# Patient Record
Sex: Female | Born: 1965
Health system: Southern US, Community
[De-identification: ages and names within clinical notes are randomized; demographics above are authoritative.]

## PROBLEM LIST (undated history)

## (undated) DIAGNOSIS — Z9989 Dependence on other enabling machines and devices: Secondary | ICD-10-CM

## (undated) DIAGNOSIS — K219 Gastro-esophageal reflux disease without esophagitis: Secondary | ICD-10-CM

## (undated) DIAGNOSIS — I82812 Embolism and thrombosis of superficial veins of left lower extremities: Secondary | ICD-10-CM

## (undated) DIAGNOSIS — I712 Thoracic aortic aneurysm, without rupture, unspecified: Secondary | ICD-10-CM

## (undated) DIAGNOSIS — I351 Nonrheumatic aortic (valve) insufficiency: Secondary | ICD-10-CM

## (undated) DIAGNOSIS — N289 Disorder of kidney and ureter, unspecified: Secondary | ICD-10-CM

## (undated) DIAGNOSIS — Z9289 Personal history of other medical treatment: Secondary | ICD-10-CM

## (undated) DIAGNOSIS — M179 Osteoarthritis of knee, unspecified: Secondary | ICD-10-CM

## (undated) DIAGNOSIS — G2581 Restless legs syndrome: Secondary | ICD-10-CM

## (undated) DIAGNOSIS — E119 Type 2 diabetes mellitus without complications: Secondary | ICD-10-CM

## (undated) DIAGNOSIS — N390 Urinary tract infection, site not specified: Secondary | ICD-10-CM

## (undated) DIAGNOSIS — R011 Cardiac murmur, unspecified: Secondary | ICD-10-CM

## (undated) DIAGNOSIS — R7301 Impaired fasting glucose: Secondary | ICD-10-CM

## (undated) DIAGNOSIS — Z86711 Personal history of pulmonary embolism: Secondary | ICD-10-CM

## (undated) DIAGNOSIS — Z87442 Personal history of urinary calculi: Secondary | ICD-10-CM

## (undated) DIAGNOSIS — M1710 Unilateral primary osteoarthritis, unspecified knee: Secondary | ICD-10-CM

## (undated) DIAGNOSIS — G4733 Obstructive sleep apnea (adult) (pediatric): Secondary | ICD-10-CM

## (undated) HISTORY — PX: KNEE SURGERY: SHX244

## (undated) HISTORY — PX: DILATION AND EVACUATION: SHX1459

## (undated) HISTORY — PX: OTHER SURGICAL HISTORY: SHX169

## (undated) HISTORY — DX: Osteoarthritis of knee, unspecified: M17.9

## (undated) HISTORY — DX: Personal history of pulmonary embolism: Z86.711

## (undated) HISTORY — DX: Thoracic aortic aneurysm, without rupture, unspecified: I71.20

## (undated) HISTORY — PX: KIDNEY STONE SURGERY: SHX686

## (undated) HISTORY — PX: TONSILLECTOMY: SUR1361

## (undated) HISTORY — DX: Unilateral primary osteoarthritis, unspecified knee: M17.10

## (undated) HISTORY — DX: Thoracic aortic aneurysm, without rupture: I71.2

## (undated) HISTORY — DX: Impaired fasting glucose: R73.01

## (undated) HISTORY — DX: Personal history of other medical treatment: Z92.89

## (undated) HISTORY — DX: Restless legs syndrome: G25.81

## (undated) HISTORY — DX: Nonrheumatic aortic (valve) insufficiency: I35.1

## (undated) HISTORY — DX: Embolism and thrombosis of superficial veins of left lower extremity: I82.812

---

## 1998-03-07 ENCOUNTER — Other Ambulatory Visit: Admission: RE | Admit: 1998-03-07 | Discharge: 1998-03-07 | Payer: Self-pay | Admitting: Obstetrics and Gynecology

## 1998-05-23 ENCOUNTER — Other Ambulatory Visit: Admission: RE | Admit: 1998-05-23 | Discharge: 1998-05-23 | Payer: Self-pay | Admitting: Obstetrics and Gynecology

## 1998-09-12 ENCOUNTER — Inpatient Hospital Stay (HOSPITAL_COMMUNITY): Admission: AD | Admit: 1998-09-12 | Discharge: 1998-09-14 | Payer: Self-pay | Admitting: Obstetrics and Gynecology

## 1998-10-22 ENCOUNTER — Other Ambulatory Visit: Admission: RE | Admit: 1998-10-22 | Discharge: 1998-10-22 | Payer: Self-pay | Admitting: Obstetrics and Gynecology

## 1999-11-12 ENCOUNTER — Other Ambulatory Visit: Admission: RE | Admit: 1999-11-12 | Discharge: 1999-11-12 | Payer: Self-pay | Admitting: Obstetrics and Gynecology

## 2000-11-26 ENCOUNTER — Other Ambulatory Visit: Admission: RE | Admit: 2000-11-26 | Discharge: 2000-11-26 | Payer: Self-pay | Admitting: Obstetrics and Gynecology

## 2001-01-12 ENCOUNTER — Ambulatory Visit (HOSPITAL_COMMUNITY): Admission: EM | Admit: 2001-01-12 | Discharge: 2001-01-13 | Payer: Self-pay | Admitting: Emergency Medicine

## 2001-01-13 ENCOUNTER — Encounter: Payer: Self-pay | Admitting: Emergency Medicine

## 2001-01-20 ENCOUNTER — Inpatient Hospital Stay (HOSPITAL_COMMUNITY): Admission: EM | Admit: 2001-01-20 | Discharge: 2001-01-21 | Payer: Self-pay

## 2001-01-20 ENCOUNTER — Encounter: Payer: Self-pay | Admitting: Internal Medicine

## 2001-01-25 ENCOUNTER — Encounter: Payer: Self-pay | Admitting: Urology

## 2001-01-25 ENCOUNTER — Ambulatory Visit (HOSPITAL_COMMUNITY): Admission: RE | Admit: 2001-01-25 | Discharge: 2001-01-25 | Payer: Self-pay | Admitting: Urology

## 2001-03-02 ENCOUNTER — Ambulatory Visit (HOSPITAL_BASED_OUTPATIENT_CLINIC_OR_DEPARTMENT_OTHER): Admission: RE | Admit: 2001-03-02 | Discharge: 2001-03-02 | Payer: Self-pay | Admitting: Family Medicine

## 2001-05-30 ENCOUNTER — Ambulatory Visit (HOSPITAL_BASED_OUTPATIENT_CLINIC_OR_DEPARTMENT_OTHER): Admission: RE | Admit: 2001-05-30 | Discharge: 2001-05-30 | Payer: Self-pay | Admitting: Family Medicine

## 2001-12-31 ENCOUNTER — Other Ambulatory Visit: Admission: RE | Admit: 2001-12-31 | Discharge: 2001-12-31 | Payer: Self-pay | Admitting: Obstetrics and Gynecology

## 2002-12-21 ENCOUNTER — Other Ambulatory Visit: Admission: RE | Admit: 2002-12-21 | Discharge: 2002-12-21 | Payer: Self-pay | Admitting: Obstetrics and Gynecology

## 2003-02-03 ENCOUNTER — Encounter (INDEPENDENT_AMBULATORY_CARE_PROVIDER_SITE_OTHER): Payer: Self-pay | Admitting: *Deleted

## 2003-02-03 ENCOUNTER — Ambulatory Visit (HOSPITAL_COMMUNITY): Admission: RE | Admit: 2003-02-03 | Discharge: 2003-02-03 | Payer: Self-pay | Admitting: Obstetrics and Gynecology

## 2005-04-23 ENCOUNTER — Other Ambulatory Visit: Admission: RE | Admit: 2005-04-23 | Discharge: 2005-04-23 | Payer: Self-pay | Admitting: Obstetrics and Gynecology

## 2005-05-19 ENCOUNTER — Encounter: Admission: RE | Admit: 2005-05-19 | Discharge: 2005-05-19 | Payer: Self-pay | Admitting: Obstetrics and Gynecology

## 2007-10-06 ENCOUNTER — Ambulatory Visit: Payer: Self-pay

## 2007-10-06 ENCOUNTER — Encounter (INDEPENDENT_AMBULATORY_CARE_PROVIDER_SITE_OTHER): Payer: Self-pay | Admitting: Family Medicine

## 2008-07-07 ENCOUNTER — Ambulatory Visit: Admission: RE | Admit: 2008-07-07 | Discharge: 2008-07-07 | Payer: Self-pay | Admitting: Family Medicine

## 2009-03-19 ENCOUNTER — Encounter: Admission: RE | Admit: 2009-03-19 | Discharge: 2009-03-19 | Payer: Self-pay | Admitting: Family Medicine

## 2010-01-10 ENCOUNTER — Encounter: Admission: RE | Admit: 2010-01-10 | Discharge: 2010-01-10 | Payer: Self-pay | Admitting: Obstetrics & Gynecology

## 2010-06-02 ENCOUNTER — Encounter: Payer: Self-pay | Admitting: Obstetrics and Gynecology

## 2010-09-27 NOTE — Op Note (Signed)
[facility]  Patient:    Erika Woods, Erika Woods Visit Number: [id number] MRN: [id number]          Service Type: EMS Location: ED Attending Physician:  Pearletha Alfred Dictated by:   Marinda Elk, M.D. Admit Date:  01/19/2001                             Operative Report  DATE OF BIRTH:  [birth year]  CHIEF COMPLAINT:  Right upper quadrant abdominal pain.  HISTORY OF PRESENT ILLNESS:  This 45 year old married white female presents with the onset about 12 noon on the day of admission of right upper quadrant pain which was crampy in nature.  She has associated nausea and vomiting at about 6:00 p.m.  She has had no constipation, diarrhea or blood in stool.  She ate a peanut butter and jelly sandwich about an hour after the onset of pain without exacerbation of the pain.  However, about 5 or 6 oclock, the pain got much worse.  She has had some fever with this and some left lower quadrant pain since arrival in the ER.  Last week, the patient had an ER visit with lower abdominal pain into her back on the left and was found to have a 9-mm ureteral calculus.  A stent was placed by Dr. Wanda Plump and she is scheduled for lithotripsy next week.  The patient states that this pain is not similar to that in character, location or associated symptoms.  The patient had a complete physical examination by her gynecologist, Dr. Arelia Sneddon in July, which included lab work which was entirely normal.  PAST MEDICAL HISTORY:  CURRENT MEDICATIONS:  Zoloft 50 mg q.d. per Dr. Arelia Sneddon.  Cipro.  Hydrocodone and a blue pill ? Ditropan this week per Dr. Wanda Plump.  ALLERGIES:  Sulfa and Penicillin.  SURGERY:  Tonsillectomy at the age of 24.  HOSPITALIZATIONS:  Vaginal deliveries in 1997 and 2000.  PAST GYNECOLOGIC HISTORY:  Gravida 2, para 2.  LMP January 11, 2001. menstrual cycles 25-30 days.  FAMILY HISTORY:  See last admission.  SOCIAL HISTORY:  See last admission.  REVIEW  OF SYSTEMS:  Snores with apneic spells noted by husband.  She has some shortness of breath, some day time drowsiness.  The remainder of the review of systems noncontributory.  PHYSICAL EXAMINATION:  VITAL SIGNS:  Temperature 100.7, pulse 48, blood pressure 147/83.  GENERAL:  Obvious distress.  Alert and oriented but uncomfortable.  HEENT:  Ears normal.  Mouth shows dry mucous membranes.  Pharynx normal.  NECK:  Supple with no carotid bruits or thyromegaly noted.  CHEST:  Clear to auscultation.  BREASTS:  Pendulous and nontender with no masses.  HEART:  Regular rate.  No murmurs, rubs, or gallops.  ABDOMEN:  Significant right upper quadrant tenderness.  None in the right lower quadrant.  Some in the left upper quadrant and left lower quadrant. Murphys sign positive.  She is morbidly obese.  RECTAL:  No hemorrhoids.  DRE revealed no masses.  EXTREMITIES:  The lower extremities allowed full range of motion.  There were no cords or tenderness in the thighs or calves.  Dorsalis pedis pulses were good at the feet.  SKIN:  No suspicious lesion, though there is intertrigo, particularly in the groin.  LABORATORY DATA:  UA shows moderate hemoglobin and small leukocyte esterase. Urine pregnancy test negative.  White blood count 10.7 with left shift, hemoglobin 13.4, platelet 289.  CMET was negative except for a sugar of 132. Lipase 15.  Amylase 34.  Chest x-ray showed some cardiomegaly.  Gallbladder ultrasound showed fatty liver and a dilated gallbladder with no stones seen, though ducts were not well visualized.  There was mild splenomegaly and no hydronephrosis.  ASSESSMENT: 1. Abdominal pain, still think cholecystitis most likely by clinical picture. 2. Left ureteral lithiasis with stent in place and lithotripsy planned. 3. Morbid obesity. 4. Fatty liver infiltration. 5. Splenomegaly of questionable significance. 6. Probable obstructive sleep apnea with resultant  cardiomegaly.  PLAN:  I gave the patient morphine in the ED, which should relieve her pain better than the Demerol which the ER P.A. had given her.  However, she became very drowsy and I actually found her with abnormal breathing and O2 saturation down to 86%.  I spoke with Dr. Madilyn Fireman regarding workup for abdominal pain and, after reviewing the ultrasound myself, I think PIPIDA scan would be he next step.  We will, therefore, have to hold off on narcotics for eight hours and use Toradol for pain.  If the PIPIDA is negative, will need to consider CT and/or EGD, probably requiring GI consult at that time.  If PIPIDA scan negative, a surgical consult would be indicated.  The patient may warrant outpatient workup for sleep apnea syndrome.  Obviously, weight loss would be highly desirable in this patient. Dictated by:   Marinda Elk, M.D. Attending Physician:  Susy [place] B DD:  01/20/01 TD:  01/20/01 Job: [id number]

## 2010-09-27 NOTE — Op Note (Signed)
NAME:  Erika Woods, Erika Woods                          ACCOUNT NO.:  [id number]   MEDICAL RECORD NO.:  [id number]                   PATIENT TYPE:  AMB   LOCATION:  SDC                                  FACILITY:  [facility]   PHYSICIAN:  Juluis Mire, M.D.                DATE OF BIRTH:  1966/02/05   DATE OF PROCEDURE:  02/03/2003  DATE OF DISCHARGE:  02/03/2003                                 OPERATIVE REPORT   PREOPERATIVE DIAGNOSIS:  Nonviable pregnancy at 14 weeks.   POSTOPERATIVE DIAGNOSIS:  Nonviable pregnancy at 14 weeks.   OPERATIVE PROCEDURE:  Cervical dilatation and evacuation of the uterus.   SURGEON:  Juluis Mire, M.D.   ANESTHESIA:  General.   ESTIMATED BLOOD LOSS:  Probably 200 mL.   PACKS AND DRAINS:  None.   BLOOD REPLACED:  None.   COMPLICATIONS:  None.   INDICATIONS:  Dictated in the history and physical.   DESCRIPTION OF PROCEDURE:  The patient was taken to the OR and placed in the  supine position.  After a satisfactory level of general anesthesia obtained,  the patient was placed in the dorsal lithotomy position using the Allen  stirrups.  The lower perineum and vagina were prepped out with Betadine and  draped in a sterile field.  Exam under anesthesia revealed the uterus to be  midposition, approximately 14 weeks in size.  A speculum was placed in the  vaginal vault, the cervix grasped with a tenaculum, the cervix sterilely  dilated to a size 40 Pratt dilator.  A size 10 suction curette was first  introduced.  We were able to remove some tissue with this.  We then moved up  to a size 12 suction curette.  With this, additional tissue was obtained.  We then went in with forceps and removing various tissue.  This was  continued alternating between the graspers and suction curetting until we  felt like most of the intrauterine cavity had been emptied.  We then started  Pitocin.  We started doing sharp curetting.  There was some additional  placental tissue  obtained at this time.  This was also removed with suction  curette.  At this point in time all quadrants were felt to be clear, the  uterus was contracting down well, and there was no active bleeding.  There  was a small rent in the cervix from the tenaculum.  It was closed with a  figure-of-eight of 3-0 chromic.  At this point in time the uterus was  contracting down well with no active vaginal bleeding.  The speculum and  single-tooth  tenaculum were then removed, the patient taken out of the dorsal lithotomy  position once alert and transferred to the recovery room in good condition.  The sponge, instrument, and needle count were reported as correct by the  circulating nurse.  The patient's blood type is A positive.  Rho GAM will  not be required.                                               Juluis Mire, M.D.    JSM/MEDQ  D:  02/03/2003  T:  02/06/2003  Job:  [id number]

## 2010-09-27 NOTE — H&P (Signed)
NAME:  Erika, [name]:  [id number]   MEDICAL RECORD NO.:  [id number]                   PATIENT TYPE:  AMB   LOCATION:  SDC                                  FACILITY:  [facility]   PHYSICIAN:  Juluis Mire, M.D.                DATE OF BIRTH:  1965-07-26   DATE OF ADMISSION:  02/03/2003  DATE OF DISCHARGE:                                HISTORY & PHYSICAL   HISTORY OF PRESENT ILLNESS:  The patient is a 45 year old gravida 3 para 2  abortus 1 married white female with last menstrual period of May 26 giving  her an estimated date of confinement of July 14, 2002 which gives her an  estimated gestational age of approximately 16 weeks.   The patient was seen yesterday for routine obstetrical care and ultrasound.  Ultrasound revealed a 14-week-size fetus with no fetal heart tone.  There  were signs of a cystic hygroma.  She was at risk for advanced maternal age.  Presumptively we are dealing with a genetic issue.  The patient now presents  for dilatation and evacuation for management.   ALLERGIES:  The patient is allergic to SULFA and PENICILLIN.   MEDICATIONS:  Prenatal vitamins.   PAST MEDICAL HISTORY:  Usual childhood diseases without any significant  sequelae.   PAST SURGICAL HISTORY:  She has had a previous tonsillectomy.  She has also  had lithotripsy.   OBSTETRICAL HISTORY:  She has had two vaginal deliveries.   FAMILY HISTORY:  Noncontributory.   SOCIAL HISTORY:  No tobacco or alcohol use.   REVIEW OF SYSTEMS:  Noncontributory.   PHYSICAL EXAMINATION:  VITAL SIGNS:  The patient is afebrile with stable  vital signs.  HEENT:  The patient is normocephalic.  Pupils equal, round, and reactive to  light and accomodation.  Extraocular movements were intact.  Sclerae and  conjunctivae were clear, oropharynx clear.  NECK:  Without thyromegaly.  BREASTS:  No discrete masses.  LUNGS:  Clear.  CARDIOVASCULAR:  Regular rhythm and rate with a grade  2/6 systolic ejection  murmur.  No clicks or gallops.  ABDOMEN:  Benign.  No masses, organomegaly, or tenderness.  Difficult to  palpate uterus due to the patient's obesity.  PELVIC:  Cervix closed.  Uterus approximately 14 weeks in size.  Adnexa  unremarkable.  EXTREMITIES:  Trace edema.  NEUROLOGIC:  Grossly within normal limits.   IMPRESSION:  Nonviable second trimester pregnancy.   PLAN:  The patient will undergo dilatation and evacuation.  The risks have  been discussed including the risk of infection; the risk of hemorrhage that  could necessitate transfusion with the risk of AIDS or hepatitis or possible  hysterectomy; the risk of perforation that could lead to injury to adjacent  organs including bladder or bowel that could require further exploratory  surgery for management; the risk of deep venous thrombosis and pulmonary  embolus.  The patient professed an understanding of the indications and  risks.                                               Juluis Mire, M.D.    JSM/MEDQ  D:  02/03/2003  T:  02/03/2003  Job:  [id number]

## 2010-09-27 NOTE — Op Note (Signed)
[facility]  Patient:    Erika Woods, Erika Woods Visit Number: [id number] MRN: [id number]          Service Type: EMS Location: ED Attending Physician:  Cathren Laine Proc. Date: 01/13/01 Admit Date:  01/12/2001                             Operative Report  DATE OF BIRTH:  27-May-1965  REFERRING PHYSICIAN:  [facility], Juluis Mire, M.D.  UROLOGIST:  Verl Dicker, M.D.  PREOPERATIVE DIAGNOSIS:  9 mm calculus left proximal ureter.  POSTOPERATIVE DIAGNOSIS:  9 mm calculus left proximal ureter.  PROCEDURE:  Cystoscopy retrograde and left double J stent placement.  ANESTHESIA:  General.  DRAINS:  6 French 24 cm left double J stent.  DESCRIPTION OF PROCEDURE:  The patient was prepped and draped in the dorsal lithotomy position after institution of an adequate level of general anesthesia. A well lubricated 21 French panendoscope was gently inserted at the urethral meatus. Normal urethral and sphincter. Normal trigone and orifices. The bladder showed no obvious evidence of tumor, stone, bleeding site or other anatomic abnormality. Right retrograde showed normal course and caliber of the ureter, pelvis, and calices with prompt drainage at 3-5 minutes. Left retrograde showed obstruction within the left proximal ureter due to the patients body habitus (over 300 pounds). Some difficulty was encountered visualizing the stone despite its size (9 mm). With an additional injection of contrast, a 9 mm filling defect was identified within the proximal left ureter. The stone appeared to have moved back into the kidney but it was difficult to be certain on fluoroscopic images. A short 6.5 French ureteroscope was then inserted at the left ureteral orifice along side the patients indwelling guidewire. The ureter was carefully inspected. An area of mild erythema where the stone had come to rest was easily visualized. The stone however had migrated  back into the left renal pelvis. The ureteroscope was withdrawn and a 6 French 24 cm double J stent was passed over the indwelling guidewire with excellent pigtail formation on guidewire removal. The patients bladder was then drained and she was returned to recovery in satisfactory condition. Attending Physician:  Cathren Laine DD:  01/13/01 TD:  01/13/01 Job: [id number]

## 2010-09-27 NOTE — H&P (Signed)
[facility]  Patient:    Erika Woods, HUND Visit Number: [id number] MRN: [id number]          Service Type: EMS Location: ED Attending Physician:  Cathren Laine Dictated by:   Verl Dicker, M.D. Admit Date:  01/12/2001                           History and Physical  DATE OF BIRTH:  01/20/1966  REFERRING PHYSICIANS: 1. Willis Modena. Dreiling, M.D., [facility]. 2. Juluis Mire, M.D.  UROLOGIST:  Verl Dicker, M.D.  PREOPERATIVE DIAGNOSIS:  Nine-millimeter left ureteropelvic junction stone.  INDICATIONS:  Patient has no personal or family history of urolithiasis and was in her usual state of good health until 10 p.m., January 12, 2001, noticed onset of left CVA tenderness followed by nausea and vomiting x 2. Patient denies gross hematuria or fever.  Taken by her husband to the [facility] Emergency Room.  Point #2:  CT scan and thorough evaluation with Dr. Earlyne Iba confirmed a 9-mm calculus within the proximal left ureter with marked hydronephrosis.  I have discussed options with patient and family members, have reviewed with them the risks and benefits of open surgery, conservative therapy, nephrostomy, double J stent, ureteroscopy and stone manipulation; they agree to proceed with double J stent placement with probable ESWL to follow.  MEDICATIONS:  Zoloft.  ALLERGIES:  SULFA.  HABITS:  Tobacco:  Denies.  ETOH:  Denies.  PAST MEDICAL HISTORY:  Patient was adopted.  G2, P2, A0, both pregnancies epidural.  Patient has no personal history of stones.  PHYSICAL EXAMINATION:  GENERAL:  Patients best estimate is that her weight is "over 300 pounds."  VITAL SIGNS:  Temperature 98, respirations 32, pulse 77, BP 150/91.  HEAD AND NECK:  Negative adenopathy.  Negative bruit.  LUNGS:  Clear to P&A.  HEART:  Regular rate and rhythm without murmur or gallop.  ABDOMEN:  Soft, protuberant, positive bowel sound, with  guarding along the left abdomen consistent with left CVA tenderness.  GU:  Left CVA tenderness with radiation to the left lower quadrant.  NEUROLOGIC:  Exam is grossly intact.  IMPRESSION:  Nine-millimeter calculus in the proximal left ureter.  I have reviewed with patient the risks and benefits of therapy.  They agree to proceed with double J stent later this a.m. Dictated by:   Verl Dicker, M.D. Attending Physician:  Cathren Laine DD:  01/13/01 TD:  01/13/01 Job: [id number]

## 2010-10-15 ENCOUNTER — Ambulatory Visit (HOSPITAL_BASED_OUTPATIENT_CLINIC_OR_DEPARTMENT_OTHER)
Admission: RE | Admit: 2010-10-15 | Discharge: 2010-10-15 | Disposition: A | Discharge status: Home / Self Care | Payer: Self-pay | Source: Ambulatory Visit | Attending: Plastic Surgery | Admitting: Plastic Surgery

## 2010-10-15 DIAGNOSIS — Z7901 Long term (current) use of anticoagulants: Secondary | ICD-10-CM | POA: Insufficient documentation

## 2010-10-15 DIAGNOSIS — Z7902 Long term (current) use of antithrombotics/antiplatelets: Secondary | ICD-10-CM | POA: Insufficient documentation

## 2010-10-15 DIAGNOSIS — L909 Atrophic disorder of skin, unspecified: Secondary | ICD-10-CM | POA: Insufficient documentation

## 2010-10-15 DIAGNOSIS — L919 Hypertrophic disorder of the skin, unspecified: Secondary | ICD-10-CM | POA: Insufficient documentation

## 2010-10-15 DIAGNOSIS — G4730 Sleep apnea, unspecified: Secondary | ICD-10-CM | POA: Insufficient documentation

## 2010-10-15 DIAGNOSIS — R9431 Abnormal electrocardiogram [ECG] [EKG]: Secondary | ICD-10-CM | POA: Insufficient documentation

## 2010-10-15 LAB — GLUCOSE, CAPILLARY

## 2010-10-15 LAB — POCT I-STAT 4, (NA,K, GLUC, HGB,HCT)
HCT: 39 % (ref 36.0–46.0)
Sodium: 143 mEq/L (ref 135–145)

## 2010-11-18 NOTE — Op Note (Signed)
[name], [name]:  [id number]  MEDICAL RECORD NO.:  [id number]  LOCATION:                                 FACILITY:  PHYSICIAN:  Etter Sjogren, M.D.          DATE OF BIRTH:  DATE OF PROCEDURE:  10/15/2010 DATE OF DISCHARGE:                              OPERATIVE REPORT   PREOPERATIVE DIAGNOSIS:  Excess skin of both upper arms.  POSTOPERATIVE DIAGNOSIS:  Excess skin of both upper arms.  PROCEDURE PERFORMED:  Bilateral brachioplasty.  SURGEON:  Etter Sjogren, M.D.  ANESTHESIA:  General.  ESTIMATED BLOOD LOSS:  200 mL.  DRAINS:  One 19-French drain left on each side.  CLINICAL NOTE:  A 45 year old woman presented with 130-pound weight loss, intentional.  She complained of excess skin of her upper arms with bat-wing type of deformity did extend to the axilla and down to the upper part of her lateral chest.  The nature of the procedure was discussed including the scar and the location of scar, length of scar, Z- plasty in the axilla, and risks and possible complications discussed included but not limited to skin loss, damage to nerves and blood vessels, bleeding, infection, anesthesia complications, healing, scarring, fluid accumulation, loss of sensation, asymmetry, disappointment, recurrence of laxity, and she understood all of this and decreased range of motion for her arms, and chronic pain as well as significant scarring that is permanent and she understood all of this and wished to proceed.  DESCRIPTION OF PROCEDURE:  The patient was marked in the holding area and then taken to the operating room and placed supine.  After successful administration of general anesthesia, she was prepped with Betadine and draped with sterile drapes including impervious stockinettes for hands and forearms.  The incision was then made at the biceps groove and this was continued down into the axilla, down to the lateral chest.  The skin flap was then undermined  in a posterior direction to take great care to avoid damage to underlying deep vessels including the cephalic vein and antecubital vein and the dissection was continued back to the area of markings that are in place and then the skin flap was then bivalved and a check was made with a 0 PDS suture to make sure that this closure to be achieved at this level.  If that being the case, the amputation was performed both at the excess skin, both inferior and superior to this stay suture.  Thorough irrigation with saline and meticulous hemostasis with electrocautery.  A 19-French drain was brought through separate stab wound inferiorly and secured with 3-0 Prolene suture and was left along the length of wound and the skin edges had good color and bright red bleeding along the periphery consistent with viability.  The closure with 0 and 2-0 PDS interrupted deep sutures and 3-0 Monocryl interrupted deep dermal sutures and running 3-0 Monocryl subcuticular suture.  Again, the skin edges all had excellent color and had bright red bleeding along the periphery consistent with viability.  Z-plasties were then designed in the axilla, approximately 2 cm limbs on the Z-plasties, one on each side.  The incision was  made and the flaps elevated and transposed and again hemostasis with electrocautery and the flaps were then set with 4-0 Prolene simple interrupted sutures and these flaps had excellent color and also had bright red bleeding along the periphery consistent with viability. Antibiotic ointment applied around the drains.  Steri-Strips and dry sterile dressings were positioned and secured and she was transferred to the recovery room in stable and tolerated the procedure well.  DISPOSITION:  She will be discharged home, pending anesthesia approval given her sleep apnea.  She does understand that it is mandatory that she use CPAP machine whenever she even takes a nap at home as well as when she is  sleeping at night.  She will follow up in the office early next week.     Etter Sjogren, M.D.     DB/MEDQ  D:  10/15/2010  T:  10/15/2010  Job:  [id number]  Electronically Signed by Etter Sjogren M.D. on 11/18/2010 47:82:95 AM

## 2011-08-18 ENCOUNTER — Telehealth: Payer: Self-pay

## 2011-08-18 ENCOUNTER — Ambulatory Visit (INDEPENDENT_AMBULATORY_CARE_PROVIDER_SITE_OTHER): Payer: BC Managed Care – PPO | Admitting: Family Medicine

## 2011-08-18 ENCOUNTER — Encounter: Payer: Self-pay | Admitting: Family Medicine

## 2011-08-18 VITALS — BP 92/59 | HR 55 | Temp 97.5°F | Resp 16 | Ht 64.0 in | Wt 194.2 lb

## 2011-08-18 DIAGNOSIS — G4730 Sleep apnea, unspecified: Secondary | ICD-10-CM

## 2011-08-18 DIAGNOSIS — E119 Type 2 diabetes mellitus without complications: Secondary | ICD-10-CM

## 2011-08-18 DIAGNOSIS — F4310 Post-traumatic stress disorder, unspecified: Secondary | ICD-10-CM

## 2011-08-18 DIAGNOSIS — F39 Unspecified mood [affective] disorder: Secondary | ICD-10-CM

## 2011-08-18 DIAGNOSIS — F411 Generalized anxiety disorder: Secondary | ICD-10-CM

## 2011-08-18 HISTORY — DX: Sleep apnea, unspecified: G47.30

## 2011-08-18 HISTORY — DX: Type 2 diabetes mellitus without complications: E11.9

## 2011-08-18 HISTORY — DX: Post-traumatic stress disorder, unspecified: F43.10

## 2011-08-18 HISTORY — DX: Unspecified mood (affective) disorder: F39

## 2011-08-18 LAB — LIPID PANEL
Cholesterol: 175 mg/dL (ref 0–200)
Triglycerides: 58 mg/dL (ref <150)
VLDL: 12 mg/dL (ref 0–40)

## 2011-08-18 LAB — COMPREHENSIVE METABOLIC PANEL
Albumin: 3.7 g/dL (ref 3.5–5.2)
BUN: 18 mg/dL (ref 6–23)
CO2: 30 mEq/L (ref 19–32)
Calcium: 9.1 mg/dL (ref 8.4–10.5)
Chloride: 105 mEq/L (ref 96–112)
Creat: 0.91 mg/dL (ref 0.50–1.10)
Glucose, Bld: 78 mg/dL (ref 70–99)
Potassium: 4.6 mEq/L (ref 3.5–5.3)

## 2011-08-18 LAB — POCT GLYCOSYLATED HEMOGLOBIN (HGB A1C): Hemoglobin A1C: 4.5

## 2011-08-18 LAB — TSH: TSH: 1.407 u[IU]/mL (ref 0.350–4.500)

## 2011-08-18 MED ORDER — METFORMIN HCL ER 500 MG PO TB24: 500.0000 mg | ORAL_TABLET | Freq: Every day | ORAL | Status: AC

## 2011-08-18 MED ORDER — CLONAZEPAM 1 MG PO TABS: 1.0000 mg | ORAL_TABLET | Freq: Two times a day (BID) | ORAL | Status: DC | PRN

## 2011-08-18 MED ORDER — METFORMIN HCL ER 500 MG PO TB24: 500.0000 mg | ORAL_TABLET | Freq: Every day | ORAL | Status: DC

## 2011-08-18 MED ORDER — CITALOPRAM HYDROBROMIDE 40 MG PO TABS: ORAL_TABLET | ORAL | Status: DC
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
## 2011-08-18 NOTE — Telephone Encounter (Signed)
Gave pt message from Dr Hal Hope as written on her OV notes and results of A1C to clarify bc pt could not hear message clearly on the VM left. Pt agreed

## 2011-08-18 NOTE — Telephone Encounter (Signed)
Pt received message on phone from Dr Hal Hope she states she didn't hear the whole message and could someone call her back with instructions as to what the message said about taking her metophromin

## 2011-08-20 ENCOUNTER — Telehealth: Payer: Self-pay

## 2011-08-20 NOTE — Telephone Encounter (Signed)
PRIOR AUTHORIZATION WAS SENT ON Monday  PHARMACY [facility] [address] HAS HAD NO REPLY  PATIENT IN NEED OF MEDICATION THAT DR. Hal Hope PRESCRIBED

## 2011-08-22 NOTE — Telephone Encounter (Signed)
Pt calling again about her celexa. States rx for 60mg  and pharmacy recommended 40mg  instead. Pt states pharmacy sent over request and has not heard back, also pt wants changed to the 40mg . Uses [facility] [address].  Pt best: [phone]  bf

## 2011-08-22 NOTE — Telephone Encounter (Signed)
Called [facility] at number provided by pharmacy and was told Rx does not req PA. The pharmacy just needs to call the pharmacy help desk and they will help them run the Rx through. Faxed this info back to pharmacy and notified pt what needs to be done. She will contact her pharmacy later.

## 2011-08-26 ENCOUNTER — Telehealth: Payer: Self-pay

## 2011-08-26 NOTE — Telephone Encounter (Signed)
[facility] pharmacy called stating that they wanted to confirm that MD wanted patient to take 60mg  of Celexa daily.  Per OV, yes that is correct dose.  They will run this through with [facility] and notify patient.

## 2011-08-26 NOTE — Telephone Encounter (Signed)
Pt has been by several times to pick up her medication she was seen on the 08-18-11 and pharmacy states nothing has been sent into pharmacy.

## 2011-09-02 ENCOUNTER — Telehealth: Payer: Self-pay

## 2011-09-02 NOTE — Telephone Encounter (Signed)
PT SAW DR. Hal Hope COUPLE OF WEEKS AGO AND LABS WERE ORDERED.  SHE WOULD LIKE ALL RESULTS, BLOOD SUGAR,CHOLESTROL, ETC.  PLEASE CALL AND MAY LEAVE A MESSAGE ON HER CELL PHONE THE RESULTS.  SHE CANNOT ALWAYS ANSWER AT WORK.

## 2011-09-02 NOTE — Telephone Encounter (Signed)
LMOM on machine that lab letter was sent to her home.  She should receive it today or tomorrow.

## 2012-02-05 ENCOUNTER — Other Ambulatory Visit: Payer: Self-pay | Admitting: Obstetrics and Gynecology

## 2012-02-16 ENCOUNTER — Ambulatory Visit: Payer: BC Managed Care – PPO | Admitting: Family Medicine

## 2013-02-19 ENCOUNTER — Emergency Department (HOSPITAL_COMMUNITY)
Admission: EM | Admit: 2013-02-19 | Discharge: 2013-02-19 | Disposition: A | Discharge status: Home / Self Care | Payer: BC Managed Care – PPO | Attending: Emergency Medicine | Admitting: Emergency Medicine

## 2013-02-19 ENCOUNTER — Encounter (HOSPITAL_COMMUNITY): Payer: Self-pay | Admitting: Emergency Medicine

## 2013-02-19 DIAGNOSIS — M79609 Pain in unspecified limb: Secondary | ICD-10-CM | POA: Insufficient documentation

## 2013-02-19 DIAGNOSIS — E119 Type 2 diabetes mellitus without complications: Secondary | ICD-10-CM | POA: Insufficient documentation

## 2013-02-19 DIAGNOSIS — M79661 Pain in right lower leg: Secondary | ICD-10-CM

## 2013-02-19 DIAGNOSIS — Z882 Allergy status to sulfonamides status: Secondary | ICD-10-CM | POA: Insufficient documentation

## 2013-02-19 DIAGNOSIS — Z79899 Other long term (current) drug therapy: Secondary | ICD-10-CM | POA: Insufficient documentation

## 2013-02-19 HISTORY — DX: Type 2 diabetes mellitus without complications: E11.9

## 2013-02-19 LAB — POCT I-STAT, CHEM 8
Chloride: 104 mEq/L (ref 96–112)
HCT: 36 % (ref 36.0–46.0)
Hemoglobin: 12.2 g/dL (ref 12.0–15.0)
Potassium: 4.1 mEq/L (ref 3.5–5.1)

## 2013-02-19 NOTE — ED Notes (Signed)
Pt c/o right calf pain X 4 days, sts it "feels like cramping", has tried stretching/massaging no relief with either. Called PCP they suggested she come get a vascular duplex to rule out DVT. Pt denies hx of DVT. No new swelling to area or right leg. Pt sts sometimes she thinks the back of her calf feels warm. Pt in nad, skin warm and dry, resp e/u.

## 2013-02-19 NOTE — ED Notes (Addendum)
Pt c/o right calf pain onset Wednesday while ambulating. Pt denies recent injury. Pt called her Dr and was told to come to ED to R/O blood clot. Pt reports area warm to touch. Pain increases with ambulation.

## 2013-02-19 NOTE — ED Provider Notes (Signed)
CSN: [id number]     Arrival date & time 02/19/13  1629 History   First MD Initiated Contact with Patient 02/19/13 1651     Chief Complaint  Patient presents with  . Leg Pain    Right calf since Wednesday   (Consider location/radiation/quality/duration/timing/severity/associated sxs/prior Treatment) HPI Comments: Pt states that she thought there was warmth and some swelling to the back of her leg:pt was sent by pcp for doppler:pt denies cp or sob:has a history of varicosities to the left leg  Patient is a 47 y.o. female presenting with leg pain. The history is provided by the patient. No language interpreter was used.  Leg Pain Location:  Leg Time since incident:  4 days Injury: no   Leg location:  R lower leg Pain details:    Quality:  Aching   Radiates to:  Does not radiate   Severity:  Moderate   Onset quality:  Sudden   Timing:  Constant Chronicity:  New Dislocation: no   Foreign body present:  No foreign bodies Prior injury to area:  No Relieved by:  Nothing Worsened by:  Bearing weight Ineffective treatments:  NSAIDs Associated symptoms: no muscle weakness, no neck pain, no stiffness and no swelling     Past Medical History  Diagnosis Date  . Diabetes mellitus without complication    Past Surgical History  Procedure Laterality Date  . Tonsillectomy    . Kidney stone surgery    . Dilation and evacuation    . Arm surgery Bilateral Skin removal   No family history on file. History  Substance Use Topics  . Smoking status: Never Smoker   . Smokeless tobacco: Not on file  . Alcohol Use: Yes     Comment: special occassional - mixed drinks   OB History   Grav Para Term Preterm Abortions TAB SAB Ect Mult Living                 Review of Systems  Constitutional: Negative.   Respiratory: Negative.   Cardiovascular: Negative.   Musculoskeletal: Negative for neck pain and stiffness.    Allergies  Sulfa antibiotics  Home Medications   Current Outpatient Rx   Name  Route  Sig  Dispense  Refill  . ARIPiprazole (ABILIFY) 10 MG tablet   Oral   Take 10 mg by mouth daily.         . citalopram (CELEXA) 40 MG tablet      1 1/2 po daily   180 tablet   2   . Multiple Vitamin (MULTIVITAMIN) tablet   Oral   Take 1 tablet by mouth daily.          BP 118/72  Pulse 62  Temp(Src) 98.5 F (36.9 C) (Oral)  Resp 16  Ht 5\' 5"  (1.651 m)  Wt 240 lb (108.863 kg)  BMI 39.94 kg/m2  SpO2 97%  LMP 01/29/2013 Physical Exam  Nursing note and vitals reviewed. Constitutional: She appears well-developed and well-nourished.  Cardiovascular: Normal rate and regular rhythm.   Pulmonary/Chest: Breath sounds normal.  Abdominal: Soft. Bowel sounds are normal.  Musculoskeletal: Normal range of motion.  Warmth noted to there right posterior calf:no gross swelling noted:pt has full WUJ:WJXBJY intact:no redness noted:vericosities noted  Neurological: She is alert.  Skin: Skin is warm and dry.    ED Course  Procedures (including critical care time) Labs Review Labs Reviewed - No data to display Imaging Review No results found.  EKG Interpretation   None  MDM   1. Calf pain, right    Doppler negative for ZOX:[id number] wnl:pt denies need for pain medication:pt can follow up with pcp   Teressa Lower, NP 02/19/13 1911

## 2013-02-19 NOTE — ED Notes (Signed)
Pt given gown and told to undress from waste down for further evaluation.

## 2013-02-19 NOTE — Progress Notes (Signed)
*  PRELIMINARY RESULTS* Vascular Ultrasound Right lower extremity venous duplex has been completed.  Preliminary findings: negative for DVT   Erika Woods, Erika Woods 02/19/2013, 6:06 PM

## 2013-02-25 NOTE — ED Provider Notes (Signed)
Medical screening examination/treatment/procedure(s) were performed by non-physician practitioner and as supervising physician I was immediately available for consultation/collaboration.   Nelia Shi, MD 02/25/13 [phone]

## 2014-03-23 ENCOUNTER — Other Ambulatory Visit: Payer: Self-pay | Admitting: Obstetrics and Gynecology

## 2014-03-24 LAB — CYTOLOGY - PAP

## 2014-03-27 ENCOUNTER — Other Ambulatory Visit: Payer: Self-pay | Admitting: Obstetrics and Gynecology

## 2014-03-27 DIAGNOSIS — R928 Other abnormal and inconclusive findings on diagnostic imaging of breast: Secondary | ICD-10-CM

## 2014-04-12 ENCOUNTER — Other Ambulatory Visit: Payer: BC Managed Care – PPO

## 2014-04-12 ENCOUNTER — Ambulatory Visit
Admission: RE | Admit: 2014-04-12 | Discharge: 2014-04-12 | Disposition: A | Discharge status: Home / Self Care | Payer: BC Managed Care – PPO | Source: Ambulatory Visit | Attending: Obstetrics and Gynecology | Admitting: Obstetrics and Gynecology

## 2014-04-12 DIAGNOSIS — R928 Other abnormal and inconclusive findings on diagnostic imaging of breast: Secondary | ICD-10-CM

## 2015-08-27 ENCOUNTER — Encounter: Payer: Self-pay | Admitting: Sports Medicine

## 2015-08-27 ENCOUNTER — Ambulatory Visit (INDEPENDENT_AMBULATORY_CARE_PROVIDER_SITE_OTHER): Payer: Managed Care, Other (non HMO) | Admitting: Sports Medicine

## 2015-08-27 VITALS — BP 121/73 | HR 87 | Ht 65.0 in | Wt 298.0 lb

## 2015-08-27 DIAGNOSIS — M7711 Lateral epicondylitis, right elbow: Secondary | ICD-10-CM | POA: Diagnosis not present

## 2015-08-27 MED ORDER — DICLOFENAC SODIUM 75 MG PO TBEC: DELAYED_RELEASE_TABLET | ORAL | Status: DC

## 2015-08-27 NOTE — Progress Notes (Signed)
   Subjective:    Patient ID: Erika Woods, female    DOB: [birth year], 50 y.o.   MRN: [id number]  HPI chief complaint: Right elbow pain  Very pleasant 50 year old right-hand-dominant female comes in today complaining of 3 months of lateral right elbow pain. 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 Pain at times will radiate into her forearm. It is most noticeable with picking up heavy objects. She has similar pain in the left elbow as well but not as severe. She has also begun to experience some diffuse right shoulder pain over the past month. No numbness or tingling.  Past medical history reviewed Medications reviewed Allergies reviewed    Review of Systems    as above Objective:   Physical Exam  Obese. No acute distress. Vital signs reviewed  Right elbow: Full range of motion. No effusion. No soft tissue swelling. She is tender to palpation over the lateral epicondyle but also has some tenderness over the lateral aspect of the olecranon. There is no obvious olecranon bursitis however. She does have reproducible pain with resisted ECRB testing. Good grip strength.      Assessment & Plan:   Right elbow pain likely secondary to lateral epicondylitis versus small olecranon bursitis  Since she is having pain in her right elbow and her right shoulder (she also mentioned pain in the dorsum of her left foot), I've elected to place her on Voltaren 75 mg twice daily for the next 10 days. I also have given her a wrist brace to wear with activity on the right. I have highly recommended an ergonomic evaluation of her work space as I do believe that this is likely the reason for her bilateral elbow pain. She will follow-up with me in 3 weeks for reevaluation. If symptoms persist or worsen, consider imaging at that time. Call with questions or concerns in the interim.

## 2015-08-31 ENCOUNTER — Ambulatory Visit: Payer: Self-pay | Admitting: Sports Medicine

## 2015-09-13 ENCOUNTER — Encounter: Payer: Self-pay | Admitting: Sports Medicine

## 2015-09-13 ENCOUNTER — Ambulatory Visit (INDEPENDENT_AMBULATORY_CARE_PROVIDER_SITE_OTHER): Payer: Managed Care, Other (non HMO) | Admitting: Sports Medicine

## 2015-09-13 VITALS — BP 123/76 | Ht 65.0 in | Wt 295.0 lb

## 2015-09-13 DIAGNOSIS — M7711 Lateral epicondylitis, right elbow: Secondary | ICD-10-CM | POA: Diagnosis not present

## 2015-09-13 NOTE — Progress Notes (Signed)
   Subjective:    Patient ID: Erika Woods, female    DOB: [birth year], 50 y.o.   MRN: [id number]  HPI Erika Woods is a 50 y/o right hand dominant female following up for lateral right elbow/shoulder pain thought to be secondary to lateral epicondylitis versus small olecranon bursitis. Since her last appt, her right shoulder pain is completely gone and her right elbow pain has improved significantly. She still sometimes notes pain with movement and with palpation, but not nearly as frequent or severe.  No swelling noted. She's still taking Voltaren 75mg  BID as she's worried the pain may return.  She wore the right wrist brace for 2 weeks and it improved her symptoms. She stopped 1 week ago as she felt like she was not getting any more benefit from it.  She never had an ergonomic evaluation of her work station but notes she'll only be at that job for 3 more weeks.     Review of Systems negative besides that noted in HPI and left dorsal foot pain that has improved after weight a short boot (now wearing normal tennis shoes x 3 days without issues).      Objective:   Physical Exam  Blood pressure 123/76, height 5\' 5"  (1.651 m), weight 295 lb (133.811 kg).  Obese, pleasant, in NAD.   Right elbow: No erythema, effusions noted. Full range of motion. Mild tenderness to palpation over the lateral epicondyle.  There is no obvious olecranon bursitis . She does have mild reproducible pain with resisted ECRB testing. Good grip strength. Neurovascularly intact distally.      Assessment & Plan:  Right elbow pain likely secondary to lateral epicondylitis with complete resolution of shoulder pain.   Discussed transitioning Voltaren 75 mg to PRN given side effects and I'd like to see if her symptoms return. Discontinue wrist brace for now, may start wearing it again if she has more pain. Given that she ends her job in 3 weeks, I do not feel an ergonomic evaluation of her work space is necessary however I do  feel this is a significant contributor as she's now having left lateral epicondyle pain. Patient will follow up as needed. If symptoms worsen, could consider a trial of PT.  Joanna Puffrystal S. Dorsey, MD [facility] Resident  09/13/2015, 9:21 AM   Patient seen and evaluated with the resident. I agree with the above plan of care. Patient's elbow pain is improving. Shoulder pain is completely resolved. I've asked her to wean from her Voltaren. She did not get an ergonomic evaluation of her workstation but she will only be at this part-time job for 3 more weeks. I would look for her symptoms to continue to improve to the point of resolution, especially after she stops this part-time job. She will let me know if that is not the case (at which point we can discuss a trial of physical therapy). Patient will follow-up for ongoing or recalcitrant issues. Of note, her foot pain has resolved as well. If this returns, she will notify me and I'll start with getting a plain x-ray to rule out a metatarsal stress fracture.

## 2015-09-20 ENCOUNTER — Other Ambulatory Visit: Payer: Self-pay | Admitting: *Deleted

## 2015-09-20 MED ORDER — DICLOFENAC SODIUM 75 MG PO TBEC: DELAYED_RELEASE_TABLET | ORAL | Status: DC

## 2015-09-24 ENCOUNTER — Encounter: Payer: Self-pay | Admitting: Sports Medicine

## 2015-09-24 ENCOUNTER — Ambulatory Visit
Admission: RE | Admit: 2015-09-24 | Discharge: 2015-09-24 | Disposition: A | Discharge status: Home / Self Care | Payer: Managed Care, Other (non HMO) | Source: Ambulatory Visit | Attending: Sports Medicine | Admitting: Sports Medicine

## 2015-09-24 ENCOUNTER — Ambulatory Visit (INDEPENDENT_AMBULATORY_CARE_PROVIDER_SITE_OTHER): Payer: Managed Care, Other (non HMO) | Admitting: Sports Medicine

## 2015-09-24 VITALS — BP 119/75 | HR 79 | Ht 65.0 in | Wt 296.0 lb

## 2015-09-24 DIAGNOSIS — M2550 Pain in unspecified joint: Secondary | ICD-10-CM

## 2015-09-24 NOTE — Progress Notes (Signed)
   Subjective:    Patient ID: Erika Woods, female    DOB: January 31, 1966, 50 y.o.   MRN: [id number]  HPI   Patient comes in today complaining of left elbow pain, right hip pain, left foot pain, and hand stiffness  Patient comes in today complaining of lateral left elbow pain. She was recently treated for lateral epicondylitis in the right elbow and that pain has improved. She is now experiencing similar but more painful symptoms in the left elbow. Symptoms are most noticeable when the elbow is fully extended and she is picking up heavy objects. She is also complaining of lateral right hip pain which began after she started to wear a postop shoe for some left foot pain out of concern for a new stress fracture. In fact, her left foot continues to bother her. Her swelling has subsided but she still has pain across the dorsum of her foot. In addition to all of this, she states that she gets bilateral hand stiffness first thing in the morning which tends to improve as the day goes on. She denies any history of rheumatologic disease. She has not noticed any swelling in her elbows. She has had to resume her diclofenac and finds that her symptoms are much more tolerable when she is taking it.  Medical history reviewed. Her diabetes is under excellent control.    Review of Systems     Objective:   Physical Exam Obese. No acute distress  Right hip: Smooth painless hip range of motion with a negative log roll. There is tenderness to palpation over the greater trochanteric bursa. Left elbow: Full range of motion. No obvious effusion, although body habitus makes this difficult to evaluate. She is tender to palpation over the lateral epicondyle with reproducible pain with resisted ECRB testing. Neurovascularly intact distally. Left foot: She is tender to palpation along the midportion of the third metatarsal. Mild soft tissue swelling. No other bony tenderness appreciated. Neurovascularly intact distally.  Walking with a slight limp.       Assessment & Plan:  Resolved right elbow lateral epicondylitis Left elbow lateral epicondylitis Right hip greater trochanteric bursitis Left foot pain-rule out metatarsal stress fracture  I'm going to get some blood work to rule out rheumatological etiologies of joint pain. I will get a CBC, CMP, sedimentation rate, C-reactive protein, ANA, rheumatoid factor, and anti-CCP antibody. I would like to get x-rays of both elbows as well as of the left foot. I will call her with the results of her x-rays and her blood work once available. We had previously discussed the merits of physical therapy and I may revisit that depending on the results of her x-rays and blood work and I will discuss this further with her during our phone follow-up later this week. In the meantime, she may continue to take her diclofenac as needed.

## 2015-09-25 ENCOUNTER — Other Ambulatory Visit: Payer: Managed Care, Other (non HMO)

## 2015-09-25 DIAGNOSIS — M2550 Pain in unspecified joint: Secondary | ICD-10-CM

## 2015-09-25 LAB — COMPLETE METABOLIC PANEL WITH GFR
ALBUMIN: 4.1 g/dL (ref 3.6–5.1)
ALK PHOS: 60 U/L (ref 33–130)
ALT: 29 U/L (ref 6–29)
AST: 27 U/L (ref 10–35)
BILIRUBIN TOTAL: 0.4 mg/dL (ref 0.2–1.2)
BUN: 14 mg/dL (ref 7–25)
CALCIUM: 9 mg/dL (ref 8.6–10.4)
CO2: 28 mmol/L (ref 20–31)
CREATININE: 0.98 mg/dL (ref 0.50–1.05)
Chloride: 104 mmol/L (ref 98–110)
GFR, Est African American: 77 mL/min (ref >=60)
GFR, Est Non African American: 67 mL/min (ref >=60)
Glucose, Bld: 90 mg/dL (ref 65–99)
POTASSIUM: 4.4 mmol/L (ref 3.5–5.3)
Sodium: 140 mmol/L (ref 135–146)
TOTAL PROTEIN: 6.7 g/dL (ref 6.1–8.1)

## 2015-09-25 LAB — CBC WITH DIFFERENTIAL/PLATELET
BASOS ABS: 64 {cells}/uL (ref 0–200)
Basophils Relative: 1 %
EOS ABS: 320 {cells}/uL (ref 15–500)
Eosinophils Relative: 5 %
HCT: 37.9 % (ref 35.0–45.0)
HEMOGLOBIN: 12.7 g/dL (ref 11.7–15.5)
LYMPHS ABS: 1408 {cells}/uL (ref 850–3900)
Lymphocytes Relative: 22 %
MCH: 28.7 pg (ref 27.0–33.0)
MCHC: 33.5 g/dL (ref 32.0–36.0)
MCV: 85.7 fL (ref 80.0–100.0)
MONO ABS: 384 {cells}/uL (ref 200–950)
MPV: 9.2 fL (ref 7.5–12.5)
Monocytes Relative: 6 %
NEUTROS ABS: 4224 {cells}/uL (ref 1500–7800)
Neutrophils Relative %: 66 %
Platelets: 267 10*3/uL (ref 140–400)
RBC: 4.42 MIL/uL (ref 3.80–5.10)
RDW: 14.6 % (ref 11.0–15.0)
WBC: 6.4 10*3/uL (ref 3.8–10.8)

## 2015-09-25 LAB — C-REACTIVE PROTEIN: CRP: 2.1 mg/dL — ABNORMAL HIGH (ref <0.60)

## 2015-09-25 LAB — RHEUMATOID FACTOR: Rhuematoid fact SerPl-aCnc: <10 IU/mL (ref <=14)

## 2015-09-25 LAB — SEDIMENTATION RATE: SED RATE: 14 mm/h (ref 0–30)

## 2015-09-26 ENCOUNTER — Telehealth: Payer: Self-pay | Admitting: Sports Medicine

## 2015-09-26 ENCOUNTER — Other Ambulatory Visit: Payer: Self-pay | Admitting: *Deleted

## 2015-09-26 DIAGNOSIS — M2550 Pain in unspecified joint: Secondary | ICD-10-CM

## 2015-09-26 LAB — ANA: Anti Nuclear Antibody(ANA): NEGATIVE

## 2015-09-26 LAB — CYCLIC CITRUL PEPTIDE ANTIBODY, IGG: Cyclic Citrullin Peptide Ab: <16 Units

## 2015-09-26 NOTE — Telephone Encounter (Signed)
I spoke with the patient on the phone today after reviewing her blood work, x-rays of both elbows, and left foot x-ray. Blood work is unremarkable. I see no obvious evidence of systemic arthropathy. X-rays of her elbows are also unremarkable other than a small insignificant spur off the right olecranon. Her left foot x-ray does not show any evidence of stress fracture and I've reassured her of this finding. As we had discussed previously, I would like to enroll her in formal physical therapy for lateral epicondylitis and greater trochanteric bursitis. Patient will follow-up with me in the office in 4 weeks for reevaluation.

## 2015-10-01 ENCOUNTER — Ambulatory Visit: Payer: Managed Care, Other (non HMO) | Admitting: Sports Medicine

## 2015-10-09 ENCOUNTER — Ambulatory Visit: Payer: Managed Care, Other (non HMO) | Attending: Sports Medicine | Admitting: Physical Therapy

## 2015-10-09 ENCOUNTER — Encounter: Payer: Self-pay | Admitting: Physical Therapy

## 2015-10-09 DIAGNOSIS — R262 Difficulty in walking, not elsewhere classified: Secondary | ICD-10-CM

## 2015-10-09 DIAGNOSIS — M6281 Muscle weakness (generalized): Secondary | ICD-10-CM | POA: Insufficient documentation

## 2015-10-09 DIAGNOSIS — M25551 Pain in right hip: Secondary | ICD-10-CM | POA: Diagnosis present

## 2015-10-09 DIAGNOSIS — M25522 Pain in left elbow: Secondary | ICD-10-CM | POA: Diagnosis present

## 2015-10-10 NOTE — Therapy (Signed)
[facility] [address] [place], [place], [address] Phone: [phone]   Fax:  [phone]  Physical Therapy Evaluation  Patient Details  Name: CARLENE BICKLEY MRN: [id number] Date of Birth: July 09, 1965 Referring Provider: Dr Margaretha Sheffield   Encounter Date: 10/09/2015      PT End of Session - 10/10/15 1315    Visit Number 1   Number of Visits 16   Date for PT Re-Evaluation 12/05/15   Authorization Type [employer]    PT Start Time 1630   PT Stop Time 1720   PT Time Calculation (min) 50 min   Activity Tolerance Patient tolerated treatment well   Behavior During Therapy [facility] for tasks assessed/performed      Past Medical History  Diagnosis Date  . Diabetes mellitus without complication [facility])     Past Surgical History  Procedure Laterality Date  . Tonsillectomy    . Kidney stone surgery    . Dilation and evacuation    . Arm surgery Bilateral Skin removal    There were no vitals filed for this visit.       Subjective Assessment - 10/09/15 1639    Subjective Patient initially had right elbow pain. That resolved with anti-inflammatory medications. She then began having left elbow pain which became worse then the right. She also reports a 3 week history of right hip pain.    Pertinent History No history of right hip pain in the past. Incidious onset of right hip pain. 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 The hip gets worse when she sits for long periods of time.    Limitations Sitting;Walking;Standing   How long can you stand comfortably? around 45 minutes    How long can you walk comfortably? extended periods of time increase the hip pain.    Diagnostic tests x-ray:L unremarkable.    Patient Stated Goals Straighten out the elbow completley without pain. Improve left arm mobility. Less pain with the hip.    Currently in Pain? Yes   Pain Score 4    Pain Location Elbow   Pain Orientation Right    Pain Descriptors / Indicators Aching;Dull   Pain Type Acute pain   Pain Onset More than a month ago   Aggravating Factors  movement of the left arm; reaching    Pain Relieving Factors rest, anti-inflammatorys    Effect of Pain on Daily Activities difficulty using left arm fro ADL's IADL's    Multiple Pain Sites Yes   Pain Score 4   Pain Location Hip   Pain Orientation Right   Pain Descriptors / Indicators Aching;Burning   Aggravating Factors  leaning to the right; sitting for a long period of time    Pain Relieving Factors rest;    Effect of Pain on Daily Activities difficulty sitting for a long period of time.             [facility] PT Assessment - 10/10/15 0001    Assessment   Medical Diagnosis left lateral epicondylitis    Onset Date/Surgical Date --  hip > 3weeks elbow 4 weeks    Hand Dominance Right   Prior Therapy none    Precautions   Precautions None   Restrictions   Weight Bearing Restrictions No   Prior Function   Level of Independence Independent   Cognition   Overall Cognitive Status Within Functional Limits for tasks assessed   Observation/Other Assessments   Observations Obese; both  shoes show sings of significant pronation; Significant great toe wear on the right compared to the left.    Sensation   Additional Comments No radicular signs noted    Posture/Postural Control   Posture Comments foward head; rounded shoulders.   ROM / Strength   AROM / PROM / Strength AROM;PROM;Strength   AROM   Overall AROM Comments Pain with end range wrist extension, elbow flexion, and elbow pronation/ supination   Strength   Overall Strength Comments Grip 30lbs L 60 lbs R ain with wrist flexion and extension   Strength Assessment Site Elbow;Forearm;Wrist;Hip;Knee   Right/Left Elbow Right;Left   Right Elbow Flexion 5/5   Right Elbow Extension 5/5   Left Elbow Flexion 4/5   Left Elbow Extension 4/5   Right/Left Forearm Right;Left   Right Forearm Pronation 5/5   Right  Forearm Supination 5/5   Left Forearm Pronation 5/5   Left Forearm Supination 5/5   Right/Left Wrist Right;Left   Right Wrist Flexion 5/5   Right Wrist Extension 5/5   Right Wrist Radial Deviation 5/5   Right Wrist Ulnar Deviation 5/5   Left Wrist Flexion 3+/5   Left Wrist Extension 4/5   Left Wrist Radial Deviation 4/5   Left Wrist Ulnar Deviation 4/5   Right/Left Hip Right;Left   Right/Left Knee Right;Left   Palpation   Palpation comment tender to palpation around the R inferior trochanter; tender to palpation around the lateral epicodyle    Special Tests    Special Tests --  [place] [employer] test (-)L; ober test (+) left   Ambulation/Gait   Gait Comments decreased hip rotation, bilateral pronation R < L; decreased hip flexion; increased lateral movement with gait    Functional Gait  Assessment   Gait assessed  Yes                   [facility] Adult PT Treatment/Exercise - 10/10/15 0001    Self-Care   Self-Care Other Self-Care Comments   Other Self-Care Comments  Educated paitnet on the improtance of exercises and stretches at home. Patient educated on symptom mangement with self care at home.    Exercises   Exercises Elbow;Knee/Hip   Elbow Exercises   Forearm Pronation Limitations 2lb   Other elbow exercises mills stetch 2x20 second hold   Knee/Hip Exercises: Stretches   Piriformis Stretch Limitations 2x20sec   Other Knee/Hip Stretches thomas stretch 2x20 sec hold    Knee/Hip Exercises: Standing   Hip Flexion Limitations standing march 2x10     Knee/Hip Exercises: Supine   Bridges Limitations 2x10   Modalities   Modalities Iontophoresis;Ultrasound   Ultrasound   Ultrasound Location left forearm    Ultrasound Parameters 1.5 w/cm2 cont    Ultrasound Goals Edema;Pain   Iontophoresis   Type of Iontophoresis Dexamethasone   Location left lateral epicondyle    Dose 1cc    Time 4-6 HOUR PATCH                PT Education - 10/10/15 1313    Education provided Yes    Education Details Patient given HEP; educated patient on potential cuases of epicodylitis and bursisits. Education provided on home management.    Person(s) Educated Patient   Methods Explanation;Demonstration   Comprehension Verbalized understanding;Returned demonstration;Need further instruction          PT Short Term Goals - 10/10/15 1331    PT SHORT TERM GOAL #1   Title Patient will demsotrate full left passive elbow and wrist  motion without pain    Baseline Pain with all movements but the most with elbow pronation and flexion    Time 4   Period Weeks   Status New   PT SHORT TERM GOAL #2   Title Patient will report decreased tenderness to palaption in her  left lateral epicondyle and right greater troachnter.    Baseline significant tenderness to palpation in each    Time 4   Period Weeks   Status New   PT SHORT TERM GOAL #3   Title Patient will increase left elbow and wrist strength to 5/5    Time 4   Period Weeks   Status New   PT SHORT TERM GOAL #4   Title Patient will increase lef thip strength to 4+/5 gross   Time 4   Period Weeks   Status New   PT SHORT TERM GOAL #5   Title Patient will increase left grip strength by 20 lbs    Baseline right 60 lbs left 30lbs   Time 4   Period Weeks   Status New           PT Long Term Goals - 10/10/15 1337    PT LONG TERM GOAL #1   Title Patient will ambulate 3000' w/o increase pain in order to go to the store   Baseline unable to walk more then 10-15 minutes   Time 8   Period Weeks   Status New   PT LONG TERM GOAL #2   Title Patient will sit for 2 hours at work without increased pain    Baseline Can not sit for more then 20 minutes    Time 8   Period Weeks   Status New   PT LONG TERM GOAL #3   Title Patient will use left arm for IADL's without pain    Baseline pain when using her arm    Time 8   Period Weeks   Status New   PT LONG TERM GOAL #4   Title Patient will carry object without pain in order to  perfrom work tasks.    Baseline Pain when carrying objects    Time 8   Period Weeks   Status New               Plan - 10/10/15 1317    Clinical Impression Statement Patient is a 50 year old female with left elbow and right hip pain. her signs and symptoms are consistent with diagnosis of left lateral epicondylitis and right trochanteric bursitis. She presents with right hip and knee weakness. She has significant right side pronation with gait. Therapy suggested new shoes and in the future an orthotic. She lacks elbow supination and has left sided grip weakness. She hass difficulty holding objects with her left elbow. She would benefit from skilled therapy for the above  deficits. She was seen today for a low complexity evaluation.    Rehab Potential Good   PT Frequency 2x / week   PT Duration 8 weeks   PT Treatment/Interventions ADLs/Self Care Home Management;Cryotherapy;Electrical Stimulation;Iontophoresis 4mg /ml Dexamethasone;Moist Heat;Therapeutic exercise;Therapeutic activities;Functional mobility training;Stair training;Gait training;Neuromuscular re-education;Cognitive remediation;Patient/family education;Manual techniques;Passive range of motion;Dry needling   PT Next Visit Plan add in straight leg raise; manual ITband roll out if tolerated; ultrasound if needed; Add LAQ; wrist flexion/extension; elbow flexion/ extension; towel squeeze; continue with ultrasound and Iontophoresis   PT Home Exercise Plan elbow pronation/supination wrist flexion stretch; bridge, IT band stretch;    Consulted and Agree  with Plan of Care Patient      Patient will benefit from skilled therapeutic intervention in order to improve the following deficits and impairments:  Abnormal gait, Decreased activity tolerance, Decreased mobility, Decreased strength, Postural dysfunction, Impaired flexibility, Decreased endurance, Increased muscle spasms, Difficulty walking, Decreased range of motion  Visit  Diagnosis: Pain in right hip - Plan: PT plan of care cert/re-cert  Pain in left elbow - Plan: PT plan of care cert/re-cert  Difficulty in walking, not elsewhere classified - Plan: PT plan of care cert/re-cert  Muscle weakness (generalized) - Plan: PT plan of care cert/re-cert     Problem List Patient Active Problem List   Diagnosis Date Noted  . PTSD (post-traumatic stress disorder) 08/18/2011  . DM (diabetes mellitus) ([name]) 08/18/2011  . Sleep apnea 08/18/2011  . Mood disorder ([name]) 08/18/2011    Dessie Comaavid J Letisha Yera PT DPT  10/10/2015, 1:46 PM  [facility] [address] [place], [place], [id number] Phone: [phone]   Fax:  [phone]  Name: Ralene OkJeanna W Maland MRN: [id number] Date of Birth: October 11, 1965

## 2015-10-11 ENCOUNTER — Ambulatory Visit: Payer: Managed Care, Other (non HMO) | Attending: Sports Medicine | Admitting: Physical Therapy

## 2015-10-11 DIAGNOSIS — M25551 Pain in right hip: Secondary | ICD-10-CM | POA: Diagnosis present

## 2015-10-11 DIAGNOSIS — R262 Difficulty in walking, not elsewhere classified: Secondary | ICD-10-CM | POA: Diagnosis present

## 2015-10-11 DIAGNOSIS — M25522 Pain in left elbow: Secondary | ICD-10-CM | POA: Diagnosis present

## 2015-10-11 DIAGNOSIS — M6281 Muscle weakness (generalized): Secondary | ICD-10-CM | POA: Diagnosis present

## 2015-10-11 NOTE — Therapy (Signed)
[facility] [address] [place], [place], [address] Phone: [phone]   Fax:  [phone]  Physical Therapy Treatment  Patient Details  Name: Erika Woods MRN: [id number] Date of Birth: [birth year] Referring Provider: Dr Margaretha Sheffield   Encounter Date: 10/11/2015      PT End of Session - 10/11/15 1637    Visit Number 2   Number of Visits 16   Date for PT Re-Evaluation 12/05/15   Authorization Type Aetna    PT Start Time 0435   PT Stop Time 0520   PT Time Calculation (min) 45 min      Past Medical History  Diagnosis Date  . Diabetes mellitus without complication [facility])     Past Surgical History  Procedure Laterality Date  . Tonsillectomy    . Kidney stone surgery    . Dilation and evacuation    . Arm surgery Bilateral Skin removal    There were no vitals filed for this visit.      Subjective Assessment - 10/11/15 1636    Pain Score 4    Pain Location Elbow   Pain Orientation Left   Pain Score 3   Pain Location Hip   Pain Orientation Right                         [facility] Adult PT Treatment/Exercise - 10/11/15 0001    Knee/Hip Exercises: Supine   Straight Leg Raises 10 reps   Knee/Hip Exercises: Sidelying   Clams x 10   Iontophoresis   Type of Iontophoresis Dexamethasone   Location left lateral epicondyle    Dose 1cc    Time 4-6 HOUR PATCH   Manual Therapy   Manual Therapy Soft tissue mobilization   Soft tissue mobilization lateral hip and wrist extensors using massage tools and tennis ball                 PT Education - 10/11/15 1721    Education provided Yes   Education Details SLR, WRist extension AROM, S/L Clam   Person(s) Educated Patient   Methods Explanation;Handout   Comprehension Verbalized understanding          PT Short Term Goals - 10/10/15 1331    PT SHORT TERM GOAL #1   Title Patient will demsotrate full left passive elbow and wrist motion without pain    Baseline Pain with all movements but the most with elbow pronation and flexion    Time 4   Period Weeks   Status New   PT SHORT TERM GOAL #2   Title Patient will report decreased tenderness to palaption in her  left lateral epicondyle and right greater troachnter.    Baseline significant tenderness to palpation in each    Time 4   Period Weeks   Status New   PT SHORT TERM GOAL #3   Title Patient will increase left elbow and wrist strength to 5/5    Time 4   Period Weeks   Status New   PT SHORT TERM GOAL #4   Title Patient will increase lef thip strength to 4+/5 gross   Time 4   Period Weeks   Status New   PT SHORT TERM GOAL #5   Title Patient will increase left grip strength by 20 lbs    Baseline right 60 lbs left 30lbs   Time 4   Period Weeks   Status New  PT Long Term Goals - 10/10/15 1337    PT LONG TERM GOAL #1   Title Patient will ambulate 3000' w/o increase pain in order to go to the store   Baseline unable to walk more then 10-15 minutes   Time 8   Period Weeks   Status New   PT LONG TERM GOAL #2   Title Patient will sit for 2 hours at work without increased pain    Baseline Can not sit for more then 20 minutes    Time 8   Period Weeks   Status New   PT LONG TERM GOAL #3   Title Patient will use left arm for IADL's without pain    Baseline pain when using her arm    Time 8   Period Weeks   Status New   PT LONG TERM GOAL #4   Title Patient will carry object without pain in order to perfrom work tasks.    Baseline Pain when carrying objects    Time 8   Period Weeks   Status New               Plan - 10/11/15 1723    Clinical Impression Statement Pt reports compliance with HEP. SHe has been stretching wrist flexors instead of extensors. Instructed her in proper stretch. Manual using tennis ball / massage roller to lateral hip with pt reporting tenderness. Also used rock blade to wrist extensors with tenderness and pertulence noted.  Repeated Ionto patch. Will assess benefit of manual next visit.    PT Next Visit Plan review straight leg raise; manual ITband roll out if tolerated; ultrasound if needed; Add LAQ; wrist flexion/extension; elbow flexion/ extension; towel squeeze; continue with ultrasound and Iontophoresis      Patient will benefit from skilled therapeutic intervention in order to improve the following deficits and impairments:  Abnormal gait, Decreased activity tolerance, Decreased mobility, Decreased strength, Postural dysfunction, Impaired flexibility, Decreased endurance, Increased muscle spasms, Difficulty walking, Decreased range of motion  Visit Diagnosis: Pain in right hip  Pain in left elbow  Difficulty in walking, not elsewhere classified  Muscle weakness (generalized)     Problem List Patient Active Problem List   Diagnosis Date Noted  . PTSD (post-traumatic stress disorder) 08/18/2011  . DM (diabetes mellitus) ([name]) 08/18/2011  . Sleep apnea 08/18/2011  . Mood disorder ([name]) 08/18/2011    Erika Woods, Erika Woods, PTA 10/11/2015, 5:30 PM  [facility] [address] [place], [place], [address] Phone: [phone]   Fax:  [phone]  Name: Erika Woods MRN: [id number] Date of Birth: [birth year]

## 2015-10-11 NOTE — Patient Instructions (Signed)
Straight Leg Raise    Tighten stomach and slowly raise locked right leg _12___ inches from floor. Repeat __10__ times per set. Do __2__ sets per session. Do _2___ sessions per day.  Abduction: Clam (Eccentric) - Side-Lying    Lie on side with knees bent. Lift top knee, keeping feet together. Keep trunk steady. Slowly lower for 3-5 seconds. _10__ reps per set, _2__ sets per day, _7__ days per week.  AROM: Wrist Extension    With right palm down, bend wrist up. SLOWLY LOWER Repeat _10___ times per set. Do ___2_ sets per session. Do ____ sessions per day.

## 2015-10-16 ENCOUNTER — Ambulatory Visit: Payer: Managed Care, Other (non HMO) | Admitting: Physical Therapy

## 2015-10-16 DIAGNOSIS — M25551 Pain in right hip: Secondary | ICD-10-CM

## 2015-10-16 DIAGNOSIS — M6281 Muscle weakness (generalized): Secondary | ICD-10-CM

## 2015-10-16 DIAGNOSIS — R262 Difficulty in walking, not elsewhere classified: Secondary | ICD-10-CM

## 2015-10-16 DIAGNOSIS — M25522 Pain in left elbow: Secondary | ICD-10-CM

## 2015-10-17 NOTE — Therapy (Signed)
[facility] [address] [place], [place], [address] Phone: [phone]   Fax:  [phone]  Physical Therapy Treatment  Patient Details  Name: PUJA CAFFEY MRN: [id number] Date of Birth: Jul 02, 1965 Referring Provider: Dr Margaretha Sheffield   Encounter Date: 10/16/2015      PT End of Session - 10/17/15 0839    Visit Number 3   Number of Visits 16   Date for PT Re-Evaluation 12/05/15   Authorization Type Aetna    PT Start Time 1334   PT Stop Time 1415   PT Time Calculation (min) 41 min   Activity Tolerance Patient tolerated treatment well   Behavior During Therapy [facility] for tasks assessed/performed      Past Medical History  Diagnosis Date  . Diabetes mellitus without complication [facility])     Past Surgical History  Procedure Laterality Date  . Tonsillectomy    . Kidney stone surgery    . Dilation and evacuation    . Arm surgery Bilateral Skin removal    There were no vitals filed for this visit.      Subjective Assessment - 10/16/15 1344    Subjective Patient went to [facility] over the weekend. She was on her feet for 12 hours and had very little pain oin her hip. Her elbow has improved as well. she is hsving no pain in her hip today just a little soreness to touch. She is having moinor pain i her elbow. She reports about a 2/10.    Pertinent History No history of right hip pain in the past. Incidious onset of right hip pain. 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 The hip gets worse when she sits for long periods of time.                                    PT Short Term Goals - 10/17/15 1640    PT SHORT TERM GOAL #1   Title Patient will demsotrate full left passive elbow and wrist motion without pain    Baseline improved elbow motion but still pain at end ranges.    Time 4   Period Weeks   Status On-going   PT SHORT TERM GOAL #2   Title Patient will report  decreased tenderness to palaption in her  left lateral epicondyle and right greater troachnter.    Baseline Improved tenderness    Time 4   Period Weeks   Status On-going   PT SHORT TERM GOAL #3   Title Patient will increase left elbow and wrist strength to 5/5    Baseline Not tested    Time 4   Period Weeks   Status On-going   PT SHORT TERM GOAL #4   Title Patient will increase lef thip strength to 4+/5 gross   Time 4   Period Weeks   Status On-going   PT SHORT TERM GOAL #5   Title Patient will increase left grip strength by 20 lbs    Baseline right 60 lbs left 30lbs   Time 4   Period Weeks   Status On-going           PT Long Term Goals - 10/10/15 1337    PT LONG TERM GOAL #1   Title Patient will ambulate 3000' w/o increase pain in order to go to the store   Baseline unable  to walk more then 10-15 minutes   Time 8   Period Weeks   Status New   PT LONG TERM GOAL #2   Title Patient will sit for 2 hours at work without increased pain    Baseline Can not sit for more then 20 minutes    Time 8   Period Weeks   Status New   PT LONG TERM GOAL #3   Title Patient will use left arm for IADL's without pain    Baseline pain when using her arm    Time 8   Period Weeks   Status New   PT LONG TERM GOAL #4   Title Patient will carry object without pain in order to perfrom work tasks.    Baseline Pain when carrying objects    Time 8   Period Weeks   Status New               Plan - 10/17/15 1637    Clinical Impression Statement Patient is making progress. She had some pain with biceps flexion. She was able to tolerate all standing exercises well. She continues to have some tenderness to palpation in the hip but overall she is doing well.    PT Treatment/Interventions ADLs/Self Care Home Management;Cryotherapy;Electrical Stimulation;Iontophoresis 4mg /ml Dexamethasone;Moist Heat;Therapeutic exercise;Therapeutic activities;Functional mobility training;Stair training;Gait  training;Neuromuscular re-education;Cognitive remediation;Patient/family education;Manual techniques;Passive range of motion;Dry needling   PT Next Visit Plan continue with manual therapy and strengthening as tolerated. Continue modalities and Ionta as long as there is benefit    PT Home Exercise Plan elbow pronation/supination wrist flexion stretch; bridge, IT band stretch;    Consulted and Agree with Plan of Care Patient      Patient will benefit from skilled therapeutic intervention in order to improve the following deficits and impairments:     Visit Diagnosis: Pain in right hip  Pain in left elbow  Difficulty in walking, not elsewhere classified  Muscle weakness (generalized)     Problem List Patient Active Problem List   Diagnosis Date Noted  . PTSD (post-traumatic stress disorder) 08/18/2011  . DM (diabetes mellitus) ([name]) 08/18/2011  . Sleep apnea 08/18/2011  . Mood disorder ([name]) 08/18/2011    Dessie Comaavid J Kjuan Seipp PT DPT  10/17/2015, 4:49 PM  [facility] [address] [place], [place], [address] Phone: [phone]   Fax:  [phone]  Name: Ralene OkJeanna W Karle MRN: [id number] Date of Birth: [birth year]

## 2015-10-19 ENCOUNTER — Ambulatory Visit: Payer: Managed Care, Other (non HMO) | Admitting: Physical Therapy

## 2015-10-19 DIAGNOSIS — M6281 Muscle weakness (generalized): Secondary | ICD-10-CM

## 2015-10-19 DIAGNOSIS — M25551 Pain in right hip: Secondary | ICD-10-CM | POA: Diagnosis not present

## 2015-10-19 DIAGNOSIS — M25522 Pain in left elbow: Secondary | ICD-10-CM

## 2015-10-19 DIAGNOSIS — R262 Difficulty in walking, not elsewhere classified: Secondary | ICD-10-CM

## 2015-10-19 NOTE — Therapy (Signed)
[facility] [address] [place], [place], [address] Phone: [phone]   Fax:  [phone]  Physical Therapy Treatment  Patient Details  Name: Erika Woods MRN: [id number] Date of Birth: 10-11-1965 Referring Provider: Dr Margaretha Sheffield   Encounter Date: 10/19/2015      PT End of Session - 10/19/15 1230    Visit Number 4   Number of Visits 16   Date for PT Re-Evaluation 12/05/15   Authorization Type Aetna    PT Start Time [phone]   PT Stop Time 1015   PT Time Calculation (min) 39 min   Activity Tolerance Patient tolerated treatment well   Behavior During Therapy [facility] for tasks assessed/performed      Past Medical History  Diagnosis Date  . Diabetes mellitus without complication [facility])     Past Surgical History  Procedure Laterality Date  . Tonsillectomy    . Kidney stone surgery    . Dilation and evacuation    . Arm surgery Bilateral Skin removal    There were no vitals filed for this visit.      Subjective Assessment - 10/19/15 1027    Subjective Patient reports almost no hip pain. She just has pain time to time when she fgoes down steps. Her elbow has been sore since she drove to [place]. Her pain is about a 4/10 but she reports it is always worse in the morning.    Pertinent History No history of right hip pain in the past. Incidious onset of right hip pain. 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 The hip gets worse when she sits for long periods of time.    Limitations Sitting;Walking;Standing   How long can you stand comfortably? around 45 minutes    How long can you walk comfortably? extended periods of time increase the hip pain.    Diagnostic tests x-ray:L unremarkable.    Patient Stated Goals Straighten out the elbow completley without pain. Improve left arm mobility. Less pain with the hip.    Pain Score 4    Pain Location Elbow   Pain Orientation Left   Pain Descriptors /  Indicators Aching;Dull   Pain Onset More than a month ago   Aggravating Factors  using lef arm; going down the steps   Pain Relieving Factors rest;    Effect of Pain on Daily Activities difficulty uising left arm   Multiple Pain Sites No                         [facility] Adult PT Treatment/Exercise - 10/19/15 0001    Knee/Hip Exercises: Standing   Other Standing Knee Exercises Step down 4" 2x10; Step up 4" 2x10 Mini squats 2x10   Modalities   Modalities Iontophoresis;Ultrasound   Ultrasound   Ultrasound Location left fore arm    Ultrasound Parameters 1.5 w/cm 2    Ultrasound Goals Edema;Pain   Iontophoresis   Type of Iontophoresis Dexamethasone   Location left lateral epicondyle    Dose 1cc    Time 4-6 HOUR PATCH   Manual Therapy   Manual Therapy Soft tissue mobilization   Soft tissue mobilization lateral hip and wrist extensors using massage tools and tennis ball                   PT Short Term Goals - 10/17/15 1640    PT SHORT TERM GOAL #1   Title  Patient will demsotrate full left passive elbow and wrist motion without pain    Baseline improved elbow motion but still pain at end ranges.    Time 4   Period Weeks   Status On-going   PT SHORT TERM GOAL #2   Title Patient will report decreased tenderness to palaption in her  left lateral epicondyle and right greater troachnter.    Baseline Improved tenderness    Time 4   Period Weeks   Status On-going   PT SHORT TERM GOAL #3   Title Patient will increase left elbow and wrist strength to 5/5    Baseline Not tested    Time 4   Period Weeks   Status On-going   PT SHORT TERM GOAL #4   Title Patient will increase lef thip strength to 4+/5 gross   Time 4   Period Weeks   Status On-going   PT SHORT TERM GOAL #5   Title Patient will increase left grip strength by 20 lbs    Baseline right 60 lbs left 30lbs   Time 4   Period Weeks   Status On-going           PT Long Term Goals - 10/10/15 1337     PT LONG TERM GOAL #1   Title Patient will ambulate 3000' w/o increase pain in order to go to the store   Baseline unable to walk more then 10-15 minutes   Time 8   Period Weeks   Status New   PT LONG TERM GOAL #2   Title Patient will sit for 2 hours at work without increased pain    Baseline Can not sit for more then 20 minutes    Time 8   Period Weeks   Status New   PT LONG TERM GOAL #3   Title Patient will use left arm for IADL's without pain    Baseline pain when using her arm    Time 8   Period Weeks   Status New   PT LONG TERM GOAL #4   Title Patient will carry object without pain in order to perfrom work tasks.    Baseline Pain when carrying objects    Time 8   Period Weeks   Status New               Plan - 10/19/15 1231    Clinical Impression Statement Patient had some soreness in her elbow today but her hip is improving. She will be on vacation next week. Continue toi progress as tolerated. She is progressing towards her hip goal.    PT Frequency 2x / week   PT Duration 8 weeks   PT Treatment/Interventions ADLs/Self Care Home Management;Cryotherapy;Electrical Stimulation;Iontophoresis /ml Dexamethasone;Moist Heat;Therapeutic exercise;Therapeutic activities;Functional mobility training;Stair training;Gait training;Neuromuscular re-education;Cognitive remediation;Patient/family education;Manual techniques;Passive range of motion;Dry needling   PT Next Visit Plan continue with manual therapy and strengthening as tolerated. Continue modalities and Ionta as long as there is benefit    PT Home Exercise Plan elbow pronation/supination wrist flexion stretch; bridge, IT band stretch;    Consulted and Agree with Plan of Care Patient      Patient will benefit from skilled therapeutic intervention in order to improve the following deficits and impairments:  Abnormal gait, Decreased activity tolerance, Decreased mobility, Decreased strength, Postural dysfunction,  Impaired flexibility, Decreased endurance, Increased muscle spasms, Difficulty walking, Decreased range of motion  Visit Diagnosis: Pain in left elbow  Pain in right hip  Difficulty in walking, not  elsewhere classified  Muscle weakness (generalized)     Problem List Patient Active Problem List   Diagnosis Date Noted  . PTSD (post-traumatic stress disorder) 08/18/2011  . DM (diabetes mellitus) ([name]) 08/18/2011  . Sleep apnea 08/18/2011  . Mood disorder ([name]) 08/18/2011    Dessie Comaavid J Zamari Bonsall  PT DPT  10/19/2015, 12:33 PM  [facility] [address] [place], [place], [address] Phone: [phone]   Fax:  [phone]  Name: Erika Woods MRN: [id number] Date of Birth: [birth year]

## 2015-10-29 ENCOUNTER — Encounter: Payer: Self-pay | Admitting: Sports Medicine

## 2015-10-29 ENCOUNTER — Ambulatory Visit: Payer: Managed Care, Other (non HMO) | Admitting: Physical Therapy

## 2015-10-29 ENCOUNTER — Ambulatory Visit (INDEPENDENT_AMBULATORY_CARE_PROVIDER_SITE_OTHER): Payer: Managed Care, Other (non HMO) | Admitting: Sports Medicine

## 2015-10-29 VITALS — BP 104/69 | HR 62 | Ht 65.0 in | Wt 296.0 lb

## 2015-10-29 DIAGNOSIS — R262 Difficulty in walking, not elsewhere classified: Secondary | ICD-10-CM

## 2015-10-29 DIAGNOSIS — M7712 Lateral epicondylitis, left elbow: Secondary | ICD-10-CM

## 2015-10-29 DIAGNOSIS — M25551 Pain in right hip: Secondary | ICD-10-CM | POA: Diagnosis not present

## 2015-10-29 DIAGNOSIS — M6281 Muscle weakness (generalized): Secondary | ICD-10-CM

## 2015-10-29 DIAGNOSIS — M25522 Pain in left elbow: Secondary | ICD-10-CM

## 2015-10-29 NOTE — Therapy (Signed)
[facility] [address] [place], [place], [address] Phone: [phone]   Fax:  [phone]  Physical Therapy Treatment  Patient Details  Name: Erika Woods MRN: [id number] Date of Birth: Dec 15, 1965 Referring Provider: Dr Margaretha Sheffield   Encounter Date: 10/29/2015      PT End of Session - 10/29/15 1020    Visit Number 5   Number of Visits 16   Date for PT Re-Evaluation 12/05/15   Authorization Type Aetna    PT Start Time 0930   PT Stop Time 1016   PT Time Calculation (min) 46 min   Activity Tolerance Patient tolerated treatment well   Behavior During Therapy [facility] for tasks assessed/performed      Past Medical History  Diagnosis Date  . Diabetes mellitus without complication [facility])     Past Surgical History  Procedure Laterality Date  . Tonsillectomy    . Kidney stone surgery    . Dilation and evacuation    . Arm surgery Bilateral Skin removal    There were no vitals filed for this visit.      Subjective Assessment - 10/29/15 0948    Subjective [place] the [place]. She reports no increase in hip pain. She continues to have some elbow soreness. She has done her exercises. Her elbow pain is about a 4/10. She has seen Dr Margaretha Sheffield who feels she would benefit from dry needling.    Pertinent History No history of right hip pain in the past. Incidious onset of right hip pain. 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 The hip gets worse when she sits for long periods of time.    Limitations Sitting;Walking;Standing   How long can you stand comfortably? around 45 minutes    How long can you walk comfortably? extended periods of time increase the hip pain.    Diagnostic tests x-ray:L unremarkable.    Patient Stated Goals Straighten out the elbow completley without pain. Improve left arm mobility. Less pain with the hip.    Currently in Pain? Yes   Pain Score 4    Pain Location  Elbow   Pain Orientation Left   Pain Descriptors / Indicators Aching;Dull   Pain Onset More than a month ago   Aggravating Factors  using left arm; going down the steps   Pain Relieving Factors rest;    Effect of Pain on Daily Activities difficulty using left arm    Multiple Pain Sites No                         [facility] Adult PT Treatment/Exercise - 10/29/15 0001    Elbow Exercises   Elbow Extension Limitations 2lb 20x    Forearm Pronation Limitations 2lb 20x    Wrist Flexion Limitations 2lb 20x    Wrist Extension Limitations 2lb 20x    Other elbow exercises mills stetch 2x20 second hold   Other elbow exercises velcro finger extension with middle x10; velcor roller 2x each way.    Knee/Hip Exercises: Standing   Other Standing Knee Exercises Step down 4" 2x10; Step up 6" 2x10 Mini squats 2x10          Trigger Point Dry Needling - 10/29/15 1502    Consent Given? Yes   Education Handout Provided No  educated and discussed   Muscles Treated Upper Body --  L supinator and common extensor x 2 with  pistoning              PT Education - 10/29/15 1020    Education provided Yes   Education Details theory behind dry needling   Person(s) Educated Patient   Methods Explanation;Handout   Comprehension Verbalized understanding          PT Short Term Goals - 10/29/15 1043    PT SHORT TERM GOAL #1   Title Patient will demsotrate full left passive elbow and wrist motion without pain    Baseline improved elbow motion but still pain at end ranges.    Time 4   Period Weeks   Status On-going   PT SHORT TERM GOAL #2   Title Patient will report decreased tenderness to palaption in her  left lateral epicondyle and right greater troachnter.    Baseline improving    Time 4   Period Weeks   Status On-going   PT SHORT TERM GOAL #3   Title Patient will increase left elbow and wrist strength to 5/5    Baseline Not tested    Time 4   Period Weeks   Status On-going    PT SHORT TERM GOAL #4   Title Patient will increase lef thip strength to 4+/5 gross   Time 4   Period Weeks   Status On-going   PT SHORT TERM GOAL #5   Title Patient will increase left grip strength by 20 lbs    Baseline right 60 lbs left 30lbs   Time 4   Period Weeks   Status On-going           PT Long Term Goals - 10/10/15 1337    PT LONG TERM GOAL #1   Title Patient will ambulate 3000' w/o increase pain in order to go to the store   Baseline unable to walk more then 10-15 minutes   Time 8   Period Weeks   Status New   PT LONG TERM GOAL #2   Title Patient will sit for 2 hours at work without increased pain    Baseline Can not sit for more then 20 minutes    Time 8   Period Weeks   Status New   PT LONG TERM GOAL #3   Title Patient will use left arm for IADL's without pain    Baseline pain when using her arm    Time 8   Period Weeks   Status New   PT LONG TERM GOAL #4   Title Patient will carry object without pain in order to perfrom work tasks.    Baseline Pain when carrying objects    Time 8   Period Weeks   Status New               Plan - 10/29/15 1021    Clinical Impression Statement A certified dry needling specialist was available for needling. The patient tolerated dry needling well. multiple twitches were palpable during DN of the L supinator and common extenor and lengthening of the muscle groups were noted; pt was monitiored throughout treatment. She had some sorenss but gflet good relelase. She had very little pain in the hip with steps.   Rehab Potential Good   PT Duration 8 weeks   PT Treatment/Interventions ADLs/Self Care Home Management;Cryotherapy;Electrical Stimulation;Iontophoresis 4mg /ml Dexamethasone;Moist Heat;Therapeutic exercise;Therapeutic activities;Functional mobility training;Stair training;Gait training;Neuromuscular re-education;Cognitive remediation;Patient/family education;Manual techniques;Passive range of motion;Dry needling    PT Next Visit Plan assess tolerance to dry needling. Continue with strengthening and steps.  PT Home Exercise Plan elbow pronation/supination wrist flexion stretch; bridge, IT band stretch;    Consulted and Agree with Plan of Care Patient      Patient will benefit from skilled therapeutic intervention in order to improve the following deficits and impairments:     Visit Diagnosis: Pain in right hip  Pain in left elbow  Muscle weakness (generalized)  Difficulty in walking, not elsewhere classified     Problem List Patient Active Problem List   Diagnosis Date Noted  . PTSD (post-traumatic stress disorder) 08/18/2011  . DM (diabetes mellitus) ([name]) 08/18/2011  . Sleep apnea 08/18/2011  . Mood disorder ([name]) 08/18/2011    Dessie Coma PT DPT  10/29/2015, 3:25 PM  [facility] [address] [place], [place], [address] Phone: [phone]   Fax:  [phone]  Name: Erika Woods MRN: [id number] Date of Birth: 1966/01/10

## 2015-10-29 NOTE — Progress Notes (Signed)
   Subjective:    Patient ID: Erika Woods, female    DOB: Aug 05, 1965, 50 y.o.   MRN: [id number]  HPI   Patient comes in today for follow-up. Right hip pain has nearly resolved. Left elbow pain persists but seems to be improving. She has had 4 physical therapy visits. In addition to exercises they are also doing iontophoresis. She is asking about the possibility of dry needling.    Review of Systems     Objective:   Physical Exam  Well-developed, well-nourished. No acute distress  Left elbow: Full range of motion. No effusion. Pain with ECRB testing. Neurovascularly intact distally.      Assessment & Plan:   Improving right hip pain secondary to greater trochanteric bursitis Left elbow pain secondary to lateral epicondylitis  Patient will continue with physical therapy. I think it is okay for them to try some dry needling for her left elbow lateral epicondylitis. As long as symptoms continue to improve she may follow-up with me when necessary. If left elbow pain persists, we did discuss imaging initially in the form of an x-ray. Of note, patient was reassured that her recent blood work was normal.

## 2015-11-01 ENCOUNTER — Ambulatory Visit: Payer: Managed Care, Other (non HMO) | Admitting: Physical Therapy

## 2015-11-01 DIAGNOSIS — M25522 Pain in left elbow: Secondary | ICD-10-CM

## 2015-11-01 DIAGNOSIS — M25551 Pain in right hip: Secondary | ICD-10-CM | POA: Diagnosis not present

## 2015-11-01 DIAGNOSIS — M6281 Muscle weakness (generalized): Secondary | ICD-10-CM

## 2015-11-01 DIAGNOSIS — R262 Difficulty in walking, not elsewhere classified: Secondary | ICD-10-CM

## 2015-11-01 NOTE — Therapy (Signed)
[facility] [address] [place], [place], [address] Phone: [phone]   Fax:  [phone]  Physical Therapy Treatment  Patient Details  Name: Erika Woods MRN: [id number] Date of Birth: 23-Mar-1966 Referring Provider: Dr Margaretha Sheffieldraper   Encounter Date: 11/01/2015      PT End of Session - 11/01/15 1031    Visit Number 6   Number of Visits 16   Date for PT Re-Evaluation 12/05/15   Authorization Type Aetna    PT Start Time 1022   PT Stop Time 1100   PT Time Calculation (min) 38 min   Activity Tolerance Patient tolerated treatment well   Behavior [facility] Therapy [facility] tasks assessed/performed      Past Medical History  Diagnosis Date  . Diabetes mellitus without complication [facility]([name])     Past Surgical History  Procedure Laterality Date  . Tonsillectomy    . Kidney stone surgery    . Dilation and evacuation    . Arm surgery Bilateral Skin removal    There were no vitals filed for this visit.      Subjective Assessment - 11/01/15 1025    Subjective Patient reports her pain in her elbow is about a 3/10. She reports the pain has improved since the needling.    Pertinent History No history of right hip pain in the past. Incidious onset of right hip pain. 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 The hip gets worse when she sits for long periods of time.    Limitations Sitting;Walking   How long can you stand comfortably? around 45 minutes    How long can you walk comfortably? extended periods of time increase the hip pain.    Diagnostic tests x-ray:L unremarkable.    Patient Stated Goals Straighten out the elbow completley without pain. Improve left arm mobility. Less pain with the hip.    Currently in Pain? Yes   Pain Score 3    Pain Location Elbow   Pain Descriptors / Indicators Aching;Dull   Pain Type Acute pain   Pain Onset More than a month ago   Aggravating Factors  using left arm/  stairs    Pain Relieving Factors rest    Multiple Pain Sites No                                 PT Education - 11/01/15 1030    Education provided Yes   Education Details theory behind dry needling , imorovtance of hip strengthening    Person(s) Educated Patient   Methods Explanation;Handout   Comprehension Verbalized understanding;Returned demonstration          PT Short Term Goals - 11/01/15 1321    PT SHORT TERM GOAL #1   Title Patient will demsotrate full left passive elbow and wrist motion without pain    Baseline less pain    Time 4   Period Weeks   Status On-going   PT SHORT TERM GOAL #2   Title Patient will report decreased tenderness to palaption in her  left lateral epicondyle and right greater troachnter.    Baseline improving    Time 4   Period Weeks   Status On-going   PT SHORT TERM GOAL #3   Title Patient will increase left elbow and wrist strength to 5/5    Baseline Not tested    Time 4  Period Weeks   PT SHORT TERM GOAL #4   Title Patient will increase lef thip strength to 4+/5 gross   Time 4   Period Weeks   Status On-going   PT SHORT TERM GOAL #5   Title Patient will increase left grip strength by 20 lbs    Time 4   Period Weeks   Status On-going           PT Long Term Goals - 10/10/15 1337    PT LONG TERM GOAL #1   Title Patient will ambulate 3000' w/o increase pain in order to go to the store   Baseline unable to walk more then 10-15 minutes   Time 8   Period Weeks   Status New   PT LONG TERM GOAL #2   Title Patient will sit for 2 hours at work without increased pain    Baseline Can not sit for more then 20 minutes    Time 8   Period Weeks   Status New   PT LONG TERM GOAL #3   Title Patient will use left arm for IADL's without pain    Baseline pain when using her arm    Time 8   Period Weeks   Status New   PT LONG TERM GOAL #4   Title Patient will carry object without pain in order to perfrom work  tasks.    Baseline Pain when carrying objects    Time 8   Period Weeks   Status New               Plan - 11/01/15 1104    Clinical Impression Statement Kristoffer Leamon a cettified dry needler was availabel to needle the patients elbow. The patient tolerated dry needling well. Multiple twithces were palpable during dry needling. Needling done in the supinator and common extensor as well as the proximal common extensor. She reported improved pain in the elbow after treatment.  The patient was also given durther hip strengthening exercises.    Rehab Potential Good   PT Frequency 2x / week   PT Duration 8 weeks   PT Treatment/Interventions ADLs/Self Care Home Management;Cryotherapy;Electrical Stimulation;Iontophoresis 4mg /ml Dexamethasone;Moist Heat;Therapeutic exercise;Therapeutic activities;Functional mobility training;Stair training;Gait training;Neuromuscular re-education;Cognitive remediation;Patient/family education;Manual techniques;Passive range of motion;Dry needling   PT Next Visit Plan assess tolerance to dry needling. Continue with strengthening and steps.    PT Home Exercise Plan elbow pronation/supination wrist flexion stretch; bridge, IT band stretch;    Consulted and Agree with Plan of Care Patient      Patient will benefit from skilled therapeutic intervention in order to improve the following deficits and impairments:  Abnormal gait, Decreased activity tolerance, Decreased mobility, Decreased strength, Postural dysfunction, Impaired flexibility, Decreased endurance, Increased muscle spasms, Difficulty walking, Decreased range of motion  Visit Diagnosis: Pain in right hip  Pain in left elbow  Muscle weakness (generalized)  Difficulty in walking, not elsewhere classified     Problem List Patient Active Problem List   Diagnosis Date Noted  . PTSD (post-traumatic stress disorder) 08/18/2011  . DM (diabetes mellitus) ([name]) 08/18/2011  . Sleep apnea 08/18/2011   . Mood disorder ([name]) 08/18/2011    Dessie Comaavid J Damyiah Moxley PT DPT  11/01/2015, 1:22 PM  [facility] [address] [place], [place], [address] Phone: [phone]   Fax:  [phone]  Name: Erika Woods MRN: [id number] Date of Birth: 1965/09/01

## 2015-11-06 ENCOUNTER — Ambulatory Visit: Payer: Managed Care, Other (non HMO) | Admitting: Physical Therapy

## 2015-11-06 DIAGNOSIS — M25551 Pain in right hip: Secondary | ICD-10-CM | POA: Diagnosis not present

## 2015-11-06 DIAGNOSIS — M25522 Pain in left elbow: Secondary | ICD-10-CM

## 2015-11-06 DIAGNOSIS — M6281 Muscle weakness (generalized): Secondary | ICD-10-CM

## 2015-11-06 NOTE — Therapy (Signed)
[facility] [address] [place], [place], [address] Phone: [phone]   Fax:  [phone]  Physical Therapy Treatment  Patient Details  Name: Erika Woods MRN: [id number] Date of Birth: 05-Aug-1965 Referring Provider: Dr Margaretha Sheffieldraper   Encounter Date: 11/06/2015      PT End of Session - 11/06/15 1256    Visit Number 7   Number of Visits 16   Date for PT Re-Evaluation 12/05/15   Authorization Type Aetna    PT Start Time 1105   PT Stop Time 1200   PT Time Calculation (min) 55 min   Activity Tolerance Patient tolerated treatment well   Behavior During Therapy [facility] for tasks assessed/performed      Past Medical History  Diagnosis Date  . Diabetes mellitus without complication [facility]([name])     Past Surgical History  Procedure Laterality Date  . Tonsillectomy    . Kidney stone surgery    . Dilation and evacuation    . Arm surgery Bilateral Skin removal    There were no vitals filed for this visit.      Subjective Assessment - 11/06/15 1110    Subjective I have had improvement in elbow pain especially with dry needling   Pertinent History No history of right hip pain in the past. Incidious onset of right hip pain. 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 The hip gets worse when she sits for long periods of time.    Patient Stated Goals Straighten out the elbow completley without pain. Improve left arm mobility. Less pain with the hip.    Currently in Pain? Yes   Pain Score 3   2-3   Pain Location Elbow   Pain Orientation Left   Pain Descriptors / Indicators Aching;Dull   Pain Onset More than a month ago                         [facility]/Exercise - 11/06/15 1119    Elbow Exercises   Elbow Extension Limitations 3 lb 2 x 10   Forearm Pronation Limitations 3 lb 20x    Wrist Flexion Limitations 3lb 20x   Other elbow exercises flex bar 10 x eccentric with VC  constantly  Pt given red flex bar to borrow from Garen LahLawrie Beardsley PT   Manual Therapy   Manual Therapy Soft tissue mobilization   Soft tissue mobilization IASTYM tool for left common ext, triceps and supinator/ pronators           Trigger Point Dry Needling - 11/06/15 1122    Consent Given? Yes   Education Handout Provided Yes   Muscles Treated Upper Body --  Left common wrist ext. sup/pronator, left triceps/ twitch      Multiple twitches for left triceps, Pronator/supinator and common wrist extensors. Pt given handout for explanation of dry needling         PT Education - 11/06/15 1253    Education provided Yes   Education Details handout for left triceps extension, and verbally explained eccentric lengthening with flex bar exericise and written dry needling information   Person(s) Educated Patient   Methods Explanation;Demonstration;Tactile cues;Verbal cues;Handout   Comprehension Verbalized understanding;Returned demonstration          PT Short Term Goals - 11/01/15 1321    PT SHORT TERM GOAL #1   Title Patient will demsotrate full left passive elbow and wrist motion without  pain    Baseline less pain    Time 4   Period Weeks   Status On-going   PT SHORT TERM GOAL #2   Title Patient will report decreased tenderness to palaption in her  left lateral epicondyle and right greater troachnter.    Baseline improving    Time 4   Period Weeks   Status On-going   PT SHORT TERM GOAL #3   Title Patient will increase left elbow and wrist strength to 5/5    Baseline Not tested    Time 4   Period Weeks   PT SHORT TERM GOAL #4   Title Patient will increase lef thip strength to 4+/5 gross   Time 4   Period Weeks   Status On-going   PT SHORT TERM GOAL #5   Title Patient will increase left grip strength by 20 lbs    Time 4   Period Weeks   Status On-going           PT Long Term Goals - 10/10/15 1337    PT LONG TERM GOAL #1   Title Patient will ambulate 3000'  w/o increase pain in order to go to the store   Baseline unable to walk more then 10-15 minutes   Time 8   Period Weeks   Status New   PT LONG TERM GOAL #2   Title Patient will sit for 2 hours at work without increased pain    Baseline Can not sit for more then 20 minutes    Time 8   Period Weeks   Status New   PT LONG TERM GOAL #3   Title Patient will use left arm for IADL's without pain    Baseline pain when using her arm    Time 8   Period Weeks   Status New   PT LONG TERM GOAL #4   Title Patient will carry object without pain in order to perfrom work tasks.    Baseline Pain when carrying objects    Time 8   Period Weeks   Status New               Plan - 11/06/15 1224    Clinical Impression Statement Pt reports 3/10 pain and was instructed in left triceps extension and eccentric loading with red flex bar for exericies when pain is 3/10 or less. Pt consented to trigger point dry needling. Multiple twitches over left common extensor , supinator/pronator and left triceps.  Pt  is making significant progress with dry needling and may benefit from max 2-4  additionaly appt to maximize pain control.  Pt is able to tolerate 3 lb wt. Lorayne Bender PT will assess next visit.    Rehab Potential Good   PT Frequency 2x / week   PT Duration 8 weeks   PT Treatment/Interventions ADLs/Self Care Home Management;Cryotherapy;Electrical Stimulation;Iontophoresis /ml Dexamethasone;Moist Heat;Therapeutic exercise;Therapeutic activities;Functional mobility training;Stair training;Gait training;Neuromuscular re-education;Cognitive remediation;Patient/family education;Manual techniques;Passive range of motion;Dry needling   PT Next Visit Plan Continue strengthening.  for up to 4 additional appt to include dry needling., write Progress note as needed.    PT Home Exercise Plan elbow ext with 3 lb wt., red flex bar (borrowed by PT)      Patient will benefit from skilled therapeutic intervention  in order to improve the following deficits and impairments:  Abnormal gait, Decreased activity tolerance, Decreased mobility, Decreased strength, Postural dysfunction, Impaired flexibility, Decreased endurance, Increased muscle spasms, Difficulty walking, Decreased range of motion  Visit Diagnosis: Pain in left elbow  Muscle weakness (generalized)     Problem List Patient Active Problem List   Diagnosis Date Noted  . PTSD (post-traumatic stress disorder) 08/18/2011  . DM (diabetes mellitus) ([name]) 08/18/2011  . Sleep apnea 08/18/2011  . Mood disorder ([name]) 08/18/2011   Garen LahLawrie Beardsley, PT 11/06/2015 12:57 PM Phone: [phone] Fax: [phone]  [name] [address] [place], [place], [address] Phone: [phone]   Fax:  [phone]  Name: Erika Woods MRN: [id number] Date of Birth: [birth year]

## 2015-11-06 NOTE — Patient Instructions (Addendum)
Extension (Active)    Lying on back or sitting, raise elbow up and out. Straighten arm without moving shoulder. Can use other hand to hold upper arm steady. Hold ____ seconds. Repeat _2 x 10___ times with 3 lb. Do __1-2__ sessions per day.  Trigger Point Dry Needling  . What is Trigger Point Dry Needling (DN)? o DN is a physical therapy technique used to treat muscle pain and dysfunction. Specifically, DN helps deactivate muscle trigger points (muscle knots).  o A thin filiform needle is used to penetrate the skin and stimulate the underlying trigger point. The goal is for a local twitch response (LTR) to occur and for the trigger point to relax. No medication of any kind is injected during the procedure.   . What Does Trigger Point Dry Needling Feel Like?  o The procedure feels different for each individual patient. Some patients report that they do not actually feel the needle enter the skin and overall the process is not painful. Very mild bleeding may occur. However, many patients feel a deep cramping in the muscle in which the needle was inserted. This is the local twitch response.   Marland Kitchen. How Will I feel after the treatment? o Soreness is normal, and the onset of soreness may not occur for a few hours. Typically this soreness does not last longer than two days.  o Bruising is uncommon, however; ice can be used to decrease any possible bruising.  o In rare cases feeling tired or nauseous after the treatment is normal. In addition, your symptoms may get worse before they get better, this period will typically not last longer than 24 hours.   . What Can I do After My Treatment? o Increase your hydration by drinking more water for the next 24 hours. o You may place ice or heat on the areas treated that have become sore, however, do not use heat on inflamed or bruised areas. Heat often brings more relief post needling. o You can continue your regular activities, but vigorous activity is not  recommended initially after the treatment for 24 hours. DN is best combined with other physical therapy such as strengthening, stretching, and other therapies.   Copyright  VHI. All rights reserved.   Garen LahLawrie Teonna Coonan, PT 11/06/2015 11:24 AM Phone: [phone] Fax: [phone]

## 2015-11-08 ENCOUNTER — Ambulatory Visit: Payer: Managed Care, Other (non HMO) | Admitting: Physical Therapy

## 2015-11-08 DIAGNOSIS — M6281 Muscle weakness (generalized): Secondary | ICD-10-CM

## 2015-11-08 DIAGNOSIS — M25551 Pain in right hip: Secondary | ICD-10-CM

## 2015-11-08 DIAGNOSIS — M25522 Pain in left elbow: Secondary | ICD-10-CM

## 2015-11-08 DIAGNOSIS — R262 Difficulty in walking, not elsewhere classified: Secondary | ICD-10-CM

## 2015-11-08 NOTE — Patient Instructions (Signed)
  Bridge Pose WITH CLAM   Press small of back into mat, maintain pelvic tilt, roll up one vertebrae at a time. Focus on engaging posterior hip muscles. OPEN AND CLOSE KNEES ONE TIME AND THEN LOWER HIPS. Repeat __10__ times.      Bridge Pose, One Leg   Bring knee to chest. Roll up from tailbone to bridge pose on supporting leg. Focus on engaging posterior hip muscles. Hold for __1__ breaths. Repeat ____10  times each leg.

## 2015-11-08 NOTE — Therapy (Signed)
[place], [place], [address] Phone: [phone]   Fax:  [phone]  Physical Therapy Treatment  Patient Details  Name: Erika Woods MRN: [id number] Date of Birth: [birth year] Referring Provider: Dr Micheline Chapman   Encounter Date: 11/08/2015      PT End of Session - 11/08/15 1238    Visit Number 8   Number of Visits 16   Date for PT Re-Evaluation 12/05/15   Authorization Type Aetna    PT Start Time 1148   PT Stop Time 1245   PT Time Calculation (min) 57 min      Past Medical History  Diagnosis Date  . Diabetes mellitus without complication [facility])     Past Surgical History  Procedure Laterality Date  . Tonsillectomy    . Kidney stone surgery    . Dilation and evacuation    . Arm surgery Bilateral Skin removal    There were no vitals filed for this visit.      Subjective Assessment - 11/08/15 1314    Subjective I was sore after last treatment but it helped.    Currently in Pain? No/denies   Aggravating Factors  reaching, llifting purse   Pain Relieving Factors rest            [facility] PT Assessment - 11/08/15 0001    Strength   Overall Strength Comments grip 40 left, 55 right   Left Wrist Flexion 4+/5  no pain   Left Wrist Extension 4-/5  pain   Right/Left Hip Right;Left   Right Hip Flexion 4/5   Right Hip ABduction 3+/5   Left Hip Flexion 4/5   Left Hip ABduction 4-/5                     [facility] Adult PT Treatment/Exercise - 11/08/15 0001    Knee/Hip Exercises: Supine   Bridges Limitations 2x10  one set with a clam   Single Leg Bridge 10 reps   Knee/Hip Exercises: Sidelying   Hip ABduction 10 reps;3 sets   Hip ABduction Limitations 2#   Wrist Exercises   Other wrist exercises supine tricep extension AROM x10 1 # x 15   Other wrist exercises Velcro pad wrist flexion/extension, handle turns.    Modalities   Modalities Moist Heat   Moist Heat Therapy   Number  Minutes Moist Heat 15 Minutes   Moist Heat Location Elbow  forearm   Ultrasound   Ultrasound Location left forearm   Ultrasound Parameters 1.5 w/cm2   Ultrasound Goals Pain                PT Education - 11/08/15 1237    Education provided Yes   Education Details Supine bridge with clam, single leg bridge   Person(s) Educated Patient   Methods Explanation;Handout   Comprehension Verbalized understanding          PT Short Term Goals - 11/08/15 1154    PT SHORT TERM GOAL #1   Title Patient will demsotrate full left passive elbow and wrist motion without pain    Baseline supination and flexion still pain   Time 4   Period Weeks   Status Partially Met   PT SHORT TERM GOAL #2   Title Patient will report decreased tenderness to palaption in her  left lateral epicondyle and right greater troachnter.    Baseline no tenderness hip, much better at elbow   Time 4   Period Weeks  Status Partially Met   PT SHORT TERM GOAL #3   Title Patient will increase left elbow and wrist strength to 5/5    Time 4   Period Weeks   Status On-going   PT SHORT TERM GOAL #4   Title Patient will increase right hip strength to 4+/5 gross   Baseline 4/5 hip flexion,    Time 4   Period Weeks   Status On-going   PT SHORT TERM GOAL #5   Title Patient will increase left grip strength by 20 lbs    Baseline increased by 10ls    Time 4   Period Weeks   Status On-going           PT Long Term Goals - 11/08/15 1416    PT LONG TERM GOAL #1   Title Patient will ambulate 3000' w/o increase pain in order to go to the store   Status Achieved   PT [place] #2   Title Patient will sit for 2 hours at work without increased pain    Status Achieved   PT LONG TERM GOAL #3   Title Patient will use left arm for IADL's without pain    Status On-going   PT LONG TERM GOAL #4   Title Patient will carry object without pain in order to perfrom work tasks.    Baseline pain with carrying purse     Time 8   Period Weeks   Status On-going               Plan - 11/08/15 1152    Clinical Impression Statement Supination and elbow flexion 2/10 pain with AROM. Elbow extension 0 passive rom. Decreased tenderness on lateral epicondyle and no more tenderness at greater trochanter. Pt is no longer limited by hip pain. She demonstrates decreased hip strength right more than left. Advanced hip strengthening to include single leg bridges and asked her increase her reps with sidelying abduction at home. Grip strength improved 10# on left. Reviewed pts tricep extension exercises and added 1# Without pain.  She has bruising associated with last visit perhaps from needling or deep pressure massage. Ultrasound used to improve circulation and decrease tenderness/ bruising to her lower arm. HMP repeated as pt reports it was very helpful last visit. STG#1,#2 partially met. LTG#1,#2 Met.    PT Next Visit Plan Continue strengthening, review bridge series, hip abduction, wrist strength as tolerated ( up to 4 additional appt to include dry needling)      Patient will benefit from skilled therapeutic intervention in order to improve the following deficits and impairments:  Abnormal gait, Decreased activity tolerance, Decreased mobility, Decreased strength, Postural dysfunction, Impaired flexibility, Decreased endurance, Increased muscle spasms, Difficulty walking, Decreased range of motion  Visit Diagnosis: Pain in left elbow  Muscle weakness (generalized)  Pain in right hip  Difficulty in walking, not elsewhere classified     Problem List Patient Active Problem List   Diagnosis Date Noted  . PTSD (post-traumatic stress disorder) 08/18/2011  . DM (diabetes mellitus) ([place]) 08/18/2011  . Sleep apnea 08/18/2011  . Mood disorder ([place]) 08/18/2011    Dorene Ar, PTA 11/08/2015, 2:18 PM  [place], [place],  [address] Phone: [phone]   Fax:  [phone]  Name: Erika Woods MRN: [id number] Date of Birth: 12/26/1965

## 2015-11-14 ENCOUNTER — Encounter: Payer: Managed Care, Other (non HMO) | Admitting: Physical Therapy

## 2015-11-15 ENCOUNTER — Ambulatory Visit: Payer: Managed Care, Other (non HMO) | Attending: Sports Medicine | Admitting: Physical Therapy

## 2015-11-15 DIAGNOSIS — M6281 Muscle weakness (generalized): Secondary | ICD-10-CM | POA: Diagnosis present

## 2015-11-15 DIAGNOSIS — M25522 Pain in left elbow: Secondary | ICD-10-CM

## 2015-11-15 DIAGNOSIS — R262 Difficulty in walking, not elsewhere classified: Secondary | ICD-10-CM | POA: Diagnosis present

## 2015-11-15 DIAGNOSIS — M25551 Pain in right hip: Secondary | ICD-10-CM | POA: Diagnosis present

## 2015-11-15 NOTE — Therapy (Signed)
[place], [place], [address] Phone: [phone]   Fax:  [phone]  Physical Therapy Treatment  Patient Details  Name: Erika Woods MRN: [id number] Date of Birth: 15-Apr-1966 Referring Provider: Dr Micheline Chapman   Encounter Date: 11/15/2015      PT End of Session - 11/15/15 1330    Visit Number 9   Number of Visits 16   Date for PT Re-Evaluation 12/05/15   Authorization Type Aetna    PT Start Time 1155   PT Stop Time 1255   PT Time Calculation (min) 60 min   Activity Tolerance Patient tolerated treatment well      Past Medical History  Diagnosis Date  . Diabetes mellitus without complication [facility])     Past Surgical History  Procedure Laterality Date  . Tonsillectomy    . Kidney stone surgery    . Dilation and evacuation    . Arm surgery Bilateral Skin removal    There were no vitals filed for this visit.      Subjective Assessment - 11/15/15 1156    Subjective I was at the beach. I didnt do as much exericise but I did walk at the beach.   My hip was fine not any worse or better.  My left arm is 3/10   Pertinent History No history of right hip pain in the past. Incidious onset of right hip pain. 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 The hip gets worse when she sits for long periods of time.    Limitations Sitting;Walking   Patient Stated Goals Straighten out the elbow completley without pain. Improve left arm mobility. Less pain with the hip.    Currently in Pain? Yes   Pain Score 4    Pain Location Elbow   Pain Orientation Left   Pain Descriptors / Indicators Aching;Dull   Pain Score 1   Pain Location Hip   Pain Orientation Right   Pain Descriptors / Indicators Aching                         [facility] Adult PT Treatment/Exercise - 11/15/15 1202    Self-Care   Self-Care Posture;Lifting   Lifting care for extended lever arm in lifting  purse and items.  avoid extension.    Posture Center of gravity changed since wt loss, discussed importance of proper posture/shoulder / neck placement   Elbow Exercises   Wrist Extension Limitations 2 lb 20 x   Other elbow exercises triceps ext  2 lb in supine  10 x 2   Other elbow exercises flex bar 10 x  x 2eccentric with VC constantly  Pt given red flex bar to borrow from [facility] PT   Wrist Exercises   Other wrist exercises supine tricep extension AROM x10 2# x 15   Other wrist exercises Velcro pad wrist flexion/extension, handle turns.    Modalities   Modalities Moist Heat   Moist Heat Therapy   Number Minutes Moist Heat 15 Minutes   Moist Heat Location Elbow  forearm   Ultrasound   Ultrasound Location left forearm   Ultrasound Parameters 1.5 w/ cm 2    Ultrasound Goals Pain   Manual Therapy   Manual Therapy Soft tissue mobilization   Soft tissue mobilization over left forearm extensors, triceps, supination and pronation   Kinesiotex Create Space   Kinesiotix   Create Space compression  tape X over lateral epicondyle with 100% stretch compression          Trigger Point Dry Needling - 11/15/15 1208    Consent Given? Yes   Education Handout Provided No  previously given   Muscles Treated Upper Body --  left commom extensor,sup/pron and triceps                PT Short Term Goals - 11/08/15 1154    PT SHORT TERM GOAL #1   Title Patient will demsotrate full left passive elbow and wrist motion without pain    Baseline supination and flexion still pain   Time 4   Period Weeks   Status Partially Met   PT SHORT TERM GOAL #2   Title Patient will report decreased tenderness to palaption in her  left lateral epicondyle and right greater troachnter.    Baseline no tenderness hip, much better at elbow   Time 4   Period Weeks   Status Partially Met   PT SHORT TERM GOAL #3   Title Patient will increase left elbow and wrist strength to 5/5    Time 4    Period Weeks   Status On-going   PT SHORT TERM GOAL #4   Title Patient will increase right hip strength to 4+/5 gross   Baseline 4/5 hip flexion,    Time 4   Period Weeks   Status On-going   PT SHORT TERM GOAL #5   Title Patient will increase left grip strength by 20 lbs    Baseline increased by 10ls    Time 4   Period Weeks   Status On-going           PT Long Term Goals - 11/08/15 1416    PT LONG TERM GOAL #1   Title Patient will ambulate 3000' w/o increase pain in order to go to the store   Status Achieved   PT [place] #2   Title Patient will sit for 2 hours at work without increased pain    Status Achieved   PT LONG TERM GOAL #3   Title Patient will use left arm for IADL's without pain    Status On-going   PT LONG TERM GOAL #4   Title Patient will carry object without pain in order to perfrom work tasks.    Baseline pain with carrying purse    Time 8   Period Weeks   Status On-going               Plan - 11/15/15 1331    Clinical Impression Statement Pt with 4/10 elbow flexion/supination pain.  Pt was doing better but then went on vacation and did not consistently do arm exericises but did walk on the beach.  Pt grip strength was improved last visit and pt requested /consented to trigger point dry needling today.  Pt also recieved KT tape compression over left lateral epicondylitis.  Pt with no pain on elbow extension and minimal 1/10 pain on flexion after treatment..  will continue for remainder of 3 treatments to maximiize strenghth and function of hip and left elbow.   Rehab Potential Good   PT Frequency 2x / week   PT Duration 8 weeks   PT Treatment/Interventions ADLs/Self Care Home Management;Cryotherapy;Electrical Stimulation;Iontophoresis 33m/ml Dexamethasone;Moist Heat;Therapeutic exercise;Therapeutic activities;Functional mobility training;Stair training;Gait training;Neuromuscular re-education;Cognitive remediation;Patient/family education;Manual  techniques;Passive range of motion;Dry needling   PT Next Visit Plan Continue strengthening, review bridge series, hip abduction, wrist strength as tolerated (  up to 3 additional appt to include dry needling)   PT Home Exercise Plan elbow ext with 3 lb wt., red flex bar (borrowed by PT)   Consulted and Agree with Plan of Care Patient      Patient will benefit from skilled therapeutic intervention in order to improve the following deficits and impairments:  Abnormal gait, Decreased activity tolerance, Decreased mobility, Decreased strength, Postural dysfunction, Impaired flexibility, Decreased endurance, Increased muscle spasms, Difficulty walking, Decreased range of motion  Visit Diagnosis: Pain in left elbow  Muscle weakness (generalized)  Pain in right hip  Difficulty in walking, not elsewhere classified     Problem List Patient Active Problem List   Diagnosis Date Noted  . PTSD (post-traumatic stress disorder) 08/18/2011  . DM (diabetes mellitus) ([place]) 08/18/2011  . Sleep apnea 08/18/2011  . Mood disorder ([place]) 08/18/2011    Voncille Lo, PT 11/15/2015 1:38 PM Phone: [phone] Fax: [facility] [place], [place], [address] Phone: [phone]   Fax:  [phone]  Name: SHAHIDAH NESBITT MRN: [id number] Date of Birth: [birth year]

## 2015-11-16 ENCOUNTER — Ambulatory Visit: Payer: Managed Care, Other (non HMO) | Admitting: Physical Therapy

## 2015-11-16 DIAGNOSIS — M6281 Muscle weakness (generalized): Secondary | ICD-10-CM

## 2015-11-16 DIAGNOSIS — R262 Difficulty in walking, not elsewhere classified: Secondary | ICD-10-CM

## 2015-11-16 DIAGNOSIS — M25522 Pain in left elbow: Secondary | ICD-10-CM | POA: Diagnosis not present

## 2015-11-16 DIAGNOSIS — M25551 Pain in right hip: Secondary | ICD-10-CM

## 2015-11-16 NOTE — Therapy (Signed)
[place], [place], [address] Phone: [phone]   Fax:  [phone]  Physical Therapy Treatment  Patient Details  Name: Erika Woods MRN: [id number] Date of Birth: 06/15/1965 Referring Provider: Dr Micheline Chapman   Encounter Date: 11/16/2015      PT End of Session - 11/16/15 1130    Visit Number 10   Number of Visits 16   Date for PT Re-Evaluation 12/05/15   Authorization Type Aetna    PT Start Time 1021   PT Stop Time 1100   PT Time Calculation (min) 39 min   Activity Tolerance Patient tolerated treatment well   Behavior During Therapy [facility] for tasks assessed/performed      Past Medical History  Diagnosis Date  . Diabetes mellitus without complication [facility])     Past Surgical History  Procedure Laterality Date  . Tonsillectomy    . Kidney stone surgery    . Dilation and evacuation    . Arm surgery Bilateral Skin removal    There were no vitals filed for this visit.      Subjective Assessment - 11/15/15 1156    Subjective I was at the beach. I didnt do as much exericise but I did walk at the beach.   My hip was fine not any worse or better.  My left arm is 3/10   Pertinent History No history of right hip pain in the past. Incidious onset of right hip pain. 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 The hip gets worse when she sits for long periods of time.    Limitations Sitting;Walking   Patient Stated Goals Straighten out the elbow completley without pain. Improve left arm mobility. Less pain with the hip.    Currently in Pain? Yes   Pain Score 4    Pain Location Elbow   Pain Orientation Left   Pain Descriptors / Indicators Aching;Dull   Pain Score 1   Pain Location Hip   Pain Orientation Right   Pain Descriptors / Indicators Aching                         [facility] Adult PT Treatment/Exercise - 11/16/15 0001    Elbow Exercises   Elbow  Extension Limitations 3lb 2x10    Forearm Pronation Limitations 3lb 20x   Wrist Flexion Limitations 3lb 20x    Other elbow exercises triceps ext  2 lb in supine  10 x 2   Other elbow exercises flex bar 10 x  x 2eccentric with VC constantly  Pt given red flex bar to borrow from [employer] PT   Shoulder Exercises: Standing   Extension Limitations 2x10 red    Row Limitations 2x10 red    Other Standing Exercises triceps extension 2x10 red    Wrist Exercises   Other wrist exercises Velcro pad wrist flexion/extension, handle turns.    Iontophoresis   Type of Iontophoresis Dexamethasone   Location left lateral epicondyle    Dose 1cc    Time 4-6 HOUR PATCH   Manual Therapy   Manual Therapy Soft tissue mobilization   Soft tissue mobilization over left forearm extensors, triceps, supination and pronation   Kinesiotex Create Space          Trigger Point Dry Needling - 11/15/15 1208    Consent Given? Yes   Education Handout Provided No  previously given   Muscles Treated Upper  Body --  left commom extensor,sup/pron and triceps              PT Education - 11/16/15 1129    Education provided Yes   Education Details continue with wrist and elbow stregthening within pain free limits.    Person(s) Educated Patient   Methods Explanation;Handout   Comprehension Verbalized understanding          PT Short Term Goals - 11/16/15 1131    PT SHORT TERM GOAL #1   Title Patient will demsotrate full left passive elbow and wrist motion without pain    Baseline still has pain with flexion    Time 4   Period Weeks   Status Partially Met   PT SHORT TERM GOAL #2   Title Patient will report decreased tenderness to palaption in her  left lateral epicondyle and right greater troachnter.    Baseline difficxult tofind spot at the elbow   Time 4   Period Weeks   Status Partially Met   PT SHORT TERM GOAL #3   Title Patient will increase left elbow and wrist strength to 5/5     Baseline Not tested    Time 4   Period Weeks   Status On-going   PT SHORT TERM GOAL #4   Title Patient will increase right hip strength to 4+/5 gross   Baseline 4/5 hip flexion,    Time 4   Period Weeks   Status On-going   PT SHORT TERM GOAL #5   Title Patient will increase left grip strength by 20 lbs    Baseline increased by 10ls    Time 4   Period Weeks   Status On-going           PT Long Term Goals - 11/16/15 1134    PT LONG TERM GOAL #5   Title Patient will grip object with left hand without significant increase in pain    Time 8   Period Weeks   Status New               Plan - 11/15/15 1331    Clinical Impression Statement Pt with 4/10 elbow flexion/supination pain.  Pt was doing better but then went on vacation and did not consistently do arm exericises but did walk on the beach.  Pt grip strength was improved last visit and pt requested /consented to trigger point dry needling today.  Pt also recieved KT tape compression over left lateral epicondylitis.  Pt with no pain on elbow extension and minimal 1/10 pain on flexion after treatment..  will continue for remainder of 3 treatments to maximiize strenghth and function of hip and left elbow.   Rehab Potential Good   PT Frequency 2x / week   PT Duration 8 weeks   PT Treatment/Interventions ADLs/Self Care Home Management;Cryotherapy;Electrical Stimulation;Iontophoresis 41m/ml Dexamethasone;Moist Heat;Therapeutic exercise;Therapeutic activities;Functional mobility training;Stair training;Gait training;Neuromuscular re-education;Cognitive remediation;Patient/family education;Manual techniques;Passive range of motion;Dry needling   PT Next Visit Plan Continue strengthening, review bridge series, hip abduction, wrist strength as tolerated ( up to 3 additional appt to include dry needling)   PT Home Exercise Plan elbow ext with 3 lb wt., red flex bar (borrowed by PT)   Consulted and Agree with Plan of Care Patient       Patient will benefit from skilled therapeutic intervention in order to improve the following deficits and impairments:     Visit Diagnosis: Pain in left elbow  Pain in right hip  Difficulty in walking, not  elsewhere classified  Muscle weakness (generalized)     Problem List Patient Active Problem List   Diagnosis Date Noted  . PTSD (post-traumatic stress disorder) 08/18/2011  . DM (diabetes mellitus) ([place]) 08/18/2011  . Sleep apnea 08/18/2011  . Mood disorder ([place]) 08/18/2011    Carney Living PT DPT  11/16/2015, 11:37 AM  [facility] [address] [place], [place], [address] Phone: [phone]   Fax:  [phone]  Name: Erika Woods MRN: [id number] Date of Birth: 1965/08/20

## 2015-11-27 ENCOUNTER — Encounter: Payer: Managed Care, Other (non HMO) | Admitting: Physical Therapy

## 2015-11-29 ENCOUNTER — Encounter: Payer: Managed Care, Other (non HMO) | Admitting: Physical Therapy

## 2015-11-29 ENCOUNTER — Ambulatory Visit: Payer: Managed Care, Other (non HMO) | Admitting: Physical Therapy

## 2015-11-29 DIAGNOSIS — R262 Difficulty in walking, not elsewhere classified: Secondary | ICD-10-CM

## 2015-11-29 DIAGNOSIS — M25551 Pain in right hip: Secondary | ICD-10-CM

## 2015-11-29 DIAGNOSIS — M25522 Pain in left elbow: Secondary | ICD-10-CM

## 2015-11-29 DIAGNOSIS — M6281 Muscle weakness (generalized): Secondary | ICD-10-CM

## 2015-11-29 NOTE — Therapy (Signed)
[place], [place], [address] Phone: [phone]   Fax:  [phone]  Physical Therapy Treatment  Patient Details  Name: Erika Woods MRN: [id number] Date of Birth: March 21, 1966 Referring Provider: Dr Micheline Chapman   Encounter Date: 11/29/2015      PT End of Session - 11/29/15 0941    Visit Number 11   Number of Visits 16   Date for PT Re-Evaluation 12/05/15   Authorization Type Aetna    PT Start Time 0940  patient arrived 8 minutes late for visit   PT Stop Time 1030   PT Time Calculation (min) 50 min   Activity Tolerance Patient tolerated treatment well   Behavior During Therapy [facility] for tasks assessed/performed      Past Medical History  Diagnosis Date  . Diabetes mellitus without complication [facility])     Past Surgical History  Procedure Laterality Date  . Tonsillectomy    . Kidney stone surgery    . Dilation and evacuation    . Arm surgery Bilateral Skin removal    There were no vitals filed for this visit.      Subjective Assessment - 11/29/15 0942    Subjective I have been trying to do more of my exericise.  My forearm does not hurt but the only pain point is directly over my bone (lateral epicondylitis) over my left elbow   Pertinent History No history of right hip pain in the past. Incidious onset of right hip pain. 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 The hip gets worse when she sits for long periods of time.    Limitations Sitting;Walking   Diagnostic tests x-ray:L unremarkable.    Patient Stated Goals Straighten out the elbow completley without pain. Improve left arm mobility. Less pain with the hip.    Currently in Pain? Yes   Pain Score 2    Pain Location Elbow   Pain Orientation Left   Pain Descriptors / Indicators Aching;Dull   Pain Type Chronic pain   Pain Onset More than a month ago   Pain Score 1   Pain Location Hip   Pain Orientation  Right   Pain Descriptors / Indicators Aching                         [facility] Adult PT Treatment/Exercise - 11/29/15 0945    Elbow Exercises   Other elbow exercises triceps ext  3 lb in supine  10 x 2   Other elbow exercises flex bar 10 x  x 2eccentric with VC constantly  Pt given red flex bar to borrow from Voncille Lo PT   Shoulder Exercises: Standing   Extension Both;10 reps  x2   Theraband Level (Shoulder Extension) Level 3 (Green)   Extension Limitations --   Row 10 reps;Both  2 x 10   Theraband Level (Shoulder Row) Level 3 (Green)   Row Limitations no pain   Other Standing Exercises triceps extension 2x10 green    Wrist Exercises   Other wrist exercises Velcro pad wrist flexion/extension, handle turns.    Moist Heat Therapy   Number Minutes Moist Heat 15 Minutes   Moist Heat Location Elbow  left   Manual Therapy   Manual Therapy Soft tissue mobilization   Soft tissue mobilization over left forearm extensors, triceps, supination and pronation   [place]  compression x over lateral epicondyle star pattern          Trigger Point Dry Needling - 11/29/15 0956    Consent Given? Yes   Education Handout Provided No  previously given   Muscles Treated Upper Body --  Tricep Left twitch response                PT Short Term Goals - 11/16/15 1131    PT SHORT TERM GOAL #1   Title Patient will demsotrate full left passive elbow and wrist motion without pain    Baseline still has pain with flexion    Time 4   Period Weeks   Status Partially Met   PT SHORT TERM GOAL #2   Title Patient will report decreased tenderness to palaption in her  left lateral epicondyle and right greater troachnter.    Baseline difficxult tofind spot at the elbow   Time 4   Period Weeks   Status Partially Met   PT SHORT TERM GOAL #3   Title Patient will increase left elbow and wrist strength to 5/5    Baseline Not tested     Time 4   Period Weeks   Status On-going   PT SHORT TERM GOAL #4   Title Patient will increase right hip strength to 4+/5 gross   Baseline 4/5 hip flexion,    Time 4   Period Weeks   Status On-going   PT SHORT TERM GOAL #5   Title Patient will increase left grip strength by 20 lbs    Baseline increased by 10ls    Time 4   Period Weeks   Status On-going           PT Long Term Goals - 11/16/15 1134    PT LONG TERM GOAL #5   Title Patient will grip object with left hand without significant increase in pain    Time 8   Period Weeks   Status New               Plan - 11/29/15 1107    Clinical Impression Statement Pt with decreased pain in left elbow 2/10 and 1/10 in common forearm extensors at presentation this visit.  Pt performing exericise with no pain except for triceps extension of left arm.  After treatment, pt was 1/10  Pt  consented to trigger point dry needling for triceps and did not need  for common forearm extensors.   Pt progressed to green t band and 3 lb wts for exercises   Rehab Potential Good   PT Frequency 2x / week   PT Duration 8 weeks   PT Treatment/Interventions ADLs/Self Care Home Management;Cryotherapy;Electrical Stimulation;Iontophoresis 62m/ml Dexamethasone;Moist Heat;Therapeutic exercise;Therapeutic activities;Functional mobility training;Stair training;Gait training;Neuromuscular re-education;Cognitive remediation;Patient/family education;Manual techniques;Passive range of motion;Dry needling   PT Next Visit Plan Pt doing well and may be discharged next visit. Plan of care is up on 12-05-15 and needs goals reassessed.  Assess KT tape star compression and teach to self apply if beneficial.     PT Home Exercise Plan elbow ext with 3 lb wt., red flex bar (borrowed by PT)      Patient will benefit from skilled therapeutic intervention in order to improve the following deficits and impairments:  Abnormal gait, Decreased activity tolerance, Decreased  mobility, Decreased strength, Postural dysfunction, Impaired flexibility, Decreased endurance, Increased muscle spasms, Difficulty walking, Decreased range of motion  Visit Diagnosis: Pain in left elbow  Pain in right hip  Difficulty in walking,  not elsewhere classified  Muscle weakness (generalized)     Problem List Patient Active Problem List   Diagnosis Date Noted  . PTSD (post-traumatic stress disorder) 08/18/2011  . DM (diabetes mellitus) ([place]) 08/18/2011  . Sleep apnea 08/18/2011  . Mood disorder ([place]) 08/18/2011    Voncille Lo, PT 11/29/2015 12:50 PM Phone: [phone] Fax: [facility] [address] [place], [place], [address] Phone: [phone]   Fax:  [phone]  Name: Erika Woods MRN: [id number] Date of Birth: 1966-03-26

## 2015-11-29 NOTE — Patient Instructions (Signed)
  Garen LahLawrie Beardsley, PT 11/29/2015 10:19 AM Phone: [phone] Fax: [phone]

## 2015-12-04 ENCOUNTER — Ambulatory Visit: Payer: Managed Care, Other (non HMO) | Admitting: Physical Therapy

## 2015-12-04 DIAGNOSIS — M6281 Muscle weakness (generalized): Secondary | ICD-10-CM

## 2015-12-04 DIAGNOSIS — M25522 Pain in left elbow: Secondary | ICD-10-CM

## 2015-12-04 DIAGNOSIS — R262 Difficulty in walking, not elsewhere classified: Secondary | ICD-10-CM

## 2015-12-04 DIAGNOSIS — M25551 Pain in right hip: Secondary | ICD-10-CM

## 2015-12-04 NOTE — Therapy (Addendum)
[place], [place], [address] Phone: [phone]   Fax:  [phone]  Physical Therapy Re-cert  Patient Details  Name: Erika Woods MRN: [id number] Date of Birth: September 04, 1965 Referring Provider: Dr Micheline Chapman   Encounter Date: 12/04/2015      PT End of Session - 12/04/15 1429    Visit Number 12   Number of Visits 16   Date for PT Re-Evaluation 01/01/16   Authorization Type Aetna    PT Start Time 1330   PT Stop Time 1412   PT Time Calculation (min) 42 min   Activity Tolerance Patient tolerated treatment well   Behavior During Therapy [facility] for tasks assessed/performed      Past Medical History:  Diagnosis Date  . Diabetes mellitus without complication [facility])     Past Surgical History:  Procedure Laterality Date  . arm surgery Bilateral Skin removal  . DILATION AND EVACUATION    . KIDNEY STONE SURGERY    . TONSILLECTOMY      There were no vitals filed for this visit.      Subjective Assessment - 12/04/15 1343    Subjective Patient reports very little pain over the past week. she feels a slight pain with activity. She has no pain today. She has a hard time finding the spot in her elobow at this time. she has had no pain in her hip.    Pertinent History No history of right hip pain in the past. Incidious onset of right hip pain. 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 The hip gets worse when she sits for long periods of time.    Limitations Sitting;Walking   How long can you stand comfortably? around 45 minutes    How long can you walk comfortably? extended periods of time increase the hip pain.    Diagnostic tests x-ray:L unremarkable.    Patient Stated Goals Straighten out the elbow completley without pain. Improve left arm mobility. Less pain with the hip.    Currently in Pain? No/denies   Pain Score --   Pain Location Elbow            [facility] PT  Assessment - 12/04/15 0001      Strength   Left Wrist Flexion 5/5   Left Wrist Extension 4+/5   Right Hip ABduction 5/5   Left Hip Flexion 5/5   Left Hip ABduction 5/5     Palpation   Palpation comment No tenderness to palpation in the hip at this time.                              PT Education - 12/04/15 1356    Education provided Yes   Education Details continue to progress exercises at home.    Person(s) Educated Patient   Methods Explanation;Handout   Comprehension Verbalized understanding          PT Short Term Goals - 12/04/15 1430      PT SHORT TERM GOAL #1   Title Patient will demsotrate full left passive elbow and wrist motion without pain    Baseline No pain with ROM    Time 4   Period Weeks   Status Achieved     PT SHORT TERM GOAL #2   Title Patient will report decreased tenderness to palaption in her  left lateral epicondyle and right greater troachnter.  Baseline difficult to find spot at the elbow   Time 4   Period Weeks   Status Achieved     PT SHORT TERM GOAL #3   Title Patient will increase left elbow and wrist strength to 5/5    Baseline 5/5 gross L UE strength    Period Weeks   Status Achieved     PT SHORT TERM GOAL #4   Title Patient will increase right hip strength to 4+/5 gross   Baseline 5/5 gross bilateral LE strength    Time 4   Period Weeks   Status On-going     PT SHORT TERM GOAL #5   Title Patient will increase left grip strength by 20 lbs    Baseline 65l lbs L 60 lbs right    Period Weeks   Status Achieved           PT Long Term Goals - 12/04/15 1440      PT LONG TERM GOAL #1   Title Patient will ambulate 3000' w/o increase pain in order to go to the store   Baseline [place] the beach for over 3000'    Time 8   Period Weeks   Status Achieved     PT LONG TERM GOAL #2   Title Patient will sit for 2 hours at work without increased pain    Baseline sitting without limit   Time 8   Period  Weeks   Status Achieved     PT LONG TERM GOAL #3   Title Patient will use left arm for IADL's without pain    Baseline using her arm without pain over the past week. Assess carryover    Time 8   Period Weeks   Status On-going     PT LONG TERM GOAL #4   Title Patient will carry object without pain in order to perfrom work tasks.    Baseline no pain carrying objects assess carryover    Period Weeks   Status On-going     PT LONG TERM GOAL #5   Title Patient will grip object with left hand without significant increase in pain    Baseline No pain with gripping. Assess carryobver.    Time 8   Period Weeks   Status On-going               Plan - 12/04/15 1546    Clinical Impression Statement Patient reports no pain over the last week. She still has a small area of tenderness.She will be going on vacation for 2 weeks. When she returns if she is not having any pain she will likely discharge. If she has pain she may have a few more visits of needling. She has met all short term goals. She has met most of her long term goals but carryover has to be assessed.    Rehab Potential Good   PT Frequency 2x / week   PT Duration 8 weeks   PT Treatment/Interventions ADLs/Self Care Home Management;Cryotherapy;Electrical Stimulation;Iontophoresis 37m/ml Dexamethasone;Moist Heat;Therapeutic exercise;Therapeutic activities;Functional mobility training;Stair training;Gait training;Neuromuscular re-education;Cognitive remediation;Patient/family education;Manual techniques;Passive range of motion;Dry needling   PT Next Visit Plan Pt doing well and may be discharged next visit. Plan of care is up on 12-05-15 and needs goals reassessed.  Assess KT tape star compression and teach to self apply if beneficial.     PT Home Exercise Plan elbow ext with 3 lb wt., red flex bar (borrowed by PT)   Consulted and Agree with Plan of  Care Patient      Patient will benefit from skilled therapeutic intervention in  order to improve the following deficits and impairments:  Abnormal gait, Decreased activity tolerance, Decreased mobility, Decreased strength, Postural dysfunction, Impaired flexibility, Decreased endurance, Increased muscle spasms, Difficulty walking, Decreased range of motion  Visit Diagnosis: Pain in left elbow - Plan: PT plan of care cert/re-cert  Pain in right hip - Plan: PT plan of care cert/re-cert  Difficulty in walking, not elsewhere classified - Plan: PT plan of care cert/re-cert  Muscle weakness (generalized) - Plan: PT plan of care cert/re-cert  PHYSICAL THERAPY DISCHARGE SUMMARY  Visits from Start of Care: 13  Current functional level related to goals / functional outcomes: Patient had met all short term goals and most fo her long term goals. She was to return if needed. She did not return to PT. D/C   Remaining deficits: Unknown   Education / Equipment: Unknown  Plan: Patient agrees to discharge.  Patient goals were partially met. Patient is being discharged due to not returning since the last visit.  ?????       Problem List Patient Active Problem List   Diagnosis Date Noted  . PTSD (post-traumatic stress disorder) 08/18/2011  . DM (diabetes mellitus) ([place]) 08/18/2011  . Sleep apnea 08/18/2011  . Mood disorder ([place]) 08/18/2011    Carney Living PT DPT  12/04/2015, 4:01 PM  [facility] [address] [place], [place], [address] Phone: [phone]   Fax:  [phone]  Name: VERENISE MOULIN MRN: [id number] Date of Birth: [birth year]

## 2016-01-31 ENCOUNTER — Emergency Department (HOSPITAL_COMMUNITY)
Admission: EM | Admit: 2016-01-31 | Discharge: 2016-02-01 | Disposition: A | Discharge status: Home / Self Care | Payer: Managed Care, Other (non HMO) | Attending: Emergency Medicine | Admitting: Emergency Medicine

## 2016-01-31 ENCOUNTER — Encounter (HOSPITAL_COMMUNITY): Payer: Self-pay | Admitting: *Deleted

## 2016-01-31 DIAGNOSIS — Z79899 Other long term (current) drug therapy: Secondary | ICD-10-CM | POA: Insufficient documentation

## 2016-01-31 DIAGNOSIS — E119 Type 2 diabetes mellitus without complications: Secondary | ICD-10-CM | POA: Diagnosis not present

## 2016-01-31 DIAGNOSIS — T50904A Poisoning by unspecified drugs, medicaments and biological substances, undetermined, initial encounter: Secondary | ICD-10-CM

## 2016-01-31 DIAGNOSIS — T424X4A Poisoning by benzodiazepines, undetermined, initial encounter: Secondary | ICD-10-CM | POA: Diagnosis present

## 2016-01-31 DIAGNOSIS — Z791 Long term (current) use of non-steroidal anti-inflammatories (NSAID): Secondary | ICD-10-CM | POA: Diagnosis not present

## 2016-01-31 DIAGNOSIS — Z7984 Long term (current) use of oral hypoglycemic drugs: Secondary | ICD-10-CM | POA: Diagnosis not present

## 2016-01-31 DIAGNOSIS — F4310 Post-traumatic stress disorder, unspecified: Secondary | ICD-10-CM | POA: Diagnosis present

## 2016-01-31 HISTORY — DX: Disorder of kidney and ureter, unspecified: N28.9

## 2016-01-31 LAB — COMPREHENSIVE METABOLIC PANEL
ALBUMIN: 4.1 g/dL (ref 3.5–5.0)
ALT: 17 U/L (ref 14–54)
ANION GAP: 9 (ref 5–15)
AST: 24 U/L (ref 15–41)
Alkaline Phosphatase: 75 U/L (ref 38–126)
BILIRUBIN TOTAL: 0.7 mg/dL (ref 0.3–1.2)
BUN: 14 mg/dL (ref 6–20)
CHLORIDE: 108 mmol/L (ref 101–111)
CO2: 22 mmol/L (ref 22–32)
Calcium: 8.9 mg/dL (ref 8.9–10.3)
Creatinine, Ser: 0.95 mg/dL (ref 0.44–1.00)
GFR calc Af Amer: >60 mL/min (ref >60)
GFR calc non Af Amer: >60 mL/min (ref >60)
GLUCOSE: 170 mg/dL — AB (ref 65–99)
POTASSIUM: 4 mmol/L (ref 3.5–5.1)
SODIUM: 139 mmol/L (ref 135–145)
TOTAL PROTEIN: 7.7 g/dL (ref 6.5–8.1)

## 2016-01-31 LAB — SALICYLATE LEVEL: Salicylate Lvl: <4 mg/dL (ref 2.8–30.0)

## 2016-01-31 LAB — CBC
HEMATOCRIT: 38.1 % (ref 36.0–46.0)
Hemoglobin: 13.2 g/dL (ref 12.0–15.0)
MCH: 29.8 pg (ref 26.0–34.0)
MCHC: 34.6 g/dL (ref 30.0–36.0)
MCV: 86 fL (ref 78.0–100.0)
PLATELETS: 377 10*3/uL (ref 150–400)
RBC: 4.43 MIL/uL (ref 3.87–5.11)
RDW: 14.6 % (ref 11.5–15.5)
WBC: 8.5 10*3/uL (ref 4.0–10.5)

## 2016-01-31 LAB — ACETAMINOPHEN LEVEL

## 2016-01-31 LAB — ETHANOL: Alcohol, Ethyl (B): <5 mg/dL (ref <5)

## 2016-01-31 NOTE — ED Notes (Signed)
Poison Control advised observation only for 4-6 hrs post ingestion; they also advised that pt could be cleared for DC whenever the MD felt that it was ok; no additional testing or monitoring required
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
## 2016-02-01 LAB — RAPID URINE DRUG SCREEN, HOSP PERFORMED
Amphetamines: NOT DETECTED
Barbiturates: NOT DETECTED
Benzodiazepines: POSITIVE — AB
Cocaine: NOT DETECTED
OPIATES: NOT DETECTED
Tetrahydrocannabinol: NOT DETECTED

## 2016-02-01 LAB — CBG MONITORING, ED: Glucose-Capillary: 117 mg/dL — ABNORMAL HIGH (ref 65–99)

## 2016-02-01 NOTE — ED Provider Notes (Signed)
[facility]-EMERGENCY DEPT Provider Note   CSN: [id number] Arrival date & time: 01/31/16  2248     History   Chief Complaint Chief Complaint  Patient presents with  . Ingestion    HPI Erika Woods is a 50 y.o. female.  50 yo F with a chief complaint of benzo overdose. Patient states that she took 10 Xanax tablets after having in intense outpatient therapy session. She denies that this was an attempt to take her life. Medially after that she called her psychiatrist who suggested she come the EMS to the emergency department. Patient has felt sleepy but denies other significant symptoms. Denies coingestions. She took these about 2000 this evening.   The history is provided by the patient.  Illness  This is a new problem. The current episode started 3 to 5 hours ago. The problem occurs constantly. The problem has not changed since onset.Pertinent negatives include no chest pain, no headaches and no shortness of breath. Nothing aggravates the symptoms. Nothing relieves the symptoms. She has tried nothing for the symptoms. The treatment provided no relief.    Past Medical History:  Diagnosis Date  . Diabetes mellitus without complication ([name])   . Renal disorder    kideny stones    Patient Active Problem List   Diagnosis Date Noted  . PTSD (post-traumatic stress disorder) 08/18/2011  . DM (diabetes mellitus) ([name]) 08/18/2011  . Sleep apnea 08/18/2011  . Mood disorder ([name]) 08/18/2011    Past Surgical History:  Procedure Laterality Date  . arm surgery Bilateral Skin removal  . DILATION AND EVACUATION    . KIDNEY STONE SURGERY    . TONSILLECTOMY      OB History    No data available       Home Medications    Prior to Admission medications   Medication Sig Start Date End Date Taking? Authorizing Provider  ALPRAZolam Prudy Feeler) 0.5 MG tablet  06/26/15   Historical Provider, MD  clonazePAM (KLONOPIN) 1 MG tablet Take 1 mg by mouth 2 (two) times daily as needed for anxiety.     Historical Provider, MD  diclofenac (VOLTAREN) 75 MG EC tablet Take one tablet twice a day for 10 days, then take as needed.  Take with food 09/20/15   Ralene Cork, DO  ibuprofen (ADVIL,MOTRIN) 200 MG tablet Take 200 mg by mouth every 6 (six) hours as needed for pain.    Historical Provider, MD  metFORMIN (GLUCOPHAGE) 500 MG tablet Take by mouth. 10/28/13   Historical Provider, MD  Multiple Vitamin (MULTIVITAMIN) tablet Take 1 tablet by mouth daily.    Historical Provider, MD  oxyCODONE-acetaminophen (PERCOCET/ROXICET) 5-325 MG tablet  10/26/15   Historical Provider, MD  PRISTIQ 100 MG 24 hr tablet  08/06/15   Historical Provider, MD  rOPINIRole (REQUIP) 2 MG tablet  08/06/15   Historical Provider, MD    Family History No family history on file.  Social History Social History  Substance Use Topics  . Smoking status: Never Smoker  . Smokeless tobacco: Never Used  . Alcohol use Yes     Comment: special occassional - mixed drinks     Allergies   Sulfa antibiotics   Review of Systems Review of Systems  Constitutional: Positive for fatigue. Negative for chills and fever.  HENT: Negative for congestion and rhinorrhea.   Eyes: Negative for redness and visual disturbance.  Respiratory: Negative for shortness of breath and wheezing.   Cardiovascular: Negative for chest pain and palpitations.  Gastrointestinal: Negative for  nausea and vomiting.  Genitourinary: Negative for dysuria and urgency.  Musculoskeletal: Negative for arthralgias and myalgias.  Skin: Negative for pallor and wound.  Neurological: Negative for dizziness and headaches.     Physical Exam Updated Vital Signs BP 140/83   Pulse 79   Temp 97.8 F (36.6 C) (Oral)   Resp 18   LMP 01/29/2013   SpO2 97%   Physical Exam  Constitutional: She is oriented to person, place, and time. She appears well-developed and well-nourished. No distress.  HENT:  Head: Normocephalic and atraumatic.  Eyes: EOM are normal. Pupils  are equal, round, and reactive to light.  Neck: Normal range of motion. Neck supple.  Cardiovascular: Normal rate and regular rhythm.  Exam reveals no gallop and no friction rub.   No murmur heard. Pulmonary/Chest: Effort normal. She has no wheezes. She has no rales.  Abdominal: Soft. She exhibits no distension. There is no tenderness.  Musculoskeletal: She exhibits no edema or tenderness.  Neurological: She is alert and oriented to person, place, and time.  Skin: Skin is warm and dry. She is not diaphoretic.  Psychiatric: She has a normal mood and affect. Her behavior is normal.  Nursing note and vitals reviewed.    ED Treatments / Results  Labs (all labs ordered are listed, but only abnormal results are displayed) Labs Reviewed  COMPREHENSIVE METABOLIC PANEL - Abnormal; Notable for the following:       Result Value   Glucose, Bld 170 (*)    All other components within normal limits  ACETAMINOPHEN LEVEL - Abnormal; Notable for the following:    Acetaminophen (Tylenol), Serum <10 (*)    All other components within normal limits  ETHANOL  SALICYLATE LEVEL  CBC  URINE RAPID DRUG SCREEN, HOSP PERFORMED  CBG MONITORING, ED    EKG  EKG Interpretation None       Radiology No results found.  Procedures Procedures (including critical care time)  Medications Ordered in ED Medications - No data to display   Initial Impression / Assessment and Plan / ED Course  I have reviewed the triage vital signs and the nursing notes.  Pertinent labs & imaging results that were available during my care of the patient were reviewed by me and considered in my medical decision making (see chart for details).  Clinical Course    50 yo F With a chief complaint of a drug overdose. I'm not sure if this is an attempted take her life that sounds like this is a significant possibility. Poison control called and recommended 4-6 hour of obs.  Patient not significantly sleepy now, feel she is  medically cleared.  TTS evaluation.   The patients results and plan were reviewed and discussed.   Any x-rays performed were independently reviewed by myself.   Differential diagnosis were considered with the presenting HPI.  Medications - No data to display  Vitals:   01/31/16 2302  BP: 140/83  Pulse: 79  Resp: 18  Temp: 97.8 F (36.6 C)  TempSrc: Oral  SpO2: 97%    Final diagnoses:  Overdose, undetermined intent, initial encounter       Final Clinical Impressions(s) / ED Diagnoses   Final diagnoses:  Overdose, undetermined intent, initial encounter    New Prescriptions New Prescriptions   No medications on file     Melene Planan Nashae Maudlin, DO 02/01/16 0021
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
## 2016-12-23 ENCOUNTER — Emergency Department (HOSPITAL_COMMUNITY)
Admission: EM | Admit: 2016-12-23 | Discharge: 2016-12-25 | Disposition: A | Discharge status: Home / Self Care | Payer: BLUE CROSS/BLUE SHIELD | Attending: Emergency Medicine | Admitting: Emergency Medicine

## 2016-12-23 ENCOUNTER — Encounter (HOSPITAL_COMMUNITY): Payer: Self-pay

## 2016-12-23 DIAGNOSIS — E119 Type 2 diabetes mellitus without complications: Secondary | ICD-10-CM | POA: Insufficient documentation

## 2016-12-23 DIAGNOSIS — Z7984 Long term (current) use of oral hypoglycemic drugs: Secondary | ICD-10-CM | POA: Insufficient documentation

## 2016-12-23 DIAGNOSIS — F332 Major depressive disorder, recurrent severe without psychotic features: Secondary | ICD-10-CM | POA: Diagnosis not present

## 2016-12-23 DIAGNOSIS — Z79899 Other long term (current) drug therapy: Secondary | ICD-10-CM | POA: Insufficient documentation

## 2016-12-23 DIAGNOSIS — F4310 Post-traumatic stress disorder, unspecified: Secondary | ICD-10-CM | POA: Diagnosis present

## 2016-12-23 DIAGNOSIS — R45851 Suicidal ideations: Secondary | ICD-10-CM | POA: Diagnosis not present

## 2016-12-23 HISTORY — DX: Obstructive sleep apnea (adult) (pediatric): Z99.89

## 2016-12-23 HISTORY — DX: Obstructive sleep apnea (adult) (pediatric): G47.33

## 2016-12-23 LAB — RAPID URINE DRUG SCREEN, HOSP PERFORMED
AMPHETAMINES: NOT DETECTED
BARBITURATES: NOT DETECTED
BENZODIAZEPINES: POSITIVE — AB
COCAINE: NOT DETECTED
Opiates: NOT DETECTED
TETRAHYDROCANNABINOL: NOT DETECTED

## 2016-12-23 LAB — CBC WITH DIFFERENTIAL/PLATELET
BASOS PCT: 1 %
Basophils Absolute: 0.1 10*3/uL (ref 0.0–0.1)
EOS ABS: 0.4 10*3/uL (ref 0.0–0.7)
Eosinophils Relative: 5 %
HEMATOCRIT: 42.4 % (ref 36.0–46.0)
Hemoglobin: 13.9 g/dL (ref 12.0–15.0)
LYMPHS ABS: 2.4 10*3/uL (ref 0.7–4.0)
Lymphocytes Relative: 27 %
MCH: 29 pg (ref 26.0–34.0)
MCHC: 32.8 g/dL (ref 30.0–36.0)
MCV: 88.3 fL (ref 78.0–100.0)
MONO ABS: 0.4 10*3/uL (ref 0.1–1.0)
MONOS PCT: 5 %
Neutro Abs: 5.6 10*3/uL (ref 1.7–7.7)
Neutrophils Relative %: 64 %
Platelets: 263 10*3/uL (ref 150–400)
RBC: 4.8 MIL/uL (ref 3.87–5.11)
RDW: 14.4 % (ref 11.5–15.5)
WBC: 8.9 10*3/uL (ref 4.0–10.5)

## 2016-12-23 LAB — COMPREHENSIVE METABOLIC PANEL
ALBUMIN: 4.3 g/dL (ref 3.5–5.0)
ALT: 19 U/L (ref 14–54)
ANION GAP: 9 (ref 5–15)
AST: 20 U/L (ref 15–41)
Alkaline Phosphatase: 73 U/L (ref 38–126)
BILIRUBIN TOTAL: 0.6 mg/dL (ref 0.3–1.2)
BUN: 15 mg/dL (ref 6–20)
CALCIUM: 9.4 mg/dL (ref 8.9–10.3)
CO2: 24 mmol/L (ref 22–32)
Chloride: 108 mmol/L (ref 101–111)
Creatinine, Ser: 0.9 mg/dL (ref 0.44–1.00)
GFR calc non Af Amer: >60 mL/min (ref >60)
Glucose, Bld: 122 mg/dL — ABNORMAL HIGH (ref 65–99)
POTASSIUM: 3.6 mmol/L (ref 3.5–5.1)
SODIUM: 141 mmol/L (ref 135–145)
TOTAL PROTEIN: 7.7 g/dL (ref 6.5–8.1)

## 2016-12-23 LAB — ETHANOL: Alcohol, Ethyl (B): 8 mg/dL — ABNORMAL HIGH (ref <5)

## 2016-12-23 LAB — ACETAMINOPHEN LEVEL: Acetaminophen (Tylenol), Serum: <10 ug/mL — ABNORMAL LOW (ref 10–30)

## 2016-12-23 LAB — HCG, QUANTITATIVE, PREGNANCY: hCG, Beta Chain, Quant, S: 1 m[IU]/mL (ref <5)

## 2016-12-23 LAB — SALICYLATE LEVEL

## 2016-12-23 MED ORDER — METFORMIN HCL 500 MG PO TABS
500.0000 mg | ORAL_TABLET | Freq: Two times a day (BID) | ORAL | Status: DC
  Administered 2016-12-24 – 2016-12-25 (×3): 500 mg via ORAL
  Filled 2016-12-23 (×3): qty 1

## 2016-12-23 MED ORDER — IBUPROFEN 200 MG PO TABS: 200.0000 mg | ORAL_TABLET | Freq: Four times a day (QID) | ORAL | Status: DC | PRN

## 2016-12-23 MED ORDER — ROPINIROLE HCL 1 MG PO TABS
2.0000 mg | ORAL_TABLET | Freq: Every day | ORAL | Status: DC
  Administered 2016-12-23 – 2016-12-24 (×2): 2 mg via ORAL
  Filled 2016-12-23 (×3): qty 2

## 2016-12-23 NOTE — ED Notes (Addendum)
Spoke with Judeth CornfieldStephanie from [facility]: -Symptomatic and supportive care -dizziness -hypotension -treat hypotension with fluids -monitor patient until back to baseline -patient is on Pristiq-Stephanie states since patient's QRS is 147, the recommend treating with sodium bicarb bolus unless this is not a new finding Dr. Freida BusmanAllen made aware and there is no previous change from 01/2016 EKG

## 2016-12-23 NOTE — ED Triage Notes (Signed)
Pt states she took about 25 xanax this afternoon, she states she took them to numb the pain after a very long and hard counseling session

## 2016-12-23 NOTE — ED Provider Notes (Signed)
[facility]ENC[facility] DEPT Provider Note   CSN: [id number] Arrival date & time: 12/23/16  2023     History   Chief Complaint Chief Complaint  Patient presents with  . Drug Overdose    HPI Erika Woods is a 51 y.o. female.  51 year old female presents with intentional ingestion of 25 0.5 mg and asked tablets approximately 4 hours prior to arrival. She did this not as a suicide attempt but because she was upset after a difficult counseling session. Denies any prior history of suicide attempt. Denies any other coingestions. Denies responding to internal stimuli. She also admits to drinking some alcohol this evening. Did not have any emesis. Informed her family and patient brought here for further management.      Past Medical History:  Diagnosis Date  . Diabetes mellitus without complication ([name])   . Renal disorder    kideny stones    Patient Active Problem List   Diagnosis Date Noted  . PTSD (post-traumatic stress disorder) 08/18/2011  . DM (diabetes mellitus) ([name]) 08/18/2011  . Sleep apnea 08/18/2011  . Mood disorder ([name]) 08/18/2011    Past Surgical History:  Procedure Laterality Date  . arm surgery Bilateral Skin removal  . DILATION AND EVACUATION    . KIDNEY STONE SURGERY    . TONSILLECTOMY      OB History    No data available       Home Medications    Prior to Admission medications   Medication Sig Start Date End Date Taking? Authorizing Provider  ALPRAZolam Prudy Feeler(XANAX) 0.5 MG tablet Take 0.5 mg by mouth 2 (two) times daily as needed for anxiety.  06/26/15   [provider]  clonazePAM (KLONOPIN) 1 MG tablet Take 1 mg by mouth 2 (two) times daily as needed for anxiety.    [provider]  clotrimazole-betamethasone (LOTRISONE) cream Apply 1 application topically 2 (two) times daily as needed (Applies under breast for yeast infection.).    [provider]  diclofenac (VOLTAREN) 75 MG EC tablet Take one tablet twice a day for 10 days,  then take as needed.  Take with food Patient not taking: Reported on 02/01/2016 09/20/15   Erika Corkraper, Timothy R, DO  ibuprofen (ADVIL,MOTRIN) 200 MG tablet Take 200 mg by mouth every 6 (six) hours as needed for pain.    [provider]  MELATONIN PO Take 1 tablet by mouth daily.    [provider]  metFORMIN (GLUCOPHAGE) 500 MG tablet Take 500 mg by mouth 2 (two) times daily with a meal.  10/28/13   [provider]  PRISTIQ 100 MG 24 hr tablet Take 100 mg by mouth daily.  08/06/15   [provider]  rOPINIRole (REQUIP) 2 MG tablet Take 2 mg by mouth at bedtime.  08/06/15   [provider]    Family History History reviewed. No pertinent family history.  Social History Social History  Substance Use Topics  . Smoking status: Never Smoker  . Smokeless tobacco: Never Used  . Alcohol use Yes     Comment: special occassional - mixed drinks     Allergies   Sulfa antibiotics   Review of Systems Review of Systems  All other systems reviewed and are negative.    Physical Exam Updated Vital Signs BP 106/77 (BP Location: Left Arm)   Pulse 93   Temp 97.8 F (36.6 C) (Oral)   Resp 20   LMP 01/29/2013   SpO2 96%   Physical Exam  Constitutional: She is  oriented to person, place, and time. She appears well-developed and well-nourished.  Non-toxic appearance. No distress.  HENT:  Head: Normocephalic and atraumatic.  Eyes: Pupils are equal, round, and reactive to light. Conjunctivae, EOM and lids are normal.  Neck: Normal range of motion. Neck supple. No tracheal deviation present. No thyroid mass present.  Cardiovascular: Normal rate, regular rhythm and normal heart sounds.  Exam reveals no gallop.   No murmur heard. Pulmonary/Chest: Effort normal and breath sounds normal. No stridor. No respiratory distress. She has no decreased breath sounds. She has no wheezes. She has no rhonchi. She has no rales.  Abdominal: Soft. Normal appearance and bowel  sounds are normal. She exhibits no distension. There is no tenderness. There is no rebound and no CVA tenderness.  Musculoskeletal: Normal range of motion. She exhibits no edema or tenderness.  Neurological: She is alert and oriented to person, place, and time. She has normal strength. No cranial nerve deficit or sensory deficit. GCS eye subscore is 4. GCS verbal subscore is 5. GCS motor subscore is 6.  Skin: Skin is warm and dry. No abrasion and no rash noted.  Psychiatric: Her affect is blunt. Her speech is delayed. She is withdrawn. She is not actively hallucinating. She expresses impulsivity. She expresses no suicidal plans and no homicidal plans.  Nursing note and vitals reviewed.    ED Treatments / Results  Labs (all labs ordered are listed, but only abnormal results are displayed) Labs Reviewed  ETHANOL  RAPID URINE DRUG SCREEN, HOSP PERFORMED  SALICYLATE LEVEL  ACETAMINOPHEN LEVEL  CBC WITH DIFFERENTIAL/PLATELET  COMPREHENSIVE METABOLIC PANEL    EKG  EKG Interpretation None       Radiology No results found.  Procedures Procedures (including critical care time)  Medications Ordered in ED Medications - No data to display   Initial Impression / Assessment and Plan / ED Course  I have reviewed the triage vital signs and the nursing notes.  Pertinent labs & imaging results that were available during my care of the patient were reviewed by me and considered in my medical decision making (see chart for details).     Patient's EKG unchanged from prior. With respects to her QRS interval. Monitored here and is awake alert and oriented and had her airway. Medically cleared for psychiatric disposition.  Final Clinical Impressions(s) / ED Diagnoses   Final diagnoses:  None    New Prescriptions New Prescriptions   No medications on file     Lorre Nick, MD 12/23/16 2201

## 2016-12-24 ENCOUNTER — Encounter (HOSPITAL_COMMUNITY): Payer: Self-pay | Admitting: Emergency Medicine

## 2016-12-24 LAB — CBG MONITORING, ED
GLUCOSE-CAPILLARY: 98 mg/dL (ref 65–99)
GLUCOSE-CAPILLARY: 91 mg/dL (ref 65–99)
Glucose-Capillary: 104 mg/dL — ABNORMAL HIGH (ref 65–99)
Glucose-Capillary: 92 mg/dL (ref 65–99)

## 2016-12-24 NOTE — ED Notes (Signed)
Patient belongings are one purse, flip flops, phone and charger and clothing. Patient has been wanded by security. Patient belonging bag is in the cabinet by the nurses station across from room 17
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
## 2016-12-24 NOTE — ED Notes (Signed)
[facility] gait steady remains alert x3 denies pain VSS.

## 2016-12-24 NOTE — ED Notes (Signed)
Pt presents with SI.  Pt ingested 25 Xanax tabs as SI attempt.  Denies at present.  Denies HI, or AVH.  Denies feeling hopeless.  Pt tearful and sobbing at present.  Comfort measures given.  A&O x 3, no acute distress noted.  Monitoring for safety, Q 15 min checks in effect.  Safety check for contraband completed.

## 2016-12-24 NOTE — ED Notes (Addendum)
Pt reports she sleeps with CPap Machine.  Charge Nurse Shelby DubinJesse James notified.

## 2016-12-25 DIAGNOSIS — F419 Anxiety disorder, unspecified: Secondary | ICD-10-CM

## 2016-12-25 DIAGNOSIS — E119 Type 2 diabetes mellitus without complications: Secondary | ICD-10-CM

## 2016-12-25 DIAGNOSIS — F4310 Post-traumatic stress disorder, unspecified: Secondary | ICD-10-CM

## 2016-12-25 DIAGNOSIS — F332 Major depressive disorder, recurrent severe without psychotic features: Secondary | ICD-10-CM

## 2016-12-25 DIAGNOSIS — R45851 Suicidal ideations: Secondary | ICD-10-CM

## 2016-12-25 LAB — CBG MONITORING, ED: GLUCOSE-CAPILLARY: 92 mg/dL (ref 65–99)

## 2016-12-25 NOTE — Discharge Instructions (Signed)
For your behavioral health needs you are advised to continue treatment with Milagros Evenerupinder Kaur, MD:       Milagros Evenerupinder Kaur, MD      [address]      [place], [place] [address]      [phone]
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
## 2016-12-25 NOTE — BH Assessment (Signed)
[facility] Assessment Progress Note  Per Thedore MinsMojeed Akintayo, MD, this pt does not require psychiatric hospitalization at this time.  [facility] treatment with Milagros Evenerupinder Kaur, MD.  This has been included in pt's discharge instructions.  Pt's nurse, Diane, has been notified.  Doylene Canninghomas Laurita Peron, MA Triage Specialist [phone]

## 2016-12-25 NOTE — ED Notes (Signed)
Pt discharged home. Discharged instructions read to pt who verbalized understanding. All belongings returned to pt who signed for same. Denies SI/HI, is not delusional and not responding to internal stimuli. Escorted pt to the ED exit.    

## 2016-12-25 NOTE — Consult Note (Signed)
[facility] Consult   Reason for Consult:  Suicidal ideation Referring Physician:  EDP Patient Identification: Erika Woods MRN:  [id number] Principal Diagnosis: PTSD (post-traumatic stress disorder) Diagnosis:   Patient Active Problem List   Diagnosis Date Noted  . PTSD (post-traumatic stress disorder) [F43.10] 08/18/2011  . DM (diabetes mellitus) ([place]) [E11.9] 08/18/2011  . Sleep apnea [G47.30] 08/18/2011  . Mood disorder ([place]) [F39] 08/18/2011    Total Time spent with patient: 30 minutes  Subjective:   Erika Woods is a 51 y.o. female patient admitted with an suicidal ideation.  HPI: Pt was seen by Dr Darleene Cleaver and this clinician. Pt was calm and cooperative, alert & oriented x 4, and appropriate for the situation.  Pt denies suicidal/homicidal ideation, denies auditory/visual hallucinations and does not appear to be responding to internal stimuli. Pt was able to contract for safety. Pt stated she has talked with her therapist and is going to increase her sessions to twicw weekly and will follow up with Dr Toy Care for psychiatry. Pt is stable and psychiatrically cleared for discharge.   Past Psychiatric History: As above  Risk to Self: None Risk to Others: None Prior Inpatient Therapy: Prior Inpatient Therapy: No Prior Outpatient Therapy: Prior Outpatient Therapy: Yes Prior Therapy Dates:  (CURRENT FOR 5 1/2 YRS) Prior Therapy Facilty/Provider(s):  ([facility], [facility] [place]) Reason for Treatment:  (PTSD, MDD) Does patient have an ACCT team?: No Does patient have Intensive In-House Services?  : No Does patient have [facility] services? : No Does patient have P4CC services?: No  Past Medical History:  Past Medical History:  Diagnosis Date  . Diabetes mellitus without complication ([place])   . Obstructive sleep apnea on CPAP   . Renal disorder    kideny stones    Past Surgical History:  Procedure Laterality Date  . arm surgery Bilateral Skin removal  . DILATION AND  EVACUATION    . KIDNEY STONE SURGERY    . TONSILLECTOMY     Family History: History reviewed. No pertinent family history. Family Psychiatric  History: Unknown Social History:  History  Alcohol Use  . Yes    Comment: special occassional - mixed drinks     History  Drug Use No    Social History   Social History  . Marital status: Married    Spouse name: N/A  . Number of children: N/A  . Years of education: N/A   Social History Main Topics  . Smoking status: Never Smoker  . Smokeless tobacco: Never Used  . Alcohol use Yes     Comment: special occassional - mixed drinks  . Drug use: No  . Sexual activity: Not Asked   Other Topics Concern  . None   Social History Narrative  . None   Additional Social History:    Allergies:   Allergies  Allergen Reactions  . Sulfa Antibiotics Hives    Labs:  Results for orders placed or performed during the hospital encounter of 12/23/16 (from the past 48 hour(s))  Ethanol     Status: Abnormal   Collection Time: 12/23/16  8:56 PM  Result Value Ref Range   Alcohol, Ethyl (B) 8 (H) <5 mg/dL    Comment:        LOWEST DETECTABLE LIMIT FOR SERUM ALCOHOL IS 5 mg/dL FOR MEDICAL PURPOSES ONLY   Salicylate level     Status: None   Collection Time: 12/23/16  8:56 PM  Result Value Ref Range   Salicylate Lvl <0.9 2.8 -  30.0 mg/dL  Acetaminophen level     Status: Abnormal   Collection Time: 12/23/16  8:56 PM  Result Value Ref Range   Acetaminophen (Tylenol), Serum <10 (L) 10 - 30 ug/mL    Comment:        THERAPEUTIC CONCENTRATIONS VARY SIGNIFICANTLY. A RANGE OF 10-30 ug/mL MAY BE AN EFFECTIVE CONCENTRATION FOR MANY PATIENTS. HOWEVER, SOME ARE BEST TREATED AT CONCENTRATIONS OUTSIDE THIS RANGE. ACETAMINOPHEN CONCENTRATIONS >150 ug/mL AT 4 HOURS AFTER INGESTION AND >50 ug/mL AT 12 HOURS AFTER INGESTION ARE OFTEN ASSOCIATED WITH TOXIC REACTIONS.   CBC with Differential/Platelet     Status: None   Collection Time: 12/23/16   8:56 PM  Result Value Ref Range   WBC 8.9 4.0 - 10.5 K/uL   RBC 4.80 3.87 - 5.11 MIL/uL   Hemoglobin 13.9 12.0 - 15.0 g/dL   HCT 42.4 36.0 - 46.0 %   MCV 88.3 78.0 - 100.0 fL   MCH 29.0 26.0 - 34.0 pg   MCHC 32.8 30.0 - 36.0 g/dL   RDW 14.4 11.5 - 15.5 %   Platelets 263 150 - 400 K/uL   Neutrophils Relative % 64 %   Neutro Abs 5.6 1.7 - 7.7 K/uL   Lymphocytes Relative 27 %   Lymphs Abs 2.4 0.7 - 4.0 K/uL   Monocytes Relative 5 %   Monocytes Absolute 0.4 0.1 - 1.0 K/uL   Eosinophils Relative 5 %   Eosinophils Absolute 0.4 0.0 - 0.7 K/uL   Basophils Relative 1 %   Basophils Absolute 0.1 0.0 - 0.1 K/uL  Comprehensive metabolic panel     Status: Abnormal   Collection Time: 12/23/16  8:56 PM  Result Value Ref Range   Sodium 141 135 - 145 mmol/L   Potassium 3.6 3.5 - 5.1 mmol/L   Chloride 108 101 - 111 mmol/L   CO2 24 22 - 32 mmol/L   Glucose, Bld 122 (H) 65 - 99 mg/dL   BUN 15 6 - 20 mg/dL   Creatinine, Ser 0.90 0.44 - 1.00 mg/dL   Calcium 9.4 8.9 - 10.3 mg/dL   Total Protein 7.7 6.5 - 8.1 g/dL   Albumin 4.3 3.5 - 5.0 g/dL   AST 20 15 - 41 U/L   ALT 19 14 - 54 U/L   Alkaline Phosphatase 73 38 - 126 U/L   Total Bilirubin 0.6 0.3 - 1.2 mg/dL   GFR calc non Af Amer >60 >60 mL/min   GFR calc Af Amer >60 >60 mL/min    Comment: (NOTE) The eGFR has been calculated using the CKD EPI equation. This calculation has not been validated in all clinical situations. eGFR's persistently <60 mL/min signify possible Chronic Kidney Disease.    Anion gap 9 5 - 15  hCG, quantitative, pregnancy     Status: None   Collection Time: 12/23/16  9:06 PM  Result Value Ref Range   hCG, Beta Chain, Quant, S 1 <5 mIU/mL    Comment:          GEST. AGE      CONC.  (mIU/mL)   <=1 WEEK        5 - 50     2 WEEKS       50 - 500     3 WEEKS       100 - 10,000     4 WEEKS     1,000 - 30,000     5 WEEKS     3,500 - 115,000  6-8 WEEKS     12,000 - 270,000    12 WEEKS     15,000 - 220,000        FEMALE  AND NON-PREGNANT FEMALE:     LESS THAN 5 mIU/mL   Rapid urine drug screen (hospital performed)     Status: Abnormal   Collection Time: 12/23/16  9:12 PM  Result Value Ref Range   Opiates NONE DETECTED NONE DETECTED   Cocaine NONE DETECTED NONE DETECTED   Benzodiazepines POSITIVE (A) NONE DETECTED   Amphetamines NONE DETECTED NONE DETECTED   Tetrahydrocannabinol NONE DETECTED NONE DETECTED   Barbiturates NONE DETECTED NONE DETECTED    Comment:        DRUG SCREEN FOR MEDICAL PURPOSES ONLY.  IF CONFIRMATION IS NEEDED FOR ANY PURPOSE, NOTIFY LAB WITHIN 5 DAYS.        LOWEST DETECTABLE LIMITS FOR URINE DRUG SCREEN Drug Class       Cutoff (ng/mL) Amphetamine      1000 Barbiturate      200 Benzodiazepine   268 Tricyclics       341 Opiates          300 Cocaine          300 THC              50   CBG monitoring, ED     Status: None   Collection Time: 12/24/16  8:08 AM  Result Value Ref Range   Glucose-Capillary 98 65 - 99 mg/dL  CBG monitoring, ED     Status: Abnormal   Collection Time: 12/24/16 12:39 PM  Result Value Ref Range   Glucose-Capillary 104 (H) 65 - 99 mg/dL  CBG monitoring, ED     Status: None   Collection Time: 12/24/16  5:13 PM  Result Value Ref Range   Glucose-Capillary 92 65 - 99 mg/dL  CBG monitoring, ED     Status: None   Collection Time: 12/24/16  9:52 PM  Result Value Ref Range   Glucose-Capillary 91 65 - 99 mg/dL   Comment 1 Notify RN   CBG monitoring, ED     Status: None   Collection Time: 12/25/16  7:24 AM  Result Value Ref Range   Glucose-Capillary 92 65 - 99 mg/dL    Current Facility-Administered Medications  Medication Dose Route Frequency Provider Last Rate Last Dose  . ibuprofen (ADVIL,MOTRIN) tablet 200 mg  200 mg Oral Q6H PRN Lacretia Leigh, MD      . metFORMIN (GLUCOPHAGE) tablet 500 mg  500 mg Oral BID WC Lacretia Leigh, MD   500 mg at 12/25/16 0756  . rOPINIRole (REQUIP) tablet 2 mg  2 mg Oral QHS Lacretia Leigh, MD   2 mg at 12/24/16  2121   Current Outpatient Prescriptions  Medication Sig Dispense Refill  . ALPRAZolam (XANAX) 0.5 MG tablet Take 0.5 mg by mouth 2 (two) times daily as needed for anxiety.     . clonazePAM (KLONOPIN) 1 MG tablet Take 1 mg by mouth 2 (two) times daily as needed for anxiety.    Marland Kitchen ibuprofen (ADVIL,MOTRIN) 200 MG tablet Take 200 mg by mouth every 6 (six) hours as needed for pain.    . metFORMIN (GLUCOPHAGE) 500 MG tablet Take 500 mg by mouth 2 (two) times daily with a meal.     . PRISTIQ 100 MG 24 hr tablet Take 100 mg by mouth daily.     Marland Kitchen rOPINIRole (REQUIP) 2 MG tablet Take 2 mg by mouth  at bedtime.     . diclofenac (VOLTAREN) 75 MG EC tablet Take one tablet twice a day for 10 days, then take as needed.  Take with food (Patient not taking: Reported on 02/01/2016) 40 tablet 0    Musculoskeletal: Strength & Muscle Tone: within normal limits Gait & Station: normal Patient leans: N/A  Psychiatric Specialty Exam: Physical Exam  Constitutional: She appears well-developed.  Respiratory: Effort normal.  Musculoskeletal: Normal range of motion.  Neurological: She is alert.    Review of Systems  Psychiatric/Behavioral: Positive for depression and suicidal ideas. Negative for hallucinations, memory loss and substance abuse. The patient is nervous/anxious. The patient does not have insomnia.   All other systems reviewed and are negative.   Blood pressure 102/65, pulse 68, temperature 98.5 F (36.9 C), temperature source Oral, resp. rate 17, last menstrual period 01/29/2013, SpO2 97 %.There is no height or weight on file to calculate BMI.  General Appearance: Casual  Eye Contact:  Good  Speech:  Clear and Coherent and Normal Rate  Volume:  Normal  Mood:  Depressed and Dysphoric  Affect:  Congruent, Depressed and Flat  Thought Process:  Coherent, Goal Directed and Linear  Orientation:  Full (Time, Place, and Person)  Thought Content:  Logical  Suicidal Thoughts:  No  Homicidal Thoughts:  No   Memory:  Immediate;   Good Recent;   Good Remote;   Fair  Judgement:  Fair  Insight:  Fair  Psychomotor Activity:  Normal  Concentration:  Concentration: Good and Attention Span: Good  Recall:  Good  Fund of Knowledge:  Good  Language:  Good  Akathisia:  No  Handed:  Right  AIMS (if indicated):     Assets:  Agricultural consultant Housing Physical Health Resilience Social Support Transportation Vocational/Educational  ADL's:  Intact  Cognition:  WNL  Sleep:        Treatment Plan Summary: Plan PTSD  Discharge Home Follow up with Franki Cabot for therapy and Dr Toy Care for psychiatry and medication management.  Avoid the use of alcohol and illicit drugs Take all medications only as prescribed.  Disposition: No evidence of imminent risk to self or others at present.   Patient does not meet criteria for psychiatric inpatient admission. Supportive therapy provided about ongoing stressors. Discussed crisis plan, support from social network, calling 911, coming to the Emergency Department, and calling Suicide Hotline.  Ethelene Hal, NP 12/25/2016 10:48 AM  Patient seen face-to-face for psychiatric evaluation, chart reviewed and case discussed with the physician extender and developed treatment plan. Reviewed the information documented and agree with the treatment plan. Corena Pilgrim, MD

## 2016-12-25 NOTE — ED Notes (Signed)
Introduced self to patient. Pt oriented to unit expectations.  Assessed pt for:  A) Anxiety &/or agitation: Pt reports difficulty sleeping last night because she did not have her C-PAP. Therefore she is tired today. She denies SI/HI/AVH and wants to go home. Her therapist and husband are supportive, according to pt, and her husband is going to be included in her therapy so that he can recognize her depressive symptoms. She continues to have a blunted affect.   S) Safety: Safety maintained with q-15-minute checks and hourly rounds by staff.  A) ADLs: Pt able to perform ADLs independently.  P) Pick-Up (room cleanliness): Pt's room clean and free of clutter.

## 2017-02-23 ENCOUNTER — Telehealth: Payer: Self-pay | Admitting: Family Medicine

## 2017-02-23 ENCOUNTER — Encounter: Payer: Self-pay | Admitting: Sports Medicine

## 2017-02-23 ENCOUNTER — Ambulatory Visit (INDEPENDENT_AMBULATORY_CARE_PROVIDER_SITE_OTHER): Payer: BLUE CROSS/BLUE SHIELD | Admitting: Sports Medicine

## 2017-02-23 VITALS — BP 110/77 | Ht 65.0 in | Wt 280.0 lb

## 2017-02-23 DIAGNOSIS — M7989 Other specified soft tissue disorders: Secondary | ICD-10-CM | POA: Diagnosis not present

## 2017-02-23 DIAGNOSIS — M25562 Pain in left knee: Secondary | ICD-10-CM | POA: Diagnosis not present

## 2017-02-23 DIAGNOSIS — M25561 Pain in right knee: Secondary | ICD-10-CM | POA: Diagnosis not present

## 2017-02-23 DIAGNOSIS — R7989 Other specified abnormal findings of blood chemistry: Secondary | ICD-10-CM

## 2017-02-23 MED ORDER — DICLOFENAC POTASSIUM 50 MG PO TABS: 50.0000 mg | ORAL_TABLET | Freq: Every day | ORAL | 0 refills | Status: DC

## 2017-02-23 NOTE — Progress Notes (Signed)
   Subjective:    Patient ID: Erika Woods, female    DOB: [birth year], 51 y.o.   MRN: [id number]  HPI [name] presents today 8 days after a fall where she missed the bottom step at a vacation home. She landed on her L knee and twisted her right knee. She was seen in urgent care a few days later. She was told she had a bone bruise on both patellas, and she feels she has made minimal improvement in pain during the last week, despite PRN ibuprofen, icing, crutches, and ace wraps. No hx of fracture or surgery to either knee. She also notes some swelling and pain over her medial lower leg, but not over bruise. She is concerned she could have a blood clot there. This pain began 3 days after her initial injury. R knee is her primary concern today, and she feels it catches at about 80 degrees of flexion.   Review of Systems     Objective:   Physical Exam  L knee: bruising over anterior knee, no effusion appreciated. Ligamentous structures intact. Medial joint line tenderness. Normal ROM, no crepitus.  R knee: pain with active flexion, no effusion on exam. Ligamentous structures intact. Medial joint line tenderness. Normal ROM, no crepitus. Posterior knee tenderness.  L lower leg: bruising over anterior distal leg, exquisitely TTP over medial ankle without overlying bruising. Negative Homan's sign, no cords palpated.    Bedside US: R knee without effusion.      Assessment & Plan:  L leg pain and swelling: no palpable cords and negative Homan's sign. However, given immobility, obesity, and onset 3 days after fall, will get STAT d dimer. Will order DVT US as needed given D dimer results.   Bilateral knee pain: likely secondary to acute injury. Exam makes ligamentous tears less likely, although somewhat limited due to body habitus. If no improvement in 1 week, consider advanced imaging. Given diclofenac  (this worked well for her in the past) for 7d then PRN.   Orders Placed This Encounter    Procedures  . D-dimer, quantitative (not at [facility])    Call Dr Loni Muse with results at [phone]   Loni Muse, MD   Patient seen and evaluated with the resident. I agree with the above plan of care. Patient's x-rays are unremarkable. I discussed further diagnostic imaging versus watchful waiting. We are going to wait a couple of weeks to see if her symptoms will improve. Since she does have some left lower extremity swelling and tenderness to palpation which is relatively new along the distal medial calf and ankle we will proceed with a Doppler to rule out a left lower extremity DVT (d-dimer was elevated at 1.25). Patient will follow-up with me in 2 weeks but I will call her with the results of her Doppler when available.

## 2017-02-23 NOTE — Telephone Encounter (Signed)
Called patient to discuss D dimer result from [facility] - 1.25, ref range 0.00-0.49. Explained that this can be falsely elevated, but we will need to get an [place] of her leg to rule out a clot. Ordered DVT US Left leg STAT. D/w Dr. Margaretha Sheffield, will also route to team staff. Let patient know that the team would call her in the am to discuss where to go to get this done (ordered at [facility], but I am not sure if she needs an appt time like MRIs). Explained that this is not emergent, and patient understood.   Loni Muse, MD

## 2017-02-24 ENCOUNTER — Telehealth: Payer: Self-pay | Admitting: Family Medicine

## 2017-02-24 ENCOUNTER — Ambulatory Visit (HOSPITAL_COMMUNITY)
Admission: RE | Admit: 2017-02-24 | Discharge: 2017-02-24 | Disposition: A | Discharge status: Home / Self Care | Payer: BLUE CROSS/BLUE SHIELD | Source: Ambulatory Visit | Attending: Sports Medicine | Admitting: Sports Medicine

## 2017-02-24 DIAGNOSIS — R7989 Other specified abnormal findings of blood chemistry: Secondary | ICD-10-CM | POA: Insufficient documentation

## 2017-02-24 DIAGNOSIS — M25572 Pain in left ankle and joints of left foot: Secondary | ICD-10-CM | POA: Insufficient documentation

## 2017-02-24 LAB — D-DIMER, QUANTITATIVE: D-DIMER: 1.25 mg/L FEU — ABNORMAL HIGH (ref 0.00–0.49)

## 2017-02-24 NOTE — Progress Notes (Signed)
*  PRELIMINARY RESULTS* Vascular Ultrasound Left lower extremity venous duplex has been completed.  Preliminary findings: No evidence of deep vein thrombosis in the visualized veins of the left lower extremity.  Somewhat limited edam due to patient body habitus and penetration.  Nothing seen medial left ankle area of pain.  Preliminary results called to Dr. Margaretha Sheffield @ 15:35   [name] 02/24/2017, 3:34 PM

## 2017-02-24 NOTE — Telephone Encounter (Signed)
Patient would like to speak to you about ultrasound she had done today, thanks.

## 2017-02-25 ENCOUNTER — Telehealth: Payer: Self-pay | Admitting: Sports Medicine

## 2017-02-25 NOTE — Telephone Encounter (Signed)
I spoke with Erika Woods yesterday and informed her that the Doppler of her left lower extremity was negative for DVT. I also clarified a mistake that was made during her prescribing of Voltaren. The written instructions were to take the medication once a day but it should be twice daily. She understands. She will keep her follow-up appointment with me in 2 weeks but will call in the interim with questions or concerns.

## 2017-03-09 ENCOUNTER — Ambulatory Visit: Payer: BLUE CROSS/BLUE SHIELD | Admitting: Sports Medicine

## 2017-06-08 ENCOUNTER — Encounter: Payer: Self-pay | Admitting: Sports Medicine

## 2017-06-08 ENCOUNTER — Ambulatory Visit (INDEPENDENT_AMBULATORY_CARE_PROVIDER_SITE_OTHER): Payer: BLUE CROSS/BLUE SHIELD | Admitting: Sports Medicine

## 2017-06-08 VITALS — BP 124/80 | Ht 65.0 in | Wt 280.0 lb

## 2017-06-08 DIAGNOSIS — M2390 Unspecified internal derangement of unspecified knee: Secondary | ICD-10-CM | POA: Diagnosis not present

## 2017-06-08 NOTE — Progress Notes (Signed)
   Subjective:    Patient ID: Erika Woods, female    DOB: Feb 04, 1966, 52 y.o.   MRN: [id number]  HPI [name] is a 52 year old female who presents for 10 days of right knee locking and pain. She states that she was walking at a park when she bent down to tie her shoe, and when she stood back up she was unable to fully extend her knee. She denies any twisting or popping of her knee. For the past 10 days she has not been able to fully extend her knee, and the back of her knee becomes painful with exertion. She denies any swelling or erythema of the joint. She states that she has had several episodes of these symptoms over the past 2 years, however they typically resolve within a day. She denies any history of DVT, baker cyst, significant knee injury or surgery.    Review of Systems Negative except stated as above    Objective:   Physical Exam General: Well appearing, obese, no acute distress Cardiovascular: pedal and femoral pulses 2+. No palpable cords Right knee: No swelling or joint effusion. No erythema. Patient lacks full extension. No joint line tenderness. Mild point tenderness in popliteal fossa. Negative Thessaly. Negative anterior and posterior drawer  Portable US performed: No evidence of baker's cyst. No effusion or soft tissue swelling.    Assessment & Plan:  52 year old female who presents for 10 days of right knee locking and pain. Unclear etiology based on exam. Suspect floating body vs flipped meniscal tear vs arthritis. Will order MRI for further evaluation. Call patient with results when available and delineate more definitive treatment based on those findings.

## 2017-06-14 ENCOUNTER — Ambulatory Visit
Admission: RE | Admit: 2017-06-14 | Discharge: 2017-06-14 | Disposition: A | Discharge status: Home / Self Care | Payer: BLUE CROSS/BLUE SHIELD | Source: Ambulatory Visit | Attending: Sports Medicine | Admitting: Sports Medicine

## 2017-06-14 DIAGNOSIS — M2390 Unspecified internal derangement of unspecified knee: Secondary | ICD-10-CM

## 2017-06-15 ENCOUNTER — Telehealth: Payer: Self-pay | Admitting: Sports Medicine

## 2017-06-15 NOTE — Telephone Encounter (Signed)
  I spoke with the patient on the phone today regarding the MRI of her right knee. MRI does confirm a 10 mm loose body in the posterior aspect of the lateral compartment. She also has extensive full-thickness cartilage loss in the lateral aspect of the patellofemoral compartment.  Although she does have degenerative changes in her knee, her symptoms are very mechanical which makes me suspicious that the loose body is the main reason for her pain and inability to completely extend the knee. Therefore, I'm going to refer the patient to Dr. Jerl Santosalldorf to discuss treatment options. I will defer further workup and treatment to his discretion. Follow-up with me as needed.

## 2017-06-15 NOTE — Telephone Encounter (Signed)
xxx

## 2017-06-16 ENCOUNTER — Other Ambulatory Visit: Payer: Self-pay

## 2017-06-16 DIAGNOSIS — M2391 Unspecified internal derangement of right knee: Secondary | ICD-10-CM

## 2017-09-15 ENCOUNTER — Ambulatory Visit
Admission: RE | Admit: 2017-09-15 | Discharge: 2017-09-15 | Disposition: A | Discharge status: Home / Self Care | Payer: BLUE CROSS/BLUE SHIELD | Source: Ambulatory Visit | Attending: Family Medicine | Admitting: Family Medicine

## 2017-09-15 ENCOUNTER — Ambulatory Visit (HOSPITAL_BASED_OUTPATIENT_CLINIC_OR_DEPARTMENT_OTHER)
Admission: RE | Admit: 2017-09-15 | Discharge: 2017-09-15 | Disposition: A | Discharge status: Home / Self Care | Payer: BLUE CROSS/BLUE SHIELD | Source: Ambulatory Visit | Attending: Internal Medicine | Admitting: Internal Medicine

## 2017-09-15 ENCOUNTER — Other Ambulatory Visit (HOSPITAL_COMMUNITY): Payer: Self-pay | Admitting: Family Medicine

## 2017-09-15 ENCOUNTER — Other Ambulatory Visit: Payer: Self-pay | Admitting: Family Medicine

## 2017-09-15 ENCOUNTER — Other Ambulatory Visit: Payer: Self-pay

## 2017-09-15 ENCOUNTER — Inpatient Hospital Stay (HOSPITAL_COMMUNITY)
Admission: EM | Admit: 2017-09-15 | Discharge: 2017-09-17 | DRG: 176 | Disposition: A | Discharge status: Home / Self Care | Payer: BLUE CROSS/BLUE SHIELD | Attending: Internal Medicine | Admitting: Internal Medicine

## 2017-09-15 DIAGNOSIS — R7989 Other specified abnormal findings of blood chemistry: Secondary | ICD-10-CM

## 2017-09-15 DIAGNOSIS — I712 Thoracic aortic aneurysm, without rupture, unspecified: Secondary | ICD-10-CM | POA: Diagnosis present

## 2017-09-15 DIAGNOSIS — I2699 Other pulmonary embolism without acute cor pulmonale: Secondary | ICD-10-CM

## 2017-09-15 DIAGNOSIS — I471 Supraventricular tachycardia: Secondary | ICD-10-CM | POA: Diagnosis present

## 2017-09-15 DIAGNOSIS — E118 Type 2 diabetes mellitus with unspecified complications: Secondary | ICD-10-CM

## 2017-09-15 DIAGNOSIS — I2609 Other pulmonary embolism with acute cor pulmonale: Secondary | ICD-10-CM | POA: Diagnosis not present

## 2017-09-15 DIAGNOSIS — G4733 Obstructive sleep apnea (adult) (pediatric): Secondary | ICD-10-CM | POA: Diagnosis present

## 2017-09-15 DIAGNOSIS — Z7984 Long term (current) use of oral hypoglycemic drugs: Secondary | ICD-10-CM

## 2017-09-15 DIAGNOSIS — Z79899 Other long term (current) drug therapy: Secondary | ICD-10-CM

## 2017-09-15 DIAGNOSIS — I82812 Embolism and thrombosis of superficial veins of left lower extremities: Secondary | ICD-10-CM | POA: Diagnosis not present

## 2017-09-15 DIAGNOSIS — R0789 Other chest pain: Secondary | ICD-10-CM

## 2017-09-15 DIAGNOSIS — G4730 Sleep apnea, unspecified: Secondary | ICD-10-CM | POA: Diagnosis not present

## 2017-09-15 DIAGNOSIS — R0602 Shortness of breath: Secondary | ICD-10-CM | POA: Diagnosis not present

## 2017-09-15 DIAGNOSIS — F39 Unspecified mood [affective] disorder: Secondary | ICD-10-CM | POA: Diagnosis not present

## 2017-09-15 DIAGNOSIS — E119 Type 2 diabetes mellitus without complications: Secondary | ICD-10-CM

## 2017-09-15 DIAGNOSIS — R0682 Tachypnea, not elsewhere classified: Secondary | ICD-10-CM | POA: Diagnosis present

## 2017-09-15 DIAGNOSIS — Z6841 Body Mass Index (BMI) 40.0 and over, adult: Secondary | ICD-10-CM

## 2017-09-15 DIAGNOSIS — Z882 Allergy status to sulfonamides status: Secondary | ICD-10-CM

## 2017-09-15 DIAGNOSIS — Z87442 Personal history of urinary calculi: Secondary | ICD-10-CM

## 2017-09-15 DIAGNOSIS — F419 Anxiety disorder, unspecified: Secondary | ICD-10-CM | POA: Diagnosis present

## 2017-09-15 HISTORY — DX: Other pulmonary embolism without acute cor pulmonale: I26.99

## 2017-09-15 LAB — TROPONIN I: Troponin I: <0.03 ng/mL (ref <0.03)

## 2017-09-15 LAB — CBC WITH DIFFERENTIAL/PLATELET
Basophils Absolute: 0 10*3/uL (ref 0.0–0.1)
Basophils Relative: 1 %
Eosinophils Absolute: 0.4 10*3/uL (ref 0.0–0.7)
Eosinophils Relative: 5 %
HEMATOCRIT: 39.4 % (ref 36.0–46.0)
HEMOGLOBIN: 12.4 g/dL (ref 12.0–15.0)
LYMPHS ABS: 2 10*3/uL (ref 0.7–4.0)
Lymphocytes Relative: 26 %
MCH: 28.8 pg (ref 26.0–34.0)
MCHC: 31.5 g/dL (ref 30.0–36.0)
MCV: 91.6 fL (ref 78.0–100.0)
MONO ABS: 0.4 10*3/uL (ref 0.1–1.0)
MONOS PCT: 5 %
NEUTROS ABS: 5 10*3/uL (ref 1.7–7.7)
NEUTROS PCT: 63 %
Platelets: 247 10*3/uL (ref 150–400)
RBC: 4.3 MIL/uL (ref 3.87–5.11)
RDW: 14.3 % (ref 11.5–15.5)
WBC: 7.8 10*3/uL (ref 4.0–10.5)

## 2017-09-15 LAB — BASIC METABOLIC PANEL
Anion gap: 10 (ref 5–15)
BUN: 16 mg/dL (ref 6–20)
CHLORIDE: 108 mmol/L (ref 101–111)
CO2: 25 mmol/L (ref 22–32)
CREATININE: 0.93 mg/dL (ref 0.44–1.00)
Calcium: 9.2 mg/dL (ref 8.9–10.3)
GFR calc Af Amer: >60 mL/min (ref >60)
GFR calc non Af Amer: >60 mL/min (ref >60)
GLUCOSE: 113 mg/dL — AB (ref 65–99)
POTASSIUM: 4 mmol/L (ref 3.5–5.1)
SODIUM: 143 mmol/L (ref 135–145)

## 2017-09-15 LAB — BRAIN NATRIURETIC PEPTIDE: B NATRIURETIC PEPTIDE 5: 34.2 pg/mL (ref 0.0–100.0)

## 2017-09-15 MED ORDER — INSULIN ASPART 100 UNIT/ML ~~LOC~~ SOLN
0.0000 [IU] | Freq: Three times a day (TID) | SUBCUTANEOUS | Status: DC
  Administered 2017-09-16: 1 [IU] via SUBCUTANEOUS

## 2017-09-15 MED ORDER — ALPRAZOLAM 0.5 MG PO TABS
0.5000 mg | ORAL_TABLET | Freq: Two times a day (BID) | ORAL | Status: DC | PRN
  Administered 2017-09-16 (×2): 0.5 mg via ORAL
  Filled 2017-09-15 (×2): qty 1

## 2017-09-15 MED ORDER — HEPARIN (PORCINE) IN NACL 100-0.45 UNIT/ML-% IJ SOLN
1650.0000 [IU]/h | INTRAMUSCULAR | Status: DC
  Administered 2017-09-15: 1650 [IU]/h via INTRAVENOUS
  Filled 2017-09-15: qty 250

## 2017-09-15 MED ORDER — ACETAMINOPHEN 650 MG RE SUPP: 650.0000 mg | Freq: Four times a day (QID) | RECTAL | Status: DC | PRN

## 2017-09-15 MED ORDER — ROPINIROLE HCL ER 4 MG PO TB24
4.0000 mg | ORAL_TABLET | Freq: Every day | ORAL | Status: DC
  Administered 2017-09-15 – 2017-09-16 (×2): 4 mg via ORAL

## 2017-09-15 MED ORDER — IOPAMIDOL (ISOVUE-370) INJECTION 76%
75.0000 mL | Freq: Once | INTRAVENOUS | Status: AC | PRN
  Administered 2017-09-15: 75 mL via INTRAVENOUS

## 2017-09-15 MED ORDER — HEPARIN BOLUS VIA INFUSION
3000.0000 [IU] | Freq: Once | INTRAVENOUS | Status: AC
  Administered 2017-09-15: 3000 [IU] via INTRAVENOUS
  Filled 2017-09-15: qty 3000

## 2017-09-15 MED ORDER — FLUTICASONE-UMECLIDIN-VILANT 100-62.5-25 MCG/INH IN AEPB: 1.0000 | INHALATION_SPRAY | Freq: Every day | RESPIRATORY_TRACT | Status: DC | PRN

## 2017-09-15 MED ORDER — ACETAMINOPHEN 325 MG PO TABS
650.0000 mg | ORAL_TABLET | Freq: Four times a day (QID) | ORAL | Status: DC | PRN
  Administered 2017-09-17: 650 mg via ORAL
  Filled 2017-09-15: qty 2

## 2017-09-15 MED ORDER — ALBUTEROL SULFATE (2.5 MG/3ML) 0.083% IN NEBU: 2.5000 mg | INHALATION_SOLUTION | Freq: Four times a day (QID) | RESPIRATORY_TRACT | Status: DC | PRN

## 2017-09-15 MED ORDER — ONDANSETRON HCL 4 MG/2ML IJ SOLN: 4.0000 mg | Freq: Four times a day (QID) | INTRAMUSCULAR | Status: DC | PRN

## 2017-09-15 MED ORDER — MELATONIN 5 MG PO TABS
10.0000 mg | ORAL_TABLET | Freq: Every day | ORAL | Status: DC
  Administered 2017-09-15 – 2017-09-16 (×2): 10 mg via ORAL
  Filled 2017-09-15 (×2): qty 2

## 2017-09-15 MED ORDER — ONDANSETRON HCL 4 MG PO TABS: 4.0000 mg | ORAL_TABLET | Freq: Four times a day (QID) | ORAL | Status: DC | PRN

## 2017-09-15 NOTE — ED Notes (Signed)
2nd nurse verification heparin jen rn charge

## 2017-09-15 NOTE — ED Provider Notes (Signed)
[facility]-EMERGENCY DEPT Provider Note   CSN: [id number] Arrival date & time: 09/15/17  1927     History   Chief Complaint Chief Complaint  Patient presents with  . Pulmonary Infiltrate    possiable pe sent from pcp     HPI Erika Woods is a 52 y.o. female.  HPI  52 year old female with a history of diabetes and obesity presents at the recommendation of her PCP after being diagnosed with a pulmonary embolism on outpatient CT scan.  A couple months ago she had a right knee surgery.  She has had some dyspnea when going up the stairs but over the last 24 hours has been feeling more dyspneic and even dyspneic at rest.  Worsens with any minimal activity.  She had some on and off chest pressure that she is not sure if his anxiety related.  She has not noticed any specific leg pain or swelling but when she had an ultrasound performed she states there was pain in her left distal thigh.  This is where an SVT was seen.  She had elevated d-dimer and a CT scan was performed and she was called and told to come to the ER due to a pulmonary embolism.  She denies any history of known bleeding disorder or having bleeding from anywhere such as her gums or rectum but states that she does bruise easily.  Past Medical History:  Diagnosis Date  . Diabetes mellitus without complication ([name])   . Obstructive sleep apnea on CPAP   . Renal disorder    kideny stones    Patient Active Problem List   Diagnosis Date Noted  . Pulmonary emboli ([name]) 09/15/2017  . PTSD (post-traumatic stress disorder) 08/18/2011  . DM (diabetes mellitus) ([name]) 08/18/2011  . Sleep apnea 08/18/2011  . Mood disorder ([name]) 08/18/2011    Past Surgical History:  Procedure Laterality Date  . arm surgery Bilateral Skin removal  . DILATION AND EVACUATION    . KIDNEY STONE SURGERY    . TONSILLECTOMY       OB History   None      Home Medications    Prior to Admission medications   Medication Sig  Start Date End Date Taking? Authorizing Provider  ALPRAZolam Prudy Feeler) 0.5 MG tablet Take 0.5 mg by mouth 2 (two) times daily as needed for anxiety.  06/26/15  Yes [provider]  clonazePAM (KLONOPIN) 1 MG tablet Take 1 mg by mouth 2 (two) times daily as needed for anxiety.   Yes [provider]  Fluticasone-Umeclidin-Vilant (TRELEGY ELLIPTA) 100-62.5-25 MCG/INH AEPB Inhale 1 puff into the lungs daily as needed (wheezing.).   Yes [provider]  ibuprofen (ADVIL,MOTRIN) 200 MG tablet Take 200 mg by mouth every 6 (six) hours as needed for pain.   Yes [provider]  Melatonin 5 MG TABS Take 10 mg by mouth at bedtime.   Yes [provider]  metFORMIN (GLUCOPHAGE) 500 MG tablet Take 500 mg by mouth 2 (two) times daily with a meal.  10/28/13  Yes [provider]  PRISTIQ 100 MG 24 hr tablet Take 100 mg by mouth daily.  08/06/15  Yes [provider]  rOPINIRole (REQUIP XL) 4 MG 24 hr tablet Take 4 mg by mouth daily. 08/18/17  Yes [provider]  diclofenac (CATAFLAM) 50 MG tablet Take 1 tablet (50 mg total) by mouth daily. Take for 7 days in a row, then as needed. Patient not taking: Reported on 09/15/2017  02/23/17   Garth Bigness, MD  diclofenac (VOLTAREN) 75 MG EC tablet Take one tablet twice a day for 10 days, then take as needed.  Take with food Patient not taking: Reported on 09/15/2017 09/20/15   Ralene Cork, DO    Family History No family history on file.  Social History Social History   Tobacco Use  . Smoking status: Never Smoker  . Smokeless tobacco: Never Used  Substance Use Topics  . Alcohol use: Yes    Comment: special occassional - mixed drinks  . Drug use: No     Allergies   Sulfa antibiotics   Review of Systems Review of Systems  Respiratory: Positive for shortness of breath.   Cardiovascular: Positive for chest pain. Negative for leg swelling.  Musculoskeletal: Positive for myalgias.  All  other systems reviewed and are negative.    Physical Exam Updated Vital Signs BP 124/73 (BP Location: Left Arm)   Pulse 63   Temp 97.6 F (36.4 C) (Oral)   Resp (!) 24   Ht  (1.651 m)   Wt 131.5 kg (290 lb)   LMP 01/29/2013   SpO2 97%   BMI 48.26 kg/m   Physical Exam  Constitutional: She is oriented to person, place, and time. She appears well-developed and well-nourished. No distress.  obese  HENT:  Head: Normocephalic and atraumatic.  Right Ear: External ear normal.  Left Ear: External ear normal.  Nose: Nose normal.  Eyes: Right eye exhibits no discharge. Left eye exhibits no discharge.  Cardiovascular: Normal rate, regular rhythm and normal heart sounds.  Pulmonary/Chest: Effort normal and breath sounds normal.  Abdominal: Soft. There is no tenderness.  Musculoskeletal:       Left upper leg: She exhibits tenderness.       Legs: Neurological: She is alert and oriented to person, place, and time.  Skin: Skin is warm and dry. She is not diaphoretic.  Nursing note and vitals reviewed.    ED Treatments / Results  Labs (all labs ordered are listed, but only abnormal results are displayed) Labs Reviewed  BASIC METABOLIC PANEL - Abnormal; Notable for the following components:      Result Value   Glucose, Bld 113 (*)    All other components within normal limits  BRAIN NATRIURETIC PEPTIDE  TROPONIN I  CBC WITH DIFFERENTIAL/PLATELET  HEPARIN LEVEL (UNFRACTIONATED)  CBC  HIV ANTIBODY (ROUTINE TESTING)    EKG EKG Interpretation  Date/Time:  Tuesday Sep 15 2017 20:23:18 EDT Ventricular Rate:  77 PR Interval:    QRS Duration: 135 QT Interval:  399 QTC Calculation: 452 R Axis:   -52 Text Interpretation:  Normal sinus rhythm RBBB and LAFB no significant change since Aug 2018 Confirmed by Pricilla Loveless [phone]) on 09/15/2017 8:33:16 PM   Radiology Ct Angio Chest Pe W Or Wo Contrast  Addendum Date: 09/15/2017   ADDENDUM REPORT: 09/15/2017 17:58 ADDENDUM:  Critical Value/emergent results were called by telephone at the time of interpretation on 09/15/2017 at 5:57 pm to Dr. Nadyne Coombes , who verbally acknowledged these results. Electronically Signed   By: Lupita Raider, M.D.   On: 09/15/2017 17:58   Result Date: 09/15/2017 CLINICAL DATA:  Shortness of breath, elevated D-dimer level. EXAM: CT ANGIOGRAPHY CHEST WITH CONTRAST TECHNIQUE: Multidetector CT imaging of the chest was performed using the standard protocol during bolus administration of intravenous contrast. Multiplanar CT image reconstructions and MIPs were obtained to evaluate the vascular anatomy. CONTRAST:  75mL ISOVUE-370 IOPAMIDOL (ISOVUE-370) INJECTION  76% COMPARISON:  Radiographs of March 19, 2009. FINDINGS: Cardiovascular: Linear filling defect is seen in right main pulmonary artery and lower lobe branches consistent with acute pulmonary embolus. RV/LV ratio of 1 is noted suggesting right heart strain. 4.0 cm ascending thoracic aortic aneurysm is noted. No dissection is noted. Mediastinum/Nodes: No enlarged mediastinal, hilar, or axillary lymph nodes. Thyroid gland, trachea, and esophagus demonstrate no significant findings. Lungs/Pleura: Lungs are clear. No pleural effusion or pneumothorax. Upper Abdomen: Fatty infiltration of the liver is noted. No other abnormality seen in visualized portion of upper abdomen. Musculoskeletal: No chest wall abnormality. No acute or significant osseous findings. Review of the MIP images confirms the above findings. IMPRESSION: Acute pulmonary embolus is noted in right main pulmonary artery and lower lobe branches. Positive for acute PE with CT evidence of right heart strain (RV/LV Ratio = 1.0) consistent with at least submassive (intermediate risk) PE. The presence of right heart strain has been associated with an increased risk of morbidity and mortality. Please activate Code PE by paging [phone]. 4.0 cm ascending thoracic aortic aneurysm is noted. Recommend  annual imaging followup by CTA or MRA. This recommendation follows 2010 ACCF/AHA/AATS/ACR/ASA/SCA/SCAI/SIR/STS/SVM Guidelines for the Diagnosis and Management of Patients with Thoracic Aortic Disease. Circulation. 2010; 121: B284-X324. Fatty infiltration of the liver. Electronically Signed: By: Lupita Raider, M.D. On: 09/15/2017 17:44    Procedures .Critical Care Performed by: Pricilla Loveless, MD Authorized by: Pricilla Loveless, MD   Critical care provider statement:    Critical care time (minutes):  30   Critical care time was exclusive of:  Separately billable procedures and treating other patients   Critical care was necessary to treat or prevent imminent or life-threatening deterioration of the following conditions:  Cardiac failure and respiratory failure   Critical care was time spent personally by me on the following activities:  Development of treatment plan with patient or surrogate, discussions with consultants, evaluation of patient's response to treatment, examination of patient, obtaining history from patient or surrogate, ordering and performing treatments and interventions, ordering and review of radiographic studies, ordering and review of laboratory studies, pulse oximetry, re-evaluation of patient's condition and review of old charts   (including critical care time)  Medications Ordered in ED Medications  heparin ADULT infusion 100 units/mL (25000 units/232mL sodium chloride 0.45%) (1,650 Units/hr Intravenous New Bag/Given 09/15/17 2108)  Fluticasone-Umeclidin-Vilant 100-62.5-25 MCG/INH AEPB 1 puff (has no administration in time range)  Melatonin TABS 10 mg (10 mg Oral Given 09/15/17 2336)  rOPINIRole (REQUIP XL) 24 hr tablet 4 mg (4 mg Oral Provided for home use 09/15/17 2334)  ALPRAZolam (XANAX) tablet 0.5 mg (has no administration in time range)  ondansetron (ZOFRAN) tablet 4 mg (has no administration in time range)    Or  ondansetron (ZOFRAN) injection 4 mg (has no  administration in time range)  acetaminophen (TYLENOL) tablet 650 mg (has no administration in time range)    Or  acetaminophen (TYLENOL) suppository 650 mg (has no administration in time range)  albuterol (PROVENTIL) (2.5 MG/3ML) 0.083% nebulizer solution 2.5 mg (has no administration in time range)  insulin aspart (novoLOG) injection 0-9 Units (has no administration in time range)  heparin bolus via infusion 3,000 Units (3,000 Units Intravenous Bolus from Bag 09/15/17 2109)     Initial Impression / Assessment and Plan / ED Course  I have reviewed the triage vital signs and the nursing notes.  Pertinent labs & imaging results that were available during my care of the patient were  reviewed by me and considered in my medical decision making (see chart for details).     Patient presents with acute pulmonary embolism diagnosed as an outpatient.  She will be started on IV heparin.  Her initial blood work here is benign.  However given the acute PE with superficial venous thrombosis, she will need heparin and further work-up and supportive care.  Concern for possible right heart strain and so she will need echocardiogram.  Dr. Katrinka Blazing to admit.  Final Clinical Impressions(s) / ED Diagnoses   Final diagnoses:  Pulmonary embolism on right [facility])  Acute superficial venous thrombosis of left lower extremity    ED Discharge Orders    None       Pricilla Loveless, MD 09/15/17 2341

## 2017-09-15 NOTE — Progress Notes (Signed)
ANTICOAGULATION CONSULT NOTE   Pharmacy Consult for heparin Indication: acute pulmonary embolus  Allergies  Allergen Reactions  . Sulfa Antibiotics Hives    Patient Measurements: height 65 inches, weight 131 kg   Heparin Dosing Weight: 89 kg  Vital Signs: Temp: 97.6 F (36.4 C) (05/07 2010) Temp Source: Oral (05/07 2010) BP: 126/74 (05/07 2010) Pulse Rate: 81 (05/07 2010)  Labs: No results for input(s): HGB, HCT, PLT, APTT, LABPROT, INR, HEPARINUNFRC, HEPRLOWMOCWT, CREATININE, CKTOTAL, CKMB, TROPONINI in the last 72 hours.  CrCl cannot be calculated (Patient's most recent lab result is older than the maximum 21 days allowed.).  Assessment: Patient is a 52 y.o F presented to the ED from PCP office on 09/15/17 for workup to r/o PE. Chest CTA showed acute PE with evidence of right heart strain (submassive PE). To start heparin for PE.  Goal of Therapy:  Heparin level 0.3-0.7 units/ml Monitor platelets by anticoagulation protocol: Yes   Plan:  - heparin 3000 units IV bolus, then drip at 1650 units/hr (per [place] calc) - check 6 hr he[arin level - monitor for s/s bleeding  Erika Woods P 09/15/2017,8:34 PM

## 2017-09-15 NOTE — ED Triage Notes (Signed)
Comes from pcp, want to rule out a pe.  Provider made aware.

## 2017-09-15 NOTE — ED Triage Notes (Signed)
Provider in room got story

## 2017-09-15 NOTE — H&P (Addendum)
History and Physical    Erika Woods:[id number] DOB: [birth year] DOA: 09/15/2017  Referring MD/NP/PA: Dr. Pricilla Loveless PCP: Dois Davenport, MD  Patient coming from: Home  Chief Complaint: Shortness of breath  I have personally briefly reviewed patient's old medical records in [facility]   HPI: Erika Woods is a 52 y.o. female with medical history significant of DM type II, anxiety, morbid obesity, and OSA on CPAP; who presents with complaints of shortness of breath since yesterday morning.  She noted having new worsening shortness of breath and seemed to even be present during rest along wth a heaviness feeling on her chest.  Due to her symptoms she went to her PCP the other day and lab work was obtained.  She was noted to have elevated d-dimer and was sent for lower extremity Doppler ultrasounds today.  The Doppler ultrasound noted a superficial occlusive clot in the greater saphenous vein in the thigh without invasion into saphenofemoral junction.  She was thereafter sent for CT angiogram of the chest which revealed scan and acute pulmonary embolus with signs of right heart strain.  She denies any specific leg pain, but noted some pain with ultrasound evaluation.  Patient deniesany loss of consciousness, palpitations, being sedentary, use of hormone replacement therapy, or malignancy history.  She is unsure of any history of clotting disorder as she is adopted.  She does report recent surgery on her right knee back in March as a possible risk factor.  Has had a persistent dry cough for weeks now with intermittent wheezing.   ED Course: On admission into the emergency department patient was noted to have stable vital signs with O2 saturations maintained on room air.  Heparin drip was ordered for recent CT angiogram study showing pulmonary embolus.  TRH called to admit for observation.  Review of Systems  Constitutional: Negative for chills, fever and weight loss.  HENT: Negative  for congestion and ear discharge.   Eyes: Negative for double vision and photophobia.  Respiratory: Positive for shortness of breath and wheezing.   Cardiovascular: Positive for chest pain (Chest heaviness). Negative for leg swelling.  Gastrointestinal: Negative for abdominal pain, nausea and vomiting.  Genitourinary: Negative for dysuria and hematuria.  Musculoskeletal: Positive for myalgias. Negative for falls and neck pain.  Skin: Negative for itching and rash.  Neurological: Negative for speech change, loss of consciousness and weakness.  Psychiatric/Behavioral: Negative for memory loss and substance abuse.    Past Medical History:  Diagnosis Date  . Diabetes mellitus without complication ([name])   . Obstructive sleep apnea on CPAP   . Renal disorder    kideny stones    Past Surgical History:  Procedure Laterality Date  . arm surgery Bilateral Skin removal  . DILATION AND EVACUATION    . KIDNEY STONE SURGERY    . TONSILLECTOMY       reports that she has never smoked. She has never used smokeless tobacco. She reports that she drinks alcohol. She reports that she does not use drugs.  Allergies  Allergen Reactions  . Sulfa Antibiotics Hives    Family History  Adopted: Yes    Prior to Admission medications   Medication Sig Start Date End Date Taking? Authorizing Provider  ALPRAZolam Prudy Feeler) 0.5 MG tablet Take 0.5 mg by mouth 2 (two) times daily as needed for anxiety.  06/26/15  Yes [provider]  clonazePAM (KLONOPIN) 1 MG tablet Take 1 mg by mouth 2 (two) times daily as needed  for anxiety.   Yes [provider]  Fluticasone-Umeclidin-Vilant (TRELEGY ELLIPTA) 100-62.5-25 MCG/INH AEPB Inhale 1 puff into the lungs daily as needed (wheezing.).   Yes [provider]  ibuprofen (ADVIL,MOTRIN) 200 MG tablet Take 200 mg by mouth every 6 (six) hours as needed for pain.   Yes [provider]  Melatonin 5 MG TABS Take 10 mg by mouth at bedtime.    Yes [provider]  metFORMIN (GLUCOPHAGE) 500 MG tablet Take 500 mg by mouth 2 (two) times daily with a meal.  10/28/13  Yes [provider]  PRISTIQ 100 MG 24 hr tablet Take 100 mg by mouth daily.  08/06/15  Yes [provider]  rOPINIRole (REQUIP XL) 4 MG 24 hr tablet Take 4 mg by mouth daily. 08/18/17  Yes [provider]  diclofenac (CATAFLAM) 50 MG tablet Take 1 tablet (50 mg total) by mouth daily. Take for 7 days in a row, then as needed. Patient not taking: Reported on 09/15/2017 02/23/17   Garth Bigness, MD  diclofenac (VOLTAREN) 75 MG EC tablet Take one tablet twice a day for 10 days, then take as needed.  Take with food Patient not taking: Reported on 09/15/2017 09/20/15   Ralene Cork, DO    Physical Exam:  Constitutional: Overly obese female in NAD, calm, comfortable Vitals:   09/15/17 2040 09/15/17 2041 09/15/17 2100 09/15/17 2130  BP:  117/78 109/78 120/76  Pulse:  74 77 65  Resp:  Temp:      TempSrc:      SpO2: 95% 96% 95% 96%  Weight:      Height:       Eyes: PERRL, lids and conjunctivae normal ENMT: Mucous membranes are moist. Posterior pharynx clear of any exudate or lesions.Normal dentition.  Neck: normal, supple, no masses, no thyromegaly Respiratory: clear to auscultation bilaterally, no wheezing, no crackles. Normal respiratory effort. No accessory muscle use.  Cardiovascular: Regular rate and rhythm, no murmurs / rubs / gallops. No extremity edema. 2+ pedal pulses. No carotid bruits.  Abdomen: no tenderness, no masses palpated. No hepatosplenomegaly. Bowel sounds positive.  Musculoskeletal: no clubbing / cyanosis. No joint deformity upper and lower extremities. Good ROM, no contractures. Normal muscle tone.  Skin: no rashes, lesions, ulcers. No induration Neurologic: CN 2-12 grossly intact. Sensation intact, DTR normal. Strength 5/5 in all 4.  Psychiatric: Normal judgment and insight. Alert and oriented x 3.  Normal mood.     Labs on Admission: I have personally reviewed following labs and imaging studies  CBC: Recent Labs  Lab 09/15/17 2003  WBC 7.8  NEUTROABS 5.0  HGB 12.4  HCT 39.4  MCV 91.6  PLT 247   Basic Metabolic Panel: Recent Labs  Lab 09/15/17 2003  NA 143  K 4.0  CL 108  CO2 25  GLUCOSE 113*  BUN 16  CREATININE 0.93  CALCIUM 9.2   GFR: Estimated Creatinine Clearance: 95.9 mL/min (by C-G formula based on SCr of 0.93 mg/dL). Liver Function Tests: No results for input(s): AST, ALT, ALKPHOS, BILITOT, PROT, ALBUMIN in the last 168 hours. No results for input(s): LIPASE, AMYLASE in the last 168 hours. No results for input(s): AMMONIA in the last 168 hours. Coagulation Profile: No results for input(s): INR, PROTIME in the last 168 hours. Cardiac Enzymes: Recent Labs  Lab 09/15/17 2003  TROPONINI <0.03   BNP (last 3 results) No results for input(s): PROBNP in the last 8760 hours. HbA1C: No results for  input(s): HGBA1C in the last 72 hours. CBG: No results for input(s): GLUCAP in the last 168 hours. Lipid Profile: No results for input(s): CHOL, HDL, LDLCALC, TRIG, CHOLHDL, LDLDIRECT in the last 72 hours. Thyroid Function Tests: No results for input(s): TSH, T4TOTAL, FREET4, T3FREE, THYROIDAB in the last 72 hours. Anemia Panel: No results for input(s): VITAMINB12, FOLATE, FERRITIN, TIBC, IRON, RETICCTPCT in the last 72 hours. Urine analysis: No results found for: COLORURINE, APPEARANCEUR, LABSPEC, PHURINE, GLUCOSEU, HGBUR, BILIRUBINUR, KETONESUR, PROTEINUR, UROBILINOGEN, NITRITE, LEUKOCYTESUR Sepsis Labs: No results found for this or any previous visit (from the past 240 hour(s)).   Radiological Exams on Admission: Ct Angio Chest Pe W Or Wo Contrast  Addendum Date: 09/15/2017   ADDENDUM REPORT: 09/15/2017 17:58 ADDENDUM: Critical Value/emergent results were called by telephone at the time of interpretation on 09/15/2017 at 5:57 pm to Dr. Nadyne Coombes , who  verbally acknowledged these results. Electronically Signed   By: Lupita Raider, M.D.   On: 09/15/2017 17:58   Result Date: 09/15/2017 CLINICAL DATA:  Shortness of breath, elevated D-dimer level. EXAM: CT ANGIOGRAPHY CHEST WITH CONTRAST TECHNIQUE: Multidetector CT imaging of the chest was performed using the standard protocol during bolus administration of intravenous contrast. Multiplanar CT image reconstructions and MIPs were obtained to evaluate the vascular anatomy. CONTRAST:  75mL ISOVUE-370 IOPAMIDOL (ISOVUE-370) INJECTION 76% COMPARISON:  Radiographs of March 19, 2009. FINDINGS: Cardiovascular: Linear filling defect is seen in right main pulmonary artery and lower lobe branches consistent with acute pulmonary embolus. RV/LV ratio of 1 is noted suggesting right heart strain. 4.0 cm ascending thoracic aortic aneurysm is noted. No dissection is noted. Mediastinum/Nodes: No enlarged mediastinal, hilar, or axillary lymph nodes. Thyroid gland, trachea, and esophagus demonstrate no significant findings. Lungs/Pleura: Lungs are clear. No pleural effusion or pneumothorax. Upper Abdomen: Fatty infiltration of the liver is noted. No other abnormality seen in visualized portion of upper abdomen. Musculoskeletal: No chest wall abnormality. No acute or significant osseous findings. Review of the MIP images confirms the above findings. IMPRESSION: Acute pulmonary embolus is noted in right main pulmonary artery and lower lobe branches. Positive for acute PE with CT evidence of right heart strain (RV/LV Ratio = 1.0) consistent with at least submassive (intermediate risk) PE. The presence of right heart strain has been associated with an increased risk of morbidity and mortality. Please activate Code PE by paging [phone]. 4.0 cm ascending thoracic aortic aneurysm is noted. Recommend annual imaging followup by CTA or MRA. This recommendation follows 2010 ACCF/AHA/AATS/ACR/ASA/SCA/SCAI/SIR/STS/SVM Guidelines for the  Diagnosis and Management of Patients with Thoracic Aortic Disease. Circulation. 2010; 121: U981-X914. Fatty infiltration of the liver. Electronically Signed: By: Lupita Raider, M.D. On: 09/15/2017 17:44    EKG: Independently reviewed.  Sinus rhythm at 77 bpm with RBBB and LAFB similar to previous  Assessment/Plan Pulmonary embolus, superficial greater saphenous vein thrombosis: Acute.  Patient presents with complaints of shortness of breath.  Work-up revealed first a superficial greater saphenous vein thrombosis in the thigh, and subsequently an acute pulmonary embolus with signs of right heart strain on CT angiogram.  Risk factors include right knee surgery in March 2019. - Admit to a telemetry bed - Continuous pulse oximetry with nasal cannula oxygen if needed - Heparin drip per pharmacy - Check echocardiogram - Determine best anticoagulant agent - DuoNeb's as needed for shortness of breath/wheezing  Diabetes mellitus type 2: - Hypoglycemic protocols - Hold metformin - CBGs q. before meals and at bedtime with sensitive SSI  Anxiety:  Patient appears to have recently been prescribed Klonopin last filled 08/26/2017 at 60 tablets and subsequently 90 pills of Xanax on 4/23 by her PCP. - Continued Xanax prn anxiety  Morbid obesity: BMI 48.26  OSA on CPAP: Stable - Continue home CPAP machine   DVT prophylaxis: Heparin Code Status: Full Family Communication: Discussed plan of care with patient and family present at bedside Disposition Plan: Likely discharge home tomorrow. Consults called: None Admission status: Observation  Clydie Braun MD [facility] Pager [phone]   If 7PM-7AM, please contact night-coverage [email or web address] Password [email or web address]  09/15/2017, 10:13 PM

## 2017-09-15 NOTE — ED Notes (Signed)
Per conversation with JC in pharm  Pt to take her home dose of requip mg.  Form filled in and drug sent pharmacy

## 2017-09-16 ENCOUNTER — Other Ambulatory Visit: Payer: Self-pay

## 2017-09-16 ENCOUNTER — Encounter (HOSPITAL_COMMUNITY): Payer: Self-pay | Admitting: Internal Medicine

## 2017-09-16 ENCOUNTER — Inpatient Hospital Stay (HOSPITAL_COMMUNITY): Payer: BLUE CROSS/BLUE SHIELD

## 2017-09-16 DIAGNOSIS — E118 Type 2 diabetes mellitus with unspecified complications: Secondary | ICD-10-CM | POA: Diagnosis not present

## 2017-09-16 DIAGNOSIS — F419 Anxiety disorder, unspecified: Secondary | ICD-10-CM | POA: Diagnosis present

## 2017-09-16 DIAGNOSIS — G4733 Obstructive sleep apnea (adult) (pediatric): Secondary | ICD-10-CM | POA: Diagnosis present

## 2017-09-16 DIAGNOSIS — I82812 Embolism and thrombosis of superficial veins of left lower extremities: Secondary | ICD-10-CM | POA: Diagnosis present

## 2017-09-16 DIAGNOSIS — I471 Supraventricular tachycardia: Secondary | ICD-10-CM | POA: Diagnosis present

## 2017-09-16 DIAGNOSIS — Z6841 Body Mass Index (BMI) 40.0 and over, adult: Secondary | ICD-10-CM | POA: Diagnosis not present

## 2017-09-16 DIAGNOSIS — Z882 Allergy status to sulfonamides status: Secondary | ICD-10-CM | POA: Diagnosis not present

## 2017-09-16 DIAGNOSIS — I2609 Other pulmonary embolism with acute cor pulmonale: Secondary | ICD-10-CM | POA: Diagnosis present

## 2017-09-16 DIAGNOSIS — I351 Nonrheumatic aortic (valve) insufficiency: Secondary | ICD-10-CM

## 2017-09-16 DIAGNOSIS — R0602 Shortness of breath: Secondary | ICD-10-CM | POA: Diagnosis present

## 2017-09-16 DIAGNOSIS — R7989 Other specified abnormal findings of blood chemistry: Secondary | ICD-10-CM | POA: Diagnosis present

## 2017-09-16 DIAGNOSIS — Z87442 Personal history of urinary calculi: Secondary | ICD-10-CM | POA: Diagnosis not present

## 2017-09-16 DIAGNOSIS — F39 Unspecified mood [affective] disorder: Secondary | ICD-10-CM | POA: Diagnosis present

## 2017-09-16 DIAGNOSIS — Z79899 Other long term (current) drug therapy: Secondary | ICD-10-CM | POA: Diagnosis not present

## 2017-09-16 DIAGNOSIS — R0682 Tachypnea, not elsewhere classified: Secondary | ICD-10-CM | POA: Diagnosis present

## 2017-09-16 DIAGNOSIS — I712 Thoracic aortic aneurysm, without rupture, unspecified: Secondary | ICD-10-CM | POA: Diagnosis present

## 2017-09-16 DIAGNOSIS — I2699 Other pulmonary embolism without acute cor pulmonale: Secondary | ICD-10-CM | POA: Diagnosis not present

## 2017-09-16 DIAGNOSIS — E119 Type 2 diabetes mellitus without complications: Secondary | ICD-10-CM | POA: Diagnosis present

## 2017-09-16 DIAGNOSIS — E66813 Obesity, class 3: Secondary | ICD-10-CM

## 2017-09-16 DIAGNOSIS — G4730 Sleep apnea, unspecified: Secondary | ICD-10-CM | POA: Diagnosis not present

## 2017-09-16 DIAGNOSIS — Z7984 Long term (current) use of oral hypoglycemic drugs: Secondary | ICD-10-CM | POA: Diagnosis not present

## 2017-09-16 HISTORY — DX: Morbid (severe) obesity due to excess calories: E66.01

## 2017-09-16 HISTORY — DX: Other pulmonary embolism with acute cor pulmonale: I26.09

## 2017-09-16 HISTORY — DX: Obesity, class 3: E66.813

## 2017-09-16 LAB — GLUCOSE, CAPILLARY
GLUCOSE-CAPILLARY: 133 mg/dL — AB (ref 65–99)
Glucose-Capillary: 104 mg/dL — ABNORMAL HIGH (ref 65–99)
Glucose-Capillary: 115 mg/dL — ABNORMAL HIGH (ref 65–99)
Glucose-Capillary: 116 mg/dL — ABNORMAL HIGH (ref 65–99)

## 2017-09-16 LAB — CBC
HCT: 36.1 % (ref 36.0–46.0)
HEMOGLOBIN: 11.4 g/dL — AB (ref 12.0–15.0)
MCH: 29.2 pg (ref 26.0–34.0)
MCHC: 31.6 g/dL (ref 30.0–36.0)
MCV: 92.3 fL (ref 78.0–100.0)
Platelets: 208 10*3/uL (ref 150–400)
RBC: 3.91 MIL/uL (ref 3.87–5.11)
RDW: 14.4 % (ref 11.5–15.5)
WBC: 7.6 10*3/uL (ref 4.0–10.5)

## 2017-09-16 LAB — HEPARIN LEVEL (UNFRACTIONATED)
HEPARIN UNFRACTIONATED: 0.51 [IU]/mL (ref 0.30–0.70)
HEPARIN UNFRACTIONATED: 0.8 [IU]/mL — AB (ref 0.30–0.70)
HEPARIN UNFRACTIONATED: 0.31 [IU]/mL (ref 0.30–0.70)

## 2017-09-16 LAB — ECHOCARDIOGRAM COMPLETE
Height: 65 in
WEIGHTICAEL: 4796.8 [oz_av]

## 2017-09-16 LAB — HIV ANTIBODY (ROUTINE TESTING W REFLEX): HIV SCREEN 4TH GENERATION: NONREACTIVE

## 2017-09-16 MED ORDER — ALBUTEROL SULFATE (2.5 MG/3ML) 0.083% IN NEBU: 2.5000 mg | INHALATION_SOLUTION | RESPIRATORY_TRACT | Status: DC | PRN

## 2017-09-16 MED ORDER — HEPARIN (PORCINE) IN NACL 100-0.45 UNIT/ML-% IJ SOLN
1450.0000 [IU]/h | INTRAMUSCULAR | Status: AC
  Administered 2017-09-17: 1450 [IU]/h via INTRAVENOUS
  Filled 2017-09-16: qty 250

## 2017-09-16 MED ORDER — CLONAZEPAM 1 MG PO TABS
1.0000 mg | ORAL_TABLET | Freq: Two times a day (BID) | ORAL | Status: DC
  Administered 2017-09-16 – 2017-09-17 (×2): 1 mg via ORAL
  Filled 2017-09-16 (×2): qty 1

## 2017-09-16 MED ORDER — HEPARIN (PORCINE) IN NACL 100-0.45 UNIT/ML-% IJ SOLN
1350.0000 [IU]/h | INTRAMUSCULAR | Status: DC
  Administered 2017-09-16: 1350 [IU]/h via INTRAVENOUS
  Filled 2017-09-16: qty 250

## 2017-09-16 NOTE — Progress Notes (Signed)
ANTICOAGULATION CONSULT NOTE - Follow Up Consult  Pharmacy Consult for heparin Indication: acute pulmonary embolus   Allergies  Allergen Reactions  . Sulfa Antibiotics Hives    Patient Measurements: Height:  (165.1 cm) Weight: 299 lb 12.8 oz (136 kg) IBW/kg (Calculated) : 57 Heparin Dosing Weight:   Vital Signs: Temp: 97.3 F (36.3 C) (05/08 0155) Temp Source: Oral (05/08 0155) BP: 135/73 (05/08 0155) Pulse Rate: 59 (05/08 0155)  Labs: Recent Labs    09/15/17 2003 09/16/17 0249  HGB 12.4 11.4*  HCT 39.4 36.1  PLT 247 208  HEPARINUNFRC  --  0.51  CREATININE 0.93  --   TROPONINI <0.03  --     Estimated Creatinine Clearance: 97.8 mL/min (by C-G formula based on SCr of 0.93 mg/dL).   Medications:  Infusions:  . heparin 1,650 Units/hr (09/15/17 2108)    Assessment: Patient with heparin level at goal.  No heparin issues noted.  Goal of Therapy:  Heparin level 0.3-0.7 units/ml Monitor platelets by anticoagulation protocol: Yes   Plan:  Continue heparin drip at current rate Recheck level at 1000  Erika Woods, Erika Woods 09/16/2017,5:09 AM

## 2017-09-16 NOTE — Progress Notes (Signed)
PROGRESS NOTE                                                                                                                                                                                                             Patient Demographics:    Erika Woods, is a 52 y.o. female, DOB - Dec 25, 1965, ZOX:[id number]  Admit date - 09/15/2017   Admitting Physician Clydie Braun, MD  Outpatient Primary MD for the patient is Dois Davenport, MD  LOS - 0  Outpatient Specialists: Dr Yisroel Ramming ( orthopedics)  Chief Complaint  Patient presents with  . Pulmonary Infiltrate    possiable pe sent from pcp        Brief Narrative   52 year old morbidly obese female with history of type 2 diabetes mellitus, OSA on CPAP and anxiety who was sent to the ED by PCP with increasing shortness of breath and chest tightness for past 2 days.  This was also associated with pain in her left inner thigh.  Patient reports having a right knee arthroscopy done for loose cartilage about 2 months back.  She was found to have a meniscus tear that was repaired.  When seen by her PCP 2 days back when she was found to be short of breath and had chest tightness.  She received some breathing treatment and symptoms got better and sent home.  She had blood work done including a d-dimer which was elevated .  She was then sent for a lower extremity Doppler ultrasound which was positive for a superficial occlusive clot in the greater saphenous vein in the left thigh.  She was then sent for a CT angiogram of the chest which was positive for acute pulmonary embolus with signs of right heart strain. Patient admitted for IV anticoagulation and further monitoring. Vitals in the ED were stable except for mild tachypnea.  Had normal sats on room air.   Subjective:   Patient has not been out of bed and denies further chest discomfort or shortness of breath.   Assessment  & Plan :     Principal Problem: Acute pulmonary emboli with?  Cor pulmonale ([name]) Likely contributed by left leg DVT following right knee procedure 2 months back, with limited mobility for a short duration thereafter. Patient is hemodynamically stable and no issues on cardiac monitor. Started on IV  heparin drip.  2D echo ordered to evaluate right heart strain. Given the clot burden and symptoms on presentation I will monitor her overnight and if 2D echo is stable and no hemodynamic compromise overnight I will discharge her on NOAC.  (I discussed all different anticoagulation options with their benefits and side effects in detail.  Patient wishes to be on NOAC). Given the cause of her PE to be from recent orthopedic procedure and the size of the clot burden she should be treated for at least 6 months.   Active Problems:   DM (diabetes mellitus) type II, controlled ([name]) On metformin at home which is held.  Stable on sliding scale coverage.  Obstructive sleep apnea  CPAP at night    Mood disorder ([name])   Obesity, Class III, BMI 40-49.9 (morbid obesity) ([name]) Counseled on diet and exercise to lose weight.  Ascending aortic aneurysm Incidental finding on CT chest of a 4 cm ascending thoracic aortic aneurysm.  Based on guidelines recommend annual imaging follow-up by a CT angiogram or MRA.  The findings were discussed with the patient.       Code Status : Full code  Family Communication  : None at bedside  Disposition Plan  : Home tomorrow if clinically stable overnight.    Barriers For Discharge : Active symptoms.  Pending 2D echo.  Consults  : None  Procedures CT angiogram of the chest, 2D echo  DVT Prophylaxis  : IV heparin  Lab Results  Component Value Date   PLT 208 09/16/2017    Antibiotics  :   Anti-infectives (From admission, onward)   None        Objective:   Vitals:   09/15/17 2336 09/16/17 0000 09/16/17 0030 09/16/17 0155  BP: 124/73 109/69 116/72 135/73   Pulse: 63 62 (!) 59 (!) 59  Resp: (!) Temp: 97.6 F (36.4 C)   (!) 97.3 F (36.3 C)  TempSrc: Oral   Oral  SpO2: 97% 97% 97% 99%  Weight:    136 kg (299 lb 12.8 oz)  Height:        Wt Readings from Last 3 Encounters:  09/16/17 136 kg (299 lb 12.8 oz)  06/08/17 127 kg (280 lb)  02/23/17 127 kg (280 lb)     Intake/Output Summary (Last 24 hours) at 09/16/2017 0959 Last data filed at 09/16/2017 0733 Gross per 24 hour  Intake 160.88 ml  Output 800 ml  Net -639.12 ml     Physical Exam  Gen: Morbidly obese female not in distress HEENT: no pallor, moist mucosa, supple neck Chest: clear b/l, no added sounds CVS: N S1&S2, no murmurs, GI: soft, NT, ND,,  Musculoskeletal: warm, no edema, no left thigh tenderness or swelling     Data Review:    CBC Recent Labs  Lab 09/15/17 2003 09/16/17 0249  WBC 7.8 7.6  HGB 12.4 11.4*  HCT 39.4 36.1  PLT 247 208  MCV 91.6 92.3  MCH 28.8 29.2  MCHC 31.5 31.6  RDW 14.3 14.4  LYMPHSABS 2.0  --   MONOABS 0.4  --   EOSABS 0.4  --   BASOSABS 0.0  --     Chemistries  Recent Labs  Lab 09/15/17 2003  NA 143  K 4.0  CL 108  CO2 25  GLUCOSE 113*  BUN 16  CREATININE 0.93  CALCIUM 9.2   ------------------------------------------------------------------------------------------------------------------ No results for input(s): CHOL, HDL, LDLCALC, TRIG, CHOLHDL, LDLDIRECT in the last 72  hours.  Lab Results  Component Value Date   HGBA1C 4.5 08/18/2011   ------------------------------------------------------------------------------------------------------------------ No results for input(s): TSH, T4TOTAL, T3FREE, THYROIDAB in the last 72 hours.  Invalid input(s): FREET3 ------------------------------------------------------------------------------------------------------------------ No results for input(s): VITAMINB12, FOLATE, FERRITIN, TIBC, IRON, RETICCTPCT in the last 72 hours.  Coagulation profile No results  for input(s): INR, PROTIME in the last 168 hours.  No results for input(s): DDIMER in the last 72 hours.  Cardiac Enzymes Recent Labs  Lab 09/15/17 2003  TROPONINI <0.03   ------------------------------------------------------------------------------------------------------------------    Component Value Date/Time   BNP 34.2 09/15/2017 2003    Inpatient Medications  Scheduled Meds: . insulin aspart  0-9 Units Subcutaneous TID WC  . Melatonin  10 mg Oral QHS  . rOPINIRole  4 mg Oral QHS   Continuous Infusions: . heparin 1,650 Units/hr (09/15/17 2108)   PRN Meds:.acetaminophen **OR** acetaminophen, albuterol, ALPRAZolam, Fluticasone-Umeclidin-Vilant, ondansetron **OR** ondansetron (ZOFRAN) IV  Micro Results No results found for this or any previous visit (from the past 240 hour(s)).  Radiology Reports Ct Angio Chest Pe W Or Wo Contrast  Addendum Date: 09/15/2017   ADDENDUM REPORT: 09/15/2017 17:58 ADDENDUM: Critical Value/emergent results were called by telephone at the time of interpretation on 09/15/2017 at 5:57 pm to Dr. Nadyne Coombes , who verbally acknowledged these results. Electronically Signed   By: Lupita Raider, M.D.   On: 09/15/2017 17:58   Result Date: 09/15/2017 CLINICAL DATA:  Shortness of breath, elevated D-dimer level. EXAM: CT ANGIOGRAPHY CHEST WITH CONTRAST TECHNIQUE: Multidetector CT imaging of the chest was performed using the standard protocol during bolus administration of intravenous contrast. Multiplanar CT image reconstructions and MIPs were obtained to evaluate the vascular anatomy. CONTRAST:  75mL ISOVUE-370 IOPAMIDOL (ISOVUE-370) INJECTION 76% COMPARISON:  Radiographs of March 19, 2009. FINDINGS: Cardiovascular: Linear filling defect is seen in right main pulmonary artery and lower lobe branches consistent with acute pulmonary embolus. RV/LV ratio of 1 is noted suggesting right heart strain. 4.0 cm ascending thoracic aortic aneurysm is noted. No  dissection is noted. Mediastinum/Nodes: No enlarged mediastinal, hilar, or axillary lymph nodes. Thyroid gland, trachea, and esophagus demonstrate no significant findings. Lungs/Pleura: Lungs are clear. No pleural effusion or pneumothorax. Upper Abdomen: Fatty infiltration of the liver is noted. No other abnormality seen in visualized portion of upper abdomen. Musculoskeletal: No chest wall abnormality. No acute or significant osseous findings. Review of the MIP images confirms the above findings. IMPRESSION: Acute pulmonary embolus is noted in right main pulmonary artery and lower lobe branches. Positive for acute PE with CT evidence of right heart strain (RV/LV Ratio = 1.0) consistent with at least submassive (intermediate risk) PE. The presence of right heart strain has been associated with an increased risk of morbidity and mortality. Please activate Code PE by paging [phone]. 4.0 cm ascending thoracic aortic aneurysm is noted. Recommend annual imaging followup by CTA or MRA. This recommendation follows 2010 ACCF/AHA/AATS/ACR/ASA/SCA/SCAI/SIR/STS/SVM Guidelines for the Diagnosis and Management of Patients with Thoracic Aortic Disease. Circulation. 2010; 121: N027-O536. Fatty infiltration of the liver. Electronically Signed: By: Lupita Raider, M.D. On: 09/15/2017 17:44    Time Spent in minutes  25   Pryce Folts M.D on 09/16/2017 at 9:59 AM  Between 7am to 7pm - Pager - [phone]  After 7pm go to [email or web address] - password [facility]  [facility] -  Office  [phone]

## 2017-09-16 NOTE — ED Notes (Signed)
. ED TO INPATIENT HANDOFF REPORT  Name/Age/Gender Erika Woods 52 y.o. female  Code Status    Code Status Orders  (From admission, onward)        Start     Ordered   09/15/17 2222  Full code  Continuous     09/15/17 2225    Code Status History    Date Active Date Inactive Code Status Order ID Comments User Context   12/23/2016 2159 12/25/2016 1515 Full Code [id number]  Lacretia Leigh, MD ED   01/31/2016 2346 02/01/2016 1316 Full Code [id number]  Deno Etienne, DO ED      Home/SNF/Other Home  Chief Complaint Dr. Durene Cal for Admission  Level of Care/Admitting Diagnosis ED Disposition    ED Disposition Condition [place]: [facility] [100102]  Level of Care: Telemetry [5]  Admit to tele based on following criteria: Complex arrhythmia (Bradycardia/Tachycardia)  Diagnosis: Pulmonary emboli [facility]) [[id number]]  Admitting Physician: Norval Morton [[id number]]  Attending Physician: Norval Morton [[id number]]  PT Class (Do Not Modify): Observation [104]  PT Acc Code (Do Not Modify): Observation [10022]       Medical History Past Medical History:  Diagnosis Date  . Diabetes mellitus without complication ([place])   . Obstructive sleep apnea on CPAP   . Renal disorder    kideny stones    Allergies Allergies  Allergen Reactions  . Sulfa Antibiotics Hives    IV Location/Drains/Wounds Patient Lines/Drains/Airways Status   Active Line/Drains/Airways    Name:   Placement date:   Placement time:   Site:   Days:   Peripheral IV 12/23/16 Antecubital   12/23/16    2100    Antecubital   267   Peripheral IV 09/15/17 Left Hand   09/15/17    2035    Hand   1   Peripheral IV 09/15/17 Right Antecubital   09/15/17    2036    Antecubital   1          Labs/Imaging Results for orders placed or performed during the hospital encounter of 09/15/17 (from the past 48 hour(s))  Basic metabolic panel     Status: Abnormal   Collection Time: 09/15/17  8:03  PM  Result Value Ref Range   Sodium 143 135 - 145 mmol/L   Potassium 4.0 3.5 - 5.1 mmol/L   Chloride 108 101 - 111 mmol/L   CO2 25 22 - 32 mmol/L   Glucose, Bld 113 (H) 65 - 99 mg/dL   BUN 16 6 - 20 mg/dL   Creatinine, Ser 0.93 0.44 - 1.00 mg/dL   Calcium 9.2 8.9 - 10.3 mg/dL   GFR calc non Af Amer >60 >60 mL/min   GFR calc Af Amer >60 >60 mL/min    Comment: (NOTE) The eGFR has been calculated using the CKD EPI equation. This calculation has not been validated in all clinical situations. eGFR's persistently <60 mL/min signify possible Chronic Kidney Disease.    Anion gap 10 5 - 15    Comment: Performed at [facility], [place] [address] [place], [place] [address]  Brain natriuretic peptide     Status: None   Collection Time: 09/15/17  8:03 PM  Result Value Ref Range   B Natriuretic Peptide 34.2 0.0 - 100.0 pg/mL    Comment: Performed at [facility], [place] [address] [place],  [address]  Troponin I     Status: None   Collection Time:  09/15/17  8:03 PM  Result Value Ref Range   Troponin I <0.03 <0.03 ng/mL    Comment: Performed at [facility], [place] [address], [place], [place] [address]  CBC with Differential     Status: None   Collection Time: 09/15/17  8:03 PM  Result Value Ref Range   WBC 7.8 4.0 - 10.5 K/uL   RBC 4.30 3.87 - 5.11 MIL/uL   Hemoglobin 12.4 12.0 - 15.0 g/dL   HCT 39.4 36.0 - 46.0 %   MCV 91.6 78.0 - 100.0 fL   MCH 28.8 26.0 - 34.0 pg   MCHC 31.5 30.0 - 36.0 g/dL   RDW 14.3 11.5 - 15.5 %   Platelets 247 150 - 400 K/uL   Neutrophils Relative % 63 %   Neutro Abs 5.0 1.7 - 7.7 K/uL   Lymphocytes Relative 26 %   Lymphs Abs 2.0 0.7 - 4.0 K/uL   Monocytes Relative 5 %   Monocytes Absolute 0.4 0.1 - 1.0 K/uL   Eosinophils Relative 5 %   Eosinophils Absolute 0.4 0.0 - 0.7 K/uL   Basophils Relative 1 %   Basophils Absolute 0.0 0.0 - 0.1 K/uL    Comment: Performed at [facility],  [place] [address], [place], [place] [address]   Ct Angio Chest Pe W Or Wo Contrast  Addendum Date: 09/15/2017   ADDENDUM REPORT: 09/15/2017 17:58 ADDENDUM: Critical Value/emergent results were called by telephone at the time of interpretation on 09/15/2017 at 5:57 pm to Dr. Horald Pollen , who verbally acknowledged these results. Electronically Signed   By: Marijo Conception, M.D.   On: 09/15/2017 17:58   Result Date: 09/15/2017 CLINICAL DATA:  Shortness of breath, elevated D-dimer level. EXAM: CT ANGIOGRAPHY CHEST WITH CONTRAST TECHNIQUE: Multidetector CT imaging of the chest was performed using the standard protocol during bolus administration of intravenous contrast. Multiplanar CT image reconstructions and MIPs were obtained to evaluate the vascular anatomy. CONTRAST:  44m ISOVUE-370 IOPAMIDOL (ISOVUE-370) INJECTION 76% COMPARISON:  Radiographs of March 19, 2009. FINDINGS: Cardiovascular: Linear filling defect is seen in right main pulmonary artery and lower lobe branches consistent with acute pulmonary embolus. RV/LV ratio of 1 is noted suggesting right heart strain. 4.0 cm ascending thoracic aortic aneurysm is noted. No dissection is noted. Mediastinum/Nodes: No enlarged mediastinal, hilar, or axillary lymph nodes. Thyroid gland, trachea, and esophagus demonstrate no significant findings. Lungs/Pleura: Lungs are clear. No pleural effusion or pneumothorax. Upper Abdomen: Fatty infiltration of the liver is noted. No other abnormality seen in visualized portion of upper abdomen. Musculoskeletal: No chest wall abnormality. No acute or significant osseous findings. Review of the MIP images confirms the above findings. IMPRESSION: Acute pulmonary embolus is noted in right main pulmonary artery and lower lobe branches. Positive for acute PE with CT evidence of right heart strain (RV/LV Ratio = 1.0) consistent with at least submassive (intermediate risk) PE. The presence of right heart strain has been associated with  an increased risk of morbidity and mortality. Please activate Code PE by paging [phone] 4.0 cm ascending thoracic aortic aneurysm is noted. Recommend annual imaging followup by CTA or MRA. This recommendation follows 2010 ACCF/AHA/AATS/ACR/ASA/SCA/SCAI/SIR/STS/SVM Guidelines for the Diagnosis and Management of Patients with Thoracic Aortic Disease. Circulation. 2010; 121:: O671-I458 Fatty infiltration of the liver. Electronically Signed: By: JMarijo Conception M.D. On: 09/15/2017 17:44    Pending Labs Unresulted Labs (From admission, onward)   Start     Ordered   09/16/17 0500  CBC  Daily,   R     09/15/17 2044   09/16/17 0500  HIV antibody (Routine Testing)  Tomorrow morning,   R     09/15/17 2225   09/16/17 0300  Heparin level (unfractionated)  Once-Timed,   R     09/15/17 2044      Vitals/Pain Today's Vitals   09/15/17 2130 09/15/17 2200 09/15/17 2230 09/15/17 2336  BP: 120/76 110/73 122/78 124/73  Pulse: 65 71 70 63  Resp: 14 16 (!) 23 (!) 24  Temp:    97.6 F (36.4 C)  TempSrc:    Oral  SpO2: 96% 97% 95% 97%  Weight:      Height:      PainSc:    0-No pain    Isolation Precautions No active isolations  Medications Medications  heparin ADULT infusion 100 units/mL (25000 units/211m sodium chloride 0.45%) (1,650 Units/hr Intravenous New Bag/Given 09/15/17 2108)  Fluticasone-Umeclidin-Vilant 100-62.5-25 MCG/INH AEPB 1 puff (has no administration in time range)  Melatonin TABS 10 mg (10 mg Oral Given 09/15/17 2336)  rOPINIRole (REQUIP XL) 24 hr tablet 4 mg (4 mg Oral Provided for home use 09/15/17 2334)  ALPRAZolam (XANAX) tablet 0.5 mg (has no administration in time range)  ondansetron (ZOFRAN) tablet 4 mg (has no administration in time range)    Or  ondansetron (ZOFRAN) injection 4 mg (has no administration in time range)  acetaminophen (TYLENOL) tablet 650 mg (has no administration in time range)    Or  acetaminophen (TYLENOL) suppository 650 mg (has no administration in  time range)  insulin aspart (novoLOG) injection 0-9 Units (has no administration in time range)  albuterol (PROVENTIL) (2.5 MG/3ML) 0.083% nebulizer solution 2.5 mg (has no administration in time range)  heparin bolus via infusion 3,000 Units (3,000 Units Intravenous Bolus from Bag 09/15/17 2109)    Mobility walks

## 2017-09-16 NOTE — Progress Notes (Signed)
ANTICOAGULATION CONSULT NOTE   Pharmacy Consult for heparin Indication: acute pulmonary embolus  Allergies  Allergen Reactions  . Sulfa Antibiotics Hives    Patient Measurements: height 65 inches, weight 131 kg Height:  (165.1 cm) Weight: 299 lb 12.8 oz (136 kg) IBW/kg (Calculated) : 57 Heparin Dosing Weight: 89 kg  Vital Signs: Temp: 97.3 F (36.3 C) (05/08 0155) Temp Source: Oral (05/08 0155) BP: 135/73 (05/08 0155) Pulse Rate: 59 (05/08 0155)  Labs: Recent Labs    09/15/17 2003 09/16/17 0249 09/16/17 0935  HGB 12.4 11.4*  --   HCT 39.4 36.1  --   PLT 247 208  --   HEPARINUNFRC  --  0.51 0.80*  CREATININE 0.93  --   --   TROPONINI <0.03  --   --     Estimated Creatinine Clearance: 97.8 mL/min (by C-G formula based on SCr of 0.93 mg/dL).  Assessment: Patient is a 52 y.o F presented to the ED from PCP office on 09/15/17 for workup to r/o PE. Chest CTA showed acute PE with evidence of right heart strain (submassive PE). To start heparin for PE.  Today, 09/16/17  Heparin level supratherapeutic on current rate of 1650 units/hr after being therapeutic early this AM on same rate  CBC OK  No reported bleeding per RN  Goal of Therapy:  Heparin level 0.3-0.7 units/ml Monitor platelets by anticoagulation protocol: Yes   Plan:  1) Decrease IV heparin from 1650 units/hr to 1350 units/hr 2) Recheck heparin level 6 hours after rate decrease 3) Daily heparin level and CBC   Hessie Knows, PharmD, BCPS Pager [phone] 09/16/2017 10:34 AM

## 2017-09-16 NOTE — Progress Notes (Signed)
ANTICOAGULATION CONSULT NOTE   Pharmacy Consult for heparin Indication: acute pulmonary embolus  Allergies  Allergen Reactions  . Sulfa Antibiotics Hives    Patient Measurements: Height:  (165.1 cm) Weight: 299 lb 12.8 oz (136 kg) IBW/kg (Calculated) : 57 Heparin Dosing Weight: 90.7 kg   Labs: Recent Labs    09/15/17 2003 09/16/17 0249 09/16/17 0935 09/16/17 1657  HGB 12.4 11.4*  --   --   HCT 39.4 36.1  --   --   PLT 247 208  --   --   HEPARINUNFRC  --  0.51 0.80* 0.31  CREATININE 0.93  --   --   --   TROPONINI <0.03  --   --   --     Estimated Creatinine Clearance: 97.8 mL/min (by C-G formula based on SCr of 0.93 mg/dL).  Assessment: 52 y/o F presented to the ED from PCP office on 09/15/17 for workup to r/o PE. CTa of chest showed acute PE with evidence of right heart strain (at least submassive PE). Pharmacy consulted to start heparin infusion. Patient not on anticoagulants PTA.  Today, 09/16/17  1657 heparin level = 0.31 units/mL, on low end of therapeutic range after heparin infusion decreased from 1650 units/hr to 1350 units/hr this AM  Hgb: 11.4, Pltc WNL  No reported bleeding or infusion issues per RN  Goal of Therapy:  Heparin level 0.3-0.7 units/ml Monitor platelets by anticoagulation protocol: Yes   Plan:  1) Increase heparin infusion to 1450 units/hr since heparin level at very low end of therapeutic range 2) Re-check heparin level 6 hours after rate change 3) Daily heparin level and CBC 4) Monitor closely for s/sx of bleeding    Greer Pickerel, PharmD, BCPS Pager: [phone] 09/16/2017 6:02 PM

## 2017-09-16 NOTE — Progress Notes (Signed)
  Echocardiogram 2D Echocardiogram has been performed. BY [facility] [place]  Janalyn Harder 09/16/2017, 4:19 PM

## 2017-09-17 DIAGNOSIS — I82812 Embolism and thrombosis of superficial veins of left lower extremities: Secondary | ICD-10-CM

## 2017-09-17 DIAGNOSIS — I2609 Other pulmonary embolism with acute cor pulmonale: Principal | ICD-10-CM

## 2017-09-17 DIAGNOSIS — E118 Type 2 diabetes mellitus with unspecified complications: Secondary | ICD-10-CM

## 2017-09-17 DIAGNOSIS — F39 Unspecified mood [affective] disorder: Secondary | ICD-10-CM

## 2017-09-17 DIAGNOSIS — I2699 Other pulmonary embolism without acute cor pulmonale: Secondary | ICD-10-CM

## 2017-09-17 DIAGNOSIS — G4730 Sleep apnea, unspecified: Secondary | ICD-10-CM

## 2017-09-17 DIAGNOSIS — I712 Thoracic aortic aneurysm, without rupture: Secondary | ICD-10-CM

## 2017-09-17 LAB — CBC
HCT: 37.1 % (ref 36.0–46.0)
Hemoglobin: 11.6 g/dL — ABNORMAL LOW (ref 12.0–15.0)
MCH: 28.9 pg (ref 26.0–34.0)
MCHC: 31.3 g/dL (ref 30.0–36.0)
MCV: 92.5 fL (ref 78.0–100.0)
Platelets: 201 10*3/uL (ref 150–400)
RBC: 4.01 MIL/uL (ref 3.87–5.11)
RDW: 14.3 % (ref 11.5–15.5)
WBC: 6.6 10*3/uL (ref 4.0–10.5)

## 2017-09-17 LAB — HEPARIN LEVEL (UNFRACTIONATED)
HEPARIN UNFRACTIONATED: 0.42 [IU]/mL (ref 0.30–0.70)
HEPARIN UNFRACTIONATED: 0.42 [IU]/mL (ref 0.30–0.70)

## 2017-09-17 LAB — GLUCOSE, CAPILLARY
GLUCOSE-CAPILLARY: 99 mg/dL (ref 65–99)
GLUCOSE-CAPILLARY: 100 mg/dL — AB (ref 65–99)

## 2017-09-17 LAB — TROPONIN I: Troponin I: <0.03 ng/mL (ref <0.03)

## 2017-09-17 MED ORDER — RIVAROXABAN 15 MG PO TABS
15.0000 mg | ORAL_TABLET | Freq: Two times a day (BID) | ORAL | Status: DC
  Administered 2017-09-17: 15 mg via ORAL
  Filled 2017-09-17: qty 1

## 2017-09-17 MED ORDER — RIVAROXABAN (XARELTO) VTE STARTER PACK (15 & 20 MG): ORAL_TABLET | ORAL | 0 refills | Status: DC

## 2017-09-17 NOTE — Progress Notes (Signed)
Pt O2 while ambulating maintained at 97% or higher.

## 2017-09-17 NOTE — Progress Notes (Deleted)
ANTICOAGULATION CONSULT NOTE   Pharmacy Consult for heparin Indication: acute pulmonary embolus  Allergies  Allergen Reactions  . Sulfa Antibiotics Hives    Patient Measurements: Height:  (165.1 cm) Weight: 299 lb 12.8 oz (136 kg) IBW/kg (Calculated) : 57 Heparin Dosing Weight: 90.7 kgTemp: 97.8 F (36.6 C) (05/09 0434) Temp Source: Oral (05/09 0434) BP: 106/68 (05/09 0434) Pulse Rate: 50 (05/09 0434)  Labs: Recent Labs    09/15/17 2003 09/16/17 0249  09/16/17 1657 09/17/17 0003 09/17/17 0747  HGB 12.4 11.4*  --   --  11.6*  --   HCT 39.4 36.1  --   --  37.1  --   PLT 247 208  --   --  201  --   HEPARINUNFRC  --  0.51   < > 0.31 0.42 0.42  CREATININE 0.93  --   --   --   --   --   TROPONINI <0.03  --   --   --   --   --    < > = values in this interval not displayed.    Estimated Creatinine Clearance: 97.8 mL/min (by C-G formula based on SCr of 0.93 mg/dL).  Assessment: 52 y/o F presented to the ED from PCP office on 09/15/17 for workup to r/o PE. CTa of chest showed acute PE with evidence of right heart strain (at least submassive PE). Pharmacy consulted to start heparin infusion. Patient not on anticoagulants PTA.  Today, 09/17/17  Heparin level  = 0.42 units/mL is therapeutic on heparin at 1450 units/hr  Hgb: 11.6/stable, Pltc WNL  No reported bleeding or infusion issues  Goal of Therapy:  Heparin level 0.3-0.7 units/ml Monitor platelets by anticoagulation protocol: Yes   Plan:   continue heparin infusion at 1450 units/hr since heparin level at very low end of therapeutic range  Daily heparin level and CBC  Monitor closely for s/sx of bleeding  Plan to change to DOAC, awaiting copay info  Juliette Alcide, PharmD, BCPS.   Pager: [phone] 09/17/2017 8:26 AM

## 2017-09-17 NOTE — Care Management Note (Signed)
Case Management Note  Patient Details  Name: Erika Woods MRN: [id number] Date of Birth: 1965/11/24  Subjective/Objective:Per [facility]-patient insurance-checked benefit for xarelto/eliquis-co pay for both is $50(patient states she can afford)-she is concerned about taking pristiq,& klonopine-MD-will use xarelto. No further CM needs.                    Action/Plan:d/c home.   Expected Discharge Date:                  Expected Discharge Plan:  Home/Self Care  In-House Referral:     Discharge planning Services  CM Consult  Post Acute Care Choice:    Choice offered to:     DME Arranged:    DME Agency:     HH Arranged:    HH Agency:     Status of Service:  In process, will continue to follow  If discussed at Long Length of Stay Meetings, dates discussed:    Additional Comments:  Lanier Clam, RN 09/17/2017, 12:37 PM

## 2017-09-17 NOTE — Progress Notes (Signed)
ANTICOAGULATION CONSULT NOTE   Pharmacy Consult for heparin to rivaroxaban Indication: acute pulmonary embolus  Allergies  Allergen Reactions  . Sulfa Antibiotics Hives    Patient Measurements: height 65 inches, weight 131 kg Height:  (165.1 cm) Weight: 299 lb 12.8 oz (136 kg) IBW/kg (Calculated) : 57 Heparin Dosing Weight: 89 kg  Vital Signs: Temp: 97.8 F (36.6 C) (05/09 0434) Temp Source: Oral (05/09 0434) BP: 106/68 (05/09 0434) Pulse Rate: 50 (05/09 0434)  Labs: Recent Labs    09/15/17 2003 09/16/17 0249  09/16/17 1657 09/17/17 0003 09/17/17 0747 09/17/17 1013  HGB 12.4 11.4*  --   --  11.6*  --   --   HCT 39.4 36.1  --   --  37.1  --   --   PLT 247 208  --   --  201  --   --   HEPARINUNFRC  --  0.51   < > 0.31 0.42 0.42  --   CREATININE 0.93  --   --   --   --   --   --   TROPONINI <0.03  --   --   --   --   --  <0.03   < > = values in this interval not displayed.    Estimated Creatinine Clearance: 97.8 mL/min (by C-G formula based on SCr of 0.93 mg/dL).  Assessment: Patient is a 53 y.o F presented to the ED from PCP office on 09/15/17 for workup to r/o PE. Chest CTA showed acute PE with evidence of right heart strain (submassive PE). Heparin drip started for PE.  Today, 09/17/2017: - heparin level is therapeutic at 0.42 - cbc stable - no bleeding documented  09/17/2017 1:25 PM Update: plans to change to xarelto for discharge. $50 Copay   Goal of Therapy:  Heparin level 0.3-0.7 units/ml Monitor platelets by anticoagulation protocol: Yes   Plan:  - Give xarelto  PO x 1 and turn off heparin gtt at same time - provide coupon for 30day free supply - educate patient  Juliette Alcide, PharmD, BCPS.   Pager: [phone] 09/17/2017 1:26 PM

## 2017-09-17 NOTE — Discharge Instructions (Signed)
Information on my medicine - XARELTO (rivaroxaban)  This medication education was reviewed with me or my healthcare representative as part of my discharge preparation.  The pharmacist that spoke with me during my hospital stay was:  Ulyses Amor. PharmD  WHY WAS XARELTO PRESCRIBED FOR YOU? Xarelto was prescribed to treat blood clots that may have been found in the veins of your legs (deep vein thrombosis) or in your lungs (pulmonary embolism) and to reduce the risk of them occurring again.  What do you need to know about Xarelto? The starting dose is one 15 mg tablet taken TWICE daily with food for the FIRST 21 DAYS then on Friday, May 31st  the dose is changed to one 20 mg tablet taken ONCE A DAY with your evening meal.  DO NOT stop taking Xarelto without talking to the health care provider who prescribed the medication.  Refill your prescription for 20 mg tablets before you run out.  After discharge, you should have regular check-up appointments with your healthcare provider that is prescribing your Xarelto.  In the future your dose may need to be changed if your kidney function changes by a significant amount.  What do you do if you miss a dose? If you are taking Xarelto TWICE DAILY and you miss a dose, take it as soon as you remember. You may take two 15 mg tablets (total 30 mg) at the same time then resume your regularly scheduled 15 mg twice daily the next day.  If you are taking Xarelto ONCE DAILY and you miss a dose, take it as soon as you remember on the same day then continue your regularly scheduled once daily regimen the next day. Do not take two doses of Xarelto at the same time.   Important Safety Information Xarelto is a blood thinner medicine that can cause bleeding. You should call your healthcare provider right away if you experience any of the following: ? Bleeding from an injury or your nose that does not stop. ? Unusual colored urine (red or dark brown) or unusual  colored stools (red or black). ? Unusual bruising for unknown reasons. ? A serious fall or if you hit your head (even if there is no bleeding).  Some medicines may interact with Xarelto and might increase your risk of bleeding while on Xarelto. To help avoid this, consult your healthcare provider or pharmacist prior to using any new prescription or non-prescription medications, including herbals, vitamins, non-steroidal anti-inflammatory drugs (NSAIDs) and supplements.  This website has more information on Xarelto: [email or web address].com.br.

## 2017-09-17 NOTE — Discharge Summary (Signed)
Physician Discharge Summary  BRAILYN KILLION ZOX:[id number] DOB: Jun 28, 1965 DOA: 09/15/2017  PCP: Dois Davenport, MD  Admit date: 09/15/2017 Discharge date: 09/17/2017  Admitted From: Home Disposition: Home  Recommendations for Outpatient Follow-up:  1. Follow up with PCP in 1-2 weeks 2. Follow up on 4 cm Thoracic Aortic Aneurysm annually with a CTA or MRA  3. Please obtain BMP/CBC in one week 4. Please follow up on the following pending results:  Home Health: No Equipment/Devices: None  Discharge Condition: Stable  CODE STATUS: FULL CODE Diet recommendation:   Brief/Interim Summary: The patient is a 52 year old morbidly obese female with history of type 2 diabetes mellitus, OSA on CPAP and anxiety who was sent to the ED by PCP with increasing shortness of breath and chest tightness for past 2 days.  This was also associated with pain in her left inner thigh.  Patient reports having a right knee arthroscopy done for loose cartilage about 2 months back.  She was found to have a meniscus tear that was repaired.  When seen by her PCP 2 days back when she was found to be short of breath and had chest tightness.  She received some breathing treatment and symptoms got better and sent home. She had blood work done including a d-dimer which was elevated. She was then sent for a lower extremity Doppler ultrasound which was positive for a superficial occlusive clot in the greater saphenous vein in the left thigh.  She was then sent for a CT angiogram of the chest which was positive for acute pulmonary embolus with signs of right heart strain.  Patient was admitted for IV anticoagulation and further monitoring and transitioned to NOAC. ECHOCardiogram was done and showed no significant RV Strain. She was ambulated and did not desaturate.  She is deemed medically stable to be discharged home at this time after being transitioned to a NOAC she will need to follow-up with PCP with 1-2 weeks of  Discharge.  Discharge Diagnoses:  Principal Problem:   Pulmonary emboli ([name]) Active Problems:   DM (diabetes mellitus) ([name])   Sleep apnea   Mood disorder ([name])   Obesity, Class III, BMI 40-49.9 (morbid obesity) ([name])   Acute pulmonary embolism with acute cor pulmonale ([name])   Acute superficial venous thrombosis of left lower extremity   Thoracic aortic aneurysm without rupture ([name])  Acute pulmonary Emboli   -Likely contributed by left leg DVT following right knee procedure 2 months back, with limited mobility for a short duration thereafter. -Patient is hemodynamically stable and no issues on cardiac monitor. -PESI Class 1 -Started on IV heparin drip and transitioned to po Xarelto.   -2D echo ordered to evaluate right heart strain and there was no Significant RV Strain -Given the clot burden and symptoms on presentation she was monitored her overnight and 2D echo was stable and no hemodynamic compromise she will be D/C'd home and follow up with PCP -Given the cause of her PE to be from recent orthopedic procedure and the size of the clot burden she should be treated for at least 6 months. -Given Xarelto Starter Pack at D/C   DM (diabetes mellitus) type II, controlled ([name]) -On Metformin at home which is to be resumed at D/C.   -Stable on sliding scale coverage during hospitalization -Follow up with PCP at D/C  Obstructive sleep apnea  -C/w CPAP at night  Mood disorder ([name])  -C/w Home Pristiq 100 mg po Daily, Home Clonazepam and Alprazolam, and Ropinirole  Obesity, Class III, BMI 40-49.9 (morbid obesity) ([name]) -Counseled on diet and exercise to lose weight.  Ascending Aortic Aneurysm -Incidental finding on CT chest of a 4 cm ascending thoracic aortic aneurysm.   -Based on guidelines recommend annual imaging follow-up by a CT angiogram or MRA.  The findings were discussed with the patient and she will need PCP to follow   Hx of Nephrolithathisis  -Stable and no  active Issue  Discharge Instructions  Discharge Instructions    Call MD for:  difficulty breathing, headache or visual disturbances   Complete by:  As directed    Call MD for:  extreme fatigue   Complete by:  As directed    Call MD for:  hives   Complete by:  As directed    Call MD for:  persistant dizziness or light-headedness   Complete by:  As directed    Call MD for:  persistant nausea and vomiting   Complete by:  As directed    Call MD for:  redness, tenderness, or signs of infection (pain, swelling, redness, odor or green/yellow discharge around incision site)   Complete by:  As directed    Call MD for:  severe uncontrolled pain   Complete by:  As directed    Call MD for:  temperature >100.4   Complete by:  As directed    Diet - low sodium heart healthy   Complete by:  As directed    Diet Carb Modified   Complete by:  As directed    Discharge instructions   Complete by:  As directed    Follow-up with primary care physician in 1 to 2 weeks and take all medications as prescribed.  If symptoms change or worsen please return to the emergency room for evaluation.   Increase activity slowly   Complete by:  As directed      Allergies as of 09/17/2017      Reactions   Sulfa Antibiotics Hives      Medication List    STOP taking these medications   diclofenac 50 MG tablet Commonly known as:  CATAFLAM   diclofenac 75 MG EC tablet Commonly known as:  VOLTAREN     TAKE these medications   ALPRAZolam 0.5 MG tablet Commonly known as:  XANAX Take 0.5 mg by mouth 2 (two) times daily as needed for anxiety.   clonazePAM 1 MG tablet Commonly known as:  KLONOPIN Take 1 mg by mouth 2 (two) times daily as needed for anxiety.   ibuprofen 200 MG tablet Commonly known as:  ADVIL,MOTRIN Take 200 mg by mouth every 6 (six) hours as needed for pain.   Melatonin 5 MG Tabs Take 10 mg by mouth at bedtime.   metFORMIN 500 MG tablet Commonly known as:  GLUCOPHAGE Take 500 mg by  mouth 2 (two) times daily with a meal.   PRISTIQ 100 MG 24 hr tablet Generic drug:  desvenlafaxine Take 100 mg by mouth daily.   Rivaroxaban 15 & 20 MG Tbpk Take as directed on package: Start with one 15mg  tablet by mouth twice a day with food. On Day 22, switch to one 20mg  tablet once a day with food.   rOPINIRole 4 MG 24 hr tablet Commonly known as:  REQUIP XL Take 4 mg by mouth daily.   TRELEGY ELLIPTA 100-62.5-25 MCG/INH Aepb Generic drug:  Fluticasone-Umeclidin-Vilant Inhale 1 puff into the lungs daily as needed (wheezing.).      Follow-up Information    Dois Davenport,  MD. Call.   Specialty:  Family Medicine Why:  Follow up within 1 week  Contact information: [address] [place] [address] [phone]          Allergies  Allergen Reactions  . Sulfa Antibiotics Hives   Consultations:  None  Procedures/Studies: Ct Angio Chest Pe W Or Wo Contrast  Addendum Date: 09/15/2017   ADDENDUM REPORT: 09/15/2017 17:58 ADDENDUM: Critical Value/emergent results were called by telephone at the time of interpretation on 09/15/2017 at 5:57 pm to Dr. Nadyne Coombes , who verbally acknowledged these results. Electronically Signed   By: Lupita Raider, M.D.   On: 09/15/2017 17:58   Result Date: 09/15/2017 CLINICAL DATA:  Shortness of breath, elevated D-dimer level. EXAM: CT ANGIOGRAPHY CHEST WITH CONTRAST TECHNIQUE: Multidetector CT imaging of the chest was performed using the standard protocol during bolus administration of intravenous contrast. Multiplanar CT image reconstructions and MIPs were obtained to evaluate the vascular anatomy. CONTRAST:  75mL ISOVUE-370 IOPAMIDOL (ISOVUE-370) INJECTION 76% COMPARISON:  Radiographs of March 19, 2009. FINDINGS: Cardiovascular: Linear filling defect is seen in right main pulmonary artery and lower lobe branches consistent with acute pulmonary embolus. RV/LV ratio of 1 is noted suggesting right heart strain. 4.0  cm ascending thoracic aortic aneurysm is noted. No dissection is noted. Mediastinum/Nodes: No enlarged mediastinal, hilar, or axillary lymph nodes. Thyroid gland, trachea, and esophagus demonstrate no significant findings. Lungs/Pleura: Lungs are clear. No pleural effusion or pneumothorax. Upper Abdomen: Fatty infiltration of the liver is noted. No other abnormality seen in visualized portion of upper abdomen. Musculoskeletal: No chest wall abnormality. No acute or significant osseous findings. Review of the MIP images confirms the above findings. IMPRESSION: Acute pulmonary embolus is noted in right main pulmonary artery and lower lobe branches. Positive for acute PE with CT evidence of right heart strain (RV/LV Ratio = 1.0) consistent with at least submassive (intermediate risk) PE. The presence of right heart strain has been associated with an increased risk of morbidity and mortality. Please activate Code PE by paging [phone]. 4.0 cm ascending thoracic aortic aneurysm is noted. Recommend annual imaging followup by CTA or MRA. This recommendation follows 2010 ACCF/AHA/AATS/ACR/ASA/SCA/SCAI/SIR/STS/SVM Guidelines for the Diagnosis and Management of Patients with Thoracic Aortic Disease. Circulation. 2010; 121: M841-L244. Fatty infiltration of the liver. Electronically Signed: By: Lupita Raider, M.D. On: 09/15/2017 17:44   ECHOCARDIOGRAM ------------------------------------------------------------------- Study Conclusions  - Left ventricle: The cavity size was normal. Wall thickness was   normal. Systolic function was vigorous. The estimated ejection   fraction was in the range of 65% to 70%. Wall motion was normal;   there were no regional wall motion abnormalities. Doppler   parameters are consistent with abnormal left ventricular   relaxation (grade 1 diastolic dysfunction). The E/e&' ratio is   between 8-15, suggesting indeterminate LV filling pressure. - Aortic valve: Trileaflet. Mildly  calcified elevated gradient but   no significant stenosis. There was mild regurgitation. Mean   gradient (S): 12 mm Hg. Peak gradient (S): 25 mm Hg. Valve area   (VTI): 2.68 cm^2. Valve area (Vmax): 2.48 cm^2. Valve area   (Vmean): 2.26 cm^2. - Aorta: Ascending aortic diameter: 41 mm (S). - Ascending aorta: The ascending aorta was mildly dilated. - Left atrium: The atrium was normal in size. - Right ventricle: The cavity size was normal. Wall thickness was   normal. The moderator band was prominent. - Inferior vena cava: The vessel was dilated. The respirophasic   diameter changes were  blunted (< 50%), consistent with elevated   central venous pressure.  Impressions:  - LVEF 65-70%, normal wall thickness, normal wall motion, grade 1   DD, indeterminate LV filling pressure, mildly elevated aortic   gradient without stenosis, mild regurgitation, dilated aorta to   4.1 cm, normal LA size, dilated IVC. No significant RV strain.  Subjective: Seen and examined felt well.  Denies shortness of breath nausea or vomiting.  Denies any chest pain at this time ambulated the halls and did not desaturate and is ready to go home.  Discharge Exam: Vitals:   09/16/17 2135 09/17/17 0434  BP: 111/70 106/68  Pulse: 64 (!) 50  Resp: 18 18  Temp: 98 F (36.7 C) 97.8 F (36.6 C)  SpO2: 98% 98%   Vitals:   09/16/17 0030 09/16/17 0155 09/16/17 2135 09/17/17 0434  BP: 116/72 135/73 111/70 106/68  Pulse: (!) 59 (!) 59 64 (!) 50  Resp: Temp:  (!) 97.3 F (36.3 C) 98 F (36.7 C) 97.8 F (36.6 C)  TempSrc:  Oral Oral Oral  SpO2: 97% 99% 98% 98%  Weight:  136 kg (299 lb 12.8 oz)    Height:       General: Pt is a morbidly obese Caucasian female who is alert, awake, not in acute distress Cardiovascular: RRR, S1/S2 +, no rubs, no gallops Respiratory: Diminished bilaterally, no wheezing, no rhonchi Abdominal: Soft, NT, Distended due to body habitus, bowel sounds + Extremities: no  edema, no cyanosis  The results of significant diagnostics from this hospitalization (including imaging, microbiology, ancillary and laboratory) are listed below for reference.    Microbiology: No results found for this or any previous visit (from the past 240 hour(s)).   Labs: BNP (last 3 results) Recent Labs    09/15/17 2003  BNP 34.2   Basic Metabolic Panel: Recent Labs  Lab 09/15/17 2003  NA 143  K 4.0  CL 108  CO2 25  GLUCOSE 113*  BUN 16  CREATININE 0.93  CALCIUM 9.2   Liver Function Tests: No results for input(s): AST, ALT, ALKPHOS, BILITOT, PROT, ALBUMIN in the last 168 hours. No results for input(s): LIPASE, AMYLASE in the last 168 hours. No results for input(s): AMMONIA in the last 168 hours. CBC: Recent Labs  Lab 09/15/17 2003 09/16/17 0249 09/17/17 0003  WBC 7.8 7.6 6.6  NEUTROABS 5.0  --   --   HGB 12.4 11.4* 11.6*  HCT 39.4 36.1 37.1  MCV 91.6 92.3 92.5  PLT 247 208 201   Cardiac Enzymes: Recent Labs  Lab 09/15/17 2003 09/17/17 1013  TROPONINI <0.03 <0.03   BNP: Invalid input(s): POCBNP CBG: Recent Labs  Lab 09/16/17 1140 09/16/17 1615 09/16/17 2132 09/17/17 0742 09/17/17 1127  GLUCAP 115* 133* 116* 99 100*   D-Dimer No results for input(s): DDIMER in the last 72 hours. Hgb A1c No results for input(s): HGBA1C in the last 72 hours. Lipid Profile No results for input(s): CHOL, HDL, LDLCALC, TRIG, CHOLHDL, LDLDIRECT in the last 72 hours. Thyroid function studies No results for input(s): TSH, T4TOTAL, T3FREE, THYROIDAB in the last 72 hours.  Invalid input(s): FREET3 Anemia work up No results for input(s): VITAMINB12, FOLATE, FERRITIN, TIBC, IRON, RETICCTPCT in the last 72 hours. Urinalysis No results found for: COLORURINE, APPEARANCEUR, LABSPEC, PHURINE, GLUCOSEU, HGBUR, BILIRUBINUR, KETONESUR, PROTEINUR, UROBILINOGEN, NITRITE, LEUKOCYTESUR Sepsis Labs Invalid input(s): PROCALCITONIN,  WBC,  LACTICIDVEN Microbiology No results  found for this or any previous visit (from the past  240 hour(s)).  Time coordinating discharge: 35 minutes  SIGNED:  Merlene Laughter, DO [facility] 09/17/2017, 1:49 PM Pager [phone]  If 7PM-7AM, please contact night-coverage [email or web address] Password [email or web address]

## 2017-09-17 NOTE — Progress Notes (Signed)
ANTICOAGULATION CONSULT NOTE - Follow Up Consult  Pharmacy Consult for Heparin Indication: Acute PE  Allergies  Allergen Reactions  . Sulfa Antibiotics Hives    Patient Measurements: Height:  (165.1 cm) Weight: 299 lb 12.8 oz (136 kg) IBW/kg (Calculated) : 57 Heparin Dosing Weight:   Vital Signs: Temp: 97.8 F (36.6 C) (05/09 0434) Temp Source: Oral (05/09 0434) BP: 106/68 (05/09 0434) Pulse Rate: 50 (05/09 0434)  Labs: Recent Labs    09/15/17 2003  09/16/17 0249 09/16/17 0935 09/16/17 1657 09/17/17 0003  HGB 12.4  --  11.4*  --   --  11.6*  HCT 39.4  --  36.1  --   --  37.1  PLT 247  --  208  --   --  201  HEPARINUNFRC  --    < > 0.51 0.80* 0.31 0.42  CREATININE 0.93  --   --   --   --   --   TROPONINI <0.03  --   --   --   --   --    < > = values in this interval not displayed.    Estimated Creatinine Clearance: 97.8 mL/min (by C-G formula based on SCr of 0.93 mg/dL).   Medications:  Infusions:  . heparin 1,450 Units/hr (09/17/17 0121)    Assessment: Patient with heparin level at goal.  No heparin issues noted.  Goal of Therapy:  Heparin level 0.3-0.7 units/ml Monitor platelets by anticoagulation protocol: Yes   Plan:  Continue heparin drip at current rate Recheck level at 0800  Darlina Guys, Notus Crowford 09/17/2017,4:50 AM

## 2017-09-17 NOTE — Progress Notes (Signed)
PT Cancellation Note  Patient Details Name: Erika Woods MRN: [id number] DOB: 1966/01/31   Cancelled Treatment:    Reason Eval/Treat Not Completed: PT screened, no needs identified, will sign off   Rada Hay 09/17/2017, 2:14 PM  Blanchard Kelch PT [phone]

## 2017-09-17 NOTE — Progress Notes (Signed)
ANTICOAGULATION CONSULT NOTE   Pharmacy Consult for heparin Indication: acute pulmonary embolus  Allergies  Allergen Reactions  . Sulfa Antibiotics Hives    Patient Measurements: height 65 inches, weight 131 kg Height:  (165.1 cm) Weight: 299 lb 12.8 oz (136 kg) IBW/kg (Calculated) : 57 Heparin Dosing Weight: 89 kg  Vital Signs: Temp: 97.8 F (36.6 C) (05/09 0434) Temp Source: Oral (05/09 0434) BP: 106/68 (05/09 0434) Pulse Rate: 50 (05/09 0434)  Labs: Recent Labs    09/15/17 2003 09/16/17 0249  09/16/17 1657 09/17/17 0003 09/17/17 0747  HGB 12.4 11.4*  --   --  11.6*  --   HCT 39.4 36.1  --   --  37.1  --   PLT 247 208  --   --  201  --   HEPARINUNFRC  --  0.51   < > 0.31 0.42 0.42  CREATININE 0.93  --   --   --   --   --   TROPONINI <0.03  --   --   --   --   --    < > = values in this interval not displayed.    Estimated Creatinine Clearance: 97.8 mL/min (by C-G formula based on SCr of 0.93 mg/dL).  Assessment: Patient is a 52 y.o F presented to the ED from PCP office on 09/15/17 for workup to r/o PE. Chest CTA showed acute PE with evidence of right heart strain (submassive PE). Heparin drip started for PE.  Today, 09/17/2017: - heparin level is therapeutic at 0.42 - cbc stable - no bleeding documented  Goal of Therapy:  Heparin level 0.3-0.7 units/ml Monitor platelets by anticoagulation protocol: Yes   Plan:  - continue heparin drip at 1450 units/hr - daily heparin level - monitor for s/s bleeding  Sulo Janczak P 09/17/2017,10:31 AM

## 2017-11-10 ENCOUNTER — Emergency Department (HOSPITAL_COMMUNITY): Payer: BLUE CROSS/BLUE SHIELD

## 2017-11-10 ENCOUNTER — Emergency Department (HOSPITAL_COMMUNITY)
Admission: EM | Admit: 2017-11-10 | Discharge: 2017-11-10 | Disposition: A | Discharge status: Home / Self Care | Payer: BLUE CROSS/BLUE SHIELD | Attending: Emergency Medicine | Admitting: Emergency Medicine

## 2017-11-10 ENCOUNTER — Encounter (HOSPITAL_COMMUNITY): Payer: Self-pay

## 2017-11-10 ENCOUNTER — Other Ambulatory Visit: Payer: Self-pay

## 2017-11-10 DIAGNOSIS — Z7984 Long term (current) use of oral hypoglycemic drugs: Secondary | ICD-10-CM | POA: Insufficient documentation

## 2017-11-10 DIAGNOSIS — E119 Type 2 diabetes mellitus without complications: Secondary | ICD-10-CM | POA: Insufficient documentation

## 2017-11-10 DIAGNOSIS — R0789 Other chest pain: Secondary | ICD-10-CM | POA: Diagnosis not present

## 2017-11-10 DIAGNOSIS — N390 Urinary tract infection, site not specified: Secondary | ICD-10-CM | POA: Insufficient documentation

## 2017-11-10 DIAGNOSIS — R0600 Dyspnea, unspecified: Secondary | ICD-10-CM | POA: Insufficient documentation

## 2017-11-10 DIAGNOSIS — Z79899 Other long term (current) drug therapy: Secondary | ICD-10-CM | POA: Insufficient documentation

## 2017-11-10 DIAGNOSIS — R079 Chest pain, unspecified: Secondary | ICD-10-CM | POA: Diagnosis present

## 2017-11-10 DIAGNOSIS — Z7901 Long term (current) use of anticoagulants: Secondary | ICD-10-CM | POA: Insufficient documentation

## 2017-11-10 LAB — URINALYSIS, ROUTINE W REFLEX MICROSCOPIC
Bilirubin Urine: NEGATIVE
GLUCOSE, UA: NEGATIVE mg/dL
Hgb urine dipstick: NEGATIVE
Ketones, ur: NEGATIVE mg/dL
Nitrite: NEGATIVE
PH: 7 (ref 5.0–8.0)
Protein, ur: NEGATIVE mg/dL
SPECIFIC GRAVITY, URINE: 1.004 — AB (ref 1.005–1.030)

## 2017-11-10 LAB — COMPREHENSIVE METABOLIC PANEL
ALT: 33 U/L (ref 0–44)
AST: 28 U/L (ref 15–41)
Albumin: 4 g/dL (ref 3.5–5.0)
Alkaline Phosphatase: 85 U/L (ref 38–126)
Anion gap: 10 (ref 5–15)
BILIRUBIN TOTAL: 0.4 mg/dL (ref 0.3–1.2)
BUN: 13 mg/dL (ref 6–20)
CO2: 25 mmol/L (ref 22–32)
Calcium: 9.2 mg/dL (ref 8.9–10.3)
Chloride: 107 mmol/L (ref 98–111)
Creatinine, Ser: 1.01 mg/dL — ABNORMAL HIGH (ref 0.44–1.00)
Glucose, Bld: 96 mg/dL (ref 70–99)
Potassium: 4.4 mmol/L (ref 3.5–5.1)
Sodium: 142 mmol/L (ref 135–145)
TOTAL PROTEIN: 7.4 g/dL (ref 6.5–8.1)

## 2017-11-10 LAB — CBC WITH DIFFERENTIAL/PLATELET
BASOS ABS: 0 10*3/uL (ref 0.0–0.1)
Basophils Relative: 0 %
Eosinophils Absolute: 0.6 10*3/uL (ref 0.0–0.7)
Eosinophils Relative: 7 %
HEMATOCRIT: 42.4 % (ref 36.0–46.0)
Hemoglobin: 13.4 g/dL (ref 12.0–15.0)
LYMPHS ABS: 2.5 10*3/uL (ref 0.7–4.0)
LYMPHS PCT: 27 %
MCH: 29.1 pg (ref 26.0–34.0)
MCHC: 31.6 g/dL (ref 30.0–36.0)
MCV: 92.2 fL (ref 78.0–100.0)
MONO ABS: 0.6 10*3/uL (ref 0.1–1.0)
Monocytes Relative: 6 %
NEUTROS ABS: 5.6 10*3/uL (ref 1.7–7.7)
Neutrophils Relative %: 60 %
PLATELETS: 271 10*3/uL (ref 150–400)
RBC: 4.6 MIL/uL (ref 3.87–5.11)
RDW: 14.1 % (ref 11.5–15.5)
WBC: 9.3 10*3/uL (ref 4.0–10.5)

## 2017-11-10 LAB — BRAIN NATRIURETIC PEPTIDE: B NATRIURETIC PEPTIDE 5: 51 pg/mL (ref 0.0–100.0)

## 2017-11-10 LAB — TROPONIN I

## 2017-11-10 MED ORDER — CEPHALEXIN 500 MG PO CAPS: 500.0000 mg | ORAL_CAPSULE | Freq: Three times a day (TID) | ORAL | 0 refills | Status: DC

## 2017-11-10 MED ORDER — IOPAMIDOL (ISOVUE-370) INJECTION 76%
100.0000 mL | Freq: Once | INTRAVENOUS | Status: AC | PRN
  Administered 2017-11-10: 100 mL via INTRAVENOUS

## 2017-11-10 MED ORDER — CEPHALEXIN 500 MG PO CAPS
500.0000 mg | ORAL_CAPSULE | Freq: Once | ORAL | Status: AC
  Administered 2017-11-10: 500 mg via ORAL
  Filled 2017-11-10: qty 1

## 2017-11-10 MED ORDER — IOPAMIDOL (ISOVUE-370) INJECTION 76%
INTRAVENOUS | Status: AC
  Filled 2017-11-10: qty 100

## 2017-11-10 NOTE — ED Notes (Signed)
MD at bedside. 

## 2017-11-10 NOTE — ED Provider Notes (Signed)
[facility]-EMERGENCY DEPT Provider Note   CSN: [id number] Arrival date & time: 11/10/17  1745     History   Chief Complaint Chief Complaint  Patient presents with  . Shortness of Breath    HPI Erika Woods is a 52 y.o. female.  HPI Patient presents with 5 days of dyspnea with exertion and right-sided chest pain.  Pain does not radiate.  No cough, fever or chills.  She is had some lateral right lower leg pain but denies any new swelling.  States she is been compliant with Xarelto.  Recently admitted for DVT/PE. Past Medical History:  Diagnosis Date  . Diabetes mellitus without complication ([name])   . Obstructive sleep apnea on CPAP   . Renal disorder    kideny stones    Patient Active Problem List   Diagnosis Date Noted  . Obesity, Class III, BMI 40-49.9 (morbid obesity) ([name]) 09/16/2017  . Acute pulmonary embolism with acute cor pulmonale ([name]) 09/16/2017  . Acute superficial venous thrombosis of left lower extremity   . Thoracic aortic aneurysm without rupture ([name])   . Pulmonary emboli ([name]) 09/15/2017  . PTSD (post-traumatic stress disorder) 08/18/2011  . DM (diabetes mellitus) ([name]) 08/18/2011  . Sleep apnea 08/18/2011  . Mood disorder ([name]) 08/18/2011    Past Surgical History:  Procedure Laterality Date  . arm surgery Bilateral Skin removal  . DILATION AND EVACUATION    . KIDNEY STONE SURGERY    . TONSILLECTOMY       OB History   None      Home Medications    Prior to Admission medications   Medication Sig Start Date End Date Taking? Authorizing Provider  acetaminophen (TYLENOL) 325 MG tablet Take 650 mg by mouth every 6 (six) hours as needed for mild pain or headache.   Yes [provider]  ALPRAZolam Prudy Feeler(XANAX) 0.5 MG tablet Take 0.5 mg by mouth 2 (two) times daily as needed for anxiety.  06/26/15  Yes [provider]  Benzocaine-Zinc Chloride (KANK-A SOFTBRUSH) 20-0.1 % GEL Use as directed 1 application in the  mouth or throat 3 (three) times daily. Apply to ulcer on tip of tongue   Yes [provider]  clonazePAM (KLONOPIN) 1 MG tablet Take 1 mg by mouth 2 (two) times daily.    Yes [provider]  lamoTRIgine (LAMICTAL) 25 MG tablet Take 25 mg by mouth 2 (two) times daily. 10/13/17  Yes [provider]  Melatonin 5 MG TABS Take 10 mg by mouth at bedtime.   Yes [provider]  metFORMIN (GLUCOPHAGE) 500 MG tablet Take 500 mg by mouth 2 (two) times daily with a meal.  10/28/13  Yes [provider]  PRISTIQ 100 MG 24 hr tablet Take 100 mg by mouth daily.  08/06/15  Yes [provider]  rivaroxaban (XARELTO) 20 MG TABS tablet Take 20 mg by mouth daily.   Yes [provider]  rOPINIRole (REQUIP XL) 4 MG 24 hr tablet Take 4 mg by mouth daily. 08/18/17  Yes [provider]  cephALEXin (KEFLEX) 500 MG capsule Take 1 capsule (500 mg total) by mouth 3 (three) times daily. 11/11/17   Loren RacerYelverton, Mansel Strother, MD  Rivaroxaban 15 & 20 MG TBPK Take as directed on package: Start with one 15mg  tablet by mouth twice a day with food. On Day 22, switch to one 20mg  tablet once a day with food. Patient not taking: Reported on 11/10/2017 09/17/17   Merlene LaughterSheikh, Omair Latif, DO  Family History Family History  Adopted: Yes    Social History Social History   Tobacco Use  . Smoking status: Never Smoker  . Smokeless tobacco: Never Used  Substance Use Topics  . Alcohol use: Yes    Comment: special occassional - mixed drinks  . Drug use: No     Allergies   Sulfa antibiotics   Review of Systems Review of Systems  Constitutional: Positive for fatigue. Negative for chills and fever.  HENT: Negative for sore throat and trouble swallowing.   Eyes: Negative for visual disturbance.  Respiratory: Positive for shortness of breath. Negative for cough.   Cardiovascular: Positive for chest pain. Negative for palpitations and leg swelling.  Gastrointestinal: Negative for  abdominal pain, constipation, diarrhea, nausea and vomiting.  Genitourinary: Negative for dysuria, flank pain, frequency and hematuria.  Musculoskeletal: Positive for myalgias. Negative for back pain, neck pain and neck stiffness.  Skin: Negative for rash and wound.  Neurological: Negative for dizziness, weakness, light-headedness, numbness and headaches.  All other systems reviewed and are negative.    Physical Exam Updated Vital Signs BP 138/83 (BP Location: Left Arm)   Pulse 64   Temp 97.8 F (36.6 C) (Oral)   Resp 18   Ht 5\' 5"  (1.651 m)   Wt 133.8 kg (295 lb)   LMP 01/29/2013   SpO2 96%   BMI 49.09 kg/m   Physical Exam  Constitutional: She is oriented to person, place, and time. She appears well-developed and well-nourished. No distress.  HENT:  Head: Normocephalic and atraumatic.  Mouth/Throat: Oropharynx is clear and moist. No oropharyngeal exudate.  Eyes: Pupils are equal, round, and reactive to light. EOM are normal.  Neck: Normal range of motion. Neck supple. No JVD present.  Cardiovascular: Normal rate and regular rhythm. Exam reveals no gallop and no friction rub.  No murmur heard. Pulmonary/Chest: Effort normal and breath sounds normal. No stridor. No respiratory distress. She has no wheezes. She has no rales. She exhibits no tenderness.  Abdominal: Soft. Bowel sounds are normal. There is no tenderness. There is no rebound and no guarding.  Musculoskeletal: Normal range of motion. She exhibits tenderness. She exhibits no edema.  Mild tenderness over the lateral right calf.  No obvious swelling, deformity.  Distal pulses intact.  Lymphadenopathy:    She has no cervical adenopathy.  Neurological: She is alert and oriented to person, place, and time.  Moves all extremities without focal deficit.  Sensation fully intact.  Skin: Skin is warm and dry. Capillary refill takes less than 2 seconds. No rash noted. She is not diaphoretic. No erythema.  Psychiatric: She has a  normal mood and affect. Her behavior is normal.  Nursing note and vitals reviewed.    ED Treatments / Results  Labs (all labs ordered are listed, but only abnormal results are displayed) Labs Reviewed  COMPREHENSIVE METABOLIC PANEL - Abnormal; Notable for the following components:      Result Value   Creatinine, Ser 1.01 (*)    All other components within normal limits  URINALYSIS, ROUTINE W REFLEX MICROSCOPIC - Abnormal; Notable for the following components:   Color, Urine STRAW (*)    Specific Gravity, Urine 1.004 (*)    Leukocytes, UA MODERATE (*)    Bacteria, UA MANY (*)    All other components within normal limits  CBC WITH DIFFERENTIAL/PLATELET  BRAIN NATRIURETIC PEPTIDE  TROPONIN I    EKG EKG Interpretation  Date/Time:  Tuesday November 10 2017 17:55:58 EDT Ventricular Rate:  75 PR Interval:    QRS Duration: 132 QT Interval:  381 QTC Calculation: 426 R Axis:   -56 Text Interpretation:  Sinus rhythm RBBB and LAFB Confirmed by Loren Racer ([id number]) on 11/10/2017 7:40:47 PM Also confirmed by Loren Racer ([id number]), editor Barbette Hair [phone])  on 11/11/2017 7:02:22 AM   Radiology Dg Chest 2 View  Result Date: 11/10/2017 CLINICAL DATA:  Shortness of breath EXAM: CHEST - 2 VIEW COMPARISON:  03/19/2009 FINDINGS: The heart size and mediastinal contours are within normal limits. Both lungs are clear. Mild degenerative changes of the spine. IMPRESSION: No active cardiopulmonary disease. Electronically Signed   By: Jasmine Pang M.D.   On: 11/10/2017 18:24   Ct Angio Chest Pe W And/or Wo Contrast  Result Date: 11/10/2017 CLINICAL DATA:  Recent pulmonary embolus. Shortness of breath and chest pain EXAM: CT ANGIOGRAPHY CHEST WITH CONTRAST TECHNIQUE: Multidetector CT imaging of the chest was performed using the standard protocol during bolus administration of intravenous contrast. Multiplanar CT image reconstructions and MIPs were obtained to evaluate the vascular anatomy. CONTRAST:   ISOVUE-370 IOPAMIDOL (ISOVUE-370) INJECTION 76% COMPARISON:  CT angiogram chest Sep 15, 2017 and chest radiograph November 10, 2017 FINDINGS: Cardiovascular: The previously noted pulmonary emboli have resolved. On the current examination, there is no evident pulmonary embolus. Ascending thoracic aortic diameter measures 4.0 x 3.9 cm. There is no thoracic aortic dissection. Visualized great vessels appear normal. No pericardial effusion or pericardial thickening is evident. The main pulmonary outflow tract has a maximum transverse diameter 3.2 cm, enlarged. Mediastinum/Nodes: Thyroid appears unremarkable. There is no appreciable thoracic adenopathy. There is a small hiatal hernia. Lungs/Pleura: There is no evident edema or consolidation. There are areas of patchy atelectatic change bilaterally. No pleural effusion or pleural thickening evident. Upper Abdomen: There is hepatic steatosis. Visualized upper abdominal structures otherwise appear normal. Musculoskeletal: There is degenerative change in the thoracic spine. No evident blastic or lytic bone lesions. No chest wall lesions evident. Steatosis. Visualized upper abdominal structures otherwise appear unremarkable. Review of the MIP images confirms the above findings. IMPRESSION: 1. Interval resolution of pulmonary embolus compared to prior study. Currently no pulmonary embolus evident. 2. Ascending thoracic aorta measures 4.0 x 3.9 cm. No dissection evident. Recommend annual imaging followup by CTA or MRA. This recommendation follows 2010 ACCF/AHA/AATS/ACR/ASA/SCA/SCAI/SIR/STS/SVM Guidelines for the Diagnosis and Management of Patients with Thoracic Aortic Disease. Circulation. 2010; 121: Q657-Q469. 3. Prominence the main pulmonary outflow tract, a finding most likely indicative of a degree of pulmonary arterial hypertension. 4.  No lung edema or consolidation.  Patchy atelectasis bilaterally. 5.  No evident thoracic adenopathy. 6.  Small hiatal hernia. 7.   Hepatic steatosis. Electronically Signed   By: Bretta Bang III M.D.   On: 11/10/2017 20:27    Procedures Procedures (including critical care time)  Medications Ordered in ED Medications  iopamidol (ISOVUE-370) 76 % injection 100 mL (100 mLs Intravenous Contrast Given 11/10/17 1958)  cephALEXin (KEFLEX) capsule 500 mg (500 mg Oral Given 11/10/17 2106)     Initial Impression / Assessment and Plan / ED Course  I have reviewed the triage vital signs and the nursing notes.  Pertinent labs & imaging results that were available during my care of the patient were reviewed by me and considered in my medical decision making (see chart for details).     No evidence of PE on CT anterior chest.  Patient does have urinary tract infection.  Will start on antibiotics.  No ischemic findings and single  troponin sufficient to rule out MI.  Patient does have some evidence of pulmonary artery hypertension on her CT which may be contributing to her symptoms.  She is advised to follow-up with her primary physician and return precautions have been given.  Final Clinical Impressions(s) / ED Diagnoses   Final diagnoses:  Acute lower UTI  Dyspnea, unspecified type  Atypical chest pain    ED Discharge Orders        Ordered    cephALEXin (KEFLEX) 500 MG capsule  3 times daily     11/10/17 2055       Loren Racer, MD 11/12/17 1739

## 2017-11-10 NOTE — ED Notes (Signed)
Bed: WA17 Expected date:  Expected time:  Means of arrival:  Comments: tr1 

## 2017-11-10 NOTE — ED Triage Notes (Signed)
Pt is alert and oriented  C/o SOB x 5 days with minimal exertion with associated rt sided chest pain and rt side leg pain intermittently worsens when she is " winded" . Pt has HX of Pulmonary Embolism  in the past. Pt reports that she has been on blood thinners x 2 months. Non-labored breathing or wheezing is noted    EKG completed.

## 2017-12-10 NOTE — Progress Notes (Signed)
Chief Complaint  Patient presents with  . New Patient (Initial Visit)   History of Present Illness: 52 yo female with hstory of diabetes, sleep apnea on CPAP, prior pulmonary embolism and thoracic aortic aneurysm who is here today as a new consult, referred by Dr. Hal Hopeichter, for the evaluation of thoracic aortic aneurysm. [facility] with an acute left thigh superficial thrombosis and found to have a PE. She had a procedure on her knee in March 2019. Echo May 2019 with LVEF=65-70%. Mild AI, dilated aortic root. 4.0 cm ascending aortic aneurysm noted on CTA chest. Repeat chest CTA July 2019 with resolution of PE.   She tells me today that she has felt well overall. She has no chest pain or dyspnea. She has no LE edema. She is very active.   Primary Care Physician: Dois Davenportichter, Karen L, MD  Past Medical History:  Diagnosis Date  . Diabetes mellitus without complication ([name])   . Impaired fasting glucose   . Obstructive sleep apnea on CPAP   . Osteoarthritis of knee   . Personal history of pulmonary embolism   . Renal disorder    kideny stones  . Restless leg syndrome   . Thoracic aortic aneurysm [facility]([name])     Past Surgical History:  Procedure Laterality Date  . arm surgery Bilateral Skin removal  . DILATION AND EVACUATION    . KIDNEY STONE SURGERY    . KNEE SURGERY    . TONSILLECTOMY      Current Outpatient Medications  Medication Sig Dispense Refill  . acetaminophen (TYLENOL) 325 MG tablet Take 650 mg by mouth every 6 (six) hours as needed for mild pain or headache.    . ALPRAZolam (XANAX) 0.5 MG tablet Take 0.5 mg by mouth 2 (two) times daily as needed for anxiety.     . clonazePAM (KLONOPIN) 1 MG tablet Take 1 mg by mouth 2 (two) times daily.     Marland Kitchen. lamoTRIgine (LAMICTAL) 25 MG tablet Take 25 mg by mouth 2 (two) times daily.  0  . Melatonin 5 MG TABS Take 10 mg by mouth at bedtime.    . metFORMIN (GLUCOPHAGE) 500 MG tablet Take 500 mg by mouth 2 (two) times  daily with a meal.     . PRISTIQ 100 MG 24 hr tablet Take 100 mg by mouth daily.     . rivaroxaban (XARELTO) 20 MG TABS tablet Take 20 mg by mouth daily.    Marland Kitchen. rOPINIRole (REQUIP XL) 4 MG 24 hr tablet Take 4 mg by mouth daily.  3   No current facility-administered medications for this visit.     Allergies  Allergen Reactions  . Sulfa Antibiotics Hives    Social History   Socioeconomic History  . Marital status: Married    Spouse name: Not on file  . Number of children: 2  . Years of education: Not on file  . Highest education level: Not on file  Occupational History  . Occupation: Administratorre-school teacher  Social Needs  . Financial resource strain: Not on file  . Food insecurity:    Worry: Not on file    Inability: Not on file  . Transportation needs:    Medical: Not on file    Non-medical: Not on file  Tobacco Use  . Smoking status: Never Smoker  . Smokeless tobacco: Never Used  Substance and Sexual Activity  . Alcohol use: Yes    Comment: special occassional - mixed drinks  .  Drug use: No  . Sexual activity: Not on file  Lifestyle  . Physical activity:    Days per week: Not on file    Minutes per session: Not on file  . Stress: Not on file  Relationships  . Social connections:    Talks on phone: Not on file    Gets together: Not on file    Attends religious service: Not on file    Active member of club or organization: Not on file    Attends meetings of clubs or organizations: Not on file    Relationship status: Not on file  . Intimate partner violence:    Fear of current or ex partner: Not on file    Emotionally abused: Not on file    Physically abused: Not on file    Forced sexual activity: Not on file  Other Topics Concern  . Not on file  Social History Narrative  . Not on file    Family History  Adopted: Yes    Review of Systems:  As stated in the HPI and otherwise negative.   BP 118/80   Pulse 79   Ht 5\' 5"  (1.651 m)   Wt (!) 309 lb 12.8 oz  (140.5 kg)   LMP 01/29/2013   SpO2 98%   BMI 51.55 kg/m   Physical Examination:  General: Well developed, well nourished, NAD  HEENT: OP clear, mucus membranes moist  SKIN: warm, dry. No rashes. Neuro: No focal deficits  Musculoskeletal: Muscle strength 5/5 all ext  Psychiatric: Mood and affect normal  Neck: No JVD, no carotid bruits, no thyromegaly, no lymphadenopathy.  Lungs:Clear bilaterally, no wheezes, rhonci, crackles Cardiovascular: Regular rate and rhythm. No murmurs, gallops or rubs. Abdomen:Soft. Bowel sounds present. Non-tender.  Extremities: No lower extremity edema. Pulses are 2 + in the bilateral DP/PT.  EKG:  EKG is ordered today. The ekg ordered today demonstrates NSR, rate 75 bpm. RBBB  Recent Labs: 11/10/2017: ALT 33; B Natriuretic Peptide 51.0; BUN 13; Creatinine, Ser 1.01; Hemoglobin 13.4; Platelets 271; Potassium 4.4; Sodium 142   Lipid Panel    Component Value Date/Time   CHOL 175 08/18/2011 0923   TRIG 58 08/18/2011 0923   HDL 61 08/18/2011 0923   CHOLHDL 2.9 08/18/2011 0923   VLDL 12 08/18/2011 0923   LDLCALC 102 (H) 08/18/2011 0923     Wt Readings from Last 3 Encounters:  12/11/17 (!) 309 lb 12.8 oz (140.5 kg)  11/10/17 295 lb (133.8 kg)  09/16/17 299 lb 12.8 oz (136 kg)     Other studies Reviewed: Additional studies/ records that were reviewed today include:  Review of the above records demonstrates:    Assessment and Plan:   1. Thoracic aortic aneurysm: 4.0 cm by chest CTA July 2019. Will repeat in 6 months.   2. Aortic insufficiency: Mild by echo may 2019. Likely due to her thoracic aortic dilatation. Repeat echo in 2 years.   Current medicines are reviewed at length with the patient today.  The patient does not have concerns regarding medicines.  The following changes have been made:   Labs/ tests ordered today include:   Orders Placed This Encounter  Procedures  . CT ANGIO CHEST AORTA W &/OR WO CONTRAST  . Basic Metabolic  Panel (BMET)  . EKG 12-Lead     Disposition:   FU with me in 12 months   Signed, Verne Carrow, MD 12/11/2017 10:28 AM    [facility] [address] [place],  [place], [place]  [address] Phone: [phone]; Fax: [phone]

## 2017-12-11 ENCOUNTER — Ambulatory Visit: Payer: BLUE CROSS/BLUE SHIELD | Admitting: Cardiovascular Disease

## 2017-12-11 ENCOUNTER — Encounter: Payer: Self-pay | Admitting: Cardiovascular Disease

## 2017-12-11 ENCOUNTER — Encounter

## 2017-12-11 VITALS — BP 118/80 | HR 79 | Ht 65.0 in | Wt 309.8 lb

## 2017-12-11 DIAGNOSIS — I712 Thoracic aortic aneurysm, without rupture, unspecified: Secondary | ICD-10-CM

## 2017-12-11 DIAGNOSIS — I351 Nonrheumatic aortic (valve) insufficiency: Secondary | ICD-10-CM | POA: Diagnosis not present

## 2017-12-11 NOTE — Patient Instructions (Signed)
Medication Instructions:  Your physician recommends that you continue on your current medications as directed. Please refer to the Current Medication list given to you today.   Labwork: Your physician recommends that you return for lab work in: January (prior to CTA)--BMP   Testing/Procedures: Non-Cardiac CT scanning, (CAT scanning), is a noninvasive, special x-ray that produces cross-sectional images of the body using x-rays and a computer. CT scans help physicians diagnose and treat medical conditions. For some CT exams, a contrast material is used to enhance visibility in the area of the body being studied. CT scans provide greater clarity and reveal more details than regular x-ray exams. To be done in January 2020   Follow-Up: Your physician wants you to follow-up in: 9 months.  You will receive a reminder letter in the mail two months in advance. If you don't receive a letter, please call our office to schedule the follow-up appointment.   Any Other Special Instructions Will Be Listed Below (If Applicable).     If you need a refill on your cardiac medications before your next appointment, please call your pharmacy.

## 2018-01-19 ENCOUNTER — Encounter (HOSPITAL_COMMUNITY): Payer: Self-pay | Admitting: Emergency Medicine

## 2018-01-19 ENCOUNTER — Emergency Department (HOSPITAL_COMMUNITY)
Admission: EM | Admit: 2018-01-19 | Discharge: 2018-01-20 | Disposition: A | Discharge status: Home / Self Care | Payer: BLUE CROSS/BLUE SHIELD | Attending: Emergency Medicine | Admitting: Emergency Medicine

## 2018-01-19 ENCOUNTER — Other Ambulatory Visit: Payer: Self-pay

## 2018-01-19 DIAGNOSIS — T50901A Poisoning by unspecified drugs, medicaments and biological substances, accidental (unintentional), initial encounter: Secondary | ICD-10-CM

## 2018-01-19 DIAGNOSIS — E119 Type 2 diabetes mellitus without complications: Secondary | ICD-10-CM | POA: Insufficient documentation

## 2018-01-19 DIAGNOSIS — Z79899 Other long term (current) drug therapy: Secondary | ICD-10-CM | POA: Diagnosis not present

## 2018-01-19 DIAGNOSIS — F332 Major depressive disorder, recurrent severe without psychotic features: Secondary | ICD-10-CM | POA: Diagnosis not present

## 2018-01-19 DIAGNOSIS — T424X1A Poisoning by benzodiazepines, accidental (unintentional), initial encounter: Secondary | ICD-10-CM | POA: Insufficient documentation

## 2018-01-19 DIAGNOSIS — Z7984 Long term (current) use of oral hypoglycemic drugs: Secondary | ICD-10-CM | POA: Insufficient documentation

## 2018-01-19 DIAGNOSIS — Z7901 Long term (current) use of anticoagulants: Secondary | ICD-10-CM | POA: Diagnosis not present

## 2018-01-19 DIAGNOSIS — F4310 Post-traumatic stress disorder, unspecified: Secondary | ICD-10-CM | POA: Diagnosis present

## 2018-01-19 LAB — COMPREHENSIVE METABOLIC PANEL
ALK PHOS: 74 U/L (ref 38–126)
ALT: 35 U/L (ref 0–44)
AST: 30 U/L (ref 15–41)
Albumin: 3.8 g/dL (ref 3.5–5.0)
Anion gap: 10 (ref 5–15)
BILIRUBIN TOTAL: 0.4 mg/dL (ref 0.3–1.2)
BUN: 15 mg/dL (ref 6–20)
CO2: 26 mmol/L (ref 22–32)
CREATININE: 1.05 mg/dL — AB (ref 0.44–1.00)
Calcium: 9.1 mg/dL (ref 8.9–10.3)
Chloride: 105 mmol/L (ref 98–111)
GFR calc non Af Amer: 60 mL/min — ABNORMAL LOW (ref >60)
GLUCOSE: 101 mg/dL — AB (ref 70–99)
Potassium: 4.4 mmol/L (ref 3.5–5.1)
SODIUM: 141 mmol/L (ref 135–145)
Total Protein: 7 g/dL (ref 6.5–8.1)

## 2018-01-19 LAB — CBC
HEMATOCRIT: 37.6 % (ref 36.0–46.0)
HEMOGLOBIN: 12 g/dL (ref 12.0–15.0)
MCH: 29.3 pg (ref 26.0–34.0)
MCHC: 31.9 g/dL (ref 30.0–36.0)
MCV: 91.7 fL (ref 78.0–100.0)
Platelets: 216 10*3/uL (ref 150–400)
RBC: 4.1 MIL/uL (ref 3.87–5.11)
RDW: 14.9 % (ref 11.5–15.5)
WBC: 8.2 10*3/uL (ref 4.0–10.5)

## 2018-01-19 LAB — RAPID URINE DRUG SCREEN, HOSP PERFORMED
Amphetamines: NOT DETECTED
BARBITURATES: NOT DETECTED
Benzodiazepines: NOT DETECTED
Cocaine: NOT DETECTED
Opiates: NOT DETECTED
TETRAHYDROCANNABINOL: NOT DETECTED

## 2018-01-19 LAB — CBG MONITORING, ED: GLUCOSE-CAPILLARY: 93 mg/dL (ref 70–99)

## 2018-01-19 LAB — SALICYLATE LEVEL: Salicylate Lvl: <7 mg/dL (ref 2.8–30.0)

## 2018-01-19 LAB — ACETAMINOPHEN LEVEL: Acetaminophen (Tylenol), Serum: <10 ug/mL — ABNORMAL LOW (ref 10–30)

## 2018-01-19 LAB — ETHANOL: Alcohol, Ethyl (B): <10 mg/dL (ref <10)

## 2018-01-19 MED ORDER — LAMOTRIGINE 25 MG PO TABS
25.0000 mg | ORAL_TABLET | Freq: Two times a day (BID) | ORAL | Status: DC
  Administered 2018-01-19 – 2018-01-20 (×2): 25 mg via ORAL
  Filled 2018-01-19 (×2): qty 1

## 2018-01-19 MED ORDER — SUCRALFATE 1 G PO TABS
1.0000 g | ORAL_TABLET | Freq: Two times a day (BID) | ORAL | Status: DC
  Administered 2018-01-19 – 2018-01-20 (×2): 1 g via ORAL
  Filled 2018-01-19 (×2): qty 1

## 2018-01-19 MED ORDER — NEBIVOLOL HCL 5 MG PO TABS
5.0000 mg | ORAL_TABLET | Freq: Every day | ORAL | Status: DC
  Administered 2018-01-19 – 2018-01-20 (×2): 5 mg via ORAL
  Filled 2018-01-19 (×2): qty 1

## 2018-01-19 MED ORDER — RIVAROXABAN 20 MG PO TABS
20.0000 mg | ORAL_TABLET | Freq: Every day | ORAL | Status: DC
  Administered 2018-01-20: 20 mg via ORAL
  Filled 2018-01-19: qty 1

## 2018-01-19 MED ORDER — METFORMIN HCL 500 MG PO TABS
500.0000 mg | ORAL_TABLET | Freq: Two times a day (BID) | ORAL | Status: DC
  Administered 2018-01-20: 500 mg via ORAL
  Filled 2018-01-19: qty 1
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
## 2018-01-19 NOTE — ED Notes (Addendum)
Repeat EKG after 4 hours from last EKG (2357). Patient can be cleared if the QRS is the same or is going down. If patient QRS goes up then she needs a bicarb bolus 1 or 2 per kg per poison control.

## 2018-01-19 NOTE — ED Notes (Signed)
Bed: [facility] Expected date:  Expected time:  Means of arrival:  Comments: 

## 2018-01-19 NOTE — ED Notes (Signed)
Poison control contacted. They recommend and EKG, Acetaminophine level 4hrs post ingestion, possible IV fluids and monitor the patient at least 6 hours post ingestion along with previous orders.

## 2018-01-19 NOTE — ED Provider Notes (Signed)
[facility]-EMERGENCY DEPT Provider Note   CSN: [id number] Arrival date & time: 01/19/18  1841     History   Chief Complaint Chief Complaint  Patient presents with  . Drug Overdose    HPI Erika Woods is a 52 y.o. female.  Patient states that she took 64 Klonopin's that were 1 mg each at 4 PM today.  She took them because she is depressed but states she does not want to kill herself  The history is provided by the patient. No language interpreter was used.  Drug Overdose  This is a new problem. The current episode started 6 to 12 hours ago. The problem occurs constantly. The problem has not changed since onset.Pertinent negatives include no chest pain, no abdominal pain and no headaches. Nothing aggravates the symptoms. Nothing relieves the symptoms. She has tried nothing for the symptoms. The treatment provided no relief.    Past Medical History:  Diagnosis Date  . Diabetes mellitus without complication ([name])   . Impaired fasting glucose   . Obstructive sleep apnea on CPAP   . Osteoarthritis of knee   . Personal history of pulmonary embolism   . Renal disorder    kideny stones  . Restless leg syndrome   . Thoracic aortic aneurysm [facility])     Patient Active Problem List   Diagnosis Date Noted  . Obesity, Class III, BMI 40-49.9 (morbid obesity) ([name]) 09/16/2017  . Acute pulmonary embolism with acute cor pulmonale ([name]) 09/16/2017  . Acute superficial venous thrombosis of left lower extremity   . Thoracic aortic aneurysm without rupture ([name])   . Pulmonary emboli ([name]) 09/15/2017  . PTSD (post-traumatic stress disorder) 08/18/2011  . DM (diabetes mellitus) ([name]) 08/18/2011  . Sleep apnea 08/18/2011  . Mood disorder ([name]) 08/18/2011    Past Surgical History:  Procedure Laterality Date  . arm surgery Bilateral Skin removal  . DILATION AND EVACUATION    . KIDNEY STONE SURGERY    . KNEE SURGERY    . TONSILLECTOMY       OB History   None       Home Medications    Prior to Admission medications   Medication Sig Start Date End Date Taking? Authorizing Provider  acetaminophen (TYLENOL) 325 MG tablet Take 650 mg by mouth every 6 (six) hours as needed for mild pain or headache.    [provider]  ALPRAZolam Prudy Feeler) 0.5 MG tablet Take 0.5 mg by mouth 2 (two) times daily as needed for anxiety.  06/26/15   [provider]  clonazePAM (KLONOPIN) 1 MG tablet Take 1 mg by mouth 2 (two) times daily.     [provider]  lamoTRIgine (LAMICTAL) 25 MG tablet Take 25 mg by mouth 2 (two) times daily. 10/13/17   [provider]  Melatonin 5 MG TABS Take 10 mg by mouth at bedtime.    [provider]  metFORMIN (GLUCOPHAGE) 500 MG tablet Take 500 mg by mouth 2 (two) times daily with a meal.  10/28/13   [provider]  PRISTIQ 100 MG 24 hr tablet Take 100 mg by mouth daily.  08/06/15   [provider]  rivaroxaban (XARELTO) 20 MG TABS tablet Take 20 mg by mouth daily.    [provider]  rOPINIRole (REQUIP XL) 4 MG 24 hr tablet Take 4 mg by mouth daily. 08/18/17   [provider]    Family History Family History  Adopted: Yes    Social History  Social History   Tobacco Use  . Smoking status: Never Smoker  . Smokeless tobacco: Never Used  Substance Use Topics  . Alcohol use: Yes    Comment: special occassional - mixed drinks  . Drug use: No     Allergies   Sulfa antibiotics   Review of Systems Review of Systems  Constitutional: Negative for appetite change and fatigue.  HENT: Negative for congestion, ear discharge and sinus pressure.   Eyes: Negative for discharge.  Respiratory: Negative for cough.   Cardiovascular: Negative for chest pain.  Gastrointestinal: Negative for abdominal pain and diarrhea.  Genitourinary: Negative for frequency and hematuria.  Musculoskeletal: Negative for back pain.  Skin: Negative for rash.  Neurological: Negative for  seizures and headaches.  Psychiatric/Behavioral: Positive for dysphoric mood. Negative for hallucinations.     Physical Exam Updated Vital Signs BP 106/63 (BP Location: Right Arm)   Pulse (!) 56   Temp 98 F (36.7 C) (Oral)   Resp (!) 22   Ht 5\' 5"  (1.651 m)   Wt 136.1 kg   LMP 01/29/2013   SpO2 98%   BMI 49.92 kg/m   Physical Exam  Constitutional: She is oriented to person, place, and time. She appears well-developed.  HENT:  Head: Normocephalic.  Eyes: Conjunctivae and EOM are normal. No scleral icterus.  Neck: Neck supple. No thyromegaly present.  Cardiovascular: Normal rate and regular rhythm. Exam reveals no gallop and no friction rub.  No murmur heard. Pulmonary/Chest: No stridor. She has no wheezes. She has no rales. She exhibits no tenderness.  Abdominal: She exhibits no distension. There is no tenderness. There is no rebound.  Musculoskeletal: Normal range of motion. She exhibits no edema.  Lymphadenopathy:    She has no cervical adenopathy.  Neurological: She is oriented to person, place, and time. She exhibits normal muscle tone. Coordination normal.  Skin: No rash noted. No erythema.  Psychiatric:  Depressed but not suicidal     ED Treatments / Results  Labs (all labs ordered are listed, but only abnormal results are displayed) Labs Reviewed  COMPREHENSIVE METABOLIC PANEL  ETHANOL  SALICYLATE LEVEL  ACETAMINOPHEN LEVEL  CBC  RAPID URINE DRUG SCREEN, HOSP PERFORMED  CBG MONITORING, ED    EKG None  Radiology No results found.  Procedures Procedures (including critical care time)  Medications Ordered in ED Medications - No data to display CRITICAL CARE Performed by: Bethann Berkshire Total critical care time:36minutes Critical care time was exclusive of separately billable procedures and treating other patients. Critical care was necessary to treat or prevent imminent or life-threatening deterioration. Critical care was time spent personally by  me on the following activities: development of treatment plan with patient and/or surrogate as well as nursing, discussions with consultants, evaluation of patient's response to treatment, examination of patient, obtaining history from patient or surrogate, ordering and performing treatments and interventions, ordering and review of laboratory studies, ordering and review of radiographic studies, pulse oximetry and re-evaluation of patient's condition.   Initial Impression / Assessment and Plan / ED Course  I have reviewed the triage vital signs and the nursing notes.  Pertinent labs & imaging results that were available during my care of the patient were reviewed by me and considered in my medical decision making (see chart for details).     Patient is medically cleared after her ingestion of Klonopin.  She is seen by psych and they are looking to admit her for depression and suicidal ideation  Final Clinical Impressions(s) /  ED Diagnoses   Final diagnoses:  None    ED Discharge Orders    None       Bethann Berkshire, MD 01/19/18 2150

## 2018-01-19 NOTE — ED Triage Notes (Addendum)
Pt sent from MD office. Patient states she took 25 1mg  clonazepam tablets around 4pm. Pt denies SI, states she just wants to "dull the pain". Pt reported to EMS that she researched a lethal dose and made sure to take less than that. A/o x4.

## 2018-01-19 NOTE — Progress Notes (Signed)
TTS consulted with Nira Conn, NP who recommends inpt treatment. [facility] to review for possible admission pending medical clearance per [facility]. Pt's nurse Lamar Laundry, RN and EDP Bethann Berkshire, MD have been advised of the disposition. TTS will continue to monitor.   Princess Bruins, MSW, LCSW Therapeutic Triage Specialist  [phone]

## 2018-01-20 ENCOUNTER — Inpatient Hospital Stay (HOSPITAL_COMMUNITY): Admission: AD | Admit: 2018-01-20 | Payer: BLUE CROSS/BLUE SHIELD | Source: Intra-hospital | Admitting: Psychiatry

## 2018-01-20 DIAGNOSIS — F4310 Post-traumatic stress disorder, unspecified: Secondary | ICD-10-CM

## 2018-01-20 LAB — CBG MONITORING, ED: Glucose-Capillary: 94 mg/dL (ref 70–99)

## 2018-01-20 NOTE — ED Notes (Signed)
Patient ambulated to BR with no problem.

## 2018-01-20 NOTE — BH Assessment (Signed)
[facility] Assessment Progress Note  Per Juanetta Beets, DO, this pt does not require psychiatric hospitalization at this time.  Pt is to be discharged from [facility] with recommendation to continue treatment with Milagros Evener, MD and with Cleon Dew, [facility], her regular outpatient providers.  This has been included in pt's discharge instructions.  Pt's nurse, Morrie Sheldon, has been notified.  Doylene Canning, MA Triage Specialist [phone]

## 2018-01-20 NOTE — ED Notes (Signed)
Patient belongings taken to TCU and placed behind the nursing station.

## 2018-01-20 NOTE — Consult Note (Addendum)
[facility] Psych ED Discharge  01/20/2018 11:31 AM Erika Woods  MRN:  [id number] Principal Problem: PTSD (post-traumatic stress disorder) Discharge Diagnoses:  Patient Active Problem List   Diagnosis Date Noted  . Obesity, Class III, BMI 40-49.9 (morbid obesity) ([name]) [E66.01] 09/16/2017  . Acute pulmonary embolism with acute cor pulmonale ([name]) [I26.09] 09/16/2017  . Acute superficial venous thrombosis of left lower extremity [I82.812]   . Thoracic aortic aneurysm without rupture ([name]) [I71.2]   . Pulmonary emboli ([name]) [I26.99] 09/15/2017  . PTSD (post-traumatic stress disorder) [F43.10] 08/18/2011  . DM (diabetes mellitus) ([name]) [E11.9] 08/18/2011  . Sleep apnea [G47.30] 08/18/2011  . Mood disorder ([name]) [F39] 08/18/2011    Subjective: Pt was seen and chart reviewed with treatment team and Dr Sharma Covert. Pt denies suicidal/homicidal ideation, denies auditory/visual hallucinations and does not appear to be responding to internal stimuli. Pt is seen by Dr Evelene Croon for psychiatry and  medication management and Erika Woods for therapy. She stated she had a very hard session with her therapist yesterday and left in a bad place. She then stopped at her PCP's office for a BP check and told her PCP that she took 25 1 mg Klonopin and her PCP called the ambulance. Pt stated she researched how much Klonopin would be lethal and took the amount that would not be lethal. She has a long history of complex PTSD. Pt's husband was present during the assessment and can vouch to keep her safe at home, pt also contracts for safety. Pt has a follow up appointment with her psychiatrist today. Pt is stable and psychiatrically clear for discharge.   Total Time spent with patient: 45 minutes  Past Psychiatric History: As above  Past Medical History:  Past Medical History:  Diagnosis Date  . Diabetes mellitus without complication ([name])   . Impaired fasting glucose   . Obstructive sleep apnea on CPAP   . Osteoarthritis  of knee   . Personal history of pulmonary embolism   . Renal disorder    kideny stones  . Restless leg syndrome   . Thoracic aortic aneurysm [facility])    Past Surgical History:  Procedure Laterality Date  . arm surgery Bilateral Skin removal  . DILATION AND EVACUATION    . KIDNEY STONE SURGERY    . KNEE SURGERY    . TONSILLECTOMY     Family History:  Family History  Adopted: Yes   Family Psychiatric  History: Unknown Social History:  Social History   Substance and Sexual Activity  Alcohol Use Yes   Comment: special occassional - mixed drinks    Social History   Substance and Sexual Activity  Drug Use No   Social History   Socioeconomic History  . Marital status: Married    Spouse name: Not on file  . Number of children: 2  . Years of education: Not on file  . Highest education level: Not on file  Occupational History  . Occupation: Administrator  Social Needs  . Financial resource strain: Not on file  . Food insecurity:    Worry: Not on file    Inability: Not on file  . Transportation needs:    Medical: Not on file    Non-medical: Not on file  Tobacco Use  . Smoking status: Never Smoker  . Smokeless tobacco: Never Used  Substance and Sexual Activity  . Alcohol use: Yes    Comment: special occassional - mixed drinks  . Drug use: No  . Sexual activity: Not  on file  Lifestyle  . Physical activity:    Days per week: Not on file    Minutes per session: Not on file  . Stress: Not on file  Relationships  . Social connections:    Talks on phone: Not on file    Gets together: Not on file    Attends religious service: Not on file    Active member of club or organization: Not on file    Attends meetings of clubs or organizations: Not on file    Relationship status: Not on file  Other Topics Concern  . Not on file  Social History Narrative  . Not on file    Has this patient used any form of tobacco in the last 30 days? (Cigarettes, Smokeless Tobacco,  Cigars, and/or Pipes) Prescription not provided because: Patient does not use tobacco.  Current Medications: Current Facility-Administered Medications  Medication Dose Route Frequency Provider Last Rate Last Dose  . lamoTRIgine (LAMICTAL) tablet 25 mg  25 mg Oral BID Bethann Berkshire, MD   25 mg at 01/20/18 1020  . metFORMIN (GLUCOPHAGE) tablet 500 mg  500 mg Oral BID WC Bethann Berkshire, MD   500 mg at 01/20/18 0826  . nebivolol (BYSTOLIC) tablet 5 mg  5 mg Oral Daily Bethann Berkshire, MD   5 mg at 01/20/18 1021  . rivaroxaban (XARELTO) tablet 20 mg  20 mg Oral Daily Bethann Berkshire, MD   20 mg at 01/20/18 1020  . sucralfate (CARAFATE) tablet 1 g  1 g Oral BID Bethann Berkshire, MD   1 g at 01/20/18 1021   Current Outpatient Medications  Medication Sig Dispense Refill  . lamoTRIgine (LAMICTAL) 25 MG tablet Take 25 mg by mouth 2 (two) times daily.  0  . metFORMIN (GLUCOPHAGE) 500 MG tablet Take 500 mg by mouth 2 (two) times daily with a meal.     . nebivolol (BYSTOLIC) 5 MG tablet Take 5 mg by mouth daily.    . rivaroxaban (XARELTO) 20 MG TABS tablet Take 20 mg by mouth daily.    . sucralfate (CARAFATE) 1 g tablet Take 1 g by mouth 2 (two) times daily.      Musculoskeletal: Strength & Muscle Tone: within normal limits Gait & Station: not assessed Patient leans: N/A  Psychiatric Specialty Exam: Physical Exam  Nursing note and vitals reviewed. Constitutional: She is oriented to person, place, and time. She appears well-developed and well-nourished.  HENT:  Head: Normocephalic and atraumatic.  Neck: Normal range of motion.  Respiratory: Effort normal.  Musculoskeletal: Normal range of motion.  Neurological: She is alert and oriented to person, place, and time.  Psychiatric: Her speech is normal and behavior is normal. Thought content normal. Her mood appears anxious. Cognition and memory are normal. She expresses impulsivity. She exhibits a depressed mood.    Review of Systems   Psychiatric/Behavioral: Positive for depression. Negative for hallucinations, memory loss, substance abuse and suicidal ideas. The patient is nervous/anxious. The patient does not have insomnia.   All other systems reviewed and are negative.   Blood pressure (P) 103/68, pulse (!) (P) 59, temperature (P) 98.9 F (37.2 C), temperature source (P) Oral, resp. rate (P) 18, height 5\' 5"  (1.651 m), weight 136.1 kg, last menstrual period 01/29/2013, SpO2 (P) 95 %.Body mass index is 49.92 kg/m.  General Appearance: Casual  Eye Contact:  Good  Speech:  Clear and Coherent and Normal Rate  Volume:  Normal  Mood:  Anxious and Depressed  Affect:  Congruent  Thought Process:  Coherent, Goal Directed and Linear  Orientation:  Full (Time, Place, and Person)  Thought Content:  Logical  Suicidal Thoughts:  No  Homicidal Thoughts:  No  Memory:  Immediate;   Good Recent;   Good Remote;   Fair  Judgement:  Poor  Insight:  Fair  Psychomotor Activity:  Normal  Concentration:  Concentration: Good and Attention Span: Good  Recall:  Good  Fund of Knowledge:  Good  Language:  Good  Akathisia:  No  Handed:  Right  AIMS (if indicated):   N/A  Assets:  Communication Skills Desire for Improvement Financial Resources/Insurance Housing Social Support Vocational/Educational  ADL's:  Intact  Cognition:  WNL  Sleep:   Fair     Demographic Factors:  Caucasian  Loss Factors: NA  Historical Factors: Impulsivity  Risk Reduction Factors:   Sense of responsibility to family and Living with another person, especially a relative  Continued Clinical Symptoms:  Severe Anxiety and/or Agitation Depression:   Impulsivity Medical Diagnoses and Treatments/Surgeries  Cognitive Features That Contribute To Risk:  Closed-mindedness    Suicide Risk:  Minimal: No identifiable suicidal ideation.  Patients presenting with no risk factors but with morbid ruminations; may be classified as minimal risk based on  the severity of the depressive symptoms    Plan Of Care/Follow-up recommendations:  Activity:  as tolerated Diet:  Heart healthy  Disposition: Take all medications as prescribed. Keep all follow-up appointments as scheduled. Follow up with Dr Evelene Croon and your PCP Do not consume alcohol or use illegal drugs while on prescription medications. Report any adverse effects from your medications to your primary care provider promptly.  In the event of recurrent symptoms or worsening symptoms, call 911, a crisis hotline, or go to the nearest emergency department for evaluation.   Erika Abbe, NP 01/20/2018, 11:31 AM   Patient seen face-to-face for psychiatric evaluation, chart reviewed and case discussed with the physician extender and developed treatment plan. Reviewed the information documented and agree with the treatment plan.  Juanetta Beets, DO 01/20/18 11:14 PM

## 2018-01-20 NOTE — ED Notes (Signed)
Patient given sandwich, crackers and sprite.

## 2018-01-20 NOTE — ED Notes (Signed)
TTS AT BEDSIDE 

## 2018-01-20 NOTE — Discharge Instructions (Signed)
For your behavioral health needs, you are advised to continue treatment with your regular outpatient providers:       Milagros Evener, MD      [address]      [place], [place] [address]      [phone]       Cleon Dew, [place]      [address]      [place], [place] [address]      [phone]

## 2018-01-20 NOTE — ED Notes (Signed)
Bed: WA26 Expected date:  Expected time:  Means of arrival:  Comments: Room 9 

## 2018-01-20 NOTE — ED Provider Notes (Signed)
9:11 AM there is some concern that the patient has a wide QRS in the setting of an overdose yesterday.  I have reviewed her ECG during this visit as well as multiple priors, including July and May of this year that show she has a chronic right bundle branch block.  This appears unchanged to me.   Pricilla Loveless, MD 01/20/18 [id number]

## 2018-01-20 NOTE — ED Notes (Signed)
Poison control recommends review of medications due to wide QRS.

## 2018-02-01 ENCOUNTER — Other Ambulatory Visit (HOSPITAL_COMMUNITY): Payer: Self-pay | Admitting: Family Medicine

## 2018-02-01 DIAGNOSIS — R5383 Other fatigue: Secondary | ICD-10-CM

## 2018-02-03 ENCOUNTER — Ambulatory Visit (HOSPITAL_COMMUNITY): Payer: BLUE CROSS/BLUE SHIELD | Attending: Cardiology

## 2018-02-03 ENCOUNTER — Other Ambulatory Visit: Payer: Self-pay

## 2018-02-03 DIAGNOSIS — I77810 Thoracic aortic ectasia: Secondary | ICD-10-CM | POA: Diagnosis not present

## 2018-02-03 DIAGNOSIS — R5383 Other fatigue: Secondary | ICD-10-CM

## 2018-02-03 DIAGNOSIS — R0600 Dyspnea, unspecified: Secondary | ICD-10-CM | POA: Diagnosis not present

## 2018-02-03 DIAGNOSIS — I119 Hypertensive heart disease without heart failure: Secondary | ICD-10-CM | POA: Diagnosis not present

## 2018-02-03 DIAGNOSIS — G4733 Obstructive sleep apnea (adult) (pediatric): Secondary | ICD-10-CM | POA: Diagnosis not present

## 2018-02-03 DIAGNOSIS — Z86711 Personal history of pulmonary embolism: Secondary | ICD-10-CM | POA: Diagnosis not present

## 2018-02-03 DIAGNOSIS — E119 Type 2 diabetes mellitus without complications: Secondary | ICD-10-CM | POA: Insufficient documentation

## 2018-02-03 DIAGNOSIS — Z6841 Body Mass Index (BMI) 40.0 and over, adult: Secondary | ICD-10-CM | POA: Diagnosis not present

## 2018-02-03 DIAGNOSIS — I088 Other rheumatic multiple valve diseases: Secondary | ICD-10-CM | POA: Diagnosis not present

## 2018-02-03 DIAGNOSIS — R51 Headache: Secondary | ICD-10-CM | POA: Diagnosis not present

## 2018-02-06 ENCOUNTER — Emergency Department (HOSPITAL_COMMUNITY)
Admission: EM | Admit: 2018-02-06 | Discharge: 2018-02-06 | Disposition: A | Discharge status: Home / Self Care | Payer: BLUE CROSS/BLUE SHIELD | Attending: Emergency Medicine | Admitting: Emergency Medicine

## 2018-02-06 ENCOUNTER — Other Ambulatory Visit: Payer: Self-pay

## 2018-02-06 ENCOUNTER — Encounter (HOSPITAL_COMMUNITY): Payer: Self-pay | Admitting: Emergency Medicine

## 2018-02-06 ENCOUNTER — Ambulatory Visit (HOSPITAL_COMMUNITY)
Admission: EM | Admit: 2018-02-06 | Discharge: 2018-02-06 | Disposition: A | Discharge status: Home / Self Care | Payer: BLUE CROSS/BLUE SHIELD | Source: Home / Self Care

## 2018-02-06 ENCOUNTER — Emergency Department (HOSPITAL_COMMUNITY): Payer: BLUE CROSS/BLUE SHIELD

## 2018-02-06 DIAGNOSIS — E119 Type 2 diabetes mellitus without complications: Secondary | ICD-10-CM | POA: Diagnosis not present

## 2018-02-06 DIAGNOSIS — R0602 Shortness of breath: Secondary | ICD-10-CM | POA: Diagnosis present

## 2018-02-06 DIAGNOSIS — R51 Headache: Secondary | ICD-10-CM | POA: Diagnosis not present

## 2018-02-06 DIAGNOSIS — Z79899 Other long term (current) drug therapy: Secondary | ICD-10-CM | POA: Diagnosis not present

## 2018-02-06 DIAGNOSIS — R519 Headache, unspecified: Secondary | ICD-10-CM

## 2018-02-06 DIAGNOSIS — R05 Cough: Secondary | ICD-10-CM | POA: Insufficient documentation

## 2018-02-06 DIAGNOSIS — Z7984 Long term (current) use of oral hypoglycemic drugs: Secondary | ICD-10-CM | POA: Diagnosis not present

## 2018-02-06 DIAGNOSIS — R059 Cough, unspecified: Secondary | ICD-10-CM

## 2018-02-06 LAB — BASIC METABOLIC PANEL
Anion gap: 8 (ref 5–15)
BUN: 11 mg/dL (ref 6–20)
CALCIUM: 8.9 mg/dL (ref 8.9–10.3)
CO2: 23 mmol/L (ref 22–32)
CREATININE: 1.01 mg/dL — AB (ref 0.44–1.00)
Chloride: 107 mmol/L (ref 98–111)
GFR calc Af Amer: >60 mL/min (ref >60)
GLUCOSE: 91 mg/dL (ref 70–99)
Potassium: 4.3 mmol/L (ref 3.5–5.1)
Sodium: 138 mmol/L (ref 135–145)

## 2018-02-06 LAB — CBC
HCT: 41.3 % (ref 36.0–46.0)
HEMOGLOBIN: 12.9 g/dL (ref 12.0–15.0)
MCH: 28.7 pg (ref 26.0–34.0)
MCHC: 31.2 g/dL (ref 30.0–36.0)
MCV: 92 fL (ref 78.0–100.0)
PLATELETS: 242 10*3/uL (ref 150–400)
RBC: 4.49 MIL/uL (ref 3.87–5.11)
RDW: 14.7 % (ref 11.5–15.5)
WBC: 7.8 10*3/uL (ref 4.0–10.5)

## 2018-02-06 LAB — I-STAT TROPONIN, ED: Troponin i, poc: 0 ng/mL (ref 0.00–0.08)

## 2018-02-06 LAB — I-STAT BETA HCG BLOOD, ED (MC, WL, AP ONLY): I-stat hCG, quantitative: <5 m[IU]/mL (ref <5)

## 2018-02-06 MED ORDER — AEROCHAMBER PLUS FLO-VU MISC: 2 refills | Status: DC

## 2018-02-06 MED ORDER — LORATADINE 10 MG PO TABS: 10.0000 mg | ORAL_TABLET | Freq: Every day | ORAL | 0 refills | Status: DC

## 2018-02-06 MED ORDER — ALBUTEROL SULFATE HFA 108 (90 BASE) MCG/ACT IN AERS: 1.0000 | INHALATION_SPRAY | Freq: Four times a day (QID) | RESPIRATORY_TRACT | 0 refills | Status: DC | PRN

## 2018-02-06 MED ORDER — BENZONATATE 100 MG PO CAPS: 100.0000 mg | ORAL_CAPSULE | Freq: Three times a day (TID) | ORAL | 0 refills | Status: DC

## 2018-02-06 MED ORDER — IPRATROPIUM-ALBUTEROL 0.5-2.5 (3) MG/3ML IN SOLN
3.0000 mL | Freq: Once | RESPIRATORY_TRACT | Status: AC
  Administered 2018-02-06: 3 mL via RESPIRATORY_TRACT
  Filled 2018-02-06: qty 3

## 2018-02-06 NOTE — ED Provider Notes (Signed)
[facility] EMERGENCY DEPARTMENT Provider Note   CSN: [id number] Arrival date & time: 02/06/18  1248     History   Chief Complaint Chief Complaint  Patient presents with  . Shortness of Breath    HPI Erika Woods is a 52 y.o. female.  HPI Patient has had coughing and wheezing for a number of weeks.  She reports her doctors tried several different treatments and evaluations.  She had an echocardiogram recently that was normal.  She does have a Symbicort inhaler that she is not been instructed to use regularly.  She tried it twice a day before yesterday without any real improvement.  She is also been started on a reflux medication thinking that perhaps reflux is contributing to cough.  Cough is often dry and paroxysmal.  She reports she is also had generalized headache.  This is in her forehead and waxes and wanes.  No other associated symptoms.  No fevers, no lower extremity swelling or calf pain.  Patient chronically takes Xarelto for history of PE.  She reports she is compliant with it. Past Medical History:  Diagnosis Date  . Diabetes mellitus without complication ([name])   . Impaired fasting glucose   . Obstructive sleep apnea on CPAP   . Osteoarthritis of knee   . Personal history of pulmonary embolism   . Renal disorder    kideny stones  . Restless leg syndrome   . Thoracic aortic aneurysm [facility])     Patient Active Problem List   Diagnosis Date Noted  . Obesity, Class III, BMI 40-49.9 (morbid obesity) ([name]) 09/16/2017  . Acute pulmonary embolism with acute cor pulmonale ([name]) 09/16/2017  . Acute superficial venous thrombosis of left lower extremity   . Thoracic aortic aneurysm without rupture ([name])   . Pulmonary emboli ([name]) 09/15/2017  . PTSD (post-traumatic stress disorder) 08/18/2011  . DM (diabetes mellitus) ([name]) 08/18/2011  . Sleep apnea 08/18/2011  . Mood disorder ([name]) 08/18/2011    Past Surgical History:  Procedure Laterality Date  . arm  surgery Bilateral Skin removal  . DILATION AND EVACUATION    . KIDNEY STONE SURGERY    . KNEE SURGERY    . TONSILLECTOMY       OB History   None      Home Medications    Prior to Admission medications   Medication Sig Start Date End Date Taking? Authorizing Provider  acetaminophen (TYLENOL) 325 MG tablet Take 325-650 mg by mouth every 6 (six) hours as needed (for headaches).   Yes [provider]  clonazePAM (KLONOPIN) 1 MG tablet Take 1 mg by mouth 2 (two) times daily. 01/22/18  Yes [provider]  desvenlafaxine (PRISTIQ) 100 MG 24 hr tablet Take 25 mg by mouth daily.   Yes [provider]  lamoTRIgine (LAMICTAL) 25 MG tablet Take 25 mg by mouth 2 (two) times daily. 10/13/17  Yes [provider]  metFORMIN (GLUCOPHAGE) 500 MG tablet Take 500 mg by mouth 2 (two) times daily with a meal.  10/28/13  Yes [provider]  rivaroxaban (XARELTO) 20 MG TABS tablet Take 20 mg by mouth daily.   Yes [provider]  rOPINIRole (REQUIP XL) 4 MG 24 hr tablet Take 4 mg by mouth at bedtime.   Yes [provider]  vortioxetine HBr (TRINTELLIX) 10 MG TABS tablet Take 10 mg by mouth daily.   Yes [provider]  albuterol (PROVENTIL HFA;VENTOLIN HFA) 108 (90 Base) MCG/ACT inhaler Inhale 1-2  puffs into the lungs every 6 (six) hours as needed for wheezing or shortness of breath. 02/06/18   Arby Barrette, MD  benzonatate (TESSALON) 100 MG capsule Take 1 capsule (100 mg total) by mouth every 8 (eight) hours. 02/06/18   Arby Barrette, MD  loratadine (CLARITIN) 10 MG tablet Take 1 tablet (10 mg total) by mouth daily. 02/06/18   Arby Barrette, MD  nebivolol (BYSTOLIC) 5 MG tablet Take 5 mg by mouth daily.    [provider]  Spacer/Aero-Holding Chambers (AEROCHAMBER PLUS WITH MASK) inhaler Use as instructed 02/06/18   Arby Barrette, MD  sucralfate (CARAFATE) 1 g tablet Take 1 g by mouth 2 (two) times daily. 01/15/18   [provider]    Family History Family History  Adopted: Yes    Social History Social History   Tobacco Use  . Smoking status: Never Smoker  . Smokeless tobacco: Never Used  Substance Use Topics  . Alcohol use: Yes    Comment: special occassional - mixed drinks  . Drug use: No     Allergies   Sulfa antibiotics   Review of Systems Review of Systems 10 Systems reviewed and are negative for acute change except as noted in the HPI.  Physical Exam Updated Vital Signs BP 121/77 (BP Location: Left Arm)   Pulse 71   Temp 98.2 F (36.8 C) (Oral)   Resp 15   Ht 5\' 5"  (1.651 m)   Wt (!) 139.7 kg   LMP 01/29/2013   SpO2 95%   BMI 51.25 kg/m   Physical Exam  Constitutional: She is oriented to person, place, and time.  Patient is alert and nontoxic.  Clinically well in appearance.  No respiratory distress.  Morbid obesity.  HENT:  Head: Normocephalic and atraumatic.  Nose: Nose normal.  Mouth/Throat: Oropharynx is clear and moist.  Eyes: EOM are normal.  Neck: Neck supple.  Cardiovascular: Normal rate, regular rhythm, normal heart sounds and intact distal pulses.  Pulmonary/Chest: Effort normal.  Rare fine expiratory wheeze.  Patient intermittently has dry paroxysmal coughing.  Abdominal: Soft. She exhibits no distension. There is no tenderness. There is no guarding.  Musculoskeletal: Normal range of motion. She exhibits no edema or tenderness.  Neurological: She is alert and oriented to person, place, and time. No cranial nerve deficit. She exhibits normal muscle tone. Coordination normal.  Skin: Skin is warm and dry.  Psychiatric: She has a normal mood and affect.     ED Treatments / Results  Labs (all labs ordered are listed, but only abnormal results are displayed) Labs Reviewed  BASIC METABOLIC PANEL - Abnormal; Notable for the following components:      Result Value   Creatinine, Ser 1.01 (*)    All other components within normal limits  CBC  I-STAT  TROPONIN, ED  I-STAT BETA HCG BLOOD, ED ([facility], [facility], [facility] ONLY)    EKG None  Radiology Dg Chest 2 View  Result Date: 02/06/2018 CLINICAL DATA:  Shortness of breath and wheezing EXAM: CHEST - 2 VIEW COMPARISON:  Chest radiograph and chest CT November 10, 2017 FINDINGS: There is no edema or consolidation. The heart is upper normal in size with pulmonary vascularity normal. No adenopathy. No pneumothorax. There is mild degenerative change in the thoracic spine. IMPRESSION: No edema or consolidation. Electronically Signed   By: Bretta Bang III M.D.   On: 02/06/2018 14:30    Procedures Procedures (including critical care time)  Medications Ordered in ED Medications  ipratropium-albuterol (DUONEB)  0.5-2.5 (3) MG/3ML nebulizer solution 3 mL (3 mLs Nebulization Given 02/06/18 1549)     Initial Impression / Assessment and Plan / ED Course  I have reviewed the triage vital signs and the nursing notes.  Pertinent labs & imaging results that were available during my care of the patient were reviewed by me and considered in my medical decision making (see chart for details).     Final Clinical Impressions(s) / ED Diagnoses   Final diagnoses:  Cough  Acute nonintractable headache, unspecified headache type  Patient is clinically well appearance without dyspnea at rest.  Vital signs are normal.  No tachycardia no hypoxia.  Patient's symptoms are for several weeks of paroxysmal coughing without fever or sputum production.  Cough is dry with associated wheeze.  At this time, I have low suspicion for pneumonia.  Patient has normal chest x-ray and no leukocytosis.  Patient had echocardiogram recently that was normal, troponin negative.  Patient is compliant with Xarelto with history of PE.  Symptoms do not sound suggestive of PE.  Patient's generalized pressure-like headache and dry paroxysmal cough I have suspicion for allergic rhinitis and bronchospasm.  Will recommend trial with Claritin and albuterol  inhaler with Tessalon Perles as needed.  Patient is to schedule follow-up with her PCP to determine response to treatment.  ED Discharge Orders         Ordered    albuterol (PROVENTIL HFA;VENTOLIN HFA) 108 (90 Base) MCG/ACT inhaler  Every 6 hours PRN     02/06/18 1704    loratadine (CLARITIN) 10 MG tablet  Daily     02/06/18 1704    Spacer/Aero-Holding Chambers (AEROCHAMBER PLUS WITH MASK) inhaler     02/06/18 1704    benzonatate (TESSALON) 100 MG capsule  Every 8 hours,   Status:  Discontinued     02/06/18 1704    benzonatate (TESSALON) 100 MG capsule  Every 8 hours     02/06/18 1706           Arby Barrette, MD 02/06/18 1713

## 2018-02-06 NOTE — ED Provider Notes (Signed)
Patient not in room.   Arby Barrette, MD 02/06/18 1327

## 2018-02-06 NOTE — ED Notes (Signed)
Pt stable, ambulatory, and verbalizes understanding of d/c instructions.  

## 2018-02-06 NOTE — ED Notes (Signed)
Spoke with patient, c/o SOB x3 weeks, has seen her PCP already for same had mutliple tests done without answer to problem. Pt has hx of PE, is on xarelto. Pt states now shes having leg cramps as well. Denies CP. Spoke with pt about our capabilites here and pt decided to go to ER

## 2018-02-16 ENCOUNTER — Other Ambulatory Visit: Payer: Self-pay

## 2018-02-16 ENCOUNTER — Encounter (HOSPITAL_COMMUNITY): Payer: Self-pay

## 2018-02-16 ENCOUNTER — Emergency Department (HOSPITAL_COMMUNITY)
Admission: EM | Admit: 2018-02-16 | Discharge: 2018-02-17 | Disposition: A | Discharge status: Other Acute Inpatient Hospital | Payer: BLUE CROSS/BLUE SHIELD | Source: Home / Self Care | Attending: Emergency Medicine | Admitting: Emergency Medicine

## 2018-02-16 DIAGNOSIS — F4310 Post-traumatic stress disorder, unspecified: Secondary | ICD-10-CM | POA: Insufficient documentation

## 2018-02-16 DIAGNOSIS — Z79899 Other long term (current) drug therapy: Secondary | ICD-10-CM

## 2018-02-16 DIAGNOSIS — E119 Type 2 diabetes mellitus without complications: Secondary | ICD-10-CM

## 2018-02-16 DIAGNOSIS — F1320 Sedative, hypnotic or anxiolytic dependence, uncomplicated: Secondary | ICD-10-CM | POA: Insufficient documentation

## 2018-02-16 DIAGNOSIS — F332 Major depressive disorder, recurrent severe without psychotic features: Secondary | ICD-10-CM | POA: Diagnosis not present

## 2018-02-16 LAB — COMPREHENSIVE METABOLIC PANEL
ALT: 40 U/L (ref 0–44)
AST: 37 U/L (ref 15–41)
Albumin: 3.6 g/dL (ref 3.5–5.0)
Alkaline Phosphatase: 69 U/L (ref 38–126)
Anion gap: 10 (ref 5–15)
BUN: 14 mg/dL (ref 6–20)
CALCIUM: 9.2 mg/dL (ref 8.9–10.3)
CO2: 27 mmol/L (ref 22–32)
CREATININE: 0.99 mg/dL (ref 0.44–1.00)
Chloride: 107 mmol/L (ref 98–111)
Glucose, Bld: 107 mg/dL — ABNORMAL HIGH (ref 70–99)
Potassium: 4.1 mmol/L (ref 3.5–5.1)
Sodium: 144 mmol/L (ref 135–145)
TOTAL PROTEIN: 6.9 g/dL (ref 6.5–8.1)
Total Bilirubin: 0.4 mg/dL (ref 0.3–1.2)

## 2018-02-16 LAB — CBC
HCT: 37.7 % (ref 36.0–46.0)
Hemoglobin: 12 g/dL (ref 12.0–15.0)
MCH: 29.4 pg (ref 26.0–34.0)
MCHC: 31.8 g/dL (ref 30.0–36.0)
MCV: 92.4 fL (ref 80.0–100.0)
PLATELETS: 209 10*3/uL (ref 150–400)
RBC: 4.08 MIL/uL (ref 3.87–5.11)
RDW: 14.9 % (ref 11.5–15.5)
WBC: 6.7 10*3/uL (ref 4.0–10.5)
nRBC: 0 % (ref 0.0–0.2)

## 2018-02-16 LAB — RAPID URINE DRUG SCREEN, HOSP PERFORMED
Amphetamines: NOT DETECTED
BENZODIAZEPINES: POSITIVE — AB
Barbiturates: NOT DETECTED
Cocaine: NOT DETECTED
Opiates: NOT DETECTED
Tetrahydrocannabinol: NOT DETECTED

## 2018-02-16 LAB — I-STAT BETA HCG BLOOD, ED (MC, WL, AP ONLY)

## 2018-02-16 NOTE — ED Triage Notes (Signed)
Pt reports that she took 20 1mg  Klonopin around 7p. Pt states that she is dizzy and sleepy. She reports that she was turned away from a [facility] program at [place] and got upset. She denies SI/HI. States that she just wanted to be "numb."

## 2018-02-16 NOTE — ED Provider Notes (Signed)
[facility]-EMERGENCY DEPT Provider Note   CSN: [id number] Arrival date & time: 02/16/18  2105     History   Chief Complaint Chief Complaint  Patient presents with  . Drug Overdose    20 Klonopin    HPI Erika Woods is a 52 y.o. female.  52 year old female who took approximately 20 1 mg Klonopin tablets around 7 PM.  Patient states she did this because she was upset after not being allowed to start her outpatient counseling session today.  States that she was not trying to commit suicide but that she is trying to numb the pain and go to sleep.  She denies any homicidal ideations.  Does have a prior history of overdose in the past.  Denies any other coingestions.  Called her counselor who then told her husband who then brought the patient she denies any emesis.  Denies any somnolence.     Past Medical History:  Diagnosis Date  . Diabetes mellitus without complication ([name])   . Impaired fasting glucose   . Obstructive sleep apnea on CPAP   . Osteoarthritis of knee   . Personal history of pulmonary embolism   . Renal disorder    kideny stones  . Restless leg syndrome   . Thoracic aortic aneurysm [facility])     Patient Active Problem List   Diagnosis Date Noted  . Obesity, Class III, BMI 40-49.9 (morbid obesity) ([name]) 09/16/2017  . Acute pulmonary embolism with acute cor pulmonale ([name]) 09/16/2017  . Acute superficial venous thrombosis of left lower extremity   . Thoracic aortic aneurysm without rupture ([name])   . Pulmonary emboli ([name]) 09/15/2017  . PTSD (post-traumatic stress disorder) 08/18/2011  . DM (diabetes mellitus) ([name]) 08/18/2011  . Sleep apnea 08/18/2011  . Mood disorder ([name]) 08/18/2011    Past Surgical History:  Procedure Laterality Date  . arm surgery Bilateral Skin removal  . DILATION AND EVACUATION    . KIDNEY STONE SURGERY    . KNEE SURGERY    . TONSILLECTOMY       OB History   None      Home Medications    Prior to  Admission medications   Medication Sig Start Date End Date Taking? Authorizing Provider  acetaminophen (TYLENOL) 325 MG tablet Take 325-650 mg by mouth every 6 (six) hours as needed (for headaches).    [provider]  albuterol (PROVENTIL HFA;VENTOLIN HFA) 108 (90 Base) MCG/ACT inhaler Inhale 1-2 puffs into the lungs every 6 (six) hours as needed for wheezing or shortness of breath. 02/06/18   Arby Barrette, MD  benzonatate (TESSALON) 100 MG capsule Take 1 capsule (100 mg total) by mouth every 8 (eight) hours. 02/06/18   Arby Barrette, MD  clonazePAM (KLONOPIN) 1 MG tablet Take 1 mg by mouth 2 (two) times daily. 01/22/18   [provider]  desvenlafaxine (PRISTIQ) 100 MG 24 hr tablet Take 25 mg by mouth daily.    [provider]  lamoTRIgine (LAMICTAL) 25 MG tablet Take 25 mg by mouth 2 (two) times daily. 10/13/17   [provider]  loratadine (CLARITIN) 10 MG tablet Take 1 tablet (10 mg total) by mouth daily. 02/06/18   Arby Barrette, MD  metFORMIN (GLUCOPHAGE) 500 MG tablet Take 500 mg by mouth 2 (two) times daily with a meal.  10/28/13   [provider]  nebivolol (BYSTOLIC) 5 MG tablet Take 5 mg by mouth daily.    [provider]  rivaroxaban (XARELTO) 20 MG  TABS tablet Take 20 mg by mouth daily.    [provider]  rOPINIRole (REQUIP XL) 4 MG 24 hr tablet Take 4 mg by mouth at bedtime.    [provider]  Spacer/Aero-Holding Chambers (AEROCHAMBER PLUS WITH MASK) inhaler Use as instructed 02/06/18   Arby Barrette, MD  sucralfate (CARAFATE) 1 g tablet Take 1 g by mouth 2 (two) times daily. 01/15/18   [provider]  vortioxetine HBr (TRINTELLIX) 10 MG TABS tablet Take 10 mg by mouth daily.    [provider]    Family History Family History  Adopted: Yes    Social History Social History   Tobacco Use  . Smoking status: Never Smoker  . Smokeless tobacco: Never Used  Substance Use Topics  .  Alcohol use: Yes    Comment: special occassional - mixed drinks  . Drug use: No     Allergies   Sulfa antibiotics   Review of Systems Review of Systems  All other systems reviewed and are negative.    Physical Exam Updated Vital Signs BP (!) 144/89 (BP Location: Left Arm)   Pulse 77   Temp 98.1 F (36.7 C) (Oral)   Resp 16   LMP 01/29/2013   SpO2 98%   Physical Exam  Constitutional: She is oriented to person, place, and time. She appears well-developed and well-nourished.  Non-toxic appearance. No distress.  HENT:  Head: Normocephalic and atraumatic.  Eyes: Pupils are equal, round, and reactive to light. Conjunctivae, EOM and lids are normal.  Neck: Normal range of motion. Neck supple. No tracheal deviation present. No thyroid mass present.  Cardiovascular: Normal rate, regular rhythm and normal heart sounds. Exam reveals no gallop.  No murmur heard. Pulmonary/Chest: Effort normal and breath sounds normal. No stridor. No respiratory distress. She has no decreased breath sounds. She has no wheezes. She has no rhonchi. She has no rales.  Abdominal: Soft. Normal appearance and bowel sounds are normal. She exhibits no distension. There is no tenderness. There is no rebound and no CVA tenderness.  Musculoskeletal: Normal range of motion. She exhibits no edema or tenderness.  Neurological: She is alert and oriented to person, place, and time. She has normal strength. No cranial nerve deficit or sensory deficit. GCS eye subscore is 4. GCS verbal subscore is 5. GCS motor subscore is 6.  Skin: Skin is warm and dry. No abrasion and no rash noted.  Psychiatric: Her speech is normal. Her affect is blunt. She is withdrawn. She is not actively hallucinating. Thought content is not delusional. She exhibits a depressed mood. She expresses no suicidal ideation. She expresses no suicidal plans and no homicidal plans.  Nursing note and vitals reviewed.    ED Treatments / Results  Labs (all  labs ordered are listed, but only abnormal results are displayed) Labs Reviewed  COMPREHENSIVE METABOLIC PANEL  ETHANOL  CBC  RAPID URINE DRUG SCREEN, HOSP PERFORMED  SALICYLATE LEVEL  ACETAMINOPHEN LEVEL  I-STAT BETA HCG BLOOD, ED ([facility], [facility], [facility] ONLY)    EKG None  Radiology No results found.  Procedures Procedures (including critical care time)  Medications Ordered in ED Medications - No data to display   Initial Impression / Assessment and Plan / ED Course  I have reviewed the triage vital signs and the nursing notes.  Pertinent labs & imaging results that were available during my care of the patient were reviewed by me and considered in my medical decision making (see chart for details).  Patient awake and alert here.  She became slightly sleepy.  Ingestion was several hours prior to arrival here.  We will continue to monitor and will have TTS patient more awake.  Care signed out to Dr. Read Drivers  Final Clinical Impressions(s) / ED Diagnoses   Final diagnoses:  None    ED Discharge Orders    None       Lorre Nick, MD 02/17/18 [id number]

## 2018-02-17 ENCOUNTER — Inpatient Hospital Stay (HOSPITAL_COMMUNITY)
Admission: AD | Admit: 2018-02-17 | Discharge: 2018-02-22 | DRG: 885 | Disposition: A | Discharge status: Home / Self Care | Payer: BLUE CROSS/BLUE SHIELD | Source: Intra-hospital | Attending: Psychiatry | Admitting: Psychiatry

## 2018-02-17 ENCOUNTER — Encounter (HOSPITAL_COMMUNITY): Payer: Self-pay

## 2018-02-17 DIAGNOSIS — Z8679 Personal history of other diseases of the circulatory system: Secondary | ICD-10-CM | POA: Diagnosis not present

## 2018-02-17 DIAGNOSIS — Z7984 Long term (current) use of oral hypoglycemic drugs: Secondary | ICD-10-CM | POA: Diagnosis not present

## 2018-02-17 DIAGNOSIS — Z6841 Body Mass Index (BMI) 40.0 and over, adult: Secondary | ICD-10-CM | POA: Diagnosis not present

## 2018-02-17 DIAGNOSIS — F332 Major depressive disorder, recurrent severe without psychotic features: Secondary | ICD-10-CM

## 2018-02-17 DIAGNOSIS — Z882 Allergy status to sulfonamides status: Secondary | ICD-10-CM | POA: Diagnosis not present

## 2018-02-17 DIAGNOSIS — E119 Type 2 diabetes mellitus without complications: Secondary | ICD-10-CM | POA: Diagnosis present

## 2018-02-17 DIAGNOSIS — F39 Unspecified mood [affective] disorder: Secondary | ICD-10-CM | POA: Diagnosis present

## 2018-02-17 DIAGNOSIS — F419 Anxiety disorder, unspecified: Secondary | ICD-10-CM | POA: Diagnosis not present

## 2018-02-17 DIAGNOSIS — F411 Generalized anxiety disorder: Secondary | ICD-10-CM | POA: Diagnosis present

## 2018-02-17 DIAGNOSIS — G4733 Obstructive sleep apnea (adult) (pediatric): Secondary | ICD-10-CM | POA: Diagnosis present

## 2018-02-17 DIAGNOSIS — F4310 Post-traumatic stress disorder, unspecified: Secondary | ICD-10-CM | POA: Diagnosis present

## 2018-02-17 DIAGNOSIS — Z86718 Personal history of other venous thrombosis and embolism: Secondary | ICD-10-CM

## 2018-02-17 DIAGNOSIS — G4700 Insomnia, unspecified: Secondary | ICD-10-CM | POA: Diagnosis present

## 2018-02-17 DIAGNOSIS — G2581 Restless legs syndrome: Secondary | ICD-10-CM | POA: Diagnosis present

## 2018-02-17 DIAGNOSIS — F1320 Sedative, hypnotic or anxiolytic dependence, uncomplicated: Secondary | ICD-10-CM | POA: Diagnosis present

## 2018-02-17 DIAGNOSIS — Z86711 Personal history of pulmonary embolism: Secondary | ICD-10-CM

## 2018-02-17 DIAGNOSIS — Z79899 Other long term (current) drug therapy: Secondary | ICD-10-CM

## 2018-02-17 DIAGNOSIS — Z915 Personal history of self-harm: Secondary | ICD-10-CM

## 2018-02-17 HISTORY — DX: Major depressive disorder, recurrent severe without psychotic features: F33.2

## 2018-02-17 LAB — ACETAMINOPHEN LEVEL: Acetaminophen (Tylenol), Serum: <10 ug/mL — ABNORMAL LOW (ref 10–30)

## 2018-02-17 LAB — ETHANOL: Alcohol, Ethyl (B): <10 mg/dL (ref <10)

## 2018-02-17 LAB — SALICYLATE LEVEL: Salicylate Lvl: <7 mg/dL (ref 2.8–30.0)

## 2018-02-17 MED ORDER — ALUM & MAG HYDROXIDE-SIMETH 200-200-20 MG/5ML PO SUSP: 30.0000 mL | Freq: Four times a day (QID) | ORAL | Status: DC | PRN

## 2018-02-17 MED ORDER — VORTIOXETINE HBR 20 MG PO TABS
20.0000 mg | ORAL_TABLET | Freq: Every day | ORAL | Status: DC
  Administered 2018-02-17 – 2018-02-22 (×6): 20 mg via ORAL
  Filled 2018-02-17 (×8): qty 1

## 2018-02-17 MED ORDER — CLONAZEPAM 1 MG PO TABS
1.0000 mg | ORAL_TABLET | Freq: Two times a day (BID) | ORAL | Status: DC
  Administered 2018-02-17 – 2018-02-19 (×5): 1 mg via ORAL
  Filled 2018-02-17 (×5): qty 1

## 2018-02-17 MED ORDER — MAGNESIUM HYDROXIDE 400 MG/5ML PO SUSP: 30.0000 mL | Freq: Every day | ORAL | Status: DC | PRN

## 2018-02-17 MED ORDER — ONDANSETRON HCL 4 MG PO TABS: 4.0000 mg | ORAL_TABLET | Freq: Three times a day (TID) | ORAL | Status: DC | PRN

## 2018-02-17 MED ORDER — ACETAMINOPHEN 325 MG PO TABS: 650.0000 mg | ORAL_TABLET | Freq: Four times a day (QID) | ORAL | Status: DC | PRN

## 2018-02-17 MED ORDER — ALBUTEROL SULFATE HFA 108 (90 BASE) MCG/ACT IN AERS
2.0000 | INHALATION_SPRAY | Freq: Four times a day (QID) | RESPIRATORY_TRACT | Status: DC | PRN
  Administered 2018-02-20 – 2018-02-21 (×2): 2 via RESPIRATORY_TRACT
  Filled 2018-02-17: qty 6.7

## 2018-02-17 MED ORDER — ROPINIROLE HCL ER 4 MG PO TB24
4.0000 mg | ORAL_TABLET | Freq: Every day | ORAL | Status: DC
  Administered 2018-02-17 – 2018-02-21 (×5): 4 mg via ORAL
  Filled 2018-02-17 (×7): qty 1

## 2018-02-17 MED ORDER — RIVAROXABAN 20 MG PO TABS
20.0000 mg | ORAL_TABLET | Freq: Every day | ORAL | Status: DC
  Filled 2018-02-17 (×2): qty 1

## 2018-02-17 MED ORDER — RIVAROXABAN 20 MG PO TABS
20.0000 mg | ORAL_TABLET | Freq: Every day | ORAL | Status: DC
  Administered 2018-02-17 – 2018-02-22 (×6): 20 mg via ORAL
  Filled 2018-02-17 (×8): qty 1

## 2018-02-17 MED ORDER — METFORMIN HCL 500 MG PO TABS
500.0000 mg | ORAL_TABLET | Freq: Two times a day (BID) | ORAL | Status: DC
  Administered 2018-02-17 – 2018-02-22 (×11): 500 mg via ORAL
  Filled 2018-02-17 (×15): qty 1

## 2018-02-17 MED ORDER — LAMOTRIGINE 25 MG PO TABS
25.0000 mg | ORAL_TABLET | Freq: Two times a day (BID) | ORAL | Status: DC
  Administered 2018-02-17 – 2018-02-22 (×11): 25 mg via ORAL
  Filled 2018-02-17 (×15): qty 1

## 2018-02-17 MED ORDER — ALUM & MAG HYDROXIDE-SIMETH 200-200-20 MG/5ML PO SUSP: 30.0000 mL | ORAL | Status: DC | PRN

## 2018-02-17 MED ORDER — IBUPROFEN 200 MG PO TABS: 600.0000 mg | ORAL_TABLET | Freq: Three times a day (TID) | ORAL | Status: DC | PRN

## 2018-02-17 NOTE — ED Notes (Signed)
Poison control called pt medically cleared in one hour if no change in status per [facility] at poison control.

## 2018-02-17 NOTE — Tx Team (Signed)
Initial Treatment Plan 02/17/2018 12:06 PM [name] ZOX:[id number]    PATIENT STRESSORS: Medication change or noncompliance Traumatic event   PATIENT STRENGTHS: Average or above average intelligence Capable of independent living General fund of knowledge Motivation for treatment/growth Supportive family/friends   PATIENT IDENTIFIED PROBLEMS: "get an intensive out patient program"  Suicide Risk  Ineffective Coping  PTSD               DISCHARGE CRITERIA:  Ability to meet basic life and health needs Adequate post-discharge living arrangements Improved stabilization in mood, thinking, and/or behavior Medical problems require only outpatient monitoring Motivation to continue treatment in a less acute level of care Need for constant or close observation no longer present Reduction of life-threatening or endangering symptoms to within safe limits Safe-care adequate arrangements made Verbal commitment to aftercare and medication compliance  PRELIMINARY DISCHARGE PLAN: Outpatient therapy  PATIENT/FAMILY INVOLVEMENT: This treatment plan has been presented to and reviewed with the patient, [name].  The patient and family have been given the opportunity to ask questions and make suggestions.  Ferrel Logan, RN 02/17/2018, 12:06 PM
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
## 2018-02-17 NOTE — Progress Notes (Signed)
Erika Woods, AC at [facility] accepted pt to 403 Bed 1. The day [facility] will coordinate time of admission.
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
## 2018-02-17 NOTE — Progress Notes (Signed)
02/17/18  0840  [facility] just called with bed 403-1 and would like her there within 1 hour.

## 2018-02-17 NOTE — Progress Notes (Signed)
CSW LM for Erika Woods, supervisor at [employer], [phone].  Pt has reported she was denied admission to [facility] IOP due to a "conflict of interest" but that Darl Pikes was looking at this situation and the decision might be reversed.  CSW asked Darl Pikes to call back.  CSW was informed that Darl Pikes is out of the office until Monday, 10/14. Garner Nash, MSW, LCSW Clinical Social Worker 02/17/2018 3:25 PM
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
## 2018-02-17 NOTE — [facility] Group Notes (Signed)
Adult Psychoeducational Group Note  Date:  02/17/2018 Time:  5:00 PM  Group Topic/Focus: Education officer, museum a Wellness Toolbox:   The focus of this group is to help patients develop a "wellness toolbox" with skills and strategies to promote recovery upon discharge.  Participation Level:  Active  Participation Quality:  Appropriate  Affect:  Appropriate  Cognitive:  Appropriate and Oriented  Insight: Improving  Engagement in Group:  Developing/Improving  Modes of Intervention:  Discussion  Additional Comments:  Patient attended group and contributed to the discussion.  Marchelle Folks A Rondle Lohse 02/17/2018, 6:00 PM

## 2018-02-17 NOTE — [facility] Group Notes (Signed)
Adult Psychoeducational Group Note  Date:  02/17/2018 Time:  9:56 PM  Group Topic/Focus:  Wrap-Up Group:   The focus of this group is to help patients review their daily goal of treatment and discuss progress on daily workbooks.  Participation Level:  Active  Participation Quality:  Appropriate and Attentive  Affect:  Appropriate  Cognitive:  Alert and Appropriate  Insight: Appropriate and Good  Engagement in Group:  Engaged  Modes of Intervention:  Discussion and Education  Additional Comments:  Pt attended and participated in wrap up group this evening. Pt rated their day a 6/10, due to them having a rough start being in the ER. Pt told Clinical research associate they were expecting to go home from ER, but was admitted to [facility] instead. Pt describes her experience as being scary, fearful and feeling like a prisoner. Pt overall goal is to better deal with their emotions. They don't want to stop feeling their emotions, but to have better coping skills for the feelings, and to not feel so overwhelmed by them.   Erika Woods 02/17/2018, 9:56 PM

## 2018-02-17 NOTE — ED Notes (Signed)
TTS at bedside. 

## 2018-02-17 NOTE — Progress Notes (Signed)
02/17/18  0842 Called [facility] [phone] for transport.
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
## 2018-02-17 NOTE — Progress Notes (Signed)
Nurse, Reita Cliche informed of disposition.

## 2018-02-17 NOTE — Therapy (Signed)
Occupational therapy Group Note  Date:  02/17/2018 Time:  2:55 PM  Group Topic/Focus:  Self Care:   The focus of this group is to help patients understand the importance of self-care in order to improve or restore emotional, physical, spiritual, interpersonal, and financial health.  Participation Level:  Active  Participation Quality:  Appropriate  Affect:  Blunted  Cognitive:  Appropriate  Insight: Improving  Engagement in Group:  Engaged  Modes of Intervention:  Activity, Discussion, Education and Socialization  Additional Comments:    S: "I am really interested in learning how to manage my feelings in a healthy way"  O: Education given on self care and its importance in supporting mental health. Self care assessment given as a self rating scale for pt to identify areas of strength and weakness in physical, psychological, social, spiritual, and professional self care. Discussion given in how to improve current practices.  ?  A: Pt presents to group with blunted affect, engaged and participatory throughout. Pt identifies psychological areas to work on, specifically how to manage feelings in a healthy way. Pt very engaged and interested in OT education. ?  P: Education given on self care, handouts given to facilitate carryover into community.   Dalphine Handing, MSOT, OTR/L Behavioral Health OT/ Acute Relief OT  Dalphine Handing 02/17/2018, 2:55 PM
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
## 2018-02-17 NOTE — [facility] Group Notes (Signed)
[facility] Group Therapy 02/17/2018 1:15pm  Type of Therapy: Mental Health Association Presentation  Participation Level: Invited. Chose to remain in bed. DID NOT ATTEND   Rona Ravens, LCSW 02/17/2018 10:07 AM
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
## 2018-02-18 DIAGNOSIS — F4310 Post-traumatic stress disorder, unspecified: Secondary | ICD-10-CM

## 2018-02-18 LAB — TSH: TSH: 1.76 u[IU]/mL (ref 0.350–4.500)

## 2018-02-18 LAB — HEMOGLOBIN A1C
HEMOGLOBIN A1C: 5.7 % — AB (ref 4.8–5.6)
MEAN PLASMA GLUCOSE: 116.89 mg/dL

## 2018-02-18 LAB — LIPID PANEL
Cholesterol: 169 mg/dL (ref 0–200)
HDL: 43 mg/dL (ref >40)
LDL Cholesterol: 103 mg/dL — ABNORMAL HIGH (ref 0–99)
Total CHOL/HDL Ratio: 3.9 RATIO
Triglycerides: 113 mg/dL (ref <150)
VLDL: 23 mg/dL (ref 0–40)

## 2018-02-18 NOTE — Progress Notes (Signed)
Patient ID: Erika Woods, female   DOB: Nov 10, 1965, 52 y.o.   MRN: [id number]   D: Patient pleasant on approach tonight. Cried some from a telephone call tonight but says she is better. Reports mood down earlier today because she thought that she was going to go home instead of coming here but she is doing well. Interacting with other peers. Reports never any intention of "killing self" when she took the medication but was just wanting to sleep. She understands the consequences of her action. No active SI and contracts for safety on the unit. A: Staff will monitor on q 15 minute checks, follow treatment plans, and give medications as ordered. R: Cooperative on the unit tonight. Using CPAP machine

## 2018-02-18 NOTE — [facility] Group Notes (Signed)
[facility] LCSW Group Therapy Note  Date/Time: 02/18/18, 1315  Type of Therapy/Topic:  Group Therapy:  Balance in Life  Participation Level:  active  Description of Group:    This group will address the concept of balance and how it feels and looks when one is unbalanced. Patients will be encouraged to process areas in their lives that are out of balance, and identify reasons for remaining unbalanced. Facilitators will guide patients utilizing problem- solving interventions to address and correct the stressor making their life unbalanced. Understanding and applying boundaries will be explored and addressed for obtaining  and maintaining a balanced life. Patients will be encouraged to explore ways to assertively make their unbalanced needs known to significant others in their lives, using other group members and facilitator for support and feedback.  Therapeutic Goals: 1. Patient will identify two or more emotions or situations they have that consume much of in their lives. 2. Patient will identify signs/triggers that life has become out of balance:  3. Patient will identify two ways to set boundaries in order to achieve balance in their lives:  4. Patient will demonstrate ability to communicate their needs through discussion and/or role plays  Summary of Patient Progress:Pt very active during group discussion regarding recognizing and responding to situations where life gets out of balance.             Therapeutic Modalities:   Cognitive Behavioral Therapy Solution-Focused Therapy Assertiveness Training  Greg Yamel Bale, LCSW 

## 2018-02-18 NOTE — Progress Notes (Signed)
D: Patient is tearful and anxious.  She is observed speaking on the phone sobbing.  She states, "I don't know what's going to happen today."  She rates her depression and anxiety as a 4; hopelessness as a 2.  Her sleep and appetite are good; her energy level is low.  Patient's goal today is to "try to focus on taking care of myself and not feeling selfish about it."  She denies any thoughts of self harm.  She has poor insight regarding her use of klonopin.    A: Continue to monitor medication management and MD orders.  Safety checks completed every 15 minutes per protocol.  Offer support and encouragement as needed.  R: Patient is receptive to staff; her behavior is appropriate.

## 2018-02-18 NOTE — [facility] Group Notes (Signed)
Adult Psychoeducational Group Note  Date:  02/18/2018 Time:  9:42 PM  Group Topic/Focus:  Wrap-Up Group:   The focus of this group is to help patients review their daily goal of treatment and discuss progress on daily workbooks.  Participation Level:  Active  Participation Quality:  Appropriate and Attentive  Affect:  Appropriate  Cognitive:  Alert and Appropriate  Insight: Appropriate and Good  Engagement in Group:  Engaged  Modes of Intervention:  Discussion and Education  Additional Comments:  Pt attended and participated in wrap up group this evening. Pt rated their day a 6/10, due to their day starting off good, but then they found out they wouldn't be discharged until Sunday/Monday. Pt had a better afternoon. Pt has on going goal to focus on their self and not feel selfish about it.   Chrisandra Netters 02/18/2018, 9:42 PM

## 2018-02-18 NOTE — [facility] Group Notes (Signed)
Adult Psychoeducational Group Note  Date:  02/18/2018 Time:  9:17 AM  Group Topic/Focus:  Goals Group:   The focus of this group is to help patients establish daily goals to achieve during treatment and discuss how the patient can incorporate goal setting into their daily lives to aide in recovery.  Participation Level:  Active  Participation Quality:  Appropriate  Affect:  Appropriate  Cognitive:  Alert  Insight: Appropriate  Engagement in Group:  Engaged  Modes of Intervention:  Orientation  Additional Comments:  Pt attended orientation/goals group facilitated by MHT Leodis [place] 02/18/2018, 9:17 AM

## 2018-02-18 NOTE — Progress Notes (Signed)
[facility] MD Progress Note  02/18/2018 11:31 AM Erika Woods  MRN:  [id number] Subjective: Patient is seen and examined.  Patient is a 52 year old female with a past psychiatric history significant for major depression, generalized anxiety, posttraumatic stress disorder and some Axis II components.  She is seen in follow-up.  Initially she stated she was doing well.  She states she felt better from yesterday.  She stated her thinking was clear.  We discussed the fact that she would most likely remain in the hospital over the weekend given the significance of her 2 recent overdoses.  She became significantly tearful after that.  We discussed that short period of time that she been on the Trintellix, and the hope that it would continue to kick and it be beneficial for her depression.  She denied any current suicidal ideation. Principal Problem: <principal problem not specified> Diagnosis:   Patient Active Problem List   Diagnosis Date Noted  . Severe recurrent major depression without psychotic features ([name]) [F33.2] 02/17/2018  . Obesity, Class III, BMI 40-49.9 (morbid obesity) ([name]) [E66.01] 09/16/2017  . Acute pulmonary embolism with acute cor pulmonale ([name]) [I26.09] 09/16/2017  . Acute superficial venous thrombosis of left lower extremity [I82.812]   . Thoracic aortic aneurysm without rupture ([name]) [I71.2]   . Pulmonary emboli ([name]) [I26.99] 09/15/2017  . PTSD (post-traumatic stress disorder) [F43.10] 08/18/2011  . DM (diabetes mellitus) ([name]) [E11.9] 08/18/2011  . Sleep apnea [G47.30] 08/18/2011  . Mood disorder ([name]) [F39] 08/18/2011   Total Time spent with patient: 15 minutes  Past Psychiatric History: See admission H&P  Past Medical History:  Past Medical History:  Diagnosis Date  . Diabetes mellitus without complication ([name])   . Impaired fasting glucose   . Obstructive sleep apnea on CPAP   . Osteoarthritis of knee   . Personal history of pulmonary embolism   . Renal disorder    kideny stones  . Restless leg syndrome   . Thoracic aortic aneurysm [facility])     Past Surgical History:  Procedure Laterality Date  . arm surgery Bilateral Skin removal  . DILATION AND EVACUATION    . KIDNEY STONE SURGERY    . KNEE SURGERY    . TONSILLECTOMY     Family History:  Family History  Adopted: Yes   Family Psychiatric  History: See admission H&P Social History:  Social History   Substance and Sexual Activity  Alcohol Use Yes   Comment: special occassional - mixed drinks     Social History   Substance and Sexual Activity  Drug Use No    Social History   Socioeconomic History  . Marital status: Married    Spouse name: Not on file  . Number of children: 2  . Years of education: Not on file  . Highest education level: Not on file  Occupational History  . Occupation: Administrator  Social Needs  . Financial resource strain: Not on file  . Food insecurity:    Worry: Not on file    Inability: Not on file  . Transportation needs:    Medical: Not on file    Non-medical: Not on file  Tobacco Use  . Smoking status: Never Smoker  . Smokeless tobacco: Never Used  Substance and Sexual Activity  . Alcohol use: Yes    Comment: special occassional - mixed drinks  . Drug use: No  . Sexual activity: Not on file  Lifestyle  . Physical activity:    Days per week: Not on  file    Minutes per session: Not on file  . Stress: Not on file  Relationships  . Social connections:    Talks on phone: Not on file    Gets together: Not on file    Attends religious service: Not on file    Active member of club or organization: Not on file    Attends meetings of clubs or organizations: Not on file    Relationship status: Not on file  Other Topics Concern  . Not on file  Social History Narrative  . Not on file   Additional Social History:                         Sleep: Fair  Appetite:  Good  Current Medications: Current Facility-Administered  Medications  Medication Dose Route Frequency Provider Last Rate Last Dose  . acetaminophen (TYLENOL) tablet 650 mg  650 mg Oral Q6H PRN Nira Conn A, NP      . albuterol (PROVENTIL HFA;VENTOLIN HFA) 108 (90 Base) MCG/ACT inhaler 2 puff  2 puff Inhalation Q6H PRN Antonieta Pert, MD      . alum & mag hydroxide-simeth (MAALOX/MYLANTA) 200-200-20 MG/5ML suspension 30 mL  30 mL Oral Q4H PRN Nira Conn A, NP      . clonazePAM (KLONOPIN) tablet 1 mg  1 mg Oral BID Antonieta Pert, MD   1 mg at 02/18/18 0758  . lamoTRIgine (LAMICTAL) tablet 25 mg  25 mg Oral BID Antonieta Pert, MD   25 mg at 02/18/18 0758  . magnesium hydroxide (MILK OF MAGNESIA) suspension 30 mL  30 mL Oral Daily PRN Nira Conn A, NP      . metFORMIN (GLUCOPHAGE) tablet 500 mg  500 mg Oral BID WC Antonieta Pert, MD   500 mg at 02/18/18 0758  . rivaroxaban (XARELTO) tablet 20 mg  20 mg Oral Daily Antonieta Pert, MD   20 mg at 02/18/18 0758  . rOPINIRole (REQUIP XL) 24 hr tablet 4 mg  4 mg Oral QHS Antonieta Pert, MD   4 mg at 02/17/18 2204  . vortioxetine HBr (TRINTELLIX) tablet 20 mg  20 mg Oral Daily Antonieta Pert, MD   20 mg at 02/18/18 7829    Lab Results:  Results for orders placed or performed during the hospital encounter of 02/17/18 (from the past 48 hour(s))  Lipid panel     Status: Abnormal   Collection Time: 02/18/18  6:23 AM  Result Value Ref Range   Cholesterol 169 0 - 200 mg/dL   Triglycerides 562 <130 mg/dL   HDL 43 >86 mg/dL   Total CHOL/HDL Ratio 3.9 RATIO   VLDL 23 0 - 40 mg/dL   LDL Cholesterol 578 (H) 0 - 99 mg/dL    Comment:        Total Cholesterol/HDL:CHD Risk Coronary Heart Disease Risk Table                     Men   Women  1/2 Average Risk   3.4   3.3  Average Risk       5.0   4.4  2 X Average Risk   9.6   7.1  3 X Average Risk  23.4   11.0        Use the calculated Patient Ratio above and the CHD Risk Table to determine the patient's CHD Risk.        ATP  III  CLASSIFICATION (LDL):  <100     mg/dL   Optimal  161-096  mg/dL   Near or Above                    Optimal  130-159  mg/dL   Borderline  045-409  mg/dL   High  >811     mg/dL   Very High Performed at [facility], [address], [place], [place] [address]   TSH     Status: None   Collection Time: 02/18/18  6:23 AM  Result Value Ref Range   TSH 1.760 0.350 - 4.500 uIU/mL    Comment: Performed by a 3rd Generation assay with a functional sensitivity of <=0.01 uIU/mL. Performed at [facility], [address], [place], [place] [address]     Blood Alcohol level:  Lab Results  Component Value Date   ETH <10 02/16/2018   ETH <10 01/19/2018    Metabolic Disorder Labs: Lab Results  Component Value Date   HGBA1C 4.5 08/18/2011   No results found for: PROLACTIN Lab Results  Component Value Date   CHOL 169 02/18/2018   TRIG 113 02/18/2018   HDL 43 02/18/2018   CHOLHDL 3.9 02/18/2018   VLDL 23 02/18/2018   LDLCALC 103 (H) 02/18/2018   LDLCALC 102 (H) 08/18/2011    Physical Findings: AIMS: Facial and Oral Movements Muscles of Facial Expression: None, normal Lips and Perioral Area: None, normal Jaw: None, normal Tongue: None, normal,Extremity Movements Upper (arms, wrists, hands, fingers): None, normal Lower (legs, knees, ankles, toes): None, normal, Trunk Movements Neck, shoulders, hips: None, normal, Overall Severity Severity of abnormal movements (highest score from questions above): None, normal Incapacitation due to abnormal movements: None, normal Patient's awareness of abnormal movements (rate only patient's report): No Awareness, Dental Status Current problems with teeth and/or dentures?: No Does patient usually wear dentures?: No  CIWA:    COWS:     Musculoskeletal: Strength & Muscle Tone: within normal limits Gait & Station: normal Patient leans: N/A  Psychiatric Specialty Exam: Physical Exam  Nursing note and vitals  reviewed. Constitutional: She is oriented to person, place, and time. She appears well-developed and well-nourished.  HENT:  Head: Normocephalic and atraumatic.  Respiratory: Effort normal.  Neurological: She is alert and oriented to person, place, and time.    ROS  Blood pressure (!) 132/94, pulse 91, temperature 98.7 F (37.1 C), temperature source Oral, resp. rate 16, height 5' 2.5" (1.588 m), weight (!) 142.9 kg, last menstrual period 01/29/2013, SpO2 97 %.Body mass index is 56.7 kg/m.  General Appearance: Casual  Eye Contact:  Fair  Speech:  Normal Rate  Volume:  Decreased  Mood:  Depressed  Affect:  Congruent  Thought Process:  Coherent and Descriptions of Associations: Intact  Orientation:  Full (Time, Place, and Person)  Thought Content:  Logical  Suicidal Thoughts:  No  Homicidal Thoughts:  No  Memory:  Immediate;   Fair Recent;   Fair Remote;   Fair  Judgement:  Fair  Insight:  Fair  Psychomotor Activity:  Increased  Concentration:  Concentration: Fair and Attention Span: Fair  Recall:  [employer] of Knowledge:  Fair  Language:  Fair  Akathisia:  Negative  Handed:  Right  AIMS (if indicated):     Assets:  Communication Skills Desire for Improvement Financial Resources/Insurance Housing Intimacy Physical Health Resilience Social Support Talents/Skills  ADL's:  Intact  Cognition:  WNL  Sleep:  Number of Hours: 5.5     Treatment Plan Summary: Daily contact with patient to assess and evaluate symptoms and progress in treatment, Medication management and Plan : Patient is seen and examined.  Patient is a 52 year old female with the above-stated past psychiatric history who is seen in follow-up.  #1 major depression-she continues on Trintellix 20 mg p.o. daily.  We will continue her Lamictal 25 mg p.o. twice daily as well.  #2 generalized anxiety disorder-I have continued her Klonopin at 1 mg p.o. twice daily for now.  We need to speak with Dr. Evelene Croon prior to  discharge about whether or not to continue this medication.  #3 restless leg syndrome-continue Requip XL.  #4 diabetes mellitus type 2-blood sugar this morning is stable, continue metformin 500 mg p.o. twice daily.  #4 discharge planning-progress, we will attempt to get all the issues with the intensive outpatient program in place prior to discharge.  Antonieta Pert, MD 02/18/2018, 11:31 AM

## 2018-02-18 NOTE — Progress Notes (Addendum)
TC Burney Gauze. Feels pt needs higher level of care.  They have discussed IOP or residential at [facility].  Britta Mccreedy does not believe regular outpt is appropriate level of care--pt has continued dangerous behaviors right after leaving therapy sessions in addition to the two overdoses in the past month.   Garner Nash, MSW, LCSW Clinical Social Worker 02/18/2018 2:55 PM   CSW spoke with pt about the options of IOP or [facility] and pt agreed to consider these options and will discuss with CSW tomorrow.  CSW spoke to Burney Gauze again and she is going to come by [facility] tonight to further discuss with pt. Garner Nash, MSW, LCSW Clinical Social Worker 02/18/2018 3:22 PM

## 2018-02-19 MED ORDER — BISACODYL 5 MG PO TBEC
5.0000 mg | DELAYED_RELEASE_TABLET | Freq: Every day | ORAL | Status: DC | PRN
  Administered 2018-02-19 – 2018-02-20 (×2): 5 mg via ORAL
  Filled 2018-02-19 (×3): qty 1

## 2018-02-19 MED ORDER — CLONAZEPAM 0.5 MG PO TABS
0.5000 mg | ORAL_TABLET | Freq: Every day | ORAL | Status: DC
  Administered 2018-02-20 – 2018-02-21 (×2): 0.5 mg via ORAL
  Filled 2018-02-19 (×2): qty 1

## 2018-02-19 MED ORDER — CLONAZEPAM 1 MG PO TABS
1.0000 mg | ORAL_TABLET | Freq: Every evening | ORAL | Status: DC
  Administered 2018-02-19 – 2018-02-20 (×2): 1 mg via ORAL
  Filled 2018-02-19 (×4): qty 1
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
## 2018-02-19 NOTE — Progress Notes (Signed)
Recreation Therapy Notes  Date: 10.11.19 Time: 0930 Location: [address] Dayroom  Group Topic: Stress Management  Goal Area(s) Addresses:  Patient will verbalize importance of using healthy stress management.  Patient will identify positive emotions associated with healthy stress management.   Intervention: Stress Management  Activity :  Meditation.  LRT introduced patients to the stress management technique of meditation.  LRT played a meditation for patients to engage and follow along.  Education:  Stress Management, Discharge Planning.   Education Outcome: Acknowledges edcuation/In group clarification offered/Needs additional education  Clinical Observations/Feedback: Pt did not attend group.    Korion Cuevas, LRT/CTRS         Taraann Olthoff A 02/19/2018 11:08 AM 

## 2018-02-19 NOTE — Progress Notes (Signed)
D.  Pt pleasant on approach, denies complaints at this time other than some constipation.  Pt was positive for evening wrap up group, observed engaged in appropriate interaction with peers on the unit.  Pt demonstrated good understanding of Klonopin taper currently planned.  Pt denies SI/HI/AVH at this time.  A.  Support and encouragement offered, medication given as ordered  R. Pt remains safe on the unit, will continue to monitor.

## 2018-02-19 NOTE — Tx Team (Signed)
Interdisciplinary Treatment and Diagnostic Plan Update  02/19/2018 Time of Session: 9:20am Erika Woods MRN: [id number]  Principal Diagnosis: <principal problem not specified>  Secondary Diagnoses: Active Problems:   Severe recurrent major depression without psychotic features ([name])   Current Medications:  Current Facility-Administered Medications  Medication Dose Route Frequency Provider Last Rate Last Dose  . acetaminophen (TYLENOL) tablet 650 mg  650 mg Oral Q6H PRN Nira Conn A, NP      . albuterol (PROVENTIL HFA;VENTOLIN HFA) 108 (90 Base) MCG/ACT inhaler 2 puff  2 puff Inhalation Q6H PRN Antonieta Pert, MD      . alum & mag hydroxide-simeth (MAALOX/MYLANTA) 200-200-20 MG/5ML suspension 30 mL  30 mL Oral Q4H PRN Nira Conn A, NP      . clonazePAM Scarlette Calico) tablet 0.5 mg  0.5 mg Oral Daily Antonieta Pert, MD      . clonazePAM Scarlette Calico) tablet 1 mg  1 mg Oral QPM Antonieta Pert, MD      . lamoTRIgine (LAMICTAL) tablet 25 mg  25 mg Oral BID Antonieta Pert, MD   25 mg at 02/19/18 0746  . magnesium hydroxide (MILK OF MAGNESIA) suspension 30 mL  30 mL Oral Daily PRN Nira Conn A, NP      . metFORMIN (GLUCOPHAGE) tablet 500 mg  500 mg Oral BID WC Antonieta Pert, MD   500 mg at 02/19/18 0746  . rivaroxaban (XARELTO) tablet 20 mg  20 mg Oral Daily Antonieta Pert, MD   20 mg at 02/19/18 0746  . rOPINIRole (REQUIP XL) 24 hr tablet 4 mg  4 mg Oral QHS Antonieta Pert, MD   4 mg at 02/18/18 2112  . vortioxetine HBr (TRINTELLIX) tablet 20 mg  20 mg Oral Daily Antonieta Pert, MD   20 mg at 02/19/18 0746   PTA Medications: Medications Prior to Admission  Medication Sig Dispense Refill Last Dose  . acetaminophen (TYLENOL) 500 MG tablet Take 1,000 mg by mouth 2 (two) times daily as needed (back, shoulder, neck pain).    02/16/2018 at Unknown time  . albuterol (PROVENTIL HFA;VENTOLIN HFA) 108 (90 Base) MCG/ACT inhaler Inhale 1-2 puffs into the lungs every 6  (six) hours as needed for wheezing or shortness of breath. 1 Inhaler 0 02/16/2018 at Unknown time  . benzonatate (TESSALON) 100 MG capsule Take 1 capsule (100 mg total) by mouth every 8 (eight) hours. (Patient taking differently: Take 100 mg by mouth 2 (two) times daily. ) 21 capsule 0 Past Week at Unknown time  . cetirizine (ZYRTEC) 10 MG tablet Take 10 mg by mouth daily.   02/16/2018 at Unknown time  . clonazePAM (KLONOPIN) 1 MG tablet Take 1 mg by mouth 2 (two) times daily.  3 02/16/2018 at Unknown time  . lamoTRIgine (LAMICTAL) 25 MG tablet Take 25 mg by mouth 2 (two) times daily.  0 02/16/2018 at Unknown time  . loratadine (CLARITIN) 10 MG tablet Take 1 tablet (10 mg total) by mouth daily. (Patient not taking: Reported on 02/16/2018) 14 tablet 0 Not Taking at Unknown time  . metFORMIN (GLUCOPHAGE) 500 MG tablet Take 500 mg by mouth 2 (two) times daily with a meal.    02/16/2018 at Unknown time  . PRESCRIPTION MEDICATION Apply 1 application topically daily.   Past Week at Unknown time  . rivaroxaban (XARELTO) 20 MG TABS tablet Take 20 mg by mouth daily.   02/16/2018 at 730 am  . rOPINIRole (REQUIP XL) 4 MG 24 hr  tablet Take 4 mg by mouth at bedtime.   02/15/2018 at Unknown time  . Spacer/Aero-Holding Chambers (AEROCHAMBER PLUS WITH MASK) inhaler Use as instructed 1 each 2   . sucralfate (CARAFATE) 1 g tablet Take 1 g by mouth 3 (three) times daily.    Past Week at Unknown time  . vortioxetine HBr (TRINTELLIX) 10 MG TABS tablet Take 20 mg by mouth daily.    02/16/2018 at Unknown time    Patient Stressors: Medication change or noncompliance Traumatic event  Patient Strengths: Average or above average intelligence Capable of independent living General fund of knowledge Motivation for treatment/growth Supportive family/friends  Treatment Modalities: Medication Management, Group therapy, Case management,  1 to 1 session with clinician, Psychoeducation, Recreational therapy.   Physician Treatment  Plan for Primary Diagnosis: <principal problem not specified> Long Term Goal(s): Improvement in symptoms so as ready for discharge Improvement in symptoms so as ready for discharge   Short Term Goals: Ability to identify changes in lifestyle to reduce recurrence of condition will improve Ability to verbalize feelings will improve Ability to disclose and discuss suicidal ideas Ability to demonstrate self-control will improve Ability to identify and develop effective coping behaviors will improve Ability to maintain clinical measurements within normal limits will improve Ability to identify changes in lifestyle to reduce recurrence of condition will improve Ability to verbalize feelings will improve Ability to disclose and discuss suicidal ideas Ability to demonstrate self-control will improve Ability to identify and develop effective coping behaviors will improve Ability to maintain clinical measurements within normal limits will improve  Medication Management: Evaluate patient's response, side effects, and tolerance of medication regimen.  Therapeutic Interventions: 1 to 1 sessions, Unit Group sessions and Medication administration.  Evaluation of Outcomes: Progressing  Physician Treatment Plan for Secondary Diagnosis: Active Problems:   Severe recurrent major depression without psychotic features ([name])  Long Term Goal(s): Improvement in symptoms so as ready for discharge Improvement in symptoms so as ready for discharge   Short Term Goals: Ability to identify changes in lifestyle to reduce recurrence of condition will improve Ability to verbalize feelings will improve Ability to disclose and discuss suicidal ideas Ability to demonstrate self-control will improve Ability to identify and develop effective coping behaviors will improve Ability to maintain clinical measurements within normal limits will improve Ability to identify changes in lifestyle to reduce recurrence of condition  will improve Ability to verbalize feelings will improve Ability to disclose and discuss suicidal ideas Ability to demonstrate self-control will improve Ability to identify and develop effective coping behaviors will improve Ability to maintain clinical measurements within normal limits will improve     Medication Management: Evaluate patient's response, side effects, and tolerance of medication regimen.  Therapeutic Interventions: 1 to 1 sessions, Unit Group sessions and Medication administration.  Evaluation of Outcomes: Progressing   RN Treatment Plan for Primary Diagnosis: <principal problem not specified> Long Term Goal(s): Knowledge of disease and therapeutic regimen to maintain health will improve  Short Term Goals: Ability to verbalize feelings will improve, Ability to disclose and discuss suicidal ideas, Ability to identify and develop effective coping behaviors will improve and Compliance with prescribed medications will improve  Medication Management: RN will administer medications as ordered by provider, will assess and evaluate patient's response and provide education to patient for prescribed medication. RN will report any adverse and/or side effects to prescribing provider.  Therapeutic Interventions: 1 on 1 counseling sessions, Psychoeducation, Medication administration, Evaluate responses to treatment, Monitor vital signs and CBGs as ordered,  Perform/monitor CIWA, COWS, AIMS and Fall Risk screenings as ordered, Perform wound care treatments as ordered.  Evaluation of Outcomes: Progressing   LCSW Treatment Plan for Primary Diagnosis: <principal problem not specified> Long Term Goal(s): Safe transition to appropriate next level of care at discharge, Engage patient in therapeutic group addressing interpersonal concerns.  Short Term Goals: Engage patient in aftercare planning with referrals and resources  Therapeutic Interventions: Assess for all discharge needs, 1 to 1  time with Social worker, Explore available resources and support systems, Assess for adequacy in community support network, Educate family and significant other(s) on suicide prevention, Complete Psychosocial Assessment, Interpersonal group therapy.  Evaluation of Outcomes: Progressing   Progress in Treatment: Attending groups: Yes. Participating in groups: Yes. Taking medication as prescribed: Yes. Toleration medication: Yes. Family/Significant other contact made: Yes, individual(s) contacted:  the patient's husband Patient understands diagnosis: Yes. Discussing patient identified problems/goals with staff: Yes. Medical problems stabilized or resolved: Yes. Denies suicidal/homicidal ideation: Yes. Issues/concerns per patient self-inventory: No. Other:   New problem(s) identified: None  New Short Term/Long Term Goal(s):medication stabilization, elimination of SI thoughts, development of comprehensive mental wellness plan.    Patient Goals:  "I want to learn coping skills for my emotions and find some emotional balance"  Discharge Plan or Barriers:  Patient plans to return home and continue to follow up with Dr. Milagros Evener for outpatient medication management and Burney Gauze for therapy services.   Reason for Continuation of Hospitalization: Depression Medication stabilization Suicidal ideation  Estimated Length of Stay: 3-5 days   Attendees: Patient: Erika Woods  02/19/2018 11:42 AM  Physician: Dr. Landry Mellow, MD 02/19/2018 11:42 AM  Nursing: Arlyss Repress.Leonard Schwartz, RN 02/19/2018 11:42 AM  RN Care Manager: Onnie Boer, RN 02/19/2018 11:42 AM  Social Worker: Baldo Daub, LCSWA 02/19/2018 11:42 AM  Recreational Therapist: Juliann Pares 02/19/2018 11:42 AM  Other: Reola Calkins, NP 02/19/2018 11:42 AM  Other:  02/19/2018 11:42 AM  Other: 02/19/2018 11:42 AM    Scribe for Treatment Team: Maeola Sarah, LCSWA 02/19/2018 11:42 AM

## 2018-02-19 NOTE — Progress Notes (Signed)
Pt attended morning goals/edu group.  

## 2018-02-19 NOTE — Progress Notes (Signed)
D: Patient denies SI, HI or AVH. Patient presents as anxious but cooperative. She states that she had a rough start to her day but was able to meet with her therapist and reports that she was able to gain a different outlook regarding a setback she experienced.  Pt. States that she is working on a plan for treatment as well as a back up if that doesn't work.  Pt. Is visible in the dayroom interacting with her peers and attended/participated in evening wrap up group.  A: Patient given emotional support from RN. Patient encouraged to come to staff with concerns and/or questions. Patient's medication routine continued. Patient's orders and plan of care reviewed.   R: Patient remains appropriate and cooperative. Will continue to monitor patient q15 minutes for safety.

## 2018-02-19 NOTE — Plan of Care (Signed)
Patient self inventory- Patient slept well last night, sleep medication was not requested. Appetite has been good, energy level normal, concentration good. Depression, hopelessness, and anxiety rated 2, 2, 2. Denies SI HI AVH. Denies physical pain. Patient's goal is "staying present emotionally and physically."  Patient is compliant with medications. Safety is maintained with 15 minute checks. Will continue to monitor.  Problem: Education: Goal: Emotional status will improve Outcome: Progressing   Problem: Education: Goal: Mental status will improve Outcome: Progressing   Problem: Education: Goal: Verbalization of understanding the information provided will improve Outcome: Progressing

## 2018-02-19 NOTE — Progress Notes (Signed)
[facility] MD Progress Note  02/19/2018 10:32 AM Erika Woods  MRN:  [id number] Subjective: Patient is seen and examined.  Patient is a 52 year old female with a past psychiatric history significant for major depression, generalized anxiety, posttraumatic stress disorder as well as some Axis II components.  She is seen in follow-up.  She is doing better today.  She was upset yesterday because she was not being discharged.  She was tearful throughout the afternoon.  Her counselor as an outpatient came in and spent 40 minutes with her, and she felt better about this.  I spoke to her outpatient psychiatrist last evening, and she was in agreement with weaning off of the Klonopin.  The patient and I discussed that this morning.  She denied any current suicidal ideation.  We discussed follow-up after hospitalization including potentially [facility] [place] for possible inpatient treatment.  She denied any gross suicidal ideation this morning. Principal Problem: <principal problem not specified> Diagnosis:   Patient Active Problem List   Diagnosis Date Noted  . Severe recurrent major depression without psychotic features ([name]) [F33.2] 02/17/2018  . Obesity, Class III, BMI 40-49.9 (morbid obesity) ([name]) [E66.01] 09/16/2017  . Acute pulmonary embolism with acute cor pulmonale ([name]) [I26.09] 09/16/2017  . Acute superficial venous thrombosis of left lower extremity [I82.812]   . Thoracic aortic aneurysm without rupture ([name]) [I71.2]   . Pulmonary emboli ([name]) [I26.99] 09/15/2017  . PTSD (post-traumatic stress disorder) [F43.10] 08/18/2011  . DM (diabetes mellitus) ([name]) [E11.9] 08/18/2011  . Sleep apnea [G47.30] 08/18/2011  . Mood disorder ([name]) [F39] 08/18/2011   Total Time spent with patient: 20 minutes  Past Psychiatric History: See admission H&P  Past Medical History:  Past Medical History:  Diagnosis Date  . Diabetes mellitus without complication ([name])   . Impaired fasting  glucose   . Obstructive sleep apnea on CPAP   . Osteoarthritis of knee   . Personal history of pulmonary embolism   . Renal disorder    kideny stones  . Restless leg syndrome   . Thoracic aortic aneurysm [facility])     Past Surgical History:  Procedure Laterality Date  . arm surgery Bilateral Skin removal  . DILATION AND EVACUATION    . KIDNEY STONE SURGERY    . KNEE SURGERY    . TONSILLECTOMY     Family History:  Family History  Adopted: Yes   Family Psychiatric  History: See admission H&P Social History:  Social History   Substance and Sexual Activity  Alcohol Use Yes   Comment: special occassional - mixed drinks     Social History   Substance and Sexual Activity  Drug Use No    Social History   Socioeconomic History  . Marital status: Married    Spouse name: Not on file  . Number of children: 2  . Years of education: Not on file  . Highest education level: Not on file  Occupational History  . Occupation: Administrator  Social Needs  . Financial resource strain: Not on file  . Food insecurity:    Worry: Not on file    Inability: Not on file  . Transportation needs:    Medical: Not on file    Non-medical: Not on file  Tobacco Use  . Smoking status: Never Smoker  . Smokeless tobacco: Never Used  Substance and Sexual Activity  . Alcohol use: Yes    Comment: special occassional - mixed drinks  . Drug use: No  .  Sexual activity: Not on file  Lifestyle  . Physical activity:    Days per week: Not on file    Minutes per session: Not on file  . Stress: Not on file  Relationships  . Social connections:    Talks on phone: Not on file    Gets together: Not on file    Attends religious service: Not on file    Active member of club or organization: Not on file    Attends meetings of clubs or organizations: Not on file    Relationship status: Not on file  Other Topics Concern  . Not on file  Social History Narrative  . Not on file   Additional Social  History:                         Sleep: Good  Appetite:  Good  Current Medications: Current Facility-Administered Medications  Medication Dose Route Frequency Provider Last Rate Last Dose  . acetaminophen (TYLENOL) tablet 650 mg  650 mg Oral Q6H PRN Nira Conn A, NP      . albuterol (PROVENTIL HFA;VENTOLIN HFA) 108 (90 Base) MCG/ACT inhaler 2 puff  2 puff Inhalation Q6H PRN Antonieta Pert, MD      . alum & mag hydroxide-simeth (MAALOX/MYLANTA) 200-200-20 MG/5ML suspension 30 mL  30 mL Oral Q4H PRN Nira Conn A, NP      . clonazePAM Scarlette Calico) tablet 0.5 mg  0.5 mg Oral Daily Antonieta Pert, MD      . clonazePAM Scarlette Calico) tablet 1 mg  1 mg Oral QPM Antonieta Pert, MD      . lamoTRIgine (LAMICTAL) tablet 25 mg  25 mg Oral BID Antonieta Pert, MD   25 mg at 02/19/18 0746  . magnesium hydroxide (MILK OF MAGNESIA) suspension 30 mL  30 mL Oral Daily PRN Nira Conn A, NP      . metFORMIN (GLUCOPHAGE) tablet 500 mg  500 mg Oral BID WC Antonieta Pert, MD   500 mg at 02/19/18 0746  . rivaroxaban (XARELTO) tablet 20 mg  20 mg Oral Daily Antonieta Pert, MD   20 mg at 02/19/18 0746  . rOPINIRole (REQUIP XL) 24 hr tablet 4 mg  4 mg Oral QHS Antonieta Pert, MD   4 mg at 02/18/18 2112  . vortioxetine HBr (TRINTELLIX) tablet 20 mg  20 mg Oral Daily Antonieta Pert, MD   20 mg at 02/19/18 1610    Lab Results:  Results for orders placed or performed during the hospital encounter of 02/17/18 (from the past 48 hour(s))  Hemoglobin A1c     Status: Abnormal   Collection Time: 02/18/18  6:23 AM  Result Value Ref Range   Hgb A1c MFr Bld 5.7 (H) 4.8 - 5.6 %    Comment: (NOTE) Pre diabetes:          5.7%-6.4% Diabetes:              >6.4% Glycemic control for   <7.0% adults with diabetes    Mean Plasma Glucose 116.89 mg/dL    Comment: Performed at [facility], [address], [place], [place] [address]  Lipid panel     Status: Abnormal   Collection  Time: 02/18/18  6:23 AM  Result Value Ref Range   Cholesterol 169 0 - 200 mg/dL   Triglycerides 409 <811 mg/dL   HDL 43 >91 mg/dL   Total CHOL/HDL Ratio 3.9 RATIO  VLDL 23 0 - 40 mg/dL   LDL Cholesterol 604 (H) 0 - 99 mg/dL    Comment:        Total Cholesterol/HDL:CHD Risk Coronary Heart Disease Risk Table                     Men   Women  1/2 Average Risk   3.4   3.3  Average Risk       5.0   4.4  2 X Average Risk   9.6   7.1  3 X Average Risk  23.4   11.0        Use the calculated Patient Ratio above and the CHD Risk Table to determine the patient's CHD Risk.        ATP III CLASSIFICATION (LDL):  <100     mg/dL   Optimal  540-981  mg/dL   Near or Above                    Optimal  130-159  mg/dL   Borderline  191-478  mg/dL   High  >295     mg/dL   Very High Performed at [facility], [address] [place], [place] [address]   TSH     Status: None   Collection Time: 02/18/18  6:23 AM  Result Value Ref Range   TSH 1.760 0.350 - 4.500 uIU/mL    Comment: Performed by a 3rd Generation assay with a functional sensitivity of <=0.01 uIU/mL. Performed at [facility], [address], [place], [place] [address]     Blood Alcohol level:  Lab Results  Component Value Date   ETH <10 02/16/2018   ETH <10 01/19/2018    Metabolic Disorder Labs: Lab Results  Component Value Date   HGBA1C 5.7 (H) 02/18/2018   MPG 116.89 02/18/2018   No results found for: PROLACTIN Lab Results  Component Value Date   CHOL 169 02/18/2018   TRIG 113 02/18/2018   HDL 43 02/18/2018   CHOLHDL 3.9 02/18/2018   VLDL 23 02/18/2018   LDLCALC 103 (H) 02/18/2018   LDLCALC 102 (H) 08/18/2011    Physical Findings: AIMS: Facial and Oral Movements Muscles of Facial Expression: None, normal Lips and Perioral Area: None, normal Jaw: None, normal Tongue: None, normal,Extremity Movements Upper (arms, wrists, hands, fingers): None, normal Lower (legs, knees,  ankles, toes): None, normal, Trunk Movements Neck, shoulders, hips: None, normal, Overall Severity Severity of abnormal movements (highest score from questions above): None, normal Incapacitation due to abnormal movements: None, normal Patient's awareness of abnormal movements (rate only patient's report): No Awareness, Dental Status Current problems with teeth and/or dentures?: No Does patient usually wear dentures?: No  CIWA:    COWS:     Musculoskeletal: Strength & Muscle Tone: within normal limits Gait & Station: normal Patient leans: N/A  Psychiatric Specialty Exam: Physical Exam  Nursing note and vitals reviewed. Constitutional: She is oriented to person, place, and time. She appears well-developed and well-nourished.  HENT:  Head: Normocephalic and atraumatic.  Respiratory: Effort normal.  Neurological: She is alert and oriented to person, place, and time.    ROS  Blood pressure (!) 130/97, pulse 87, temperature 98.7 F (37.1 C), resp. rate 16, height 5' 2.5" (1.588 m), weight (!) 142.9 kg, last menstrual period 01/29/2013, SpO2 97 %.Body mass index is 56.7 kg/m.  General Appearance: Casual  Eye Contact:  Fair  Speech:  Normal Rate  Volume:  Normal  Mood:  Anxious  Affect:  Congruent  Thought Process:  Coherent and Descriptions of Associations: Intact  Orientation:  Full (Time, Place, and Person)  Thought Content:  Logical  Suicidal Thoughts:  No  Homicidal Thoughts:  No  Memory:  Immediate;   Fair Recent;   Fair Remote;   Fair  Judgement:  Intact  Insight:  Fair  Psychomotor Activity:  Increased  Concentration:  Concentration: Fair and Attention Span: Fair  Recall:  [employer] of Knowledge:  Fair  Language:  Good  Akathisia:  Negative  Handed:  Right  AIMS (if indicated):     Assets:  Communication Skills Desire for Improvement Financial Resources/Insurance Housing Intimacy Leisure Time Physical Health Social Support Talents/Skills  ADL's:  Intact   Cognition:  WNL  Sleep:  Number of Hours: 6.75     Treatment Plan Summary: Daily contact with patient to assess and evaluate symptoms and progress in treatment, Medication management and Plan : Patient is seen and examined.  Patient is a 52 year old female with the above-stated past psychiatric history seen in follow-up.  #1 major depression-she continues on Trintellix 20 mg p.o. daily for mood.  We will continue her Lamictal 25 mg p.o. twice daily for mood stability as well.  #2 generalized anxiety disorder/intentional benzodiazepine overdose-I spoke with Dr. Evelene Croon last p.m., and she is in agreement with weaning the patient off of the clonazepam.  She will receive 1 mg p.o. twice daily today, tomorrow we will go to 0.5 mg in the a.m. and 1 mg in the p.m., and the day after that we will go to 0.5 mg p.o. twice daily.  We will monitor for elevations in blood pressure as well as heart rate and other withdrawal symptoms.  #3 restless leg syndrome-no change in Requip XL.  #4 diabetes mellitus type 2-blood sugar is stable, continue metformin 500 mg p.o. twice daily.  #4 discharge planning-it is in progress.  It appears as though the outpatient program in [place] will not be available, and she is personally working on the potential of going to the [facility] facility in [place].  Antonieta Pert, MD 02/19/2018, 10:32 AM

## 2018-02-20 NOTE — [facility] Group Notes (Signed)
[facility] Group Notes:  (Nursing/MHT/Case Management/Adjunct)  Date:  02/20/2018  Time:  2:40 PM  Type of Therapy:  Nurse Education  Participation Level:  Active  Participation Quality:  Appropriate and Attentive  Affect:  Appropriate  Cognitive:  Alert and Appropriate  Insight:  Appropriate and Good  Engagement in Group:  Engaged  Modes of Intervention:  Discussion and Education  Summary of Progress/Problems: In this group, RN discussed needs assessment, behaviors and behavior modification. Patients were asked to share what things are needed every day to both live and thrive. They then discussed behaviors that result from not having our needs met, and ways they can make choices to change those behaviors. For homework, they were asked to take the love languages test listed in the workbook and begin to communicate their love languages to others here and at home.  Lesli Albee 02/20/2018, 2:40 PM

## 2018-02-20 NOTE — Plan of Care (Signed)
D: Patient presents cooperative, pleasant. She complains of wheezing, SOB. She also complains of constipation, last BM 10/8. She complains of back and neck pain 5/10. She received dulcolax yesterday evening. She reports her mood is good. She slept well last night, and did not request medication. Her appetite is good, energy normal and concentration good. She rates her depression, hopelessness and anxiety 1/10. Patient denies SI/HI/AVH.  A: Administered Ventolin for wheezing SOB and dulcolax for constipation. Patient checked q15 min, and checks reviewed. Reviewed medication changes with patient and educated on side effects. Educated patient on importance of attending group therapy sessions and educated on several coping skills. Encouarged participation in milieu through recreation therapy and attending meals with peers. Support and encouragement provided. Fluids offered. R: Patient receptive to education on medications, and is medication compliant. Patient contracts for safety on the unit. Goal: "keep learning strategies to help me" and "attend all sessions."

## 2018-02-20 NOTE — [facility] Group Notes (Addendum)
Adult Psychoeducational Group Note  Date:  02/20/2018 Time:  9:57 PM  Group Topic/Focus:  Wrap-Up Group:   The focus of this group is to help patients review their daily goal of treatment and discuss progress on daily workbooks.  Participation Level:  Active  Participation Quality:  Appropriate and Attentive  Affect:  Appropriate  Cognitive:  Alert and Appropriate  Insight: Appropriate and Good  Engagement in Group:  Engaged  Modes of Intervention:  Discussion and Education  Additional Comments:  Pt attended and participated in wrap up group this evening. Pt rated their day a 10/10, due to them going outside and having a good meeting that gave them more clarity to their own set backs. Pt completed their goal which was to learn more about their thought process when they do things, such as overdose, cut etc.    Chrisandra Netters 02/20/2018, 9:57 PM

## 2018-02-20 NOTE — Progress Notes (Signed)
D.  Pt pleasant and appropriate on approach, denies complaints at this time.  Pt was positive for evening wrap up group, observed interacting appropriately with peers on the unit.  Pt denies SI/HI/AVH at this time.  A.  Support and encouragement offered, medication given as ordered  R.  Pt remains safe on the unit,will continue to monitor.

## 2018-02-20 NOTE — Progress Notes (Signed)
[facility] MD Progress Note  02/20/2018 11:27 AM FARIHA GOTO  MRN:  [id number] Subjective: Patient is seen and examined.  Patient is a 52 year old female with a past psychiatric history significant for major depression, generalized anxiety disorder, posttraumatic stress disorder as well as some Axis II components.  She is seen in follow-up.  She is doing better today.  She stated that on the first day of admission when she found out she was not going to be able to go home she was tearful all day.  She stated that after we met yesterday she was able to compensate and do well.  She was upset she was going to be unable to go to the reunion, but she understood what was going on.  She also understood the need for reduction and elimination of the benzodiazepines.  She is talked to social work about [facility] [place] after her [facility].  She stated she slept well last night, and denied any suicidal ideation.  Vital signs are stable, she is afebrile.  Heart rates 94.  We talked about her taper off the benzodiazepines in detail. Principal Problem: <principal problem not specified> Diagnosis:   Patient Active Problem List   Diagnosis Date Noted  . Severe recurrent major depression without psychotic features ([place]) [F33.2] 02/17/2018  . Obesity, Class III, BMI 40-49.9 (morbid obesity) ([place]) [E66.01] 09/16/2017  . Acute pulmonary embolism with acute cor pulmonale ([place]) [I26.09] 09/16/2017  . Acute superficial venous thrombosis of left lower extremity [I82.812]   . Thoracic aortic aneurysm without rupture ([place]) [I71.2]   . Pulmonary emboli ([place]) [I26.99] 09/15/2017  . PTSD (post-traumatic stress disorder) [F43.10] 08/18/2011  . DM (diabetes mellitus) ([place]) [E11.9] 08/18/2011  . Sleep apnea [G47.30] 08/18/2011  . Mood disorder ([place]) [F39] 08/18/2011   Total Time spent with patient: 15 minutes  Past Psychiatric History: See admission H&P  Past Medical History:  Past Medical History:   Diagnosis Date  . Diabetes mellitus without complication ([place])   . Impaired fasting glucose   . Obstructive sleep apnea on CPAP   . Osteoarthritis of knee   . Personal history of pulmonary embolism   . Renal disorder    kideny stones  . Restless leg syndrome   . Thoracic aortic aneurysm [facility])     Past Surgical History:  Procedure Laterality Date  . arm surgery Bilateral Skin removal  . DILATION AND EVACUATION    . KIDNEY STONE SURGERY    . KNEE SURGERY    . TONSILLECTOMY     Family History:  Family History  Adopted: Yes   Family Psychiatric  History: See admission H&P Social History:  Social History   Substance and Sexual Activity  Alcohol Use Yes   Comment: special occassional - mixed drinks     Social History   Substance and Sexual Activity  Drug Use No    Social History   Socioeconomic History  . Marital status: Married    Spouse name: Not on file  . Number of children: 2  . Years of education: Not on file  . Highest education level: Not on file  Occupational History  . Occupation: Aeronautical engineer  Social Needs  . Financial resource strain: Not on file  . Food insecurity:    Worry: Not on file    Inability: Not on file  . Transportation needs:    Medical: Not on file    Non-medical: Not on file  Tobacco Use  . Smoking status: Never  Smoker  . Smokeless tobacco: Never Used  Substance and Sexual Activity  . Alcohol use: Yes    Comment: special occassional - mixed drinks  . Drug use: No  . Sexual activity: Not on file  Lifestyle  . Physical activity:    Days per week: Not on file    Minutes per session: Not on file  . Stress: Not on file  Relationships  . Social connections:    Talks on phone: Not on file    Gets together: Not on file    Attends religious service: Not on file    Active member of club or organization: Not on file    Attends meetings of clubs or organizations: Not on file    Relationship status: Not on file  Other Topics  Concern  . Not on file  Social History Narrative  . Not on file   Additional Social History:                         Sleep: Good  Appetite:  Good  Current Medications: Current Facility-Administered Medications  Medication Dose Route Frequency Provider Last Rate Last Dose  . acetaminophen (TYLENOL) tablet 650 mg  650 mg Oral Q6H PRN Lindon Romp A, NP      . albuterol (PROVENTIL HFA;VENTOLIN HFA) 108 (90 Base) MCG/ACT inhaler 2 puff  2 puff Inhalation Q6H PRN Sharma Covert, MD   2 puff at 02/20/18 0840  . alum & mag hydroxide-simeth (MAALOX/MYLANTA) 200-200-20 MG/5ML suspension 30 mL  30 mL Oral Q4H PRN Lindon Romp A, NP      . bisacodyl (DULCOLAX) EC tablet 5 mg  5 mg Oral Daily PRN Patriciaann Clan E, PA-C   5 mg at 02/20/18 0839  . clonazePAM (KLONOPIN) tablet 0.5 mg  0.5 mg Oral Daily Sharma Covert, MD   0.5 mg at 02/20/18 0836  . clonazePAM (KLONOPIN) tablet 1 mg  1 mg Oral QPM Sharma Covert, MD   1 mg at 02/19/18 2103  . lamoTRIgine (LAMICTAL) tablet 25 mg  25 mg Oral BID Sharma Covert, MD   25 mg at 02/20/18 0836  . magnesium hydroxide (MILK OF MAGNESIA) suspension 30 mL  30 mL Oral Daily PRN Lindon Romp A, NP      . metFORMIN (GLUCOPHAGE) tablet 500 mg  500 mg Oral BID WC Sharma Covert, MD   500 mg at 02/20/18 0835  . rivaroxaban (XARELTO) tablet 20 mg  20 mg Oral Daily Sharma Covert, MD   20 mg at 02/20/18 0836  . rOPINIRole (REQUIP XL) 24 hr tablet 4 mg  4 mg Oral QHS Sharma Covert, MD   4 mg at 02/19/18 2104  . vortioxetine HBr (TRINTELLIX) tablet 20 mg  20 mg Oral Daily Sharma Covert, MD   20 mg at 02/20/18 7619    Lab Results: No results found for this or any previous visit (from the past 48 hour(s)).  Blood Alcohol level:  Lab Results  Component Value Date   ETH <10 02/16/2018   ETH <10 50/93/2671    Metabolic Disorder Labs: Lab Results  Component Value Date   HGBA1C 5.7 (H) 02/18/2018   MPG 116.89 02/18/2018    No results found for: PROLACTIN Lab Results  Component Value Date   CHOL 169 02/18/2018   TRIG 113 02/18/2018   HDL 43 02/18/2018   CHOLHDL 3.9 02/18/2018   VLDL 23 02/18/2018   LDLCALC 103 (  H) 02/18/2018   LDLCALC 102 (H) 08/18/2011    Physical Findings: AIMS: Facial and Oral Movements Muscles of Facial Expression: None, normal Lips and Perioral Area: None, normal Jaw: None, normal Tongue: None, normal,Extremity Movements Upper (arms, wrists, hands, fingers): None, normal Lower (legs, knees, ankles, toes): None, normal, Trunk Movements Neck, shoulders, hips: None, normal, Overall Severity Severity of abnormal movements (highest score from questions above): None, normal Incapacitation due to abnormal movements: None, normal Patient's awareness of abnormal movements (rate only patient's report): No Awareness, Dental Status Current problems with teeth and/or dentures?: No Does patient usually wear dentures?: No  CIWA:  CIWA-Ar Total: 0 COWS:  COWS Total Score: 0  Musculoskeletal: Strength & Muscle Tone: within normal limits Gait & Station: normal Patient leans: N/A  Psychiatric Specialty Exam: Physical Exam  Nursing note and vitals reviewed. Constitutional: She is oriented to person, place, and time. She appears well-developed and well-nourished.  HENT:  Head: Normocephalic and atraumatic.  Respiratory: Effort normal.  Neurological: She is alert and oriented to person, place, and time.    ROS  Blood pressure 123/84, pulse 94, temperature 98.5 F (36.9 C), resp. rate 20, height 5' 2.5" (1.588 m), weight (!) 142.9 kg, last menstrual period 01/29/2013, SpO2 97 %.Body mass index is 56.7 kg/m.  General Appearance: Casual  Eye Contact:  Fair  Speech:  Normal Rate  Volume:  Normal  Mood:  Anxious  Affect:  Congruent  Thought Process:  Coherent and Descriptions of Associations: Intact  Orientation:  Full (Time, Place, and Person)  Thought Content:  Logical  Suicidal  Thoughts:  No  Homicidal Thoughts:  No  Memory:  Immediate;   Fair Recent;   Fair Remote;   Fair  Judgement:  Intact  Insight:  Fair  Psychomotor Activity:  Normal  Concentration:  Concentration: Fair and Attention Span: Fair  Recall:  [employer] of Knowledge:  Fair  Language:  Good  Akathisia:  Negative  Handed:  Right  AIMS (if indicated):     Assets:  Communication Skills Desire for Improvement Financial Resources/Insurance Housing Intimacy Leisure Time Physical Health Resilience Social Support Talents/Skills  ADL's:  Intact  Cognition:  WNL  Sleep:  Number of Hours: 6.75     Treatment Plan Summary: Daily contact with patient to assess and evaluate symptoms and progress in treatment, Medication management and Plan : Patient is seen and examined.  Patient is a 52 year old female with the above-stated past psychiatric history who is seen in follow-up.  #1 major depression-she continues on Trintellix 20 mg p.o. daily for mood and anxiety.  She continues on the Lamictal 25 mg p.o. twice daily for mood stability as well.  #2 generalized anxiety disorder/intentional benzodiazepine overdose-she will get Canasa Pam 0.5 mg this a.m., and 1 mg in the p.m.  Tomorrow the plan will be if everything goes well to go to 0.5 mg p.o. twice daily.  #3 restless leg syndrome-no change in Requ[place].  #4 diabetes mellitus type 2-blood sugar remained stable on metformin 500 mg p.o. twice daily.  #5 disposition planning-in progress, the outpatient program in [place] will most likely not be available, and she is working on the potential of [facility] [place].  Sharma Covert, MD 02/20/2018, 11:27 AM

## 2018-02-20 NOTE — [facility] Group Notes (Signed)
[facility] LCSW Group Therapy Note  Date/Time:    02/20/2018   10:15 - 11:15 AM  Type of Therapy and Topic:  Group Therapy:  Thought Distortions  Participation Level:  Active   Description of Group:  In this group, patients were introduced to cognitive distortions and discussed how these can negatively affect lives.  This included All or Nothing thinking, Labeling, Focusing on the Negative, the Shoulds, Blaming, Predicting the Future, Overgeneralization, Mind Reading, Catastrophizing, Emotional Reasoning, Personalization, and Jumping to Conclusions.  Patients then used a worksheet to describe an upsetting event in their life and the Automatic Thoughts they had about that event.  They identified the type of cognitive distortion they used and worked with the group to come up with a more realistic thought to refute their original Automatic Thought.  Therapeutic Goals: 1. Patient will be able to identify this exercise of examining their thoughts as a healthy coping mechanism. 2. Patient will identify a problem area in their life and the automatic thoughts they have about that situation. 3. Patient will verbalize the feelings and outcomes typically associated with their thoughts. 4. Patient will think about and discuss the available evidence supporting their thoughts, as well as assumptions they have to make to believe it. 5. Patient will identify evidence or facts that refute their cognitive distortion. 6. Patient will verbalize a more realistic, helpful conclusion.  Summary of Patient Progress:  The patient expressed that she is in the hospital due to a deliberate overdose on Klonopin.  She took notes throughout group on the different types of cognitive distortions and shared another situation which enabled the group to join in the exercise of analyzing the thinking types and challenge whether other possible explanations would be more reasonable.  She showed strong evidence of growing in insight during  this process.   Therapeutic Modalities Cognitive Behavioral Therapy Dialectical Behavioral Therapy  Ambrose Mantle, LCSW 02/20/2018 12:00PM

## 2018-02-20 NOTE — [facility] Group Notes (Signed)
[facility] Group Notes:  (Nursing/MHT/Case Management/Adjunct)  Date:  02/20/2018  Time:  4:56 PM  Type of Therapy:  Nurse Education  Participation Level:  Active  Participation Quality:  Appropriate and Attentive  Affect:  Appropriate  Cognitive:  Alert and Appropriate  Insight:  Good  Engagement in Group:  Engaged  Modes of Intervention:  Discussion and Education  Summary of Progress/Problems: In this group, we reflected on the day and what each patient learned. We discussed how we can make changes to prevent returning to the hospital. We also discussed TIPP distress tolerance skills, and how to use them to bring down elevated emotion. Patients were educated on when to use distress tolerance skills.  Kirstie Mirza 02/20/2018, 4:56 PM

## 2018-02-21 DIAGNOSIS — F419 Anxiety disorder, unspecified: Secondary | ICD-10-CM

## 2018-02-21 DIAGNOSIS — E119 Type 2 diabetes mellitus without complications: Secondary | ICD-10-CM

## 2018-02-21 MED ORDER — CLONAZEPAM 0.5 MG PO TABS
0.5000 mg | ORAL_TABLET | Freq: Two times a day (BID) | ORAL | Status: DC
  Administered 2018-02-21 – 2018-02-22 (×2): 0.5 mg via ORAL
  Filled 2018-02-21 (×3): qty 1

## 2018-02-21 NOTE — Progress Notes (Signed)
[facility] MD Progress Note  02/21/2018 10:21 AM Erika Woods  MRN:  [id number] Subjective: Doing good at this moment. We had a great group sessions yesterday and it offered so much more than before. We talked about putting our thoughts on trial, and taking our stressful events and listing them down in different ways. We learned about overgeneralization and blaming.    Objective: Patient is seen and examined.  Patient is a 52 year old female with a past psychiatric history significant for major depression, generalized anxiety disorder, posttraumatic stress disorder as well as some Axis II components. Patient is seen and case discussed with MD. Patient is alert and oriented today, she is also clam and cooperative. She is observed completing a puzzle with a peer. She is very engaged with Clinical research associate, and is able to offer much insight and improved judgement. She is able to identify her reasons that lead to her admission, things she can do to prevent a relapse. She reports her goal today is to continue working on cognitive learning and speaking in the moment. She is able to safely identify her plans for discharge, at this time. She is being weaned off Klonopin, and denies any urges, withdrawal symptoms or cravings at this time. She denies any depressive symptoms and identifies coping skills. She denies any suicidal ideations at this time.   Principal Problem: <principal problem not specified> Diagnosis:   Patient Active Problem List   Diagnosis Date Noted  . Severe recurrent major depression without psychotic features ([name]) [F33.2] 02/17/2018  . Obesity, Class III, BMI 40-49.9 (morbid obesity) ([name]) [E66.01] 09/16/2017  . Acute pulmonary embolism with acute cor pulmonale ([name]) [I26.09] 09/16/2017  . Acute superficial venous thrombosis of left lower extremity [I82.812]   . Thoracic aortic aneurysm without rupture ([name]) [I71.2]   . Pulmonary emboli ([name]) [I26.99] 09/15/2017  . PTSD (post-traumatic stress  disorder) [F43.10] 08/18/2011  . DM (diabetes mellitus) ([name]) [E11.9] 08/18/2011  . Sleep apnea [G47.30] 08/18/2011  . Mood disorder ([name]) [F39] 08/18/2011   Total Time spent with patient: 20 minutes  Past Psychiatric History: See admission H&P  Past Medical History:  Past Medical History:  Diagnosis Date  . Diabetes mellitus without complication ([name])   . Impaired fasting glucose   . Obstructive sleep apnea on CPAP   . Osteoarthritis of knee   . Personal history of pulmonary embolism   . Renal disorder    kideny stones  . Restless leg syndrome   . Thoracic aortic aneurysm [facility])     Past Surgical History:  Procedure Laterality Date  . arm surgery Bilateral Skin removal  . DILATION AND EVACUATION    . KIDNEY STONE SURGERY    . KNEE SURGERY    . TONSILLECTOMY     Family History:  Family History  Adopted: Yes   Family Psychiatric  History: See admission H&P Social History:  Social History   Substance and Sexual Activity  Alcohol Use Yes   Comment: special occassional - mixed drinks     Social History   Substance and Sexual Activity  Drug Use No    Social History   Socioeconomic History  . Marital status: Married    Spouse name: Not on file  . Number of children: 2  . Years of education: Not on file  . Highest education level: Not on file  Occupational History  . Occupation: Administrator  Social Needs  . Financial resource strain: Not on file  . Food insecurity:    Worry: Not  on file    Inability: Not on file  . Transportation needs:    Medical: Not on file    Non-medical: Not on file  Tobacco Use  . Smoking status: Never Smoker  . Smokeless tobacco: Never Used  Substance and Sexual Activity  . Alcohol use: Yes    Comment: special occassional - mixed drinks  . Drug use: No  . Sexual activity: Not on file  Lifestyle  . Physical activity:    Days per week: Not on file    Minutes per session: Not on file  . Stress: Not on file   Relationships  . Social connections:    Talks on phone: Not on file    Gets together: Not on file    Attends religious service: Not on file    Active member of club or organization: Not on file    Attends meetings of clubs or organizations: Not on file    Relationship status: Not on file  Other Topics Concern  . Not on file  Social History Narrative  . Not on file   Additional Social History:                         Sleep: Fair  Appetite:  much better  Current Medications: Current Facility-Administered Medications  Medication Dose Route Frequency Provider Last Rate Last Dose  . acetaminophen (TYLENOL) tablet 650 mg  650 mg Oral Q6H PRN Nira Conn A, NP      . albuterol (PROVENTIL HFA;VENTOLIN HFA) 108 (90 Base) MCG/ACT inhaler 2 puff  2 puff Inhalation Q6H PRN Antonieta Pert, MD   2 puff at 02/20/18 0840  . alum & mag hydroxide-simeth (MAALOX/MYLANTA) 200-200-20 MG/5ML suspension 30 mL  30 mL Oral Q4H PRN Nira Conn A, NP      . bisacodyl (DULCOLAX) EC tablet 5 mg  5 mg Oral Daily PRN Donell Sievert E, PA-C   5 mg at 02/20/18 0839  . clonazePAM (KLONOPIN) tablet 0.5 mg  0.5 mg Oral Daily Antonieta Pert, MD   0.5 mg at 02/21/18 8119  . clonazePAM (KLONOPIN) tablet 1 mg  1 mg Oral QPM Antonieta Pert, MD   1 mg at 02/20/18 2137  . lamoTRIgine (LAMICTAL) tablet 25 mg  25 mg Oral BID Antonieta Pert, MD   25 mg at 02/21/18 1478  . magnesium hydroxide (MILK OF MAGNESIA) suspension 30 mL  30 mL Oral Daily PRN Nira Conn A, NP      . metFORMIN (GLUCOPHAGE) tablet 500 mg  500 mg Oral BID WC Antonieta Pert, MD   500 mg at 02/21/18 2956  . rivaroxaban (XARELTO) tablet 20 mg  20 mg Oral Daily Antonieta Pert, MD   20 mg at 02/21/18 2130  . rOPINIRole (REQUIP XL) 24 hr tablet 4 mg  4 mg Oral QHS Antonieta Pert, MD   4 mg at 02/20/18 2137  . vortioxetine HBr (TRINTELLIX) tablet 20 mg  20 mg Oral Daily Antonieta Pert, MD   20 mg at 02/21/18 8657     Lab Results: No results found for this or any previous visit (from the past 48 hour(s)).  Blood Alcohol level:  Lab Results  Component Value Date   [facility] <10 02/16/2018   ETH <10 01/19/2018    Metabolic Disorder Labs: Lab Results  Component Value Date   HGBA1C 5.7 (H) 02/18/2018   MPG 116.89 02/18/2018   No results found for:  PROLACTIN Lab Results  Component Value Date   CHOL 169 02/18/2018   TRIG 113 02/18/2018   HDL 43 02/18/2018   CHOLHDL 3.9 02/18/2018   VLDL 23 02/18/2018   LDLCALC 103 (H) 02/18/2018   LDLCALC 102 (H) 08/18/2011    Physical Findings: AIMS: Facial and Oral Movements Muscles of Facial Expression: None, normal Lips and Perioral Area: None, normal Jaw: None, normal Tongue: None, normal,Extremity Movements Upper (arms, wrists, hands, fingers): None, normal Lower (legs, knees, ankles, toes): None, normal, Trunk Movements Neck, shoulders, hips: None, normal, Overall Severity Severity of abnormal movements (highest score from questions above): None, normal Incapacitation due to abnormal movements: None, normal Patient's awareness of abnormal movements (rate only patient's report): No Awareness, Dental Status Current problems with teeth and/or dentures?: No Does patient usually wear dentures?: No  CIWA:  CIWA-Ar Total: 0 COWS:  COWS Total Score: 0  Musculoskeletal: Strength & Muscle Tone: within normal limits Gait & Station: normal Patient leans: none  Psychiatric Specialty Exam: Physical Exam  Nursing note and vitals reviewed. Constitutional: She is oriented to person, place, and time. She appears well-developed and well-nourished.  HENT:  Head: Normocephalic and atraumatic.  Respiratory: Effort normal.  Neurological: She is alert and oriented to person, place, and time.    ROS   Blood pressure 119/88, pulse 88, temperature 98.8 F (37.1 C), temperature source Oral, resp. rate 16, height 5' 2.5" (1.588 m), weight (!) 142.9 kg, last  menstrual period 01/29/2013, SpO2 97 %.Body mass index is 56.7 kg/m.  General Appearance: Well Groomed  Eye Contact:  Good  Speech:  Clear and Coherent  Volume:  normal  Mood:  Euthymic and improving  Affect:  Appropriate  Thought Process:  Goal Directed, Linear and Descriptions of Associations: Intact  Orientation:  Full (Time, Place, and Person)  Thought Content:  WDL  Suicidal Thoughts:  Denies contracts for safety.   Homicidal Thoughts:  Denies  Memory:  Immediate;   Good Recent;   Good Remote;   Good  Judgement:  Intact  Insight:  Good  Psychomotor Activity:  improving  Concentration:  Concentration: Good and Attention Span: Good  Recall:  Good  Fund of Knowledge:  Good  Language:  Fair  Akathisia:  No  Handed:  Right  AIMS (if indicated):     Assets:  Communication Skills Desire for Improvement Financial Resources/Insurance Housing Intimacy Leisure Time Physical Health Resilience Social Support Talents/Skills  ADL's:  Intact  Cognition:  WNL  Sleep:  Number of Hours: 6.75     Treatment Plan Summary: Daily contact with patient to assess and evaluate symptoms and progress in treatment and Medication management  1. Will maintain Q 15 minutes observation for safety. Estimated LOS: 5-7 days 2. Patient will participate in group, milieu, and family therapy. Psychotherapy: Social and Doctor, hospital, anti-bullying, learning based strategies, cognitive behavioral, and family object relations individuation separation intervention psychotherapies can be considered.  3. Depression, improving continue with trintellix 20mg  po daily for mood and anxiety.   4. Mood disorder- Continue with Lamictal 25mg  po BID.  5.  Anxiety -Continue tapering from Klonopin 1.5mg  yesterday to 0.5mg  po BID.  6. Diabetes- metformin 500mg  po BID with meals.  7. Will continue daily management of other medical conditions at this time.  8. Will continue to monitor patient's mood and  behavior. 9. Social Work will schedule a Family meeting to obtain collateral information and discuss discharge and follow up plan. Discharge concerns will also be addressed: Safety, stabilization,  and access to medication  Maryagnes Amos, FNP 02/21/2018, 10:21 AM

## 2018-02-21 NOTE — Progress Notes (Signed)
D:  Patient's self inventory sheet, patient sleeps good, no sleep medication.  Good appetite, normal energy level, good concentration.  Denied depression, hopeless and anxiety.  Denied withdrawals.  Denied SI.  Denied physical problems.  Denied physical pain.  Goal is learn coping skills.  Plans to attend session.  Does have discharge plans. A:  Medications administered per MD orders.  Emotional support and encouragement given patient. R:  Denied SI and HI, contracts for safety.  Denied A/V hallucinations.  Safety maintained with 15 minute checks.
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
## 2018-02-21 NOTE — [facility] Group Notes (Signed)
[facility] Group Notes:  (Nursing/MHT/Case Management/Adjunct)  Date:  02/21/2018  Time:  5:54 PM  Type of Therapy:  Nurse Education  Participation Level:  Active  Participation Quality:  Appropriate and Attentive  Affect:  Appropriate  Cognitive:  Alert and Appropriate  Insight:  Good  Engagement in Group:  Engaged  Modes of Intervention:  Discussion and Education  Summary of Progress/Problems: In this group, we identified unhealthy behaviors and their root causes. We discussed unhealthy "stinkin' thinkin'" and its impact on our emotional state and self esteem. We considered ways to boost self-esteem, such as positive self-talk.  Kirstie Mirza 02/21/2018, 5:54 PM

## 2018-02-21 NOTE — Progress Notes (Signed)
Patient ID: Erika Woods, female   DOB: 1965/06/23, 52 y.o.   MRN: [id number] D: Patient observed talking to peers in the hallway. Pt mood and affect appears calm and cooperative. Pt reports groups has been very helpful for her since admission. Pt also describe her peers as supportive. Pt rated her day as 10 out of 10.Denies  SI/HI/AVH and pain.No behavioral issues noted.  A: Support and encouragement offered as needed to express needs. Medications administered as prescribed.  R: Patient is safe and cooperative on unit. Will continue to monitor  for safety and stability.

## 2018-02-21 NOTE — Plan of Care (Signed)
Nurse discussed anxiety, depression, coping skills with patient. 

## 2018-02-21 NOTE — Progress Notes (Signed)
Adult Psychoeducational Group Note  Date:  02/21/2018 Time:  8:56 PM  Group Topic/Focus:  Wrap-Up Group:   The focus of this group is to help patients review their daily goal of treatment and discuss progress on daily workbooks.  Participation Level:  Active  Participation Quality:  Appropriate  Affect:  Appropriate  Cognitive:  Appropriate  Insight: Appropriate  Engagement in Group:  Engaged  Modes of Intervention:  Discussion  Additional Comments:  Pt stated her goal was to continue learning coping skills and the goal was met.  Pt stated that she learned a lot today from group.  Pt rated the day at a 10/10.  Erika Woods 02/21/2018, 8:56 PM

## 2018-02-21 NOTE — [facility] Group Notes (Signed)
[facility] LCSW Group Therapy Note  02/21/2018  10:00-11:00AM  Type of Therapy and Topic:  Group Therapy:  Adding Supports Including Being Your Own Support  Participation Level:  Active   Description of Group:  Patients in this group were introduced to the concept that additional supports including self-support are an essential part of recovery.  A song entitled "I Need Help!" was played and a group discussion was held in reaction to the idea of needing to add supports.  A song entitled "My Own Hero" was played and a group discussion ensued in which patients stated they could relate to the song and it inspired them to realize they have be willing to help themselves in order to succeed, because other people cannot achieve sobriety or stability for them.  We discussed adding a variety of healthy supports to address the various needs in their lives.  A song was played called "I Know Where I've Been" toward the end of group and used to conduct an inspirational wrap-up to group of remembering how far they have already come in their journey.  Therapeutic Goals: 1)  demonstrate the importance of being a part of one's own support system 2)  discuss reasons people in one's life may eventually be unable to be continually supportive  3)  identify the patient's current support system and   4)  elicit commitments to add healthy supports and to become more conscious of being self-supportive   Summary of Patient Progress:  The patient expressed that her husband, daughters, counselor, church friends and other friends are healthy supports for her.  Today, just as yesterday, she remained focused on the one friend who said she was going to distance herself while pt is in treatment, and said this person is not a healthy support because she will only support her when she is well.  The group discussed different reasons that someone in our lives may be unable to be supportive at different points and she expressed understanding.    Therapeutic Modalities:   Motivational Interviewing Activity  Lynnell Chad

## 2018-02-22 MED ORDER — CLONAZEPAM 0.5 MG PO TABS: 0.5000 mg | ORAL_TABLET | Freq: Every day | ORAL | 0 refills | Status: DC

## 2018-02-22 MED ORDER — CLONAZEPAM 0.5 MG PO TABS: 0.5000 mg | ORAL_TABLET | Freq: Every day | ORAL | Status: DC

## 2018-02-22 MED ORDER — LAMOTRIGINE 25 MG PO TABS: 25.0000 mg | ORAL_TABLET | Freq: Two times a day (BID) | ORAL | 0 refills | Status: DC

## 2018-02-22 MED ORDER — VORTIOXETINE HBR 10 MG PO TABS: 20.0000 mg | ORAL_TABLET | Freq: Every day | ORAL | 0 refills | Status: DC

## 2018-02-22 NOTE — Progress Notes (Signed)
Recreation Therapy Notes  Date: 10.14.19 Time: 0930 Location: [address] Dayroom  Group Topic: Stress Management  Goal Area(s) Addresses:  Patient will verbalize importance of using healthy stress management.  Patient will identify positive emotions associated with healthy stress management.   Behavioral Response: Engaged  Intervention: Stress Management  Activity :  Guided Imagery.  LRT introduced patients to the stress management technique of guided imagery.    Education:  Stress Management, Discharge Planning.   Education Outcome: Acknowledges edcuation/In group clarification offered/Needs additional education  Clinical Observations/Feedback: Pt attended and participated in group.    Erika Woods, LRT/CTRS         Erika Woods A 02/22/2018 12:42 PM 

## 2018-02-22 NOTE — Progress Notes (Signed)
Pt attended morning goals/edu group.  

## 2018-02-22 NOTE — Progress Notes (Signed)
Discharge Note:  Patient discharged with family.   Patient denied SI and HI.  Denied A/V hallucinations.  Suicide prevention information given and discussed with patient.  Suicide prevention resources including My3 App given patient.  Patient stated she received all her belongings, clothing, toiletries, misc items, prescriptions, medications, etc.  Patient stated she appreciated all assistance received from [facility] staff.  Patient did complete her survey and put in appropriate black box.  All required discharge information given to patient at discharge.

## 2018-02-22 NOTE — Progress Notes (Signed)
  [facility] Adult Case Management Discharge Plan :  Will you be returning to the same living situation after discharge:  Yes,  patient reports she is returning home with her husband At discharge, do you have transportation home?: Yes,  patient reports her husband or her adult daughter will pick her up at discharge Do you have the ability to pay for your medications: Yes,  BCBS, income from employment, support from spouse  Release of information consent forms completed and in the chart;  Patient's signature needed at discharge.  Patient to Follow up at: Follow-up Information    Milagros Evener, MD. Go on 02/27/2018.   Specialty:  Psychiatry Why:  Please attend your medication appt on Saturday, 02/27/18, at 5:15pm. Contact information: [address] [place] [address] [phone]        Burney Gauze. Go on 02/23/2018.   Why:  Please attend your therapy appt with Britta Mccreedy on Tuesday, 02/23/18, at 4:00pm. Contact information: [address];  [place], [place] P: [phone]           Next level of care provider has access to [facility] Link:yes  Safety Planning and Suicide Prevention discussed: Yes,  with the patient's husband  Have you used any form of tobacco in the last 30 days? (Cigarettes, Smokeless Tobacco, Cigars, and/or Pipes): No  Has patient been referred to the Quitline?: N/A patient is not a smoker  Patient has been referred for addiction treatment: N/A  Maeola Sarah, LCSWA 02/22/2018, 10:24 AM

## 2018-02-22 NOTE — Progress Notes (Signed)
D:  Patient's self inventory sheet, patient sleeps good, no sleep medication given.  Good appetite, normal energy level, good concentration.  Denied depression, hopeless and anxiety.  Withdrawals, did not check any information.  Denied SI.  Denied physical problems.  Denied physical pain.  Goal is discharge.  Plans to discharge.  Does have discharge plans. A:  Medications administered per MD orders.  Emotional support and encouragement given patient. R:  Denied SI and HI, contracts for safety.  Denied A/V hallucinations.  Safety maintained with 15 minute checks.

## 2018-02-22 NOTE — [facility] Suicide Risk Assessment (Signed)
[facility] Discharge Suicide Risk Assessment   Principal Problem: <principal problem not specified> Discharge Diagnoses:  Patient Active Problem List   Diagnosis Date Noted  . Severe recurrent major depression without psychotic features ([name]) [F33.2] 02/17/2018  . Obesity, Class III, BMI 40-49.9 (morbid obesity) ([name]) [E66.01] 09/16/2017  . Acute pulmonary embolism with acute cor pulmonale ([name]) [I26.09] 09/16/2017  . Acute superficial venous thrombosis of left lower extremity [I82.812]   . Thoracic aortic aneurysm without rupture ([name]) [I71.2]   . Pulmonary emboli ([name]) [I26.99] 09/15/2017  . PTSD (post-traumatic stress disorder) [F43.10] 08/18/2011  . DM (diabetes mellitus) ([name]) [E11.9] 08/18/2011  . Sleep apnea [G47.30] 08/18/2011  . Mood disorder ([name]) [F39] 08/18/2011    Total Time spent with patient: 15 minutes  Musculoskeletal: Strength & Muscle Tone: within normal limits Gait & Station: normal Patient leans: N/A  Psychiatric Specialty Exam: Review of Systems  All other systems reviewed and are negative.   Blood pressure 136/88, pulse 81, temperature 98.2 F (36.8 C), temperature source Oral, resp. rate 20, height 5' 2.5" (1.588 m), weight (!) 142.9 kg, last menstrual period 01/29/2013, SpO2 97 %.Body mass index is 56.7 kg/m.  General Appearance: Casual  Eye Contact::  Good  Speech:  Normal Rate409  Volume:  Normal  Mood:  Euthymic  Affect:  Congruent  Thought Process:  Coherent and Descriptions of Associations: Intact  Orientation:  Full (Time, Place, and Person)  Thought Content:  Logical  Suicidal Thoughts:  No  Homicidal Thoughts:  No  Memory:  Immediate;   Fair Recent;   Fair Remote;   Fair  Judgement:  Intact  Insight:  Fair  Psychomotor Activity:  Normal  Concentration:  Good  Recall:  Good  Fund of Knowledge:Good  Language: Good  Akathisia:  Negative  Handed:  Right  AIMS (if indicated):     Assets:  Communication Skills Desire for  Improvement Financial Resources/Insurance Housing Intimacy Leisure Time Physical Health Resilience Social Support  Sleep:  Number of Hours: 6.25  Cognition: WNL  ADL's:  Intact   Mental Status Per Nursing Assessment::   On Admission:  NA  Demographic Factors:  Caucasian  Loss Factors: NA  Historical Factors: Impulsivity  Risk Reduction Factors:   Sense of responsibility to family, Employed, Living with another person, especially a relative, Positive social support and Positive therapeutic relationship  Continued Clinical Symptoms:  Depression:   Impulsivity  Cognitive Features That Contribute To Risk:  None    Suicide Risk:  Minimal: No identifiable suicidal ideation.  Patients presenting with no risk factors but with morbid ruminations; may be classified as minimal risk based on the severity of the depressive symptoms  Follow-up Information    Milagros Evener, MD. Go on 02/27/2018.   Specialty:  Psychiatry Why:  Please attend your medication appt on Saturday, 02/27/18, at 5:15pm. Contact information: [address] [place] [address] [phone]        Burney Gauze. Go on 02/23/2018.   Why:  Please attend your therapy appt with Britta Mccreedy on Tuesday, 02/23/18, at 4:00pm. Contact information: [address];  [place], [place] P: [phone]           Plan Of Care/Follow-up recommendations:  Activity:  ad lib  Antonieta Pert, MD 02/22/2018, 7:48 AM

## 2018-02-22 NOTE — Discharge Summary (Signed)
Physician Discharge Summary Note  Patient:  Erika Woods is an 52 y.o., female MRN:  [id number] DOB:  [birth year] Patient phone:  [phone] (home)  Patient address:   [place] [address],  Total Time spent with patient: 30 minutes  Date of Admission:  02/17/2018 Date of Discharge: 02/22/2018  Reason for Admission:  Intentional overdose  Principal Problem: PTSD (post-traumatic stress disorder) Discharge Diagnoses: Patient Active Problem List   Diagnosis Date Noted  . PTSD (post-traumatic stress disorder) [F43.10] 08/18/2011    Priority: High  . Severe recurrent major depression without psychotic features ([place]) [F33.2] 02/17/2018  . Obesity, Class III, BMI 40-49.9 (morbid obesity) ([place]) [E66.01] 09/16/2017  . Acute pulmonary embolism with acute cor pulmonale ([place]) [I26.09] 09/16/2017  . Acute superficial venous thrombosis of left lower extremity [I82.812]   . Thoracic aortic aneurysm without rupture ([place]) [I71.2]   . Pulmonary emboli ([place]) [I26.99] 09/15/2017  . DM (diabetes mellitus) ([place]) [E11.9] 08/18/2011  . Sleep apnea [G47.30] 08/18/2011  . Mood disorder ([place]) [F39] 08/18/2011    Past Psychiatric History: depression, PTSD, bipolar d/o  Past Medical History:  Past Medical History:  Diagnosis Date  . Diabetes mellitus without complication ([place])   . Impaired fasting glucose   . Obstructive sleep apnea on CPAP   . Osteoarthritis of knee   . Personal history of pulmonary embolism   . Renal disorder    kideny stones  . Restless leg syndrome   . Thoracic aortic aneurysm [facility])     Past Surgical History:  Procedure Laterality Date  . arm surgery Bilateral Skin removal  . DILATION AND EVACUATION    . KIDNEY STONE SURGERY    . KNEE SURGERY    . TONSILLECTOMY     Family History:  Family History  Adopted: Yes   Family Psychiatric  History: none Social History:  Social History   Substance and Sexual Activity  Alcohol Use Yes   Comment:  special occassional - mixed drinks     Social History   Substance and Sexual Activity  Drug Use No    Social History   Socioeconomic History  . Marital status: Married    Spouse name: Not on file  . Number of children: 2  . Years of education: Not on file  . Highest education level: Not on file  Occupational History  . Occupation: Aeronautical engineer  Social Needs  . Financial resource strain: Not on file  . Food insecurity:    Worry: Not on file    Inability: Not on file  . Transportation needs:    Medical: Not on file    Non-medical: Not on file  Tobacco Use  . Smoking status: Never Smoker  . Smokeless tobacco: Never Used  Substance and Sexual Activity  . Alcohol use: Yes    Comment: special occassional - mixed drinks  . Drug use: No  . Sexual activity: Not on file  Lifestyle  . Physical activity:    Days per week: Not on file    Minutes per session: Not on file  . Stress: Not on file  Relationships  . Social connections:    Talks on phone: Not on file    Gets together: Not on file    Attends religious service: Not on file    Active member of club or organization: Not on file    Attends meetings of clubs or organizations: Not on file    Relationship status: Not on file  Other Topics Concern  .  Not on file  Social History Narrative  . Not on file    Hospital Course:  On admission 02/17/2018:  Patient is seen and examined. Patient is a 52 year old female with a past psychiatric history significant for major depression, generalized anxiety, posttraumatic stress disorder and some Axis II components who presented to the [facility] emergency department on 02/17/2018 after an intentional overdose of Klonopin. Patient stated that she had been recommended to go to an evening intensive outpatient program by her psychiatrist Dr. Toy Care.The patient had presented last week and it was felt to be a good situation for her to begin. She presented Tuesday, and  apparently there was some conflict and interest between her previous therapist and the person who is doing therapy at the IOP. The patient stated also that the husband of the IOP therapist worked at [employer], and the husband of the patient also worked at a [employer]. She stated that it taken her a great deal of energy to get up encouraged to go to the IOP. When she was turned down for this she became very upset. She stayed in her car for 30 minutes to an hour prior to going home. When she got home she was angry, upset and frustrated. She took the overdose of Klonopin. She apparently took 20 -1 mg tablets. She was stabilized in the emergency room and transferred to our facility for evaluation and stabilization. Patient currently states that she does not want to die, and was just frustrated and angry over this. She stated that she felt like she was ready to go home. Unfortunately this is the second overdose of Klonopin and less than 30 days. On 9/10 she had also overdosed on Klonopin and a similar set of circumstances. She stated that over the last several weeks she has been transition from [place] to Trintellix. She is currently on Trintellix 20 mg a day. She is tearful and upset today. But denied suicidality. She was admitted to the hospital for evaluation and stabilization.  Medications:  Continue Trintellix 20 mg daily for depression, Klonopin 1 mg BID continued for anxiety, and Lamictal 25 mg BID for mood stabilization  02/18/2018:  Patient is seen and examined.  Patient is a 52 year old female with a past psychiatric history significant for major depression, generalized anxiety, posttraumatic stress disorder and some Axis II components.  She is seen in follow-up.  Initially she stated she was doing well.  She states she felt better from yesterday.  She stated her thinking was clear.  We discussed the fact that she would most likely remain in the hospital over the weekend given  the significance of her 2 recent overdoses.  She became significantly tearful after that.  We discussed that short period of time that she been on the Trintellix, and the hope that it would continue to kick and it be beneficial for her depression.  She denied any current suicidal ideation.  Medications:  No changes  02/19/2018:  Patient is seen and examined.  Patient is a 52 year old female with a past psychiatric history significant for major depression, generalized anxiety, posttraumatic stress disorder as well as some Axis II components.  She is seen in follow-up.  She is doing better today.  She was upset yesterday because she was not being discharged.  She was tearful throughout the afternoon.  Her counselor as an outpatient came in and spent 40 minutes with her, and she felt better about this.  I spoke to her outpatient psychiatrist last  evening, and she was in agreement with weaning off of the Klonopin.  The patient and I discussed that this morning.  She denied any current suicidal ideation.  We discussed follow-up after hospitalization including potentially going to [facility] [place] for possible inpatient treatment.  She denied any gross suicidal ideation this morning.   Medications:  Start Klonopin taper off  02/20/2018:  Patient is seen and examined.  Patient is a 52 year old female with a past psychiatric history significant for major depression, generalized anxiety disorder, posttraumatic stress disorder as well as some Axis II components.  She is seen in follow-up.  She is doing better today.  She stated that on the first day of admission when she found out she was not going to be able to go home she was tearful all day.  She stated that after we met yesterday she was able to compensate and do well.  She was upset she was going to be unable to go to the reunion, but she understood what was going on.  She also understood the need for reduction and elimination of the  benzodiazepines.  She is talked to social work about potentially going [facility] [place] after her discharge.  She stated she slept well last night, and denied any suicidal ideation.  Vital signs are stable, she is afebrile.  Heart rates 94.  We talked about her taper off the benzodiazepines in detail.  Medications:  Klonopin 1 mg from BID to daily for anxiety, taper off  02/21/2018:  Subjective: Doing good at this moment. We had a great group sessions yesterday and it offered so much more than before. We talked about putting our thoughts on trial, and taking our stressful events and listing them down in different ways. We learned about overgeneralization and blaming.   Objective: Patient is seen and examined.  Patient is a 52 year old female with a past psychiatric history significant for major depression, generalized anxiety disorder, posttraumatic stress disorder as well as some Axis II components. Patient is seen and case discussed with MD. Patient is alert and oriented today, she is also clam and cooperative. She is observed completing a puzzle with a peer. She is very engaged with Probation officer, and is able to offer much insight and improved judgement. She is able to identify her reasons that lead to her admission, things she can do to prevent a relapse. She reports her goal today is to continue working on cognitive learning and speaking in the moment. She is able to safely identify her plans for discharge, at this time. She is being weaned off Klonopin, and denies any urges, withdrawal symptoms or cravings at this time. She denies any depressive symptoms and identifies coping skills. She denies any suicidal ideations at this time.   Medications:  Klonopin 0.5 mg BID, tapering off  02/22/2018:  Patient has met maximum benefit from hospitalization.  No suicidal/homicidal ideations, hallucinations, or substance abuse.  Patient was provided with discharge instructions and explanations along with  behavioral health appointment, Rx, and crisis numbers.  Stable for discharge.  Physical Findings: AIMS: Facial and Oral Movements Muscles of Facial Expression: None, normal Lips and Perioral Area: None, normal Jaw: None, normal Tongue: None, normal,Extremity Movements Upper (arms, wrists, hands, fingers): None, normal Lower (legs, knees, ankles, toes): None, normal, Trunk Movements Neck, shoulders, hips: None, normal, Overall Severity Severity of abnormal movements (highest score from questions above): None, normal Incapacitation due to abnormal movements: None, normal Patient's awareness of  abnormal movements (rate only patient's report): No Awareness, Dental Status Current problems with teeth and/or dentures?: No Does patient usually wear dentures?: No  CIWA:  CIWA-Ar Total: 1 COWS:  COWS Total Score: 2  Musculoskeletal: Strength & Muscle Tone: within normal limits Gait & Station: normal Patient leans: N/A  Psychiatric Specialty Exam: Review of Systems  All other systems reviewed and are negative.   Blood pressure 136/88, pulse 81, temperature 98.2 F (36.8 C), temperature source Oral, resp. rate 20, height 5' 2.5" (1.588 m), weight (!) 142.9 kg, last menstrual period 01/29/2013, SpO2 97 %.Body mass index is 56.7 kg/m.  General Appearance: Casual  Eye Contact::  Good  Speech:  Normal Rate409  Volume:  Normal  Mood:  Euthymic  Affect:  Congruent  Thought Process:  Coherent and Descriptions of Associations: Intact  Orientation:  Full (Time, Place, and Person)  Thought Content:  Logical  Suicidal Thoughts:  No  Homicidal Thoughts:  No  Memory:  Immediate;   Fair Recent;   Fair Remote;   Fair  Judgement:  Intact  Insight:  Fair  Psychomotor Activity:  Normal  Concentration:  Good  Recall:  Good  Fund of Knowledge:Good  Language: Good  Akathisia:  Negative  Handed:  Right  AIMS (if indicated):     Assets:  Communication Skills Desire for Improvement Financial  Resources/Insurance Housing Intimacy Leisure Time Physical Health Resilience Social Support  Sleep:  Number of Hours: 6.25  Cognition: WNL  ADL's:  Intact    Have you used any form of tobacco in the last 30 days? (Cigarettes, Smokeless Tobacco, Cigars, and/or Pipes): No  Has this patient used any form of tobacco in the last 30 days? (Cigarettes, Smokeless Tobacco, Cigars, and/or Pipes)  Yes, A prescription for an FDA-approved tobacco cessation medication was offered at discharge and the patient refused  Blood Alcohol level:  Lab Results  Component Value Date   [facility] <10 02/16/2018   ETH <10 08/67/6195    Metabolic Disorder Labs:  Lab Results  Component Value Date   HGBA1C 5.7 (H) 02/18/2018   MPG 116.89 02/18/2018   No results found for: PROLACTIN Lab Results  Component Value Date   CHOL 169 02/18/2018   TRIG 113 02/18/2018   HDL 43 02/18/2018   CHOLHDL 3.9 02/18/2018   VLDL 23 02/18/2018   LDLCALC 103 (H) 02/18/2018   LDLCALC 102 (H) 08/18/2011    See Psychiatric Specialty Exam and Suicide Risk Assessment completed by Attending Physician prior to discharge.  Discharge destination:  Home  Is patient on multiple antipsychotic therapies at discharge:  No   Has Patient had three or more failed trials of antipsychotic monotherapy by history:  No  Recommended Plan for Multiple Antipsychotic Therapies: NA  Discharge Instructions    Diet - low sodium heart healthy   Complete by:  As directed    Discharge instructions   Complete by:  As directed    Follow up with appointment this week   Increase activity slowly   Complete by:  As directed       Follow-up Information    Chucky May, MD. Go on 02/27/2018.   Specialty:  Psychiatry Why:  Please attend your medication appt on Saturday, 02/27/18, at 5:15pm. Contact information: [address] [place] [address] [phone]        Carney Bern. Go on 02/23/2018.   Why:   Please attend your therapy appt with Pamala Hurry on Tuesday, 02/23/18, at 4:00pm.  Contact information: [address];  [place], [place] P: [phone]           Follow-up recommendations:  Activity:  as tolerated Diet:  hearthe healthy diet  Comments:  Follow up with appointments above  Signed: Waylan Boga, NP 02/22/2018, 11:20 AM

## 2018-02-22 NOTE — Discharge Instructions (Signed)
Living With Depression Everyone experiences occasional disappointment, sadness, and loss in their lives. When you are feeling down, blue, or sad for at least 2 weeks in a row, it may mean that you have depression. Depression can affect your thoughts and feelings, relationships, daily activities, and physical health. It is caused by changes in the way your brain functions. If you receive a diagnosis of depression, your health care provider will tell you which type of depression you have and what treatment options are available to you. If you are living with depression, there are ways to help you recover from it and also ways to prevent it from coming back. How to cope with lifestyle changes Coping with stress Stress is your body's reaction to life changes and events, both good and bad. Stressful situations may include:  Getting married.  The death of a spouse.  Losing a job.  Retiring.  Having a baby.  Stress can last just a few hours or it can be ongoing. Stress can play a major role in depression, so it is important to learn both how to cope with stress and how to think about it differently. Talk with your health care provider or a counselor if you would like to learn more about stress reduction. He or she may suggest some stress reduction techniques, such as:  Music therapy. This can include creating music or listening to music. Choose music that you enjoy and that inspires you.  Mindfulness-based meditation. This kind of meditation can be done while sitting or walking. It involves being aware of your normal breaths, rather than trying to control your breathing.  Centering prayer. This is a kind of meditation that involves focusing on a spiritual word or phrase. Choose a word, phrase, or sacred image that is meaningful to you and that brings you peace.  Deep breathing. To do this, expand your stomach and inhale slowly through your nose. Hold your breath for 3-5 seconds, then exhale  slowly, allowing your stomach muscles to relax.  Muscle relaxation. This involves intentionally tensing muscles then relaxing them.  Choose a stress reduction technique that fits your lifestyle and personality. Stress reduction techniques take time and practice to develop. Set aside 5-15 minutes a day to do them. Therapists can offer training in these techniques. The training may be covered by some insurance plans. Other things you can do to manage stress include:  Keeping a stress diary. This can help you learn what triggers your stress and ways to control your response.  Understanding what your limits are and saying no to requests or events that lead to a schedule that is too full.  Thinking about how you respond to certain situations. You may not be able to control everything, but you can control how you react.  Adding humor to your life by watching funny films or TV shows.  Making time for activities that help you relax and not feeling guilty about spending your time this way.  Medicines Your health care provider may suggest certain medicines if he or she feels that they will help improve your condition. Avoid using alcohol and other substances that may prevent your medicines from working properly (may interact). It is also important to:  Talk with your pharmacist or health care provider about all the medicines that you take, their possible side effects, and what medicines are safe to take together.  Make it your goal to take part in all treatment decisions (shared decision-making). This includes giving input on the side   effects of medicines. It is best if shared decision-making with your health care provider is part of your total treatment plan.  If your health care provider prescribes a medicine, you may not notice the full benefits of it for 4-8 weeks. Most people who are treated for depression need to be on medicine for at least 6-12 months after they feel better. If you are taking  medicines as part of your treatment, do not stop taking medicines without first talking to your health care provider. You may need to have the medicine slowly decreased (tapered) over time to decrease the risk of harmful side effects. Relationships Your health care provider may suggest family therapy along with individual therapy and drug therapy. While there may not be family problems that are causing you to feel depressed, it is still important to make sure your family learns as much as they can about your mental health. Having your family's support can help make your treatment successful. How to recognize changes in your condition Everyone has a different response to treatment for depression. Recovery from major depression happens when you have not had signs of major depression for two months. This may mean that you will start to:  Have more interest in doing activities.  Feel less hopeless than you did 2 months ago.  Have more energy.  Overeat less often, or have better or improving appetite.  Have better concentration.  Your health care provider will work with you to decide the next steps in your recovery. It is also important to recognize when your condition is getting worse. Watch for these signs:  Having fatigue or low energy.  Eating too much or too little.  Sleeping too much or too little.  Feeling restless, agitated, or hopeless.  Having trouble concentrating or making decisions.  Having unexplained physical complaints.  Feeling irritable, angry, or aggressive.  Get help as soon as you or your family members notice these symptoms coming back. How to get support and help from others How to talk with friends and family members about your condition Talking to friends and family members about your condition can provide you with one way to get support and guidance. Reach out to trusted friends or family members, explain your symptoms to them, and let them know that you are  working with a health care provider to treat your depression. Financial resources Not all insurance plans cover mental health care, so it is important to check with your insurance carrier. If paying for co-pays or counseling services is a problem, search for a local or county mental health care center. They may be able to offer public mental health care services at low or no cost when you are not able to see a private health care provider. If you are taking medicine for depression, you may be able to get the generic form, which may be less expensive. Some makers of prescription medicines also offer help to patients who cannot afford the medicines they need. Follow these instructions at home:  Get the right amount and quality of sleep.  Cut down on using caffeine, tobacco, alcohol, and other potentially harmful substances.  Try to exercise, such as walking or lifting small weights.  Take over-the-counter and prescription medicines only as told by your health care provider.  Eat a healthy diet that includes plenty of vegetables, fruits, whole grains, low-fat dairy products, and lean protein. Do not eat a lot of foods that are high in solid fats, added sugars, or salt.    Keep all follow-up visits as told by your health care provider. This is important. Contact a health care provider if:  You stop taking your antidepressant medicines, and you have any of these symptoms: ? Nausea. ? Headache. ? Feeling lightheaded. ? Chills and body aches. ? Not being able to sleep (insomnia).  You or your friends and family think your depression is getting worse. Get help right away if:  You have thoughts of hurting yourself or others. If you ever feel like you may hurt yourself or others, or have thoughts about taking your own life, get help right away. You can go to your nearest emergency department or call:  Your local emergency services (911 in the [place]  A suicide crisis helpline, such as the  National Suicide Prevention Lifeline at 1-800-273-8255. This is open 24-hours a day.  Summary  If you are living with depression, there are ways to help you recover from it and also ways to prevent it from coming back.  Work with your health care team to create a management plan that includes counseling, stress management techniques, and healthy lifestyle habits. This information is not intended to replace advice given to you by your health care provider. Make sure you discuss any questions you have with your health care provider. Document Released: 03/31/2016 Document Revised: 03/31/2016 Document Reviewed: 03/31/2016 Elsevier Interactive Patient Education  2018 Elsevier Inc.  

## 2018-03-02 ENCOUNTER — Telehealth (HOSPITAL_COMMUNITY): Payer: Self-pay | Admitting: Professional

## 2018-03-03 ENCOUNTER — Other Ambulatory Visit (HOSPITAL_COMMUNITY): Payer: BLUE CROSS/BLUE SHIELD

## 2018-03-31 ENCOUNTER — Telehealth (HOSPITAL_COMMUNITY): Payer: Self-pay | Admitting: Professional

## 2018-03-31 NOTE — Telephone Encounter (Signed)
Carmella from [facility] called to schedule an ax for pt for f/u care after d/c. Pt will d/c from residential tomorrow, 11/20. Pt scheduled for ax on Thursday, 11/21 @ 2:30. Cln asked Carmella to tell pt to arrive at 1:30 for pw for a 2:30 appt. Carmella agrees. Address, phone, and fax provided.

## 2018-04-01 ENCOUNTER — Other Ambulatory Visit (HOSPITAL_COMMUNITY): Payer: BLUE CROSS/BLUE SHIELD

## 2018-04-18 DIAGNOSIS — F329 Major depressive disorder, single episode, unspecified: Secondary | ICD-10-CM | POA: Insufficient documentation

## 2018-05-10 ENCOUNTER — Other Ambulatory Visit: Payer: BLUE CROSS/BLUE SHIELD

## 2018-05-10 ENCOUNTER — Other Ambulatory Visit: Payer: Self-pay | Admitting: *Deleted

## 2018-05-10 DIAGNOSIS — I712 Thoracic aortic aneurysm, without rupture, unspecified: Secondary | ICD-10-CM

## 2018-05-11 ENCOUNTER — Ambulatory Visit (INDEPENDENT_AMBULATORY_CARE_PROVIDER_SITE_OTHER)
Admission: RE | Admit: 2018-05-11 | Discharge: 2018-05-11 | Disposition: A | Discharge status: Home / Self Care | Payer: BLUE CROSS/BLUE SHIELD | Source: Ambulatory Visit | Attending: Cardiovascular Disease | Admitting: Cardiovascular Disease

## 2018-05-11 ENCOUNTER — Inpatient Hospital Stay: Admission: RE | Admit: 2018-05-11 | Payer: BLUE CROSS/BLUE SHIELD | Source: Ambulatory Visit

## 2018-05-11 DIAGNOSIS — I712 Thoracic aortic aneurysm, without rupture, unspecified: Secondary | ICD-10-CM

## 2018-05-11 LAB — BASIC METABOLIC PANEL
BUN/Creatinine Ratio: 13 (ref 9–23)
BUN: 14 mg/dL (ref 6–24)
CO2: 22 mmol/L (ref 20–29)
CREATININE: 1.07 mg/dL — AB (ref 0.57–1.00)
Calcium: 9 mg/dL (ref 8.7–10.2)
Chloride: 102 mmol/L (ref 96–106)
GFR calc Af Amer: 68 mL/min/{1.73_m2} (ref >59)
GFR calc non Af Amer: 59 mL/min/{1.73_m2} — ABNORMAL LOW (ref >59)
Glucose: 123 mg/dL — ABNORMAL HIGH (ref 65–99)
Potassium: 4.5 mmol/L (ref 3.5–5.2)
Sodium: 141 mmol/L (ref 134–144)

## 2018-05-11 MED ORDER — IOPAMIDOL (ISOVUE-370) INJECTION 76%
100.0000 mL | Freq: Once | INTRAVENOUS | Status: AC | PRN
  Administered 2018-05-11: 100 mL via INTRAVENOUS

## 2018-05-14 ENCOUNTER — Telehealth: Payer: Self-pay | Admitting: Cardiovascular Disease

## 2018-05-14 ENCOUNTER — Other Ambulatory Visit: Payer: BLUE CROSS/BLUE SHIELD

## 2018-05-14 DIAGNOSIS — I712 Thoracic aortic aneurysm, without rupture, unspecified: Secondary | ICD-10-CM

## 2018-05-14 NOTE — Telephone Encounter (Signed)
I spoke with pt and told her Dr. Clifton James would review scan when he was back in office on Monday and I would call her with results.

## 2018-05-14 NOTE — Telephone Encounter (Signed)
New Message   Pt calling to check on her CT results. Please call

## 2018-05-17 ENCOUNTER — Inpatient Hospital Stay: Admission: RE | Admit: 2018-05-17 | Payer: BLUE CROSS/BLUE SHIELD | Source: Ambulatory Visit

## 2018-05-17 NOTE — Addendum Note (Signed)
Addended by: Dossie Arbour on: 05/17/2018 09:27 AM   Modules accepted: Orders

## 2018-05-17 NOTE — Telephone Encounter (Signed)
Notes recorded by Kathleene Hazel, MD on 05/16/2018 at 1:41 PM EST Her aortic root is slightly enlarged and was read as similar to prior study with possible small increase in size. Recs for one year follow up. cdm  I spoke with pt and reviewed results with her.  She reports she has been having trouble with wheezing recently.  I asked her to follow up with primary care for this.

## 2018-06-08 ENCOUNTER — Other Ambulatory Visit: Payer: Self-pay | Admitting: Gastroenterology

## 2018-06-08 DIAGNOSIS — K7469 Other cirrhosis of liver: Secondary | ICD-10-CM

## 2018-06-08 NOTE — H&P (Signed)
Patient name Erika Woods, Erika Woods DICTATION# [id number] CSN# [id number]  Richardean Chimera, MD 06/08/2018 6:53 PM

## 2018-06-09 NOTE — H&P (Signed)
NAME: Erika Woods, Erika Woods MEDICAL RECORD [id number] ACCOUNT [id number] DATE OF BIRTH:Mar 31, 1966 FACILITY: [facility] LOCATION: [facility] PHYSICIAN:Nidhi Jacome Lisbeth Ply, MD  HISTORY AND PHYSICAL  DATE OF ADMISSION:  06/11/2018  HISTORY OF PRESENT ILLNESS:  The patient is a 53 year old gravida 3, para 3, postmenopausal patient who comes in for a hysteroscopy and D and C. She has been having very heavy periods over the past year.  The last one in November lasted 9 days.  Her  hemoglobin was stable.  Previous saline infusion ultrasound and endometrial sampling in 2017 were unremarkable.  We did an ultrasound in the office.  She had no definite uterine masses, but she had a markedly thickened endometrium.  Ovaries were not  visualized.  Because of her size, we had difficulty in performing a saline infusion.  Because of this, we are going to bring her at the present time to do a hysteroscopy and D and C.  ALLERGIES:  SHE IS ALLERGIC TO SULFA.  MEDICATIONS:  She is on metformin, ropinirole 4 mg daily, lamotrigine 100 mg daily, Trintellix 10 mg daily, trazodone 150 mg daily.  PAST MEDICAL HISTORY:  She is being managed for diabetes and hypertension.  She has a history of a benign heart murmur.  PAST SURGICAL HISTORY:  She has had arm lift surgery, she has had 2 vaginal deliveries, and she has had her tonsils and adenoids out.  SOCIAL HISTORY:  Reveals no tobacco or alcohol use.  FAMILY HISTORY:  Noncontributory.  REVIEW OF SYSTEMS:  Noncontributory.  PHYSICAL EXAMINATION: VITAL SIGNS:  Afebrile, stable vital signs. HEENT:  The patient is normocephalic.  Pupils equal, round, react to light and accommodation.  Extraocular movements are intact.  Sclerae and conjunctivae are clear.  Oropharynx clear. LUNGS:  Clear. HEART:  Regular rhythm and rate without murmurs or gallops. ABDOMEN:  Benign.  No masses, organomegaly or tenderness. PELVIC:  Normal external genitalia.  Vaginal mucosa is clear.  Cervix  unremarkable.  Uterus normal size, shape and contour.  Adnexa free of masses or tenderness. EXTREMITIES:  Trace edema. NEUROLOGIC:  Grossly within normal limits.  ASSESSMENT: 1.  Postmenopausal bleeding with evidence of increasing endometrial thickness. 2.  Diabetes. 3.  Hypertension.  PLAN:  The patient to undergo hysteroscopy with D and C. The nature of the procedure has been discussed and the risks explained.  The risks include the risk of infection, risk of hemorrhage that could require transfusion with the risk of AIDS or  hepatitis, risk of injury to adjacent organs such as bowel and bladder that could require further exploratory surgery, risk of deep venous thrombosis and pulmonary embolus.  The patient expressed understanding of indications and risks.  LN/NUANCE  D:06/08/2018 T:06/08/2018 JOB:[id number]

## 2018-06-10 ENCOUNTER — Encounter (HOSPITAL_COMMUNITY)
Admission: RE | Admit: 2018-06-10 | Discharge: 2018-06-10 | Disposition: A | Discharge status: Home / Self Care | Payer: BLUE CROSS/BLUE SHIELD | Source: Ambulatory Visit | Attending: Obstetrics and Gynecology | Admitting: Obstetrics and Gynecology

## 2018-06-10 ENCOUNTER — Other Ambulatory Visit: Payer: Self-pay

## 2018-06-10 ENCOUNTER — Encounter (HOSPITAL_COMMUNITY): Payer: Self-pay

## 2018-06-10 DIAGNOSIS — Z01812 Encounter for preprocedural laboratory examination: Secondary | ICD-10-CM | POA: Diagnosis not present

## 2018-06-10 LAB — BASIC METABOLIC PANEL
Anion gap: 8 (ref 5–15)
BUN: 17 mg/dL (ref 6–20)
CALCIUM: 9 mg/dL (ref 8.9–10.3)
CO2: 24 mmol/L (ref 22–32)
CREATININE: 1.16 mg/dL — AB (ref 0.44–1.00)
Chloride: 107 mmol/L (ref 98–111)
GFR calc Af Amer: >60 mL/min (ref >60)
GFR calc non Af Amer: 54 mL/min — ABNORMAL LOW (ref >60)
Glucose, Bld: 113 mg/dL — ABNORMAL HIGH (ref 70–99)
Potassium: 4.3 mmol/L (ref 3.5–5.1)
Sodium: 139 mmol/L (ref 135–145)

## 2018-06-10 LAB — CBC
HCT: 39.1 % (ref 36.0–46.0)
Hemoglobin: 11.8 g/dL — ABNORMAL LOW (ref 12.0–15.0)
MCH: 26.9 pg (ref 26.0–34.0)
MCHC: 30.2 g/dL (ref 30.0–36.0)
MCV: 89.3 fL (ref 80.0–100.0)
Platelets: 237 10*3/uL (ref 150–400)
RBC: 4.38 MIL/uL (ref 3.87–5.11)
RDW: 14.1 % (ref 11.5–15.5)
WBC: 7.8 10*3/uL (ref 4.0–10.5)
nRBC: 0 % (ref 0.0–0.2)

## 2018-06-10 LAB — GLUCOSE, CAPILLARY: Glucose-Capillary: 101 mg/dL — ABNORMAL HIGH (ref 70–99)

## 2018-06-10 LAB — HEMOGLOBIN A1C
Hgb A1c MFr Bld: 6.5 % — ABNORMAL HIGH (ref 4.8–5.6)
Mean Plasma Glucose: 139.85 mg/dL

## 2018-06-10 NOTE — Progress Notes (Signed)
PCP - Nadyne Coombes Cardiologist - Dr. Clifton James  Chest x-ray - 02/06/2018 EKG - 02/17/2018; notified Fayrene Fearing, [place] of abnormal EKG Stress Test - patient denies ECHO - 02/03/2018 Cardiac Cath - patient denies  Sleep Study - 2013, patient states that the place where she had it done is no longer in existance CPAP - yes  Fasting Blood Sugar - unsure Checks Blood Sugar 0 times a day  Blood Thinner Instructions:n/a Aspirin Instructions: n/a   Anesthesia review: yes. Notified James of Echo and abnormal EKG  Patient denies shortness of breath, fever, cough and chest pain at PAT appointment   Patient verbalized understanding of instructions that were given to them at the PAT appointment. Patient was also instructed that they will need to review over the PAT instructions again at home before surgery.

## 2018-06-10 NOTE — Pre-Procedure Instructions (Signed)
Erika Woods  06/10/2018      [id number] - [place], [place] - [address] AT [address] OF [facility] [address] [place] [address] Phone: [phone] Fax: [phone]    Your procedure is scheduled on January 31  Report to [facility] at 0600 A.M.  Call this number if you have problems the morning of surgery:  [phone]   Remember:  Do not eat after midnight.  You may drink clear liquids until 0430 am the morning of surgery .  Clear liquids allowed are:                    Water, Juice (non-citric and without pulp), Carbonated beverages, Clear Tea, Black Coffee only and Gatorade    Take these medicines the morning of surgery with A SIP OF WATER  albuterol (PROVENTIL HFA;VENTOLIN HFA)  Bring wit you the day of surgery cetirizine (ZYRTEC) lamoTRIgine (LAMICTAL)   7 days prior to surgery STOP taking any Aspirin (unless otherwise instructed by your surgeon), Aleve, Naproxen, Ibuprofen, Motrin, Advil, Goody's, BC's, all herbal medications, fish oil, and all vitamins.   WHAT DO I DO ABOUT MY DIABETES MEDICATION?   Marland Kitchen Do not take oral diabetes medicines (pills) the morning of surgery. metFORMIN (GLUCOPHAGE)    How to Manage Your Diabetes Before and After Surgery  Why is it important to control my blood sugar before and after surgery? . Improving blood sugar levels before and after surgery helps healing and can limit problems. . A way of improving blood sugar control is eating a healthy diet by: o  Eating less sugar and carbohydrates o  Increasing activity/exercise o  Talking with your doctor about reaching your blood sugar goals . High blood sugars (greater than 180 mg/dL) can raise your risk of infections and slow your recovery, so you will need to focus on controlling your diabetes during the weeks before surgery. . Make sure that the doctor who takes care of your diabetes knows about your planned surgery including the date and  location.  How do I manage my blood sugar before surgery? . Check your blood sugar at least 4 times a day, starting 2 days before surgery, to make sure that the level is not too high or low. o Check your blood sugar the morning of your surgery when you wake up and every 2 hours until you get to the Short Stay unit. . If your blood sugar is less than 70 mg/dL, you will need to treat for low blood sugar: o Do not take insulin. o Treat a low blood sugar (less than 70 mg/dL) with  cup of clear juice (cranberry or apple), 4 glucose tablets, OR glucose gel. o Recheck blood sugar in 15 minutes after treatment (to make sure it is greater than 70 mg/dL). If your blood sugar is not greater than 70 mg/dL on recheck, call [phone] for further instructions. . Report your blood sugar to the short stay nurse when you get to Short Stay.  . If you are admitted to the hospital after surgery: o Your blood sugar will be checked by the staff and you will probably be given insulin after surgery (instead of oral diabetes medicines) to make sure you have good blood sugar levels. o The goal for blood sugar control after surgery is 80-180 mg/dL.    Do not wear jewelry, make-up or nail polish.  Do not wear lotions, powders, or perfumes, or deodorant.  Do not shave  48 hours prior to surgery.  Men may shave face and neck.  Do not bring valuables to the hospital.  [facility] is not responsible for any belongings or valuables.  Contacts, dentures or bridgework may not be worn into surgery.  Leave your suitcase in the car.  After surgery it may be brought to your room.  For patients admitted to the hospital, discharge time will be determined by your treatment team.  Patients discharged the day of surgery will not be allowed to drive home.    Special instructions:   [facility]- Preparing For Surgery  Before surgery, you can play an important role. Because skin is not sterile, your skin needs to be as free of  germs as possible. You can reduce the number of germs on your skin by washing with CHG (chlorahexidine gluconate) Soap before surgery.  CHG is an antiseptic cleaner which kills germs and bonds with the skin to continue killing germs even after washing.    Oral Hygiene is also important to reduce your risk of infection.  Remember - BRUSH YOUR TEETH THE MORNING OF SURGERY WITH YOUR REGULAR TOOTHPASTE  Please do not use if you have an allergy to CHG or antibacterial soaps. If your skin becomes reddened/irritated stop using the CHG.  Do not shave (including legs and underarms) for at least 48 hours prior to first CHG shower. It is OK to shave your face.  Please follow these instructions carefully.   1. Shower the NIGHT BEFORE SURGERY and the MORNING OF SURGERY with CHG.   2. If you chose to wash your hair, wash your hair first as usual with your normal shampoo.  3. After you shampoo, rinse your hair and body thoroughly to remove the shampoo.  4. Use CHG as you would any other liquid soap. You can apply CHG directly to the skin and wash gently with a scrungie or a clean washcloth.   5. Apply the CHG Soap to your body ONLY FROM THE NECK DOWN.  Do not use on open wounds or open sores. Avoid contact with your eyes, ears, mouth and genitals (private parts). Wash Face and genitals (private parts)  with your normal soap.  6. Wash thoroughly, paying special attention to the area where your surgery will be performed.  7. Thoroughly rinse your body with warm water from the neck down.  8. DO NOT shower/wash with your normal soap after using and rinsing off the CHG Soap.  9. Pat yourself dry with a CLEAN TOWEL.  10. Wear CLEAN PAJAMAS to bed the night before surgery, wear comfortable clothes the morning of surgery  11. Place CLEAN SHEETS on your bed the night of your first shower and DO NOT SLEEP WITH PETS.    Day of Surgery:  Do not apply any deodorants/lotions.  Please wear clean clothes to the  hospital/surgery center.   Remember to brush your teeth WITH YOUR REGULAR TOOTHPASTE.    Please read over the following fact sheets that you were given.

## 2018-06-11 ENCOUNTER — Other Ambulatory Visit: Payer: BLUE CROSS/BLUE SHIELD

## 2018-06-11 ENCOUNTER — Encounter (HOSPITAL_COMMUNITY)
Admission: RE | Disposition: A | Discharge status: Home / Self Care | Payer: Self-pay | Source: Ambulatory Visit | Attending: Obstetrics and Gynecology

## 2018-06-11 ENCOUNTER — Ambulatory Visit (HOSPITAL_COMMUNITY): Payer: BLUE CROSS/BLUE SHIELD | Admitting: Certified Registered Nurse Anesthetist

## 2018-06-11 ENCOUNTER — Encounter (HOSPITAL_COMMUNITY): Payer: Self-pay

## 2018-06-11 ENCOUNTER — Ambulatory Visit (HOSPITAL_COMMUNITY): Payer: BLUE CROSS/BLUE SHIELD | Admitting: Physician Assistant

## 2018-06-11 ENCOUNTER — Ambulatory Visit (HOSPITAL_COMMUNITY)
Admission: RE | Admit: 2018-06-11 | Discharge: 2018-06-11 | Disposition: A | Discharge status: Home / Self Care | Payer: BLUE CROSS/BLUE SHIELD | Source: Ambulatory Visit | Attending: Obstetrics and Gynecology | Admitting: Obstetrics and Gynecology

## 2018-06-11 DIAGNOSIS — E119 Type 2 diabetes mellitus without complications: Secondary | ICD-10-CM | POA: Insufficient documentation

## 2018-06-11 DIAGNOSIS — N840 Polyp of corpus uteri: Secondary | ICD-10-CM | POA: Insufficient documentation

## 2018-06-11 DIAGNOSIS — I10 Essential (primary) hypertension: Secondary | ICD-10-CM | POA: Diagnosis not present

## 2018-06-11 DIAGNOSIS — Z7984 Long term (current) use of oral hypoglycemic drugs: Secondary | ICD-10-CM | POA: Diagnosis not present

## 2018-06-11 DIAGNOSIS — F419 Anxiety disorder, unspecified: Secondary | ICD-10-CM | POA: Diagnosis not present

## 2018-06-11 DIAGNOSIS — Z79899 Other long term (current) drug therapy: Secondary | ICD-10-CM | POA: Diagnosis not present

## 2018-06-11 DIAGNOSIS — F329 Major depressive disorder, single episode, unspecified: Secondary | ICD-10-CM | POA: Diagnosis not present

## 2018-06-11 DIAGNOSIS — N950 Postmenopausal bleeding: Secondary | ICD-10-CM | POA: Diagnosis not present

## 2018-06-11 DIAGNOSIS — Z6841 Body Mass Index (BMI) 40.0 and over, adult: Secondary | ICD-10-CM | POA: Insufficient documentation

## 2018-06-11 DIAGNOSIS — G4730 Sleep apnea, unspecified: Secondary | ICD-10-CM | POA: Insufficient documentation

## 2018-06-11 HISTORY — PX: DILATATION & CURETTAGE/HYSTEROSCOPY WITH MYOSURE: SHX6511

## 2018-06-11 LAB — GLUCOSE, CAPILLARY
GLUCOSE-CAPILLARY: 106 mg/dL — AB (ref 70–99)
Glucose-Capillary: 119 mg/dL — ABNORMAL HIGH (ref 70–99)

## 2018-06-11 LAB — TYPE AND SCREEN
ABO/RH(D): A POS
ANTIBODY SCREEN: NEGATIVE

## 2018-06-11 LAB — ABO/RH: ABO/RH(D): A POS

## 2018-06-11 SURGERY — DILATATION & CURETTAGE/HYSTEROSCOPY WITH MYOSURE
Anesthesia: General

## 2018-06-11 MED ORDER — SCOPOLAMINE 1 MG/3DAYS TD PT72
1.0000 | MEDICATED_PATCH | Freq: Once | TRANSDERMAL | Status: DC
  Administered 2018-06-11: 1.5 mg via TRANSDERMAL

## 2018-06-11 MED ORDER — SCOPOLAMINE 1 MG/3DAYS TD PT72
MEDICATED_PATCH | TRANSDERMAL | Status: AC
  Administered 2018-06-11: 1.5 mg via TRANSDERMAL
  Filled 2018-06-11: qty 1

## 2018-06-11 MED ORDER — ONDANSETRON HCL 4 MG/2ML IJ SOLN
INTRAMUSCULAR | Status: AC
  Filled 2018-06-11: qty 2

## 2018-06-11 MED ORDER — DEXTROSE 5 % IV SOLN
3.0000 g | INTRAVENOUS | Status: AC
  Administered 2018-06-11: 3 g via INTRAVENOUS
  Filled 2018-06-11: qty 3000

## 2018-06-11 MED ORDER — KETOROLAC TROMETHAMINE 30 MG/ML IJ SOLN: 30.0000 mg | Freq: Once | INTRAMUSCULAR | Status: DC | PRN

## 2018-06-11 MED ORDER — ACETAMINOPHEN 325 MG PO TABS: 325.0000 mg | ORAL_TABLET | ORAL | Status: DC | PRN

## 2018-06-11 MED ORDER — DEXAMETHASONE SODIUM PHOSPHATE 10 MG/ML IJ SOLN
INTRAMUSCULAR | Status: AC
  Filled 2018-06-11: qty 1

## 2018-06-11 MED ORDER — DEXAMETHASONE SODIUM PHOSPHATE 10 MG/ML IJ SOLN
INTRAMUSCULAR | Status: DC | PRN
  Administered 2018-06-11: 10 mg via INTRAVENOUS

## 2018-06-11 MED ORDER — OXYCODONE HCL 5 MG/5ML PO SOLN: 5.0000 mg | Freq: Once | ORAL | Status: DC | PRN

## 2018-06-11 MED ORDER — LIDOCAINE HCL 1 % IJ SOLN
INTRAMUSCULAR | Status: AC
  Filled 2018-06-11: qty 20

## 2018-06-11 MED ORDER — CEFAZOLIN SODIUM-DEXTROSE 2-4 GM/100ML-% IV SOLN
INTRAVENOUS | Status: AC
  Filled 2018-06-11: qty 100

## 2018-06-11 MED ORDER — MIDAZOLAM HCL 2 MG/2ML IJ SOLN
INTRAMUSCULAR | Status: AC
  Filled 2018-06-11: qty 2

## 2018-06-11 MED ORDER — FENTANYL CITRATE (PF) 100 MCG/2ML IJ SOLN
INTRAMUSCULAR | Status: DC | PRN
  Administered 2018-06-11: 100 ug via INTRAVENOUS

## 2018-06-11 MED ORDER — FENTANYL CITRATE (PF) 100 MCG/2ML IJ SOLN
25.0000 ug | INTRAMUSCULAR | Status: DC | PRN
  Administered 2018-06-11 (×2): 50 ug via INTRAVENOUS

## 2018-06-11 MED ORDER — PROPOFOL 10 MG/ML IV BOLUS
INTRAVENOUS | Status: AC
  Filled 2018-06-11: qty 40

## 2018-06-11 MED ORDER — MEPERIDINE HCL 25 MG/ML IJ SOLN
6.2500 mg | INTRAMUSCULAR | Status: DC | PRN
  Administered 2018-06-11: 12.5 mg via INTRAVENOUS

## 2018-06-11 MED ORDER — MEPERIDINE HCL 25 MG/ML IJ SOLN
INTRAMUSCULAR | Status: AC
  Filled 2018-06-11: qty 1

## 2018-06-11 MED ORDER — FENTANYL CITRATE (PF) 100 MCG/2ML IJ SOLN
INTRAMUSCULAR | Status: AC
  Administered 2018-06-11: 50 ug via INTRAVENOUS
  Filled 2018-06-11: qty 2

## 2018-06-11 MED ORDER — ONDANSETRON HCL 4 MG/2ML IJ SOLN
INTRAMUSCULAR | Status: DC | PRN
  Administered 2018-06-11: 4 mg via INTRAVENOUS

## 2018-06-11 MED ORDER — KETOROLAC TROMETHAMINE 30 MG/ML IJ SOLN
INTRAMUSCULAR | Status: DC | PRN
  Administered 2018-06-11: 30 mg via INTRAVENOUS

## 2018-06-11 MED ORDER — LIDOCAINE HCL 1 % IJ SOLN
INTRAMUSCULAR | Status: DC | PRN
  Administered 2018-06-11: 20 mL

## 2018-06-11 MED ORDER — LIDOCAINE HCL (PF) 1 % IJ SOLN
INTRAMUSCULAR | Status: AC
  Filled 2018-06-11: qty 5

## 2018-06-11 MED ORDER — MIDAZOLAM HCL 2 MG/2ML IJ SOLN
INTRAMUSCULAR | Status: DC | PRN
  Administered 2018-06-11: 2 mg via INTRAVENOUS

## 2018-06-11 MED ORDER — ACETAMINOPHEN 160 MG/5ML PO SOLN: 325.0000 mg | ORAL | Status: DC | PRN

## 2018-06-11 MED ORDER — LIDOCAINE HCL (CARDIAC) PF 100 MG/5ML IV SOSY
PREFILLED_SYRINGE | INTRAVENOUS | Status: DC | PRN
  Administered 2018-06-11: 40 mg via INTRAVENOUS
  Administered 2018-06-11: 60 mg via INTRAVENOUS

## 2018-06-11 MED ORDER — KETOROLAC TROMETHAMINE 30 MG/ML IJ SOLN
INTRAMUSCULAR | Status: AC
  Filled 2018-06-11: qty 1

## 2018-06-11 MED ORDER — OXYCODONE HCL 5 MG PO TABS: 5.0000 mg | ORAL_TABLET | Freq: Once | ORAL | Status: DC | PRN

## 2018-06-11 MED ORDER — FENTANYL CITRATE (PF) 100 MCG/2ML IJ SOLN
INTRAMUSCULAR | Status: AC
  Filled 2018-06-11: qty 2

## 2018-06-11 MED ORDER — PROPOFOL 10 MG/ML IV BOLUS
INTRAVENOUS | Status: DC | PRN
  Administered 2018-06-11: 200 mg via INTRAVENOUS

## 2018-06-11 MED ORDER — ONDANSETRON HCL 4 MG/2ML IJ SOLN: 4.0000 mg | Freq: Once | INTRAMUSCULAR | Status: DC | PRN

## 2018-06-11 MED ORDER — SODIUM CHLORIDE 0.9 % IR SOLN
Status: DC | PRN
  Administered 2018-06-11: 3000 mL

## 2018-06-11 MED ORDER — LACTATED RINGERS IV SOLN
INTRAVENOUS | Status: DC
  Administered 2018-06-11: 125 mL/h via INTRAVENOUS

## 2018-06-11 SURGICAL SUPPLY — 15 items
CATH ROBINSON RED A/P 16FR (CATHETERS) ×2 IMPLANT
DEVICE MYOSURE LITE (MISCELLANEOUS) ×1 IMPLANT
DEVICE MYOSURE REACH (MISCELLANEOUS) IMPLANT
GLOVE BIO SURGEON STRL SZ7 (GLOVE) ×2 IMPLANT
GLOVE BIOGEL PI IND STRL 7.0 (GLOVE) ×1 IMPLANT
GLOVE BIOGEL PI INDICATOR 7.0 (GLOVE) ×1
GOWN STRL REUS W/TWL LRG LVL3 (GOWN DISPOSABLE) ×4 IMPLANT
HIBICLENS CHG 4% 4OZ BTL (MISCELLANEOUS) ×2 IMPLANT
KIT PROCEDURE FLUENT (KITS) ×2 IMPLANT
NDL SPNL 18GX3.5 QUINCKE PK (NEEDLE) IMPLANT
NEEDLE SPNL 18GX3.5 QUINCKE PK (NEEDLE) ×2 IMPLANT
PACK VAGINAL MINOR WOMEN LF (CUSTOM PROCEDURE TRAY) ×2 IMPLANT
PAD OB MATERNITY 4.3X12.25 (PERSONAL CARE ITEMS) ×2 IMPLANT
SEAL ROD LENS SCOPE MYOSURE (ABLATOR) ×2 IMPLANT
TOWEL OR 17X24 6PK STRL BLUE (TOWEL DISPOSABLE) ×4 IMPLANT

## 2018-06-11 NOTE — Discharge Instructions (Signed)

## 2018-06-11 NOTE — Anesthesia Preprocedure Evaluation (Signed)
Anesthesia Evaluation  Patient identified by MRN, date of birth, ID band Patient awake    Reviewed: Allergy & Precautions, H&P , NPO status , Patient's Chart, lab work & pertinent test results  Airway Mallampati: I  TM Distance: >3 FB Neck ROM: full    Dental no notable dental hx. (+) Teeth Intact   Pulmonary sleep apnea and Continuous Positive Airway Pressure Ventilation ,    Pulmonary exam normal breath sounds clear to auscultation       Cardiovascular negative cardio ROS Normal cardiovascular exam Rhythm:regular Rate:Normal     Neuro/Psych PSYCHIATRIC DISORDERS Anxiety Depression negative neurological ROS     GI/Hepatic negative GI ROS, Neg liver ROS,   Endo/Other  diabetes, Type 2, Oral Hypoglycemic AgentsMorbid obesity  Renal/GU      Musculoskeletal   Abdominal (+) + obese,   Peds  Hematology negative hematology ROS (+)   Anesthesia Other Findings   Reproductive/Obstetrics negative OB ROS                             Anesthesia Physical Anesthesia Plan  ASA: III  Anesthesia Plan: General   Post-op Pain Management:    Induction: Intravenous  PONV Risk Score and Plan: 4 or greater and Ondansetron, Dexamethasone and Midazolam  Airway Management Planned: LMA  Additional Equipment:   Intra-op Plan:   Post-operative Plan: Extubation in OR  Informed Consent: I have reviewed the patients History and Physical, chart, labs and discussed the procedure including the risks, benefits and alternatives for the proposed anesthesia with the patient or authorized representative who has indicated his/her understanding and acceptance.     Dental Advisory Given  Plan Discussed with: CRNA and Surgeon  Anesthesia Plan Comments:         Anesthesia Quick Evaluation

## 2018-06-11 NOTE — Brief Op Note (Signed)
06/11/2018  7:52 AM  PATIENT:  Erika Woods  53 y.o. female  PRE-OPERATIVE DIAGNOSIS:  AUB  POST-OPERATIVE DIAGNOSIS:  AUB  PROCEDURE:  Procedure(s) with comments: DILATATION & CURETTAGE/HYSTEROSCOPY WITH MYOSURE RESECTION OF ENDOMETRIAL THICKENING (N/A) - BMI 56  SURGEON:  Surgeon(s) and Role:    * Geraldene Eisel, MD - Primary  PHYSICIAN ASSISTANT:   ASSISTANTS: none   ANESTHESIA:   general  FMB:[id number];  BLOOD ADMINISTERED:none  DRAINS: none   LOCAL MEDICATIONS USED:  XYLOCAINE   SPECIMEN: endometiral resections and curretting  DISPOSITION OF SPECIMEN:  PATHOLOGY  COUNTS:  YES  TOURNIQUET:  * No tourniquets in log *  DICTATION: .Other Dictation: Dictation Number [id number]  PLAN OF CARE: Discharge to home after PACU  PATIENT DISPOSITION:  PACU - hemodynamically stable.   Delay start of Pharmacological VTE agent (>24hrs) due to surgical blood loss or risk of bleeding: not applicable

## 2018-06-11 NOTE — Anesthesia Postprocedure Evaluation (Signed)
Anesthesia Post Note  Patient: Erika Woods  Procedure(s) Performed: DILATATION & CURETTAGE/HYSTEROSCOPY WITH MYOSURE RESECTION OF ENDOMETRIAL THICKENING (N/A )     Patient location during evaluation: PACU Anesthesia Type: General Level of consciousness: awake Pain management: pain level controlled Vital Signs Assessment: post-procedure vital signs reviewed and stable Respiratory status: spontaneous breathing Cardiovascular status: stable Postop Assessment: no apparent nausea or vomiting Anesthetic complications: no    Last Vitals:  Vitals:   06/11/18 1000 06/11/18 1015  BP: 114/73 112/72  Pulse: 61 63  Resp: 15 18  Temp:    SpO2: 97% 97%    Last Pain:  Vitals:   06/11/18 0945  TempSrc:   PainSc: 0-No pain   Pain Goal: Patients Stated Pain Goal: 5 (06/11/18 0755)                 Caren Macadam

## 2018-06-11 NOTE — Transfer of Care (Signed)
Immediate Anesthesia Transfer of Care Note  Patient: Erika Woods  Procedure(s) Performed: DILATATION & CURETTAGE/HYSTEROSCOPY WITH MYOSURE RESECTION OF ENDOMETRIAL THICKENING (N/A )  Patient Location: PACU  Anesthesia Type:General  Level of Consciousness: drowsy and patient cooperative  Airway & Oxygen Therapy: Patient Spontanous Breathing and Patient connected to nasal cannula oxygen  Post-op Assessment: Report given to RN and Post -op Vital signs reviewed and stable  Post vital signs: Reviewed and stable  Last Vitals:  Vitals Value Taken Time  BP 136/83 06/11/2018  7:53 AM  Temp    Pulse 72 06/11/2018  7:55 AM  Resp 20 06/11/2018  7:55 AM  SpO2 92 % 06/11/2018  7:55 AM  Vitals shown include unvalidated device data.  Last Pain:  Vitals:   06/11/18 0629  TempSrc: Oral  PainSc: 0-No pain      Patients Stated Pain Goal: 5 (06/11/18 2426)  Complications: No apparent anesthesia complications

## 2018-06-11 NOTE — Op Note (Signed)
NAME: Erika Woods, Erika Woods MEDICAL RECORD [id number] ACCOUNT [id number] DATE OF BIRTH:06/03/1965 FACILITY: [facility] LOCATION: [facility] PHYSICIAN:Lochlin Eppinger Lisbeth Ply, MD  OPERATIVE REPORT  DATE OF PROCEDURE:  06/11/2018  PREOPERATIVE DIAGNOSIS:  Postmenopausal bleeding with some endometrial thickness.  POSTOPERATIVE DIAGNOSIS:  Postmenopausal bleeding with some endometrial thickness.  OPERATIVE PROCEDURE:  Paracervical block.  Cervical dilation with hysteroscopy, resection of thickened endometrium along with endometrial curetting.  SURGEON:  Juluis Mire, MD  ANESTHESIA:  General with paracervical block.  ESTIMATED BLOOD LOSS:  Minimal.  PACKS AND DRAINS:  None.  INTRAOPERATIVE BLOOD PLACED:  None.  COMPLICATIONS:  None.  INDICATIONS:  Dictated in history and physical.  DESCRIPTION OF PROCEDURE:  The patient was taken to the OR and placed in supine position.  She was then placed in the dorsal lithotomy position and properly positioned.  After satisfactory level of general anesthesia was obtained, the perineum and vagina  were prepped out with Hibiclens.  The bladder was emptied out with catheterization.  The patient was then draped in sterile field.  Speculum was placed in the vaginal vault.  The cervix was identified, grasped with a single tooth tenaculum.   Paracervical block was administered using 1% Xylocaine.  Uterus sounded to about 9 cm.  Cervix was serially dilated.  Hysteroscope was introduced.  Intrauterine cavity was distended using saline.  On visualization, she had some thickened endometrium on  the posterior wall.  We brought in the small MyoSure, resected that area and other areas around.  There is really nothing that looked that significant.  The hysteroscope was then removed.  Endometrial curettings were then obtained.  Total deficit was 5  mL.  There were no signs of complications.  At this point in time, the single tooth tenaculum and speculum removed.  The patient was  taken out of dorsal lithotomy position.  Once alert and extubated transferred to recovery room in good condition.  TN/NUANCE  D:06/11/2018 T:06/11/2018 JOB:[id number]

## 2018-06-11 NOTE — H&P (Signed)
  History and physical exam unchanged 

## 2018-06-11 NOTE — Anesthesia Procedure Notes (Signed)
Procedure Name: LMA Insertion Date/Time: 06/11/2018 7:27 AM Performed by: Yolonda Kida, CRNA Pre-anesthesia Checklist: Patient identified, Emergency Drugs available, Suction available and Patient being monitored Patient Re-evaluated:Patient Re-evaluated prior to induction Oxygen Delivery Method: Circle system utilized Preoxygenation: Pre-oxygenation with 100% oxygen Induction Type: IV induction LMA: LMA with gastric port inserted LMA Size: 4.0 Number of attempts: 1 Placement Confirmation: positive ETCO2,  CO2 detector and breath sounds checked- equal and bilateral Tube secured with: Tape Dental Injury: Teeth and Oropharynx as per pre-operative assessment

## 2018-06-12 ENCOUNTER — Encounter (HOSPITAL_COMMUNITY): Payer: Self-pay | Admitting: Obstetrics and Gynecology

## 2018-06-14 ENCOUNTER — Emergency Department (HOSPITAL_COMMUNITY): Payer: BLUE CROSS/BLUE SHIELD

## 2018-06-14 ENCOUNTER — Other Ambulatory Visit: Payer: Self-pay

## 2018-06-14 ENCOUNTER — Emergency Department (HOSPITAL_COMMUNITY)
Admission: EM | Admit: 2018-06-14 | Discharge: 2018-06-14 | Disposition: A | Discharge status: Home / Self Care | Payer: BLUE CROSS/BLUE SHIELD | Attending: Emergency Medicine | Admitting: Emergency Medicine

## 2018-06-14 ENCOUNTER — Encounter (HOSPITAL_COMMUNITY): Payer: Self-pay | Admitting: Emergency Medicine

## 2018-06-14 DIAGNOSIS — R079 Chest pain, unspecified: Secondary | ICD-10-CM | POA: Diagnosis not present

## 2018-06-14 DIAGNOSIS — E119 Type 2 diabetes mellitus without complications: Secondary | ICD-10-CM | POA: Insufficient documentation

## 2018-06-14 DIAGNOSIS — R5381 Other malaise: Secondary | ICD-10-CM | POA: Insufficient documentation

## 2018-06-14 DIAGNOSIS — Z79899 Other long term (current) drug therapy: Secondary | ICD-10-CM | POA: Insufficient documentation

## 2018-06-14 LAB — POCT I-STAT TROPONIN I: Troponin i, poc: 0 ng/mL (ref 0.00–0.08)

## 2018-06-14 LAB — BASIC METABOLIC PANEL
Anion gap: 7 (ref 5–15)
BUN: 17 mg/dL (ref 6–20)
CO2: 26 mmol/L (ref 22–32)
Calcium: 8.9 mg/dL (ref 8.9–10.3)
Chloride: 106 mmol/L (ref 98–111)
Creatinine, Ser: 1.02 mg/dL — ABNORMAL HIGH (ref 0.44–1.00)
GFR calc Af Amer: >60 mL/min (ref >60)
GFR calc non Af Amer: >60 mL/min (ref >60)
Glucose, Bld: 135 mg/dL — ABNORMAL HIGH (ref 70–99)
Potassium: 4 mmol/L (ref 3.5–5.1)
Sodium: 139 mmol/L (ref 135–145)

## 2018-06-14 LAB — CBC
HCT: 39.7 % (ref 36.0–46.0)
Hemoglobin: 12 g/dL (ref 12.0–15.0)
MCH: 27.6 pg (ref 26.0–34.0)
MCHC: 30.2 g/dL (ref 30.0–36.0)
MCV: 91.5 fL (ref 80.0–100.0)
PLATELETS: 249 10*3/uL (ref 150–400)
RBC: 4.34 MIL/uL (ref 3.87–5.11)
RDW: 14.1 % (ref 11.5–15.5)
WBC: 8.1 10*3/uL (ref 4.0–10.5)
nRBC: 0 % (ref 0.0–0.2)

## 2018-06-14 LAB — GLUCOSE, CAPILLARY: Glucose-Capillary: 135 mg/dL — ABNORMAL HIGH (ref 70–99)

## 2018-06-14 MED ORDER — IOPAMIDOL (ISOVUE-370) INJECTION 76%
INTRAVENOUS | Status: AC
  Filled 2018-06-14: qty 100

## 2018-06-14 MED ORDER — SODIUM CHLORIDE (PF) 0.9 % IJ SOLN
INTRAMUSCULAR | Status: AC
  Filled 2018-06-14: qty 50

## 2018-06-14 MED ORDER — IOPAMIDOL (ISOVUE-370) INJECTION 76%
100.0000 mL | Freq: Once | INTRAVENOUS | Status: AC | PRN
  Administered 2018-06-14: 100 mL via INTRAVENOUS

## 2018-06-14 NOTE — ED Notes (Signed)
Informed Erika Woods that pt needs EKG order placed.

## 2018-06-14 NOTE — ED Notes (Signed)
Bed: PV37 Expected date:  Expected time:  Means of arrival:  Comments: Erika Woods

## 2018-06-14 NOTE — ED Triage Notes (Signed)
Pt reports had D&C on Friday. Reports since Saturday night been fatigued, dizzy. Denies n/v/d. Reports feels pressure when breathes.

## 2018-06-14 NOTE — ED Provider Notes (Signed)
[facility]-EMERGENCY DEPT Provider Note   CSN: [id number] Arrival date & time: 06/14/18  1532     History   Chief Complaint Chief Complaint  Patient presents with  . Post-op Problem  . Dizziness  . Fatigue    HPI Erika Woods is a 53 y.o. female.  The history is provided by the patient and medical records. No language interpreter was used.  Dizziness   Erika Woods is a 53 y.o. female who presents to the Emergency Department complaining of fatigue. She presents to the emergency department for evaluation of fatigue, generalized weakness and malaise. Three days ago she had a D and C. Since that time she is experienced light spotting but has not felt well. She denies any fevers, chest pain. She does have mild chest heaviness. She has a history of pulmonary embolism following arthroscopic knee surgery. She is no longer anticoagulated. She has mild lower extremity edema that is similar when compared to her baseline. She was referred to the emergency department for further evaluation of possible pulmonary embolism. Past Medical History:  Diagnosis Date  . Diabetes mellitus without complication ([name])   . Impaired fasting glucose   . Obstructive sleep apnea on CPAP   . Osteoarthritis of knee   . Personal history of pulmonary embolism   . Renal disorder    kideny stones  . Restless leg syndrome   . Thoracic aortic aneurysm [facility])     Patient Active Problem List   Diagnosis Date Noted  . Severe recurrent major depression without psychotic features ([name]) 02/17/2018  . Obesity, Class III, BMI 40-49.9 (morbid obesity) ([name]) 09/16/2017  . Acute pulmonary embolism with acute cor pulmonale ([name]) 09/16/2017  . Acute superficial venous thrombosis of left lower extremity   . Thoracic aortic aneurysm without rupture ([name])   . Pulmonary emboli ([name]) 09/15/2017  . PTSD (post-traumatic stress disorder) 08/18/2011  . DM (diabetes mellitus) ([name]) 08/18/2011  . Sleep  apnea 08/18/2011  . Mood disorder ([name]) 08/18/2011    Past Surgical History:  Procedure Laterality Date  . arm surgery Bilateral Skin removal  . DILATATION & CURETTAGE/HYSTEROSCOPY WITH MYOSURE N/A 06/11/2018   Procedure: DILATATION & CURETTAGE/HYSTEROSCOPY WITH MYOSURE RESECTION OF ENDOMETRIAL THICKENING;  Surgeon: Richardean Chimera, MD;  Location: [facility];  Service: Gynecology;  Laterality: N/A;  BMI 56  . DILATION AND EVACUATION    . KIDNEY STONE SURGERY    . KNEE SURGERY Right   . TONSILLECTOMY       OB History   No obstetric history on file.      Home Medications    Prior to Admission medications   Medication Sig Start Date End Date Taking? Authorizing Provider  albuterol (PROVENTIL HFA;VENTOLIN HFA) 108 (90 Base) MCG/ACT inhaler Inhale 1-2 puffs into the lungs every 6 (six) hours as needed for wheezing or shortness of breath. 02/06/18  Yes Arby Barrette, MD  cetirizine (ZYRTEC) 10 MG tablet Take 10 mg by mouth daily.   Yes [provider]  lamoTRIgine (LAMICTAL) 100 MG tablet Take 100 mg by mouth every morning.   Yes [provider]  metFORMIN (GLUCOPHAGE) 500 MG tablet Take 500 mg by mouth 2 (two) times daily with a meal.  10/28/13  Yes [provider]  rOPINIRole (REQUIP XL) 4 MG 24 hr tablet Take 4 mg by mouth at bedtime.    Yes [provider]  traZODone (DESYREL) 50 MG tablet Take 250 mg by mouth at bedtime.   Yes [provider]  vortioxetine HBr (TRINTELLIX) 10 MG TABS tablet Take 2 tablets (20 mg total) by mouth daily. Patient taking differently: Take 10 mg by mouth daily.  02/22/18  Yes Charm Rings, NP    Family History Family History  Adopted: Yes    Social History Social History   Tobacco Use  . Smoking status: Never Smoker  . Smokeless tobacco: Never Used  Substance Use Topics  . Alcohol use: Yes    Comment: "wine once or twice a month"  . Drug use: No     Allergies   Sulfa antibiotics   Review of  Systems Review of Systems  Neurological: Positive for dizziness.  All other systems reviewed and are negative.    Physical Exam Updated Vital Signs BP (!) 146/81 (BP Location: Left Arm)   Pulse (!) 59   Temp 98.7 F (37.1 C) (Oral)   Resp 18   LMP 01/29/2013   SpO2 95%   Physical Exam Vitals signs and nursing note reviewed.  Constitutional:      Appearance: She is well-developed.  HENT:     Head: Normocephalic and atraumatic.  Cardiovascular:     Rate and Rhythm: Normal rate and regular rhythm.     Heart sounds: No murmur.  Pulmonary:     Effort: Pulmonary effort is normal. No respiratory distress.     Breath sounds: Normal breath sounds.  Abdominal:     Palpations: Abdomen is soft.     Tenderness: There is no abdominal tenderness. There is no guarding or rebound.  Musculoskeletal:        General: No tenderness.     Comments: Nonpitting edema to bilateral lower extremities  Skin:    General: Skin is warm and dry.  Neurological:     Mental Status: She is alert and oriented to person, place, and time.  Psychiatric:        Mood and Affect: Mood normal.        Behavior: Behavior normal.      ED Treatments / Results  Labs (all labs ordered are listed, but only abnormal results are displayed) Labs Reviewed  BASIC METABOLIC PANEL - Abnormal; Notable for the following components:      Result Value   Glucose, Bld 135 (*)    Creatinine, Ser 1.02 (*)    All other components within normal limits  GLUCOSE, CAPILLARY - Abnormal; Notable for the following components:   Glucose-Capillary 135 (*)    All other components within normal limits  CBC  URINALYSIS, ROUTINE W REFLEX MICROSCOPIC  CBG MONITORING, ED  I-STAT TROPONIN, ED  POCT I-STAT TROPONIN I    EKG None  Radiology Dg Chest 2 View  Result Date: 06/14/2018 CLINICAL DATA:  Weakness in legs and shortness of breath. History of D and C 06/11/2018 EXAM: CHEST - 2 VIEW COMPARISON:  02/06/2018 and chest CT  05/11/2018 FINDINGS: The cardiac silhouette, mediastinal and hilar contours are within normal limits and stable. Streaky bibasilar atelectasis but no definite infiltrates or effusions. Bony thorax is intact. IMPRESSION: Streaky subsegmental atelectasis but no definite infiltrates or effusions. Electronically Signed   By: Rudie Meyer M.D.   On: 06/14/2018 17:11   Ct Angio Chest Pe W/cm &/or Wo Cm  Result Date: 06/14/2018 CLINICAL DATA:  53 year old female status post dilatation and curettage 3 days ago. Subsequent shortness of breath, dizziness, fatigue. EXAM: CT ANGIOGRAPHY CHEST WITH CONTRAST TECHNIQUE: Multidetector CT imaging of the chest was performed using the standard protocol during bolus  administration of intravenous contrast. Multiplanar CT image reconstructions and MIPs were obtained to evaluate the vascular anatomy. CONTRAST:  100mL ISOVUE-370 IOPAMIDOL (ISOVUE-370) INJECTION 76% COMPARISON:  CTA chest 05/11/2018 and earlier. FINDINGS: Cardiovascular: Good contrast bolus timing in the pulmonary arterial tree. Mild central pulmonary artery enlargement. Mild respiratory motion in the lower lobes. No focal filling defect identified in the pulmonary arteries to suggest acute pulmonary embolism. Mild cardiomegaly. No pericardial effusion. Stable visible aorta including mild fusiform aneurysmal enlargement of the ascending segment estimated at 43 millimeters. Mediastinum/Nodes: Negative. No lymphadenopathy. Small gastric hiatal hernia is stable. Lungs/Pleura: Major airways are patent. Mildly lower lung volumes compared to December. Minor atelectasis and gas trapping but no other abnormal pulmonary opacity. No pleural effusion. Upper Abdomen: Chronic a patent steatosis. Negative visible spleen. Musculoskeletal: Chronic thoracic disc and endplate degeneration. No acute osseous abnormality identified. Review of the MIP images confirms the above findings. IMPRESSION: 1. Negative for acute pulmonary embolus. No  acute pulmonary process. 2. Mild main pulmonary artery enlargement might indicate pulmonary artery hypertension. Mild cardiomegaly. Stable mild fusiform aneurysmal enlargement of the ascending aorta up to 43 mm diameter. Recommend annual imaging followup by CTA or MRA. This recommendation follows 2010 ACCF/AHA/AATS/ACR/ASA/SCA/SCAI/SIR/STS/SVM Guidelines for the Diagnosis and Management of Patients with Thoracic Aortic Disease. Circulation. 2010; 121: X324-M010: E266-e369. Aortic aneurysm NOS (ICD10-I71.9) 3. Chronic fatty liver disease. Electronically Signed   By: Odessa FlemingH  Hall M.D.   On: 06/14/2018 21:39    Procedures Procedures (including critical care time)  Medications Ordered in ED Medications  sodium chloride (PF) 0.9 % injection (has no administration in time range)  iopamidol (ISOVUE-370) 76 % injection (has no administration in time range)  iopamidol (ISOVUE-370) 76 % injection 100 mL (100 mLs Intravenous Contrast Given 06/14/18 2117)     Initial Impression / Assessment and Plan / ED Course  I have reviewed the triage vital signs and the nursing notes.  Pertinent labs & imaging results that were available during my care of the patient were reviewed by me and considered in my medical decision making (see chart for details).    Patient here for evaluation of lightheadedness, chest pressure, malaise following D and C. She is non-toxic appearing on evaluation with no respiratory distress. CTA was obtained, which was negative for PE. Discussed with patient incidental findings of possible pulmonary hypertension as well as known aortic aneurysm. No evidence of pneumonia, CHF, PE, anemia. Counseled patient on home care for malaise. Discussed outpatient follow-up and return precautions.  Final Clinical Impressions(s) / ED Diagnoses   Final diagnoses:  [facility]    ED Discharge Orders    None       Tilden Fossaees, Cobi Delph, MD 06/14/18 2321

## 2018-06-14 NOTE — Discharge Instructions (Addendum)
The cause of your symptoms was not identified today. You had a CT scan that demonstrated possible pulmonary hypertension as well as a stable aortic aneurysm. Please follow-up with your family doctor and cardiologist for further evaluation. Get rechecked immediately if you have any new or concerning symptoms.

## 2018-06-15 ENCOUNTER — Ambulatory Visit
Admission: RE | Admit: 2018-06-15 | Discharge: 2018-06-15 | Disposition: A | Discharge status: Home / Self Care | Payer: BLUE CROSS/BLUE SHIELD | Source: Ambulatory Visit | Attending: Gastroenterology | Admitting: Gastroenterology

## 2018-06-15 DIAGNOSIS — K7469 Other cirrhosis of liver: Secondary | ICD-10-CM

## 2018-06-28 ENCOUNTER — Ambulatory Visit
Admission: RE | Admit: 2018-06-28 | Discharge: 2018-06-28 | Disposition: A | Discharge status: Home / Self Care | Payer: BLUE CROSS/BLUE SHIELD | Source: Ambulatory Visit | Attending: Gastroenterology | Admitting: Gastroenterology

## 2018-07-12 ENCOUNTER — Other Ambulatory Visit: Payer: Self-pay | Admitting: Gastroenterology

## 2018-07-13 ENCOUNTER — Encounter (HOSPITAL_COMMUNITY): Payer: Self-pay | Admitting: *Deleted

## 2018-07-15 ENCOUNTER — Ambulatory Visit (HOSPITAL_COMMUNITY): Payer: BLUE CROSS/BLUE SHIELD | Admitting: Anesthesiology

## 2018-07-15 ENCOUNTER — Emergency Department (HOSPITAL_COMMUNITY): Payer: BLUE CROSS/BLUE SHIELD

## 2018-07-15 ENCOUNTER — Other Ambulatory Visit: Payer: Self-pay

## 2018-07-15 ENCOUNTER — Emergency Department (HOSPITAL_COMMUNITY)
Admission: EM | Admit: 2018-07-15 | Discharge: 2018-07-15 | Disposition: A | Discharge status: Home / Self Care | Payer: BLUE CROSS/BLUE SHIELD | Source: Home / Self Care | Attending: Emergency Medicine | Admitting: Emergency Medicine

## 2018-07-15 ENCOUNTER — Encounter (HOSPITAL_COMMUNITY)
Admission: RE | Disposition: A | Discharge status: Home / Self Care | Payer: Self-pay | Source: Other Acute Inpatient Hospital | Attending: Gastroenterology

## 2018-07-15 ENCOUNTER — Ambulatory Visit (HOSPITAL_COMMUNITY)
Admission: RE | Admit: 2018-07-15 | Discharge: 2018-07-15 | Disposition: A | Discharge status: Home / Self Care | Payer: BLUE CROSS/BLUE SHIELD | Source: Other Acute Inpatient Hospital | Attending: Gastroenterology | Admitting: Gastroenterology

## 2018-07-15 ENCOUNTER — Encounter (HOSPITAL_COMMUNITY): Payer: Self-pay

## 2018-07-15 DIAGNOSIS — E119 Type 2 diabetes mellitus without complications: Secondary | ICD-10-CM

## 2018-07-15 DIAGNOSIS — J4541 Moderate persistent asthma with (acute) exacerbation: Secondary | ICD-10-CM

## 2018-07-15 DIAGNOSIS — K7460 Unspecified cirrhosis of liver: Secondary | ICD-10-CM | POA: Insufficient documentation

## 2018-07-15 DIAGNOSIS — G2581 Restless legs syndrome: Secondary | ICD-10-CM | POA: Diagnosis not present

## 2018-07-15 DIAGNOSIS — F329 Major depressive disorder, single episode, unspecified: Secondary | ICD-10-CM | POA: Insufficient documentation

## 2018-07-15 DIAGNOSIS — Z6841 Body Mass Index (BMI) 40.0 and over, adult: Secondary | ICD-10-CM | POA: Insufficient documentation

## 2018-07-15 DIAGNOSIS — Z79899 Other long term (current) drug therapy: Secondary | ICD-10-CM | POA: Insufficient documentation

## 2018-07-15 DIAGNOSIS — F419 Anxiety disorder, unspecified: Secondary | ICD-10-CM | POA: Insufficient documentation

## 2018-07-15 DIAGNOSIS — G4733 Obstructive sleep apnea (adult) (pediatric): Secondary | ICD-10-CM | POA: Insufficient documentation

## 2018-07-15 DIAGNOSIS — K449 Diaphragmatic hernia without obstruction or gangrene: Secondary | ICD-10-CM | POA: Diagnosis not present

## 2018-07-15 DIAGNOSIS — Z13810 Encounter for screening for upper gastrointestinal disorder: Secondary | ICD-10-CM | POA: Diagnosis not present

## 2018-07-15 DIAGNOSIS — K209 Esophagitis, unspecified: Secondary | ICD-10-CM | POA: Diagnosis not present

## 2018-07-15 DIAGNOSIS — Z7984 Long term (current) use of oral hypoglycemic drugs: Secondary | ICD-10-CM

## 2018-07-15 DIAGNOSIS — R0602 Shortness of breath: Secondary | ICD-10-CM | POA: Insufficient documentation

## 2018-07-15 HISTORY — PX: ESOPHAGOGASTRODUODENOSCOPY (EGD) WITH PROPOFOL: SHX5813

## 2018-07-15 LAB — CBC WITH DIFFERENTIAL/PLATELET
Abs Immature Granulocytes: 0.01 10*3/uL (ref 0.00–0.07)
Basophils Absolute: 0 10*3/uL (ref 0.0–0.1)
Basophils Relative: 1 %
Eosinophils Absolute: 0.1 10*3/uL (ref 0.0–0.5)
Eosinophils Relative: 3 %
HCT: 38.6 % (ref 36.0–46.0)
Hemoglobin: 11.3 g/dL — ABNORMAL LOW (ref 12.0–15.0)
Immature Granulocytes: 0 %
Lymphocytes Relative: 16 %
Lymphs Abs: 0.7 10*3/uL (ref 0.7–4.0)
MCH: 26.8 pg (ref 26.0–34.0)
MCHC: 29.3 g/dL — ABNORMAL LOW (ref 30.0–36.0)
MCV: 91.7 fL (ref 80.0–100.0)
Monocytes Absolute: 0.5 10*3/uL (ref 0.1–1.0)
Monocytes Relative: 10 %
Neutro Abs: 3.1 10*3/uL (ref 1.7–7.7)
Neutrophils Relative %: 70 %
Platelets: 203 10*3/uL (ref 150–400)
RBC: 4.21 MIL/uL (ref 3.87–5.11)
RDW: 14.7 % (ref 11.5–15.5)
WBC: 4.4 10*3/uL (ref 4.0–10.5)
nRBC: 0 % (ref 0.0–0.2)

## 2018-07-15 LAB — COMPREHENSIVE METABOLIC PANEL
ALT: 32 U/L (ref 0–44)
AST: 26 U/L (ref 15–41)
Albumin: 4 g/dL (ref 3.5–5.0)
Alkaline Phosphatase: 62 U/L (ref 38–126)
Anion gap: 7 (ref 5–15)
BUN: 15 mg/dL (ref 6–20)
CO2: 25 mmol/L (ref 22–32)
Calcium: 8.4 mg/dL — ABNORMAL LOW (ref 8.9–10.3)
Chloride: 108 mmol/L (ref 98–111)
Creatinine, Ser: 1.15 mg/dL — ABNORMAL HIGH (ref 0.44–1.00)
GFR calc Af Amer: >60 mL/min (ref >60)
GFR calc non Af Amer: 54 mL/min — ABNORMAL LOW (ref >60)
Glucose, Bld: 91 mg/dL (ref 70–99)
Potassium: 3.9 mmol/L (ref 3.5–5.1)
Sodium: 140 mmol/L (ref 135–145)
Total Bilirubin: 0.6 mg/dL (ref 0.3–1.2)
Total Protein: 6.9 g/dL (ref 6.5–8.1)

## 2018-07-15 LAB — URINALYSIS, ROUTINE W REFLEX MICROSCOPIC
Bilirubin Urine: NEGATIVE
Glucose, UA: NEGATIVE mg/dL
Hgb urine dipstick: NEGATIVE
Ketones, ur: NEGATIVE mg/dL
Leukocytes,Ua: NEGATIVE
Nitrite: NEGATIVE
Protein, ur: NEGATIVE mg/dL
Specific Gravity, Urine: 1.012 (ref 1.005–1.030)
pH: 7 (ref 5.0–8.0)

## 2018-07-15 LAB — GLUCOSE, CAPILLARY: Glucose-Capillary: 106 mg/dL — ABNORMAL HIGH (ref 70–99)

## 2018-07-15 SURGERY — ESOPHAGOGASTRODUODENOSCOPY (EGD) WITH PROPOFOL
Anesthesia: Monitor Anesthesia Care

## 2018-07-15 MED ORDER — GUAIFENESIN ER 1200 MG PO TB12: 1.0000 | ORAL_TABLET | Freq: Two times a day (BID) | ORAL | 0 refills | Status: DC

## 2018-07-15 MED ORDER — SODIUM CHLORIDE 0.9 % IV SOLN: INTRAVENOUS | Status: DC

## 2018-07-15 MED ORDER — PREDNISONE 50 MG PO TABS: 50.0000 mg | ORAL_TABLET | Freq: Every day | ORAL | 0 refills | Status: DC

## 2018-07-15 MED ORDER — IPRATROPIUM BROMIDE 0.02 % IN SOLN
0.5000 mg | Freq: Once | RESPIRATORY_TRACT | Status: AC
  Administered 2018-07-15: 0.5 mg via RESPIRATORY_TRACT
  Filled 2018-07-15: qty 2.5

## 2018-07-15 MED ORDER — PREDNISONE 20 MG PO TABS
60.0000 mg | ORAL_TABLET | Freq: Once | ORAL | Status: AC
  Administered 2018-07-15: 60 mg via ORAL
  Filled 2018-07-15: qty 3

## 2018-07-15 MED ORDER — PROPOFOL 500 MG/50ML IV EMUL
INTRAVENOUS | Status: DC | PRN
  Administered 2018-07-15: 175 ug/kg/min via INTRAVENOUS

## 2018-07-15 MED ORDER — ALBUTEROL SULFATE (2.5 MG/3ML) 0.083% IN NEBU
5.0000 mg | INHALATION_SOLUTION | Freq: Once | RESPIRATORY_TRACT | Status: AC
  Administered 2018-07-15: 5 mg via RESPIRATORY_TRACT
  Filled 2018-07-15: qty 6

## 2018-07-15 MED ORDER — OMEPRAZOLE MAGNESIUM 20 MG PO TBEC: 20.0000 mg | DELAYED_RELEASE_TABLET | Freq: Every day | ORAL | Status: DC

## 2018-07-15 MED ORDER — LACTATED RINGERS IV SOLN
INTRAVENOUS | Status: DC
  Administered 2018-07-15: 08:00:00 via INTRAVENOUS

## 2018-07-15 MED ORDER — SODIUM CHLORIDE (PF) 0.9 % IJ SOLN
INTRAMUSCULAR | Status: AC
  Filled 2018-07-15: qty 50

## 2018-07-15 MED ORDER — IOHEXOL 350 MG/ML SOLN
100.0000 mL | Freq: Once | INTRAVENOUS | Status: AC | PRN
  Administered 2018-07-15: 100 mL via INTRAVENOUS

## 2018-07-15 MED ORDER — IOPAMIDOL (ISOVUE-370) INJECTION 76%: 100.0000 mL | Freq: Once | INTRAVENOUS | Status: DC | PRN

## 2018-07-15 SURGICAL SUPPLY — 15 items

## 2018-07-15 NOTE — Transfer of Care (Signed)
Immediate Anesthesia Transfer of Care Note  Patient: Erika Woods  Procedure(s) Performed: ESOPHAGOGASTRODUODENOSCOPY (EGD) WITH PROPOFOL (N/A )  Patient Location: PACU  Anesthesia Type:MAC  Level of Consciousness: sedated  Airway & Oxygen Therapy: Patient Spontanous Breathing and Patient connected to face mask oxygen  Post-op Assessment: Report given to RN and Post -op Vital signs reviewed and stable  Post vital signs: Reviewed and stable  Last Vitals:  Vitals Value Taken Time  BP    Temp    Pulse 71 07/15/2018  9:00 AM  Resp 17 07/15/2018  9:00 AM  SpO2 95 % 07/15/2018  9:00 AM  Vitals shown include unvalidated device data.  Last Pain:  Vitals:   07/15/18 0726  TempSrc: Oral  PainSc: 0-No pain         Complications: No apparent anesthesia complications

## 2018-07-15 NOTE — Op Note (Signed)
[facility] Patient Name: Erika Woods Procedure Date: 07/15/2018 MRN: [id number] Attending MD: Willis Modena , MD Date of Birth: 05/13/1965 CSN: [id number] Age: 53 Admit Type: Outpatient Procedure:                Upper GI endoscopy Indications:              Screening procedure, cirrhosis Providers:                Willis Modena, MD, Tillie Fantasia, RN, Beryle Beams, Technician, Harrington Challenger, Technician,                            Mirian Mo, CRNA Referring MD:              Medicines:                Monitored Anesthesia Care Complications:            No immediate complications. Estimated Blood Loss:     Estimated blood loss: none. Procedure:                Pre-Anesthesia Assessment:                           - Prior to the procedure, a History and Physical                            was performed, and patient medications and                            allergies were reviewed. The patient's tolerance of                            previous anesthesia was also reviewed. The risks                            and benefits of the procedure and the sedation                            options and risks were discussed with the patient.                            All questions were answered, and informed consent                            was obtained. Prior Anticoagulants: The patient has                            taken no previous anticoagulant or antiplatelet                            agents. ASA Grade Assessment: III - A patient with                            severe  systemic disease. After reviewing the risks                            and benefits, the patient was deemed in                            satisfactory condition to undergo the procedure.                           After obtaining informed consent, the endoscope was                            passed under direct vision. Throughout the                            procedure, the  patient's blood pressure, pulse, and                            oxygen saturations were monitored continuously. The                            GIF-H190 ([id number]) Olympus gastroscope was                            introduced through the mouth, and advanced to the                            second part of duodenum. The upper GI endoscopy was                            accomplished without difficulty. The patient                            tolerated the procedure well. Scope In: Scope Out: Findings:      Small hiatal hernia.      LA Grade C (one or more mucosal breaks continuous between tops of 2 or       more mucosal folds, less than 75% circumference) esophagitis was found.      The exam of the esophagus was otherwise normal. No esophageal varices       noted.      The entire examined stomach was normal. No gastric varices noted.      The duodenal bulb, first portion of the duodenum and second portion of       the duodenum were normal. Impression:               - Small hiatal hernia.                           - LA Grade C esophagitis.                           - Normal stomach.                           - Normal duodenal bulb, first portion of the  duodenum and second portion of the duodenum. Moderate Sedation:      Not Applicable - Patient had care per Anesthesia. Recommendation:           - Patient has a contact number available for                            emergencies. The signs and symptoms of potential                            delayed complications were discussed with the                            patient. Return to normal activities tomorrow.                            Written discharge instructions were provided to the                            patient.                           - Discharge patient to home (via wheelchair).                           - Resume previous diet today.                           - Follow an antireflux regimen  indefinitely.                           - Use Prilosec (omeprazole) 20 mg PO daily until                            further notice.                           - Return to GI clinic in 3 months.                           - Return to referring physician as previously                            scheduled. Procedure Code(s):        --- Professional ---                           [id number], Esophagogastroduodenoscopy, flexible,                            transoral; diagnostic, including collection of                            specimen(s) by brushing or washing, when performed                            (separate procedure) Diagnosis Code(s):        ---  Professional ---                           K20.9, Esophagitis, unspecified                           Z13.810, Encounter for screening for upper                            gastrointestinal disorder CPT copyright 2018 [employer] Association. All rights reserved. The codes documented in this report are preliminary and upon coder review may  be revised to meet current compliance requirements. Willis Modena, MD 07/15/2018 9:06:26 AM This report has been signed electronically. Number of Addenda: 0

## 2018-07-15 NOTE — ED Provider Notes (Signed)
[facility]-EMERGENCY DEPT Provider Note   CSN: [id number] Arrival date & time: 07/15/18  1435    History   Chief Complaint Chief Complaint  Patient presents with  . Shortness of Breath  . Wheezing    HPI Erika Woods is a 53 y.o. female.     HPI Patient presents to the emergency department with shortness of breath with coughing and felt like she was wheezing after getting home today from an endoscopy.  Patient states that she did not take any medications other than using her inhaler one time prior to arrival.  Patient states that nothing seemed to make the condition better or worse.  The patient denies chest pain, headache,blurred vision, neck pain, fever, weakness, numbness, dizziness, anorexia, edema, abdominal pain, nausea, vomiting, diarrhea, rash, back pain, dysuria, hematemesis, bloody stool, near syncope, or syncope. Past Medical History:  Diagnosis Date  . Diabetes mellitus without complication ([name])   . Impaired fasting glucose   . Obstructive sleep apnea on CPAP   . Osteoarthritis of knee   . Personal history of pulmonary embolism   . Renal disorder    kideny stones  . Restless leg syndrome   . Thoracic aortic aneurysm [facility]     Patient Active Problem List   Diagnosis Date Noted  . Severe recurrent major depression without psychotic features ([name]) 02/17/2018  . Obesity, Class III, BMI 40-49.9 (morbid obesity) ([name]) 09/16/2017  . Acute pulmonary embolism with acute cor pulmonale ([name]) 09/16/2017  . Acute superficial venous thrombosis of left lower extremity   . Thoracic aortic aneurysm without rupture ([name])   . Pulmonary emboli ([name]) 09/15/2017  . PTSD (post-traumatic stress disorder) 08/18/2011  . DM (diabetes mellitus) ([name]) 08/18/2011  . Sleep apnea 08/18/2011  . Mood disorder ([name]) 08/18/2011    Past Surgical History:  Procedure Laterality Date  . arm surgery Bilateral Skin removal  . DILATATION & CURETTAGE/HYSTEROSCOPY WITH  MYOSURE N/A 06/11/2018   Procedure: DILATATION & CURETTAGE/HYSTEROSCOPY WITH MYOSURE RESECTION OF ENDOMETRIAL THICKENING;  Surgeon: Richardean Chimera, MD;  Location: [facility];  Service: Gynecology;  Laterality: N/A;  BMI 56  . DILATION AND EVACUATION    . KIDNEY STONE SURGERY    . KNEE SURGERY Right   . TONSILLECTOMY       OB History   No obstetric history on file.      Home Medications    Prior to Admission medications   Medication Sig Start Date End Date Taking? Authorizing Provider  albuterol (PROVENTIL HFA;VENTOLIN HFA) 108 (90 Base) MCG/ACT inhaler Inhale 1-2 puffs into the lungs every 6 (six) hours as needed for wheezing or shortness of breath. 02/06/18  Yes Arby Barrette, MD  cetirizine (ZYRTEC) 10 MG tablet Take 10 mg by mouth daily.   Yes [provider]  lamoTRIgine (LAMICTAL) 150 MG tablet Take 150 mg by mouth daily. 06/30/18  Yes [provider]  metFORMIN (GLUCOPHAGE) 500 MG tablet Take 500 mg by mouth 2 (two) times daily with a meal.  10/28/13  Yes [provider]  rOPINIRole (REQUIP XL) 4 MG 24 hr tablet Take 4 mg by mouth at bedtime.    Yes [provider]  traZODone (DESYREL) 100 MG tablet Take 200 mg by mouth at bedtime.    Yes [provider]  vortioxetine HBr (TRINTELLIX) 10 MG TABS tablet Take 2 tablets (20 mg total) by mouth daily. Patient taking differently: Take 10 mg by mouth daily.  02/22/18  Yes Charm Rings, NP  omeprazole (PRILOSEC OTC) 20 MG tablet Take 1 tablet (20 mg total) by mouth daily. 07/15/18   Willis Modena, MD    Family History Family History  Adopted: Yes    Social History Social History   Tobacco Use  . Smoking status: Never Smoker  . Smokeless tobacco: Never Used  Substance Use Topics  . Alcohol use: Yes    Comment: "wine once or twice a month"  . Drug use: No     Allergies   Sulfa antibiotics   Review of Systems Review of Systems All other systems negative except as documented in  the HPI. All pertinent positives and negatives as reviewed in the HPI.  Physical Exam Updated Vital Signs BP 120/76   Pulse 75   Temp 99 F (37.2 C) (Oral)   Resp 17   Ht  (1.651 m)   Wt (!) 145.2 kg   LMP 01/29/2013   SpO2 94%   BMI 53.25 kg/m   Physical Exam Vitals signs and nursing note reviewed.  Constitutional:      General: She is not in acute distress.    Appearance: She is well-developed.  HENT:     Head: Normocephalic and atraumatic.  Eyes:     Pupils: Pupils are equal, round, and reactive to light.  Neck:     Musculoskeletal: Normal range of motion and neck supple.  Cardiovascular:     Rate and Rhythm: Normal rate and regular rhythm.     Heart sounds: Normal heart sounds. No murmur. No friction rub. No gallop.   Pulmonary:     Effort: Pulmonary effort is normal. Tachypnea present. No respiratory distress.     Breath sounds: Normal breath sounds. Transmitted upper airway sounds present. No decreased breath sounds, wheezing, rhonchi or rales.  Abdominal:     General: Bowel sounds are normal. There is no distension.     Palpations: Abdomen is soft.     Tenderness: There is no abdominal tenderness.  Skin:    General: Skin is warm and dry.     Capillary Refill: Capillary refill takes less than 2 seconds.     Findings: No erythema or rash.  Neurological:     Mental Status: She is alert and oriented to person, place, and time.     Motor: No abnormal muscle tone.     Coordination: Coordination normal.  Psychiatric:        Behavior: Behavior normal.      ED Treatments / Results  Labs (all labs ordered are listed, but only abnormal results are displayed) Labs Reviewed  CBC WITH DIFFERENTIAL/PLATELET - Abnormal; Notable for the following components:      Result Value   Hemoglobin 11.3 (*)    MCHC 29.3 (*)    All other components within normal limits  URINALYSIS, ROUTINE W REFLEX MICROSCOPIC - Abnormal; Notable for the following components:   Color,  Urine STRAW (*)    All other components within normal limits  COMPREHENSIVE METABOLIC PANEL    EKG None  Radiology Dg Chest 2 View  Result Date: 07/15/2018 CLINICAL DATA:  Initial evaluation for acute shortness of breath, cough, wheezing, low grade fever. EXAM: CHEST - 2 VIEW COMPARISON:  Prior radiograph from 06/14/2018 FINDINGS: Transverse heart size within normal limits. Mediastinal silhouette normal. Aortic atherosclerosis. Lungs normally inflated. Mild scattered subsegmental atelectasis present within the mid and lower lungs bilaterally. No consolidative opacity. No edema or effusion. No pneumothorax. No acute osseous finding. IMPRESSION: 1. Mild scattered subsegmental atelectasis within  the mid and lower lungs bilaterally. 2. No other active cardiopulmonary disease. Electronically Signed   By: Rise Mu M.D.   On: 07/15/2018 15:30    Procedures Procedures (including critical care time)  Medications Ordered in ED Medications - No data to display   Initial Impression / Assessment and Plan / ED Course  I have reviewed the triage vital signs and the nursing notes.  Pertinent labs & imaging results that were available during my care of the patient were reviewed by me and considered in my medical decision making (see chart for details).        Patient will be evaluated with CTA of her chest.  She has had pulmonary embolus in the past.  I do not hear any overt wheezing or other abnormal breath sounds.  There is some upper airway wheezing type sounds.  She states that her throat is not sore from the endoscopy.  Final Clinical Impressions(s) / ED Diagnoses   Final diagnoses:  None    ED Discharge Orders    None       Charlestine Night, Cordelia Poche 07/15/18 2031    Alvira Monday, MD 07/16/18 [id number]

## 2018-07-15 NOTE — Anesthesia Preprocedure Evaluation (Signed)
Anesthesia Evaluation  Patient identified by MRN, date of birth, ID band Patient awake    Reviewed: Allergy & Precautions, NPO status , Patient's Chart, lab work & pertinent test results  Airway Mallampati: I  TM Distance: >3 FB Neck ROM: Full    Dental no notable dental hx. (+) Teeth Intact   Pulmonary sleep apnea ,    Pulmonary exam normal breath sounds clear to auscultation       Cardiovascular negative cardio ROS Normal cardiovascular exam Rhythm:Regular Rate:Normal  Hx/o PTE Hx/o thoracic aortic aneurysm   Neuro/Psych PSYCHIATRIC DISORDERS Anxiety Depression PTSDRestless legs syndrome    GI/Hepatic negative GI ROS, (+) Cirrhosis       ,   Endo/Other  diabetes, Well Controlled, Type 2, Oral Hypoglycemic AgentsMorbid obesity  Renal/GU Renal diseaseHx/o renal calculi     Musculoskeletal  (+) Arthritis , Osteoarthritis,    Abdominal (+) + obese,   Peds  Hematology   Anesthesia Other Findings   Reproductive/Obstetrics                             Anesthesia Physical Anesthesia Plan  ASA: III  Anesthesia Plan: MAC   Post-op Pain Management:    Induction: Intravenous  PONV Risk Score and Plan: Ondansetron, Propofol infusion and Treatment may vary due to age or medical condition  Airway Management Planned: Natural Airway and Nasal Cannula  Additional Equipment:   Intra-op Plan:   Post-operative Plan:   Informed Consent: I have reviewed the patients History and Physical, chart, labs and discussed the procedure including the risks, benefits and alternatives for the proposed anesthesia with the patient or authorized representative who has indicated his/her understanding and acceptance.     Dental advisory given  Plan Discussed with: CRNA and Surgeon  Anesthesia Plan Comments:         Anesthesia Quick Evaluation

## 2018-07-15 NOTE — Anesthesia Postprocedure Evaluation (Signed)
Anesthesia Post Note  Patient: Erika Woods  Procedure(s) Performed: ESOPHAGOGASTRODUODENOSCOPY (EGD) WITH PROPOFOL (N/A )     Patient location during evaluation: PACU Anesthesia Type: MAC Level of consciousness: awake and alert and oriented Pain management: pain level controlled Vital Signs Assessment: post-procedure vital signs reviewed and stable Respiratory status: spontaneous breathing, nonlabored ventilation and respiratory function stable Cardiovascular status: stable and blood pressure returned to baseline Postop Assessment: no apparent nausea or vomiting Anesthetic complications: no    Last Vitals:  Vitals:   07/15/18 0910 07/15/18 0920  BP: 124/63 126/62  Pulse: 68 (!) 59  Resp: (!) 22 17  Temp:    SpO2: 92% 97%    Last Pain:  Vitals:   07/15/18 0900  TempSrc: Oral  PainSc: 0-No pain                 Miyani Cronic A.

## 2018-07-15 NOTE — Discharge Instructions (Signed)
YOU HAD AN ENDOSCOPIC PROCEDURE TODAY: Refer to the procedure report and other information in the discharge instructions given to you for any specific questions about what was found during the examination. If this information does not answer your questions, please call [facility] office at [phone] to clarify.   YOU SHOULD EXPECT: Some feelings of bloating in the abdomen. Passage of more gas than usual. Walking can help get rid of the air that was put into your GI tract during the procedure and reduce the bloating.   DIET: Your first meal following the procedure should be a light meal and then it is ok to progress to your normal diet. A half-sandwich or bowl of soup is an example of a good first meal. Heavy or fried foods are harder to digest and may make you feel nauseous or bloated. Drink plenty of fluids but you should avoid alcoholic beverages for 24 hours. If you had a esophageal dilation, please see attached instructions for diet.    ACTIVITY: Your care partner should take you home directly after the procedure. You should plan to take it easy, moving slowly for the rest of the day. You can resume normal activity the day after the procedure however YOU SHOULD NOT DRIVE, use power tools, machinery or perform tasks that involve climbing or major physical exertion for 24 hours (because of the sedation medicines used during the test).   SYMPTOMS TO REPORT IMMEDIATELY: A gastroenterologist can be reached at any hour. Please call [phone]  for any of the following symptoms:   Following upper endoscopy (EGD, EUS, ERCP, esophageal dilation) Vomiting of blood or coffee ground material  New, significant abdominal pain  New, significant chest pain or pain under the shoulder blades  Painful or persistently difficult swallowing  New shortness of breath  Black, tarry-looking or red, bloody stools  FOLLOW UP:  If any biopsies were taken you will be contacted by phone or by letter within the next 1-3  weeks. Call [phone]  if you have not heard about the biopsies in 3 weeks.  Please also call with any specific questions about appointments or follow up tests.  

## 2018-07-15 NOTE — ED Triage Notes (Signed)
Pt complains of low grade fever, coughing, wheezing since getting home after an endoscopy today. Pt states procedure went normal and felt fine when she left.

## 2018-07-15 NOTE — H&P (Signed)
Patient interval history reviewed.  Patient examined again.  There has been no change from documented H/P (scanned into chart from our office) except as documented above.  Assessment:  1.  Cirrhosis.  Plan:  1.  Endoscopy for variceal screening. 2.  Risks (bleeding, infection, bowel perforation that could require surgery, sedation-related changes in cardiopulmonary systems), benefits (identification and possible treatment of source of symptoms, exclusion of certain causes of symptoms), and alternatives (watchful waiting, radiographic imaging studies, empiric medical treatment) of upper endoscopy (EGD) were explained to patient/family in detail and patient wishes to proceed.

## 2018-07-17 ENCOUNTER — Encounter (HOSPITAL_COMMUNITY): Payer: Self-pay | Admitting: Gastroenterology

## 2018-07-22 ENCOUNTER — Other Ambulatory Visit: Payer: Self-pay | Admitting: Orthopaedic Surgery

## 2018-07-22 DIAGNOSIS — M5410 Radiculopathy, site unspecified: Secondary | ICD-10-CM

## 2018-07-22 DIAGNOSIS — M545 Low back pain, unspecified: Secondary | ICD-10-CM

## 2018-07-30 ENCOUNTER — Ambulatory Visit
Admission: RE | Admit: 2018-07-30 | Discharge: 2018-07-30 | Disposition: A | Discharge status: Home / Self Care | Payer: BLUE CROSS/BLUE SHIELD | Source: Ambulatory Visit | Attending: Orthopaedic Surgery | Admitting: Orthopaedic Surgery

## 2018-07-30 ENCOUNTER — Other Ambulatory Visit: Payer: Self-pay

## 2018-07-30 DIAGNOSIS — M545 Low back pain, unspecified: Secondary | ICD-10-CM

## 2018-07-30 DIAGNOSIS — M5410 Radiculopathy, site unspecified: Secondary | ICD-10-CM

## 2018-11-11 ENCOUNTER — Inpatient Hospital Stay (HOSPITAL_COMMUNITY)
Admission: EM | Admit: 2018-11-11 | Discharge: 2018-11-12 | DRG: 176 | Disposition: A | Discharge status: Home / Self Care | Payer: BC Managed Care – PPO | Attending: Student | Admitting: Student

## 2018-11-11 ENCOUNTER — Other Ambulatory Visit: Payer: Self-pay

## 2018-11-11 ENCOUNTER — Emergency Department (HOSPITAL_COMMUNITY): Payer: BC Managed Care – PPO

## 2018-11-11 ENCOUNTER — Encounter (HOSPITAL_COMMUNITY): Payer: Self-pay | Admitting: Emergency Medicine

## 2018-11-11 DIAGNOSIS — Z79899 Other long term (current) drug therapy: Secondary | ICD-10-CM | POA: Diagnosis not present

## 2018-11-11 DIAGNOSIS — R000 Tachycardia, unspecified: Secondary | ICD-10-CM | POA: Diagnosis present

## 2018-11-11 DIAGNOSIS — I452 Bifascicular block: Secondary | ICD-10-CM | POA: Diagnosis present

## 2018-11-11 DIAGNOSIS — G2581 Restless legs syndrome: Secondary | ICD-10-CM | POA: Diagnosis present

## 2018-11-11 DIAGNOSIS — G4733 Obstructive sleep apnea (adult) (pediatric): Secondary | ICD-10-CM | POA: Diagnosis present

## 2018-11-11 DIAGNOSIS — Z1159 Encounter for screening for other viral diseases: Secondary | ICD-10-CM

## 2018-11-11 DIAGNOSIS — Z7984 Long term (current) use of oral hypoglycemic drugs: Secondary | ICD-10-CM

## 2018-11-11 DIAGNOSIS — E1169 Type 2 diabetes mellitus with other specified complication: Secondary | ICD-10-CM

## 2018-11-11 DIAGNOSIS — Z86711 Personal history of pulmonary embolism: Secondary | ICD-10-CM | POA: Diagnosis not present

## 2018-11-11 DIAGNOSIS — I712 Thoracic aortic aneurysm, without rupture: Secondary | ICD-10-CM | POA: Diagnosis present

## 2018-11-11 DIAGNOSIS — R9389 Abnormal findings on diagnostic imaging of other specified body structures: Secondary | ICD-10-CM | POA: Diagnosis present

## 2018-11-11 DIAGNOSIS — Z7952 Long term (current) use of systemic steroids: Secondary | ICD-10-CM

## 2018-11-11 DIAGNOSIS — F332 Major depressive disorder, recurrent severe without psychotic features: Secondary | ICD-10-CM | POA: Diagnosis present

## 2018-11-11 DIAGNOSIS — R0602 Shortness of breath: Secondary | ICD-10-CM | POA: Diagnosis present

## 2018-11-11 DIAGNOSIS — Z6841 Body Mass Index (BMI) 40.0 and over, adult: Secondary | ICD-10-CM | POA: Diagnosis not present

## 2018-11-11 DIAGNOSIS — Z9989 Dependence on other enabling machines and devices: Secondary | ICD-10-CM | POA: Diagnosis not present

## 2018-11-11 DIAGNOSIS — F4310 Post-traumatic stress disorder, unspecified: Secondary | ICD-10-CM | POA: Diagnosis present

## 2018-11-11 DIAGNOSIS — I2699 Other pulmonary embolism without acute cor pulmonale: Principal | ICD-10-CM | POA: Diagnosis present

## 2018-11-11 DIAGNOSIS — E119 Type 2 diabetes mellitus without complications: Secondary | ICD-10-CM | POA: Diagnosis present

## 2018-11-11 DIAGNOSIS — Z86718 Personal history of other venous thrombosis and embolism: Secondary | ICD-10-CM | POA: Diagnosis not present

## 2018-11-11 HISTORY — DX: Other pulmonary embolism without acute cor pulmonale: I26.99

## 2018-11-11 LAB — TROPONIN I (HIGH SENSITIVITY)
Troponin I (High Sensitivity): 14 ng/L (ref <18)
Troponin I (High Sensitivity): 13 ng/L (ref <18)

## 2018-11-11 LAB — CBC WITH DIFFERENTIAL/PLATELET
Abs Immature Granulocytes: 0.07 10*3/uL (ref 0.00–0.07)
Basophils Absolute: 0 10*3/uL (ref 0.0–0.1)
Basophils Relative: 0 %
Eosinophils Absolute: 0 10*3/uL (ref 0.0–0.5)
Eosinophils Relative: 0 %
HCT: 37.4 % (ref 36.0–46.0)
Hemoglobin: 11.9 g/dL — ABNORMAL LOW (ref 12.0–15.0)
Immature Granulocytes: 1 %
Lymphocytes Relative: 24 %
Lymphs Abs: 2.3 10*3/uL (ref 0.7–4.0)
MCH: 28.7 pg (ref 26.0–34.0)
MCHC: 31.8 g/dL (ref 30.0–36.0)
MCV: 90.3 fL (ref 80.0–100.0)
Monocytes Absolute: 0.6 10*3/uL (ref 0.1–1.0)
Monocytes Relative: 6 %
Neutro Abs: 6.6 10*3/uL (ref 1.7–7.7)
Neutrophils Relative %: 69 %
Platelets: 228 10*3/uL (ref 150–400)
RBC: 4.14 MIL/uL (ref 3.87–5.11)
RDW: 15.9 % — ABNORMAL HIGH (ref 11.5–15.5)
WBC: 9.6 10*3/uL (ref 4.0–10.5)
nRBC: 0 % (ref 0.0–0.2)

## 2018-11-11 LAB — HEPATIC FUNCTION PANEL
ALT: 31 U/L (ref 0–44)
AST: 25 U/L (ref 15–41)
Albumin: 3.9 g/dL (ref 3.5–5.0)
Alkaline Phosphatase: 74 U/L (ref 38–126)
Bilirubin, Direct: 0.1 mg/dL (ref 0.0–0.2)
Indirect Bilirubin: 0.1 mg/dL — ABNORMAL LOW (ref 0.3–0.9)
Total Bilirubin: 0.2 mg/dL — ABNORMAL LOW (ref 0.3–1.2)
Total Protein: 6.6 g/dL (ref 6.5–8.1)

## 2018-11-11 LAB — BASIC METABOLIC PANEL
Anion gap: 8 (ref 5–15)
BUN: 21 mg/dL — ABNORMAL HIGH (ref 6–20)
CO2: 25 mmol/L (ref 22–32)
Calcium: 8.6 mg/dL — ABNORMAL LOW (ref 8.9–10.3)
Chloride: 108 mmol/L (ref 98–111)
Creatinine, Ser: 1.17 mg/dL — ABNORMAL HIGH (ref 0.44–1.00)
GFR calc Af Amer: >60 mL/min (ref >60)
GFR calc non Af Amer: 53 mL/min — ABNORMAL LOW (ref >60)
Glucose, Bld: 107 mg/dL — ABNORMAL HIGH (ref 70–99)
Potassium: 3.8 mmol/L (ref 3.5–5.1)
Sodium: 141 mmol/L (ref 135–145)

## 2018-11-11 LAB — PHOSPHORUS: Phosphorus: 3 mg/dL (ref 2.5–4.6)

## 2018-11-11 LAB — MAGNESIUM: Magnesium: 2 mg/dL (ref 1.7–2.4)

## 2018-11-11 LAB — BRAIN NATRIURETIC PEPTIDE: B Natriuretic Peptide: 70.1 pg/mL (ref 0.0–100.0)

## 2018-11-11 LAB — CBG MONITORING, ED: Glucose-Capillary: 110 mg/dL — ABNORMAL HIGH (ref 70–99)

## 2018-11-11 LAB — SARS CORONAVIRUS 2 BY RT PCR (HOSPITAL ORDER, PERFORMED IN ~~LOC~~ HOSPITAL LAB): SARS Coronavirus 2: NEGATIVE

## 2018-11-11 MED ORDER — ROPINIROLE HCL ER 4 MG PO TB24
8.0000 mg | ORAL_TABLET | Freq: Every day | ORAL | Status: DC
  Administered 2018-11-11: 8 mg via ORAL
  Filled 2018-11-11: qty 2

## 2018-11-11 MED ORDER — LAMOTRIGINE 25 MG PO TABS
150.0000 mg | ORAL_TABLET | Freq: Every day | ORAL | Status: DC
  Administered 2018-11-12: 150 mg via ORAL
  Filled 2018-11-11 (×2): qty 2

## 2018-11-11 MED ORDER — HEPARIN (PORCINE) 25000 UT/250ML-% IV SOLN
1750.0000 [IU]/h | INTRAVENOUS | Status: DC
  Administered 2018-11-11: 1750 [IU]/h via INTRAVENOUS
  Filled 2018-11-11: qty 250

## 2018-11-11 MED ORDER — INSULIN ASPART 100 UNIT/ML ~~LOC~~ SOLN
0.0000 [IU] | Freq: Three times a day (TID) | SUBCUTANEOUS | Status: DC
  Filled 2018-11-11: qty 0.09

## 2018-11-11 MED ORDER — SODIUM CHLORIDE (PF) 0.9 % IJ SOLN
INTRAMUSCULAR | Status: AC
  Administered 2018-11-11: 20:00:00
  Filled 2018-11-11: qty 50

## 2018-11-11 MED ORDER — INSULIN ASPART 100 UNIT/ML ~~LOC~~ SOLN
0.0000 [IU] | Freq: Every day | SUBCUTANEOUS | Status: DC
  Filled 2018-11-11: qty 0.05

## 2018-11-11 MED ORDER — IOHEXOL 350 MG/ML SOLN
100.0000 mL | Freq: Once | INTRAVENOUS | Status: AC | PRN
  Administered 2018-11-11: 100 mL via INTRAVENOUS

## 2018-11-11 MED ORDER — HEPARIN BOLUS VIA INFUSION
3000.0000 [IU] | Freq: Once | INTRAVENOUS | Status: AC
  Administered 2018-11-11: 3000 [IU] via INTRAVENOUS
  Filled 2018-11-11: qty 3000

## 2018-11-11 MED ORDER — VORTIOXETINE HBR 5 MG PO TABS
20.0000 mg | ORAL_TABLET | Freq: Every day | ORAL | Status: DC
  Administered 2018-11-12: 20 mg via ORAL
  Filled 2018-11-11: qty 4

## 2018-11-11 MED ORDER — GABAPENTIN 300 MG PO CAPS
600.0000 mg | ORAL_CAPSULE | Freq: Every day | ORAL | Status: DC
  Administered 2018-11-11: 600 mg via ORAL
  Filled 2018-11-11: qty 2

## 2018-11-11 MED ORDER — IPRATROPIUM-ALBUTEROL 0.5-2.5 (3) MG/3ML IN SOLN
3.0000 mL | Freq: Four times a day (QID) | RESPIRATORY_TRACT | Status: DC | PRN
  Administered 2018-11-12: 3 mL via RESPIRATORY_TRACT
  Filled 2018-11-11: qty 3

## 2018-11-11 MED ORDER — METFORMIN HCL 500 MG PO TABS: 500.0000 mg | ORAL_TABLET | Freq: Two times a day (BID) | ORAL | Status: DC

## 2018-11-11 MED ORDER — LORATADINE 10 MG PO TABS
10.0000 mg | ORAL_TABLET | Freq: Every day | ORAL | Status: DC
  Administered 2018-11-12: 09:00:00 10 mg via ORAL
  Filled 2018-11-11 (×2): qty 1

## 2018-11-11 MED ORDER — PANTOPRAZOLE SODIUM 40 MG PO TBEC
40.0000 mg | DELAYED_RELEASE_TABLET | Freq: Every day | ORAL | Status: DC
  Administered 2018-11-12: 40 mg via ORAL
  Filled 2018-11-11: qty 1

## 2018-11-11 MED ORDER — TRAZODONE HCL 50 MG PO TABS
150.0000 mg | ORAL_TABLET | Freq: Every day | ORAL | Status: DC
  Administered 2018-11-11: 150 mg via ORAL
  Filled 2018-11-11: qty 1

## 2018-11-11 MED ORDER — ROPINIROLE HCL ER 8 MG PO TB24
8.0000 mg | ORAL_TABLET | Freq: Every day | ORAL | Status: DC
  Filled 2018-11-11: qty 1

## 2018-11-11 NOTE — ED Provider Notes (Signed)
[facility]-EMERGENCY DEPT Provider Note   CSN: [id number] Arrival date & time: 11/11/18  1752    History   Chief Complaint Chief Complaint  Patient presents with  . Shortness of Breath  . Tachycardia    HPI Erika Woods is a 53 y.o. female.     The history is provided by the patient.  Shortness of Breath Severity:  Moderate Onset quality:  Gradual Timing:  Constant Progression:  Unchanged Chronicity:  New Context: activity   Relieved by:  Nothing Worsened by:  Nothing Ineffective treatments:  None tried Associated symptoms: wheezing (mild)   Associated symptoms: no abdominal pain, no chest pain, no cough, no ear pain, no fever, no rash, no sore throat and no vomiting   Risk factors: hx of PE/DVT (no longer on anticoag)     Past Medical History:  Diagnosis Date  . Diabetes mellitus without complication ([name])   . Impaired fasting glucose   . Obstructive sleep apnea on CPAP   . Osteoarthritis of knee   . Personal history of pulmonary embolism   . Renal disorder    kideny stones  . Restless leg syndrome   . Thoracic aortic aneurysm [facility]([name])     Patient Active Problem List   Diagnosis Date Noted  . Severe recurrent major depression without psychotic features ([name]) 02/17/2018  . Obesity, Class III, BMI 40-49.9 (morbid obesity) ([name]) 09/16/2017  . Acute pulmonary embolism with acute cor pulmonale ([name]) 09/16/2017  . Acute superficial venous thrombosis of left lower extremity   . Thoracic aortic aneurysm without rupture ([name])   . Pulmonary emboli ([name]) 09/15/2017  . PTSD (post-traumatic stress disorder) 08/18/2011  . DM (diabetes mellitus) ([name]) 08/18/2011  . Sleep apnea 08/18/2011  . Mood disorder ([name]) 08/18/2011    Past Surgical History:  Procedure Laterality Date  . arm surgery Bilateral Skin removal  . DILATATION & CURETTAGE/HYSTEROSCOPY WITH MYOSURE N/A 06/11/2018   Procedure: DILATATION & CURETTAGE/HYSTEROSCOPY WITH MYOSURE  RESECTION OF ENDOMETRIAL THICKENING;  Surgeon: Richardean ChimeraMcComb, John, MD;  Location: [facility] ORS;  Service: Gynecology;  Laterality: N/A;  BMI 56  . DILATION AND EVACUATION    . ESOPHAGOGASTRODUODENOSCOPY (EGD) WITH PROPOFOL N/A 07/15/2018   Procedure: ESOPHAGOGASTRODUODENOSCOPY (EGD) WITH PROPOFOL;  Surgeon: [name], [name], MD;  Location: [facility];  Service: Endoscopy;  Laterality: N/A;  . KIDNEY STONE SURGERY    . KNEE SURGERY Right   . TONSILLECTOMY       OB History   No obstetric history on file.      Home Medications    Prior to Admission medications   Medication Sig Start Date End Date Taking? Authorizing Provider  albuterol (PROVENTIL HFA;VENTOLIN HFA) 108 (90 Base) MCG/ACT inhaler Inhale 1-2 puffs into the lungs every 6 (six) hours as needed for wheezing or shortness of breath. 02/06/18   Arby BarrettePfeiffer, Marcy, MD  cetirizine (ZYRTEC) 10 MG tablet Take 10 mg by mouth daily.    [provider]  Guaifenesin 1200 MG TB12 Take 1 tablet (1,200 mg total) by mouth 2 (two) times daily. 07/15/18   Lawyer, Cristal Deerhristopher, PA-C  lamoTRIgine (LAMICTAL) 150 MG tablet Take 150 mg by mouth daily. 06/30/18   [provider]  metFORMIN (GLUCOPHAGE) 500 MG tablet Take 500 mg by mouth 2 (two) times daily with a meal.  10/28/13   [provider]  omeprazole (PRILOSEC OTC) 20 MG tablet Take 1 tablet (20 mg total) by mouth daily. 07/15/18   [name], [name], MD  predniSONE (DELTASONE) 50 MG tablet  Take 1 tablet (50 mg total) by mouth daily. 07/15/18   Lawyer, Cristal Deerhristopher, PA-C  rOPINIRole (REQUIP XL) 4 MG 24 hr tablet Take 4 mg by mouth at bedtime.     [provider]  traZODone (DESYREL) 100 MG tablet Take 200 mg by mouth at bedtime.     [provider]  vortioxetine HBr (TRINTELLIX) 10 MG TABS tablet Take 2 tablets (20 mg total) by mouth daily. Patient taking differently: Take 10 mg by mouth daily.  02/22/18   Charm RingsLord, Jamison Y, NP    Family History Family History  Adopted: Yes     Social History Social History   Tobacco Use  . Smoking status: Never Smoker  . Smokeless tobacco: Never Used  Substance Use Topics  . Alcohol use: Yes    Comment: "wine once or twice a month"  . Drug use: No     Allergies   Sulfa antibiotics   Review of Systems Review of Systems  Constitutional: Negative for chills and fever.  HENT: Negative for ear pain and sore throat.   Eyes: Negative for pain and visual disturbance.  Respiratory: Positive for shortness of breath and wheezing (mild). Negative for cough.   Cardiovascular: Negative for chest pain and palpitations.  Gastrointestinal: Negative for abdominal pain and vomiting.  Genitourinary: Negative for dysuria and hematuria.  Musculoskeletal: Negative for arthralgias and back pain.  Skin: Negative for color change and rash.  Neurological: Negative for seizures and syncope.  All other systems reviewed and are negative.    Physical Exam Updated Vital Signs  ED Triage Vitals [11/11/18 1806]  Enc Vitals Group     BP (!) 133/101     Pulse Rate 76     Resp (!) 22     Temp 98.9 F (37.2 C)     Temp Source Oral     SpO2 97 %     Weight      Height      Head Circumference      Peak Flow      Pain Score 0     Pain Loc      Pain Edu?      Excl. in GC?     Physical Exam Vitals signs and nursing note reviewed.  Constitutional:      General: She is not in acute distress.    Appearance: She is well-developed.  HENT:     Head: Normocephalic and atraumatic.  Eyes:     Conjunctiva/sclera: Conjunctivae normal.     Pupils: Pupils are equal, round, and reactive to light.  Neck:     Musculoskeletal: Normal range of motion and neck supple.  Cardiovascular:     Rate and Rhythm: Normal rate and regular rhythm.     Heart sounds: No murmur.  Pulmonary:     Effort: Tachypnea present. No respiratory distress.     Breath sounds: Decreased breath sounds and wheezing (mild) present. No rhonchi or rales.  Abdominal:      Palpations: Abdomen is soft.     Tenderness: There is no abdominal tenderness.  Musculoskeletal:     Right lower leg: No edema.     Left lower leg: No edema.  Skin:    General: Skin is warm and dry.  Neurological:     Mental Status: She is alert.      ED Treatments / Results  Labs (all labs ordered are listed, but only abnormal results are displayed) Labs Reviewed  CBC WITH DIFFERENTIAL/PLATELET - Abnormal; Notable  for the following components:      Result Value   Hemoglobin 11.9 (*)    RDW 15.9 (*)    All other components within normal limits  BASIC METABOLIC PANEL - Abnormal; Notable for the following components:   Glucose, Bld 107 (*)    BUN 21 (*)    Creatinine, Ser 1.17 (*)    Calcium 8.6 (*)    GFR calc non Af Amer 53 (*)    All other components within normal limits  HEPATIC FUNCTION PANEL - Abnormal; Notable for the following components:   Total Bilirubin 0.2 (*)    Indirect Bilirubin 0.1 (*)    All other components within normal limits  SARS CORONAVIRUS 2 ([facility]OSPITAL [facility])  TROPONIN I (HIGH SENSITIVITY)  BRAIN NATRIURETIC PEPTIDE  TROPONIN I (HIGH SENSITIVITY)    EKG EKG Interpretation  Date/Time:  Thursday November 11 2018 18:02:00 EDT Ventricular Rate:  76 PR Interval:    QRS Duration: 140 QT Interval:  406 QTC Calculation: 457 R Axis:   -69 Text Interpretation:  Sinus rhythm RBBB and LAFB similiar to prior Confirmed by Virgina NorfolkAdam, Renato Spellman [phone]([id number]) on 11/11/2018 6:06:00 PM   Radiology Dg Chest 2 View  Result Date: 11/11/2018 CLINICAL DATA:  Shortness of breath EXAM: CHEST - 2 VIEW COMPARISON:  July 15, 2018 FINDINGS: Heart size is stable. There is no pneumothorax. The thoracic aorta is tortuous. There is atelectasis versus scarring at the lung bases. No large pleural effusion. No focal consolidation. IMPRESSION: No active cardiopulmonary disease. Electronically Signed   By: Katherine Mantlehristopher  Green M.D.   On: 11/11/2018 18:42     Procedures .Critical Care Performed by: Virgina Norfolkuratolo, Korrin Waterfield, DO Authorized by: Virgina Norfolkuratolo, Miley Blanchett, DO   Critical care provider statement:    Critical care time (minutes):  35   Critical care was necessary to treat or prevent imminent or life-threatening deterioration of the following conditions:  Circulatory failure and respiratory failure   Critical care was time spent personally by me on the following activities:  Blood draw for specimens, development of treatment plan with patient or surrogate, discussions with primary provider, evaluation of patient's response to treatment, examination of patient, obtaining history from patient or surrogate, ordering and performing treatments and interventions, ordering and review of laboratory studies, ordering and review of radiographic studies, pulse oximetry, re-evaluation of patient's condition and review of old charts   I assumed direction of critical care for this patient from another provider in my specialty: no     (including critical care time)  Medications Ordered in ED Medications  sodium chloride (PF) 0.9 % injection (has no administration in time range)  iohexol (OMNIPAQUE) 350 MG/ML injection 100 mL (100 mLs Intravenous Contrast Given 11/11/18 2025)     Initial Impression / Assessment and Plan / ED Course  I have reviewed the triage vital signs and the nursing notes.  Pertinent labs & imaging results that were available during my care of the patient were reviewed by me and considered in my medical decision making (see chart for details).     Erika Woods is a 53 year old female with history of diabetes, CKD, history of PE no longer on anticoagulation, history of thoracic aortic aneurysm who presents to the ED with sudden shortness of breath, tachycardia.  EKG shows sinus rhythm with right bundle and left bundle branches that appear on old EKGs.  Denies any chest pain.  Patient is tachypneic on examination.  However clear breath sounds.  No  major  leg swelling.  No history of heart failure.  Denies any infectious symptoms.  Lab work showed normal BNP and troponin.  Creatinine at baseline.  No other significant leukocytosis, anemia, electrolyte abnormality.  Checks x-ray showed no pneumothorax, effusion, pneumonia.  CT PE study however did show multiple right-sided pulmonary embolisms.  Radiology discussed these findings with me on the phone.  There was no right heart strain.  Patient was started on IV heparin for pulmonary embolisms.  She remained tachypneic but hemodynamically stable.  Admitted to medicine for further care.  This chart was dictated using voice recognition software.  Despite best efforts to proofread,  errors can occur which can change the documentation meaning.    Final Clinical Impressions(s) / ED Diagnoses   Final diagnoses:  Acute pulmonary embolism, unspecified pulmonary embolism type, unspecified whether acute cor pulmonale present [facility])    ED Discharge Orders    None       Lennice Sites, DO 11/11/18 2107

## 2018-11-11 NOTE — Progress Notes (Signed)
ANTICOAGULATION CONSULT NOTE - Initial Consult  Pharmacy Consult for Heparin Indication: pulmonary embolus  Allergies  Allergen Reactions  . Sulfa Antibiotics Hives    Patient Measurements:   Heparin Dosing Weight: 93.5kg  Vital Signs: Temp: 98.9 F (37.2 C) (07/02 1806) Temp Source: Oral (07/02 1806) BP: 132/79 (07/02 1930) Pulse Rate: 76 (07/02 1806)  Labs: Recent Labs    11/11/18 1857  HGB 11.9*  HCT 37.4  PLT 228  CREATININE 1.17*  TROPONINIHS 14    CrCl cannot be calculated (Unknown ideal weight.).   Medical History: Past Medical History:  Diagnosis Date  . Diabetes mellitus without complication ([place])   . Impaired fasting glucose   . Obstructive sleep apnea on CPAP   . Osteoarthritis of knee   . Personal history of pulmonary embolism   . Renal disorder    kideny stones  . Restless leg syndrome   . Thoracic aortic aneurysm ([name])     Medications:  Infusions:    Assessment: CT + PE.  CBC- Hg slightly low, pltc WNL.  No bleeding noted. Not on anticoagulants PTA.  Goal of Therapy:  Heparin level 0.3-0.7 units/ml Monitor platelets by anticoagulation protocol: Yes   Plan:  Give 3000 units bolus x 1 Start heparin infusion at 1750 units/hr Check anti-Xa level in 6 hours and daily while on heparin Continue to monitor H&H and platelets  Erika Woods, Lavonia Drafts 11/11/2018,8:59 PM

## 2018-11-11 NOTE — ED Notes (Signed)
Patient ambulated to restroom with minimal assistance. Patient has steady gait and denies SOB or dizziness.

## 2018-11-11 NOTE — ED Notes (Signed)
Pt. Documented in error admit to inpatient.

## 2018-11-11 NOTE — ED Triage Notes (Signed)
Pt c/o SOB and heart racing esp when walking up stairs or around house that started today., reports on her Fitbit her Hr would go from 70 to 140s when walking.

## 2018-11-11 NOTE — H&P (Signed)
History and Physical  Erika Woods:[id number] DOB: January 17, 1966 DOA: 11/11/2018  Referring physician: ER provider PCP: [name], Woods  Outpatient Specialists:    Patient coming from: Home  Chief Complaint: Shortness of breath and palpitations.  HPI:  Patient is a 53 year old Caucasian female, morbidly obese with BMI of 53.25 kg/m, with past medical history significant for pulmonary embolism last year May following right knee arthroscopic surgery, OSA on CPAP, diabetes mellitus, restless leg syndrome and thoracic aortic aneurysm.  Patient developed shortness of breath earlier today, worse on exertion, with associated palpitation.  Patient reported heart rate ranging up to 140 bpm.  No fever or chills, no headache, no neck pain, no chest pain, no GI symptoms and no urinary symptoms.  CT Angio of the chest done is said to reveal right-sided pulmonary embolism (as per the ER provider.  I have not personally visualized the CT angio chest report).  Patient is currently on heparin drip as per the ER provider.  Hospitalist team has been asked to admit patient for further assessment and management.  ED Course: On presentation to the hospital, temperature was 98.9, last documented heart rate was 67 bpm, respiratory rate of 18 and blood pressure 133/83 mmHg.  CT Angio of the chest is reported to reveal a right-sided pulmonary embolism.  Patient is currently on heparin drip.  Vital signs are currently stable.  Placed team has been asked to admit patient for further assessment and management.  Pertinent labs: Chemistry reveals sodium of 141, potassium of 3.8, chloride 190, CO2 25, BUN of 21 with serum creatinine of 1.17 and blood sugar of 107.  Cardiac BNP is 70.1.  Troponin is within normal range.  CBC reveals WBC of 9.6, hemoglobin of 11.9, hematocrit of 37.4, MCV of 90.3 with platelet count of 228.  EKG: Independently reviewed.   Review of Systems:  Negative for fever, visual changes, sore throat,  rash, new muscle aches, chest pain, dysuria, bleeding, n/v/abdominal pain.  Past Medical History:  Diagnosis Date  . Diabetes mellitus without complication ([place])   . Impaired fasting glucose   . Obstructive sleep apnea on CPAP   . Osteoarthritis of knee   . Personal history of pulmonary embolism   . Renal disorder    kideny stones  . Restless leg syndrome   . Thoracic aortic aneurysm [facility])     Past Surgical History:  Procedure Laterality Date  . arm surgery Bilateral Skin removal  . DILATATION & CURETTAGE/HYSTEROSCOPY WITH MYOSURE N/A 06/11/2018   Procedure: DILATATION & CURETTAGE/HYSTEROSCOPY WITH MYOSURE RESECTION OF ENDOMETRIAL THICKENING;  Surgeon: Erika Woods;  Location: [facility];  Service: Gynecology;  Laterality: N/A;  BMI 56  . DILATION AND EVACUATION    . ESOPHAGOGASTRODUODENOSCOPY (EGD) WITH PROPOFOL N/A 07/15/2018   Procedure: ESOPHAGOGASTRODUODENOSCOPY (EGD) WITH PROPOFOL;  Surgeon: Erika Woods;  Location: [facility];  Service: Endoscopy;  Laterality: N/A;  . KIDNEY STONE SURGERY    . KNEE SURGERY Right   . TONSILLECTOMY       reports that she has never smoked. She has never used smokeless tobacco. She reports current alcohol use. She reports that she does not use drugs.  Allergies  Allergen Reactions  . Sulfa Antibiotics Hives    Family History  Adopted: Yes     Prior to Admission medications   Medication Sig Start Date End Date Taking? Authorizing Provider  albuterol (PROVENTIL HFA;VENTOLIN HFA) 108 (90 Base) MCG/ACT inhaler Inhale 1-2 puffs into the lungs every 6 (six)  hours as needed for wheezing or shortness of breath. 02/06/18  Yes Arby BarrettePfeiffer, Erika Woods  cetirizine (ZYRTEC) 10 MG tablet Take 10 mg by mouth daily.   Yes Provider, Historical, Woods  gabapentin (NEURONTIN) 300 MG capsule Take 600 mg by mouth at bedtime. 11/06/18  Yes Provider, Historical, Woods  lamoTRIgine (LAMICTAL) 150 MG tablet Take 150 mg by mouth daily. 06/30/18  Yes Provider,  Historical, Woods  metFORMIN (GLUCOPHAGE) 500 MG tablet Take 500 mg by mouth 2 (two) times daily with a meal.  10/28/13  Yes Provider, Historical, Woods  omeprazole (PRILOSEC OTC) 20 MG tablet Take 1 tablet (20 mg total) by mouth daily. 07/15/18  Yes Erika Woods  rOPINIRole (REQUIP XL) 4 MG 24 hr tablet Take 8 mg by mouth at bedtime.    Yes Provider, Historical, Woods  traZODone (DESYREL) 100 MG tablet Take 300 mg by mouth at bedtime.    Yes Provider, Historical, Woods  TRINTELLIX 20 MG TABS tablet Take 20 mg by mouth daily. 10/27/18  Yes Provider, Historical, Woods    Physical Exam: Vitals:   11/11/18 1900 11/11/18 1930 11/11/18 2100 11/11/18 2102  BP: (!) 109/37 132/79 130/87   Pulse:   70   Resp: (!) 28 (!) 25 19   Temp:      TempSrc:      SpO2: 99% 98% 98%   Weight:    (!) 145.2 kg  Height:    5\' 5"  (1.651 m)     Constitutional:  . Appears calm and comfortable patient is morbidly obese. Eyes:  . No pallor. No jaundice.  ENMT:  . external ears, nose appear normal Neck:  . Neck is supple. No JVD Respiratory:  . CTA bilaterally, no w/r/r.  . Respiratory effort normal. No retractions or accessory muscle use Cardiovascular:  . S1S2 . Fullness of the ankle. Abdomen:  . Abdomen is soft and non tender. Organs are difficult to assess. Neurologic:  . Awake and alert. . Moves all limbs.  Wt Readings from Last 3 Encounters:  11/11/18 (!) 145.2 kg  07/15/18 (!) 145.2 kg  07/15/18 (!) 145.2 kg    I have personally reviewed following labs and imaging studies  Labs on Admission:  CBC: Recent Labs  Lab 11/11/18 1857  WBC 9.6  NEUTROABS 6.6  HGB 11.9*  HCT 37.4  MCV 90.3  PLT 228   Basic Metabolic Panel: Recent Labs  Lab 11/11/18 1857  NA 141  K 3.8  CL 108  CO2 25  GLUCOSE 107*  BUN 21*  CREATININE 1.17*  CALCIUM 8.6*   Liver Function Tests: Recent Labs  Lab 11/11/18 1857  AST 25  ALT 31  ALKPHOS 74  BILITOT 0.2*  PROT 6.6  ALBUMIN 3.9   No results for  input(s): LIPASE, AMYLASE in the last 168 hours. No results for input(s): AMMONIA in the last 168 hours. Coagulation Profile: No results for input(s): INR, PROTIME in the last 168 hours. Cardiac Enzymes: No results for input(s): CKTOTAL, CKMB, CKMBINDEX, TROPONINI in the last 168 hours. BNP (last 3 results) No results for input(s): PROBNP in the last 8760 hours. HbA1C: No results for input(s): HGBA1C in the last 72 hours. CBG: No results for input(s): GLUCAP in the last 168 hours. Lipid Profile: No results for input(s): CHOL, HDL, LDLCALC, TRIG, CHOLHDL, LDLDIRECT in the last 72 hours. Thyroid Function Tests: No results for input(s): TSH, T4TOTAL, FREET4, T3FREE, THYROIDAB in the last 72 hours. Anemia Panel: No results for input(s): VITAMINB12, FOLATE, FERRITIN,  TIBC, IRON, RETICCTPCT in the last 72 hours. Urine analysis:    Component Value Date/Time   COLORURINE STRAW (A) 07/15/2018 1637   APPEARANCEUR CLEAR 07/15/2018 1637   LABSPEC 1.012 07/15/2018 1637   PHURINE 7.0 07/15/2018 1637   GLUCOSEU NEGATIVE 07/15/2018 1637   HGBUR NEGATIVE 07/15/2018 1637   BILIRUBINUR NEGATIVE 07/15/2018 1637   KETONESUR NEGATIVE 07/15/2018 1637   PROTEINUR NEGATIVE 07/15/2018 1637   NITRITE NEGATIVE 07/15/2018 1637   LEUKOCYTESUR NEGATIVE 07/15/2018 1637   Sepsis Labs: @LABRCNTIP (procalcitonin:4,lacticidven:4) )No results found for this or any previous visit (from the past 240 hour(s)).    Radiological Exams on Admission: Dg Chest 2 View  Result Date: 11/11/2018 CLINICAL DATA:  Shortness of breath EXAM: CHEST - 2 VIEW COMPARISON:  July 15, 2018 FINDINGS: Heart size is stable. There is no pneumothorax. The thoracic aorta is tortuous. There is atelectasis versus scarring at the lung bases. No large pleural effusion. No focal consolidation. IMPRESSION: No active cardiopulmonary disease. Electronically Signed   By: Katherine Mantlehristopher  Green M.D.   On: 11/11/2018 18:42    EKG: Independently reviewed.    Active Problems:   Acute pulmonary embolism ([name])   Assessment/Plan Acute pulmonary embolism, recurrent: Admit patient for further assessment and management Continue heparin drip Doppler ultrasound of the lower extremities Echocardiogram Patient will likely be transitioned to oral anticoagulation (patient was on Xarelto for 6 months following pulmonary embolism last May).  OSA on CPAP: Continue CPAP, home settings.  Morbid obesity: Diet and exercise Further management on outpatient basis  Diabetes mellitus: Continue to monitor during the hospital stay Optimize. We will hold metformin for now   DVT prophylaxis: Heparin drip Code Status: Full code Family Communication:  Disposition Plan: Home eventually Consults called: None Admission status: Inpatient  Time spent: 65 minutes  Berton MountSylvester Nashira Mcglynn, Woods  [facility] Pager #: [phone] 7PM-7AM contact night coverage as above  11/11/2018, 9:38 PM

## 2018-11-12 ENCOUNTER — Inpatient Hospital Stay (HOSPITAL_BASED_OUTPATIENT_CLINIC_OR_DEPARTMENT_OTHER): Payer: BC Managed Care – PPO

## 2018-11-12 DIAGNOSIS — I2699 Other pulmonary embolism without acute cor pulmonale: Secondary | ICD-10-CM

## 2018-11-12 LAB — BASIC METABOLIC PANEL
Anion gap: 8 (ref 5–15)
BUN: 18 mg/dL (ref 6–20)
CO2: 26 mmol/L (ref 22–32)
Calcium: 8.8 mg/dL — ABNORMAL LOW (ref 8.9–10.3)
Chloride: 108 mmol/L (ref 98–111)
Creatinine, Ser: 1.13 mg/dL — ABNORMAL HIGH (ref 0.44–1.00)
GFR calc Af Amer: >60 mL/min (ref >60)
GFR calc non Af Amer: 55 mL/min — ABNORMAL LOW (ref >60)
Glucose, Bld: 142 mg/dL — ABNORMAL HIGH (ref 70–99)
Potassium: 3.5 mmol/L (ref 3.5–5.1)
Sodium: 142 mmol/L (ref 135–145)

## 2018-11-12 LAB — ECHOCARDIOGRAM COMPLETE
Height: 65 in
Weight: 5120 oz

## 2018-11-12 LAB — CBC
HCT: 37.9 % (ref 36.0–46.0)
Hemoglobin: 11.5 g/dL — ABNORMAL LOW (ref 12.0–15.0)
MCH: 27.9 pg (ref 26.0–34.0)
MCHC: 30.3 g/dL (ref 30.0–36.0)
MCV: 92 fL (ref 80.0–100.0)
Platelets: 216 10*3/uL (ref 150–400)
RBC: 4.12 MIL/uL (ref 3.87–5.11)
RDW: 15.8 % — ABNORMAL HIGH (ref 11.5–15.5)
WBC: 7.8 10*3/uL (ref 4.0–10.5)
nRBC: 0 % (ref 0.0–0.2)

## 2018-11-12 LAB — ANTITHROMBIN III: AntiThromb III Func: 93 % (ref 75–120)

## 2018-11-12 LAB — HEMOGLOBIN A1C
Hgb A1c MFr Bld: 6.1 % — ABNORMAL HIGH (ref 4.8–5.6)
Mean Plasma Glucose: 128.37 mg/dL

## 2018-11-12 LAB — HIV ANTIBODY (ROUTINE TESTING W REFLEX): HIV Screen 4th Generation wRfx: NONREACTIVE

## 2018-11-12 LAB — CBG MONITORING, ED: Glucose-Capillary: 112 mg/dL — ABNORMAL HIGH (ref 70–99)

## 2018-11-12 LAB — HEPARIN LEVEL (UNFRACTIONATED): Heparin Unfractionated: 0.74 IU/mL — ABNORMAL HIGH (ref 0.30–0.70)

## 2018-11-12 MED ORDER — TRAMADOL HCL 50 MG PO TABS: 50.0000 mg | ORAL_TABLET | Freq: Three times a day (TID) | ORAL | 0 refills | Status: AC | PRN

## 2018-11-12 MED ORDER — MORPHINE SULFATE (PF) 4 MG/ML IV SOLN
3.0000 mg | Freq: Once | INTRAVENOUS | Status: AC
  Administered 2018-11-12: 3 mg via INTRAVENOUS
  Filled 2018-11-12: qty 1

## 2018-11-12 MED ORDER — ATORVASTATIN CALCIUM 20 MG PO TABS: 20.0000 mg | ORAL_TABLET | Freq: Every day | ORAL | Status: DC

## 2018-11-12 MED ORDER — RIVAROXABAN (XARELTO) VTE STARTER PACK (15 & 20 MG): ORAL_TABLET | ORAL | 0 refills | Status: DC

## 2018-11-12 MED ORDER — RIVAROXABAN (XARELTO) EDUCATION KIT FOR DVT/PE PATIENTS
PACK | Freq: Once | Status: AC
  Administered 2018-11-12: 11:00:00
  Filled 2018-11-12: qty 1

## 2018-11-12 MED ORDER — RIVAROXABAN 15 MG PO TABS
15.0000 mg | ORAL_TABLET | Freq: Two times a day (BID) | ORAL | Status: DC
  Administered 2018-11-12: 15 mg via ORAL
  Filled 2018-11-12: qty 1

## 2018-11-12 NOTE — Progress Notes (Addendum)
Lower extremity venous has been completed.   Preliminary results in CV Proc.   Results given to [facility], [place].   Abram Sander 11/12/2018 8:45 AM

## 2018-11-12 NOTE — ED Notes (Signed)
Introduced self to pt and provided breakfast tray

## 2018-11-12 NOTE — Discharge Summary (Signed)
Physician Discharge Summary  Erika Woods:[id number] DOB: Mar 25, 1966 DOA: 11/11/2018  PCP: Dois Davenport, MD  Admit date: 11/11/2018 Discharge date: 11/12/2018  Admitted From: Home Disposition: Home  Recommendations for Outpatient Follow-up:  1. Follow up with PCP in 1-2 weeks 2. Please obtain CBC/BMP/Mag at follow up 3. Please follow up on the following pending results: Hypercoagulable labs  Home Health: None Equipment/Devices: None  Discharge Condition: Stable CODE STATUS: Full code  Hospital Course: 53 year old female with morbid obesity, history of provoked PE, OSA on CPAP, DM-2, restless leg syndrome, thoracic aortic aneurysm and right knee arthroscopic surgery presenting with dyspnea and palpitation and found to have right-sided pulmonary embolism affecting all lobes in the right lung but no central or saddle embolus or pulmonary infarct.  Patient was started on heparin and transitioned to Xarelto.  She was not hypoxemic.  Hemodynamically stable.  Ambulated on room air prior to discharge and maintained oxygen saturation greater than 91%.  Echocardiogram obtained and did not reveal heart strain.  See detailed read below. Hypercoagulable lab orders prior to discharge and results are pending.  Discharge Diagnoses:  Acute pulmonary embolism, recurrent: no signs of right heart strain on CT or echocardiogram.  BNP and troponin was normal.  Hemodynamically stable. -Discharged on starter pack Xarelto. -Hypercoagulable labs pending at time of discharge.  Other comorbidities including OSA, thoracic aortic aneurysm, morbid obesity and diabetes stable.  Discharge Instructions  Discharge Instructions    Call MD for:  difficulty breathing, headache or visual disturbances   Complete by: As directed    Call MD for:  extreme fatigue   Complete by: As directed    Call MD for:  persistant dizziness or light-headedness   Complete by: As directed    Diet - low sodium heart healthy    Complete by: As directed    Diet Carb Modified   Complete by: As directed    Discharge instructions   Complete by: As directed    It has been a pleasure taking care of you! You were admitted with pulmonary embolism (blood clot in your lung).  You were discharged on Xarelto.  Please follow the directions on the medication before you take them.  Please avoid any over-the-counter pain medications other than Tylenol while taking Xarelto. Follow-up with your primary care doctor in 1 to 2 weeks.   Take care, Dr. Alanda Slim   Increase activity slowly   Complete by: As directed      Allergies as of 11/12/2018      Reactions   Sulfa Antibiotics Hives      Medication List    TAKE these medications   albuterol 108 (90 Base) MCG/ACT inhaler Commonly known as: VENTOLIN HFA Inhale 1-2 puffs into the lungs every 6 (six) hours as needed for wheezing or shortness of breath.   cetirizine 10 MG tablet Commonly known as: ZYRTEC Take 10 mg by mouth daily.   gabapentin 300 MG capsule Commonly known as: NEURONTIN Take 600 mg by mouth at bedtime.   lamoTRIgine 150 MG tablet Commonly known as: LAMICTAL Take 150 mg by mouth daily.   metFORMIN 500 MG tablet Commonly known as: GLUCOPHAGE Take 500 mg by mouth 2 (two) times daily with a meal.   omeprazole 20 MG tablet Commonly known as: PriLOSEC OTC Take 1 tablet (20 mg total) by mouth daily.   Rivaroxaban 15 & 20 MG Tbpk Take as directed on package: Start with one  tablet by mouth twice a day with food.  On Day 22, switch to one 20mg  tablet once a day with food.   rOPINIRole 4 MG 24 hr tablet Commonly known as: REQUIP XL Take 8 mg by mouth at bedtime.   traZODone 100 MG tablet Commonly known as: DESYREL Take 300 mg by mouth at bedtime.   Trintellix 20 MG Tabs tablet Generic drug: vortioxetine HBr Take 20 mg by mouth daily.      Follow-up Information    [name], MD. Schedule an appointment as soon as possible for a visit in  1 week(s).   Specialty: Family Medicine Contact information: [address] [place] [address] [phone]           Consultations:  None  Procedures/Studies:  2D Echo: 1. The left ventricle has normal systolic function, with an ejection fraction of 55-60%. The cavity size was normal. Left ventricular diastolic Doppler parameters are consistent with impaired relaxation. No evidence of left ventricular regional wall  motion abnormalities.  2. The right ventricle has normal systolic function. The cavity was normal. There is no increase in right ventricular wall thickness.  3. The mitral valve is grossly normal.  4. The aortic valve is tricuspid. Mild sclerosis of the aortic valve. Aortic valve regurgitation is trivial by color flow Doppler. No stenosis of the aortic valve.  5. There is mild dilatation of the ascending aorta measuring 42 mm.  Dg Chest 2 View  Result Date: 11/11/2018 CLINICAL DATA:  Shortness of breath EXAM: CHEST - 2 VIEW COMPARISON:  July 15, 2018 FINDINGS: Heart size is stable. There is no pneumothorax. The thoracic aorta is tortuous. There is atelectasis versus scarring at the lung bases. No large pleural effusion. No focal consolidation. IMPRESSION: No active cardiopulmonary disease. Electronically Signed   By: Constance Holster M.D.   On: 11/11/2018 18:42   Ct Angio Chest Pe W And/or Wo Contrast  Result Date: 11/11/2018 CLINICAL DATA:  53 year old female with shortness of breath and tachycardia. EXAM: CT ANGIOGRAPHY CHEST WITH CONTRAST TECHNIQUE: Multidetector CT imaging of the chest was performed using the standard protocol during bolus administration of intravenous contrast. Multiplanar CT image reconstructions and MIPs were obtained to evaluate the vascular anatomy. CONTRAST:  142mL OMNIPAQUE IOHEXOL 350 MG/ML SOLN COMPARISON:  Chest radiographs earlier today. CTA chest 07/15/2018 and earlier. FINDINGS: Cardiovascular: Good contrast  bolus timing in the pulmonary arterial tree. Mild respiratory motion. No pulmonary artery filling defect identified on the left. However, there is right middle lobe and right lower lobe pulmonary artery thrombus (series 5, images 148 and 162). No central or saddle embolus. There is also right upper lobe thrombus on series 5, image 91. Borderline to mild central pulmonary artery enlargement appears stable along with mild cardiomegaly. No pericardial effusion. Negative visible aorta. Mediastinum/Nodes: Negative, no mediastinal lymphadenopathy. Lungs/Pleura: Major airways are patent. Similar lung volumes. No pleural effusion or abnormal pulmonary opacity aside from mild mosaic attenuation probably due to gas trapping in both upper lobes. Upper Abdomen: Hepatic steatosis redemonstrated. Negative visible spleen, pancreas, adrenal glands and left renal upper pole. Small gastric hiatal hernia redemonstrated. Musculoskeletal: No acute osseous abnormality identified. Review of the MIP images confirms the above findings. IMPRESSION: 1. Positive for acute pulmonary embolus affecting all lobes in the right lung. No central or saddle embolus. 2. No pleural effusion or pulmonary infarct. Mild chronic cardiomegaly. 3. Hepatic steatosis. Small hiatal hernia. Critical Value/emergent results were called by telephone at the time of interpretation on 11/11/2018 at 8:55 pm to Dr. Quita Skye  CURATOLO , who verbally acknowledged these results. Electronically Signed   By: Odessa FlemingH  Hall M.D.   On: 11/11/2018 20:57   Vas [place] Lower Extremity Venous (dvt)  Result Date: 11/12/2018  Lower Venous Study Indications: Pulmonary embolism.  Limitations: Poor ultrasound/tissue interface and body habitus. Comparison Study: Previous study done 09/15/17 Performing Technologist: Blanch MediaMegan Riddle RVS  Examination Guidelines: A complete evaluation includes B-mode imaging, spectral Doppler, color Doppler, and power Doppler as needed of all accessible portions of each vessel.  Bilateral testing is considered an integral part of a complete examination. Limited examinations for reoccurring indications may be performed as noted.  +---------+---------------+---------+-----------+----------+--------------+ RIGHT    CompressibilityPhasicitySpontaneityPropertiesSummary        +---------+---------------+---------+-----------+----------+--------------+ CFV      Full           Yes      Yes                                 +---------+---------------+---------+-----------+----------+--------------+ SFJ      Full                                                        +---------+---------------+---------+-----------+----------+--------------+ FV Prox  Full                                                        +---------+---------------+---------+-----------+----------+--------------+ FV Mid   Full                                                        +---------+---------------+---------+-----------+----------+--------------+ FV DistalFull                                                        +---------+---------------+---------+-----------+----------+--------------+ PFV      Full                                                        +---------+---------------+---------+-----------+----------+--------------+ POP      Full           Yes      Yes                                 +---------+---------------+---------+-----------+----------+--------------+ PTV      Full                                                        +---------+---------------+---------+-----------+----------+--------------+  PERO                                                  Not visualized +---------+---------------+---------+-----------+----------+--------------+   +---------+---------------+---------+-----------+----------+--------------+ LEFT     CompressibilityPhasicitySpontaneityPropertiesSummary         +---------+---------------+---------+-----------+----------+--------------+ CFV      Full           Yes      Yes                                 +---------+---------------+---------+-----------+----------+--------------+ SFJ      Full                                                        +---------+---------------+---------+-----------+----------+--------------+ FV Prox  Full                                                        +---------+---------------+---------+-----------+----------+--------------+ FV Mid   Full                                                        +---------+---------------+---------+-----------+----------+--------------+ FV DistalFull                                                        +---------+---------------+---------+-----------+----------+--------------+ PFV      Full                                                        +---------+---------------+---------+-----------+----------+--------------+ POP      Full           Yes      Yes                                 +---------+---------------+---------+-----------+----------+--------------+ PTV      Full                                                        +---------+---------------+---------+-----------+----------+--------------+ PERO  Not visualized +---------+---------------+---------+-----------+----------+--------------+     Summary: Right: There is no evidence of deep vein thrombosis in the lower extremity. No cystic structure found in the popliteal fossa. Left: There is no evidence of deep vein thrombosis in the lower extremity. A cystic structure is found in the popliteal fossa.  *See table(s) above for measurements and observations.    Preliminary       Subjective: No major events overnight of this morning.  Ambulated to bathroom without any issues.  Saturating in 90s on room air.  She denies dyspnea, chest  pain or palpitation.  No GI or GU symptoms.  Denies history of cancer, hormonal therapy use or smoking cigarettes.  Had PE after knee surgery last year.  She was on Xarelto for 6 months and stopped.   Discharge Exam: Vitals:   11/12/18 0925 11/12/18 0930  BP: 128/81 (!) 123/101  Pulse: 64   Resp: 18 19  Temp:    SpO2: 96% 97%    GENERAL: No acute distress.  Appears well.  HEENT: MMM.  Vision and hearing grossly intact.  NECK: Supple.  No JVD.  LUNGS:  No IWOB. Good air movement bilaterally. HEART:  RRR. Heart sounds normal.  ABD: Bowel sounds present. Soft. Non tender.  MSK/EXT:  Moves all extremities. No apparent deformity. No edema bilaterally. SKIN: no apparent skin lesion or wound NEURO: Awake, alert and oriented appropriately.  No gross deficit.  PSYCH: Calm. Normal affect.   The results of significant diagnostics from this hospitalization (including imaging, microbiology, ancillary and laboratory) are listed below for reference.     Microbiology: Recent Results (from the past 240 hour(s))  SARS Coronavirus 2 (CEPHEID- Performed [facility]), Hosp Order     Status: None   Collection Time: 11/11/18  9:02 PM   Specimen: Nasopharyngeal Swab  Result Value Ref Range Status   SARS Coronavirus 2 NEGATIVE NEGATIVE Final    Comment: (NOTE) If result is NEGATIVE SARS-CoV-2 target nucleic acids are NOT DETECTED. The SARS-CoV-2 RNA is generally detectable in upper and lower  respiratory specimens during the acute phase of infection. The lowest  concentration of SARS-CoV-2 viral copies this assay can detect is 250  copies / mL. A negative result does not preclude SARS-CoV-2 infection  and should not be used as the sole basis for treatment or other  patient management decisions.  A negative result may occur with  improper specimen collection / handling, submission of specimen other  than nasopharyngeal swab, presence of viral mutation(s) within the  areas targeted  by this assay, and inadequate number of viral copies  (<250 copies / mL). A negative result must be combined with clinical  observations, patient history, and epidemiological information. If result is POSITIVE SARS-CoV-2 target nucleic acids are DETECTED. The SARS-CoV-2 RNA is generally detectable in upper and lower  respiratory specimens dur ing the acute phase of infection.  Positive  results are indicative of active infection with SARS-CoV-2.  Clinical  correlation with patient history and other diagnostic information is  necessary to determine patient infection status.  Positive results do  not rule out bacterial infection or co-infection with other viruses. If result is PRESUMPTIVE POSTIVE SARS-CoV-2 nucleic acids MAY BE PRESENT.   A presumptive positive result was obtained on the submitted specimen  and confirmed on repeat testing.  While 2019 novel coronavirus  (SARS-CoV-2) nucleic acids may be present in the submitted sample  additional confirmatory testing may be necessary for epidemiological  and / or  clinical management purposes  to differentiate between  SARS-CoV-2 and other Sarbecovirus currently known to infect humans.  If clinically indicated additional testing with an alternate test  methodology [phone]) is advised. The SARS-CoV-2 RNA is generally  detectable in upper and lower respiratory sp ecimens during the acute  phase of infection. The expected result is Negative. Fact Sheet for Patients:  BoilerBrush.com.cy Fact Sheet for Healthcare Providers: https://pope.com/ This test is not yet approved or cleared by the [place] FDA and has been authorized for detection and/or diagnosis of SARS-CoV-2 by FDA under an Emergency Use Authorization (EUA).  This EUA will remain in effect (meaning this test can be used) for the duration of the COVID-19 declaration under Section 564(b)(1) of the Act, 21 U.S.C. section  360bbb-3(b)(1), unless the authorization is terminated or revoked sooner. Performed at [facility], [address], [place], [place] [address]      Labs: BNP (last 3 results) Recent Labs    11/11/18 1858  BNP 70.1   Basic Metabolic Panel: Recent Labs  Lab 11/11/18 1857 11/11/18 2149 11/12/18 0704  NA 141  --  142  K 3.8  --  3.5  CL 108  --  108  CO2 25  --  26  GLUCOSE 107*  --  142*  BUN 21*  --  18  CREATININE 1.17*  --  1.13*  CALCIUM 8.6*  --  8.8*  MG  --  2.0  --   PHOS  --  3.0  --    Liver Function Tests: Recent Labs  Lab 11/11/18 1857  AST 25  ALT 31  ALKPHOS 74  BILITOT 0.2*  PROT 6.6  ALBUMIN 3.9   No results for input(s): LIPASE, AMYLASE in the last 168 hours. No results for input(s): AMMONIA in the last 168 hours. CBC: Recent Labs  Lab 11/11/18 1857 11/12/18 0704  WBC 9.6 7.8  NEUTROABS 6.6  --   HGB 11.9* 11.5*  HCT 37.4 37.9  MCV 90.3 92.0  PLT 228 216   Cardiac Enzymes: No results for input(s): CKTOTAL, CKMB, CKMBINDEX, TROPONINI in the last 168 hours. BNP: Invalid input(s): POCBNP CBG: Recent Labs  Lab 11/11/18 2203 11/12/18 0746  GLUCAP 110* 112*   D-Dimer No results for input(s): DDIMER in the last 72 hours. Hgb A1c Recent Labs    11/12/18 0704  HGBA1C 6.1*   Lipid Profile No results for input(s): CHOL, HDL, LDLCALC, TRIG, CHOLHDL, LDLDIRECT in the last 72 hours. Thyroid function studies No results for input(s): TSH, T4TOTAL, T3FREE, THYROIDAB in the last 72 hours.  Invalid input(s): FREET3 Anemia work up No results for input(s): VITAMINB12, FOLATE, FERRITIN, TIBC, IRON, RETICCTPCT in the last 72 hours. Urinalysis    Component Value Date/Time   COLORURINE STRAW (A) 07/15/2018 1637   APPEARANCEUR CLEAR 07/15/2018 1637   LABSPEC 1.012 07/15/2018 1637   PHURINE 7.0 07/15/2018 1637   GLUCOSEU NEGATIVE 07/15/2018 1637   HGBUR NEGATIVE 07/15/2018 1637   BILIRUBINUR NEGATIVE 07/15/2018 1637    KETONESUR NEGATIVE 07/15/2018 1637   PROTEINUR NEGATIVE 07/15/2018 1637   NITRITE NEGATIVE 07/15/2018 1637   LEUKOCYTESUR NEGATIVE 07/15/2018 1637   Sepsis Labs Invalid input(s): PROCALCITONIN,  WBC,  LACTICIDVEN   Time coordinating discharge: 35 minutes  SIGNED:  Almon Hercules, MD  [facility] 11/12/2018, 10:04 AM  If 7PM-7AM, please contact night-coverage [email or web address] Password [email or web address]

## 2018-11-12 NOTE — Discharge Instructions (Signed)
Information on my medicine - XARELTO (rivaroxaban)  This medication education was reviewed with me or my healthcare representative as part of my discharge preparation.  The pharmacist that spoke with me during my hospital stay was:  [place] PRESCRIBED FOR YOU? Xarelto was prescribed to treat blood clots that may have been found in the veins of your legs (deep vein thrombosis) or in your lungs (pulmonary embolism) and to reduce the risk of them occurring again.  What do you need to know about Xarelto? The starting dose is one 15 mg tablet taken TWICE daily with food for the FIRST 21 DAYS then on (enter date)  12/03/18  the dose is changed to one 20 mg tablet taken ONCE A DAY with your evening meal.  DO NOT stop taking Xarelto without talking to the health care provider who prescribed the medication.  Refill your prescription for 20 mg tablets before you run out.  After discharge, you should have regular check-up appointments with your healthcare provider that is prescribing your Xarelto.  In the future your dose may need to be changed if your kidney function changes by a significant amount.  What do you do if you miss a dose? If you are taking Xarelto TWICE DAILY and you miss a dose, take it as soon as you remember. You may take two 15 mg tablets (total 30 mg) at the same time then resume your regularly scheduled 15 mg twice daily the next day.  If you are taking Xarelto ONCE DAILY and you miss a dose, take it as soon as you remember on the same day then continue your regularly scheduled once daily regimen the next day. Do not take two doses of Xarelto at the same time.   Important Safety Information Xarelto is a blood thinner medicine that can cause bleeding. You should call your healthcare provider right away if you experience any of the following: ? Bleeding from an injury or your nose that does not stop. ? Unusual colored urine (red or dark brown) or unusual colored  stools (red or black). ? Unusual bruising for unknown reasons. ? A serious fall or if you hit your head (even if there is no bleeding).  Some medicines may interact with Xarelto and might increase your risk of bleeding while on Xarelto. To help avoid this, consult your healthcare provider or pharmacist prior to using any new prescription or non-prescription medications, including herbals, vitamins, non-steroidal anti-inflammatory drugs (NSAIDs) and supplements.  This website has more information on Xarelto: https://guerra-benson.com/.

## 2018-11-12 NOTE — ED Notes (Signed)
Patient states that her SOB has improved after breathing treatment. Patient able to ambulate to restroom without assistance following treatment. Will continue to monitor.

## 2018-11-12 NOTE — ED Notes (Addendum)
Pt. Documented in error CT Angio Chest PE W and or w/o Contrast.

## 2018-11-12 NOTE — Progress Notes (Signed)
  Echocardiogram 2D Echocardiogram has been performed.  Darlina Sicilian M 11/12/2018, 9:22 AM

## 2018-11-12 NOTE — ED Notes (Addendum)
Walked into pt's room with discharge paperwork to find pt in tears. Pt is c/o left lower back pain in her flank area. Pt states it does not feel like kidney stones she has had in the past. Pain is worse with palpation. MD made aware.

## 2018-11-12 NOTE — ED Notes (Signed)
Patient complaining of shortness of breath on rest and has tachypnea on assessment. SpO2 and respiratory assessment WDL except for SOB and tachypnea. HOB elevated and patient assisted into a sitting position. Will continue to monitor patient.

## 2018-11-12 NOTE — ED Notes (Signed)
Pt ambulated on pulse ox. HR increased to a maximum of 107bpm. SpO2 decreased to a minimum of 91%.

## 2018-11-12 NOTE — Progress Notes (Signed)
ANTICOAGULATION CONSULT NOTE - Initial Consult  Pharmacy Consult for Heparin Indication: pulmonary embolus  Allergies  Allergen Reactions  . Sulfa Antibiotics Hives    Patient Measurements: Height: 5\' 5"  (165.1 cm) Weight: (!) 320 lb (145.2 kg) IBW/kg (Calculated) : 57 Heparin Dosing Weight: 93 kg  Vital Signs: BP: 138/81 (07/03 1000) Pulse Rate: 65 (07/03 1000)  Labs: Recent Labs    11/11/18 1857 11/11/18 2102 11/12/18 0704 11/12/18 0930  HGB 11.9*  --  11.5*  --   HCT 37.4  --  37.9  --   PLT 228  --  216  --   HEPARINUNFRC  --   --   --  0.74*  CREATININE 1.17*  --  1.13*  --   TROPONINIHS 14 13.0  --   --     Estimated Creatinine Clearance: 82.9 mL/min (A) (by C-G formula based on SCr of 1.13 mg/dL (H)).   Medical History: Past Medical History:  Diagnosis Date  . Diabetes mellitus without complication ([place])   . Impaired fasting glucose   . Obstructive sleep apnea on CPAP   . Osteoarthritis of knee   . Personal history of pulmonary embolism   . Renal disorder    kideny stones  . Restless leg syndrome   . Thoracic aortic aneurysm ([name])     Medications:  Infusions:  . heparin 1,750 Units/hr (11/12/18 0703)    Assessment: Patient was started on heparin drip for newly diagnosed PE. Pt not currently on anticoagulation PTA, but has been on rivaroxaban in the past. Pharmacy consulted to transition from heparin to rivaroxaban for discharge.  Today, 11/12/18  Hgb 11.5 - slightly low but stable  Plt 216 - WNL  HL = 0.74 just resulted is slightly supratherapeutic on heparin 1750 units/hr.  Goal of Therapy:  Heparin level 0.3-0.7 units/ml Monitor platelets by anticoagulation protocol: Yes   Plan:   Discontinue heparin drip at time of rivaroxaban initiation  Rivaroxaban 15 mg PO BID with meals x 21 days followed by 20 mg PO daily with supper  Patient given education booklet and counseled on medication  Pt does not qualify for free 30-day coupon as  she has already used it in the past  Lenis Noon, PharmD 11/12/2018,10:28 AM

## 2018-11-13 LAB — HOMOCYSTEINE: Homocysteine: 14.4 umol/L (ref 0.0–14.5)

## 2018-11-15 LAB — PROTEIN S, TOTAL: Protein S Ag, Total: 96 % (ref 60–150)

## 2018-11-15 LAB — PROTEIN C, TOTAL: Protein C, Total: 142 % (ref 60–150)

## 2018-11-15 LAB — PROTEIN C ACTIVITY: Protein C Activity: 159 % (ref 73–180)

## 2018-11-15 LAB — PROTEIN S ACTIVITY: Protein S Activity: 101 % (ref 63–140)

## 2018-11-16 LAB — BETA-2-GLYCOPROTEIN I ABS, IGG/M/A
Beta-2 Glyco I IgG: <9 GPI IgG units (ref 0–20)
Beta-2-Glycoprotein I IgA: <9 GPI IgA units (ref 0–25)
Beta-2-Glycoprotein I IgM: <9 GPI IgM units (ref 0–32)

## 2018-11-16 LAB — CARDIOLIPIN ANTIBODIES, IGG, IGM, IGA
Anticardiolipin IgA: <9 APL U/mL (ref 0–11)
Anticardiolipin IgG: 9 GPL U/mL (ref 0–14)
Anticardiolipin IgM: <9 MPL U/mL (ref 0–12)

## 2018-11-16 LAB — LUPUS ANTICOAGULANT PANEL
DRVVT: 53.4 s — ABNORMAL HIGH (ref 0.0–47.0)
PTT Lupus Anticoagulant: 34 s (ref 0.0–51.9)

## 2018-11-16 LAB — DRVVT MIX: dRVVT Mix: 43 s (ref 0.0–47.0)

## 2018-11-18 LAB — FACTOR 5 LEIDEN

## 2018-11-24 LAB — PROTHROMBIN GENE MUTATION

## 2019-03-17 ENCOUNTER — Telehealth: Payer: Self-pay | Admitting: *Deleted

## 2019-03-17 ENCOUNTER — Other Ambulatory Visit: Payer: Self-pay | Admitting: *Deleted

## 2019-03-17 DIAGNOSIS — I712 Thoracic aortic aneurysm, without rupture, unspecified: Secondary | ICD-10-CM

## 2019-03-17 DIAGNOSIS — Z01812 Encounter for preprocedural laboratory examination: Secondary | ICD-10-CM

## 2019-03-17 DIAGNOSIS — E1169 Type 2 diabetes mellitus with other specified complication: Secondary | ICD-10-CM

## 2019-03-17 NOTE — Telephone Encounter (Signed)
Order for BMET placed for December, as requested per CT.  CT scheduler to arrange BMET appt.

## 2019-04-09 ENCOUNTER — Other Ambulatory Visit (HOSPITAL_COMMUNITY)
Admission: RE | Admit: 2019-04-09 | Discharge: 2019-04-09 | Disposition: A | Discharge status: Home / Self Care | Payer: BC Managed Care – PPO | Source: Ambulatory Visit | Attending: Gastroenterology | Admitting: Gastroenterology

## 2019-04-09 DIAGNOSIS — Z20828 Contact with and (suspected) exposure to other viral communicable diseases: Secondary | ICD-10-CM | POA: Insufficient documentation

## 2019-04-09 DIAGNOSIS — Z01812 Encounter for preprocedural laboratory examination: Secondary | ICD-10-CM | POA: Diagnosis present

## 2019-04-10 LAB — NOVEL CORONAVIRUS, NAA (HOSP ORDER, SEND-OUT TO REF LAB; TAT 18-24 HRS): SARS-CoV-2, NAA: NOT DETECTED

## 2019-04-11 ENCOUNTER — Other Ambulatory Visit: Payer: Self-pay | Admitting: Gastroenterology

## 2019-04-21 ENCOUNTER — Other Ambulatory Visit: Payer: BC Managed Care – PPO

## 2019-04-23 ENCOUNTER — Other Ambulatory Visit (HOSPITAL_COMMUNITY)
Admission: RE | Admit: 2019-04-23 | Discharge: 2019-04-23 | Disposition: A | Discharge status: Home / Self Care | Payer: BC Managed Care – PPO | Source: Ambulatory Visit | Attending: Gastroenterology | Admitting: Gastroenterology

## 2019-04-23 DIAGNOSIS — Z20828 Contact with and (suspected) exposure to other viral communicable diseases: Secondary | ICD-10-CM | POA: Insufficient documentation

## 2019-04-23 DIAGNOSIS — Z01812 Encounter for preprocedural laboratory examination: Secondary | ICD-10-CM | POA: Diagnosis present

## 2019-04-24 LAB — NOVEL CORONAVIRUS, NAA (HOSP ORDER, SEND-OUT TO REF LAB; TAT 18-24 HRS): SARS-CoV-2, NAA: NOT DETECTED

## 2019-04-25 ENCOUNTER — Other Ambulatory Visit: Payer: BC Managed Care – PPO | Admitting: *Deleted

## 2019-04-25 ENCOUNTER — Other Ambulatory Visit: Payer: Self-pay

## 2019-04-25 DIAGNOSIS — E1169 Type 2 diabetes mellitus with other specified complication: Secondary | ICD-10-CM

## 2019-04-25 DIAGNOSIS — Z01812 Encounter for preprocedural laboratory examination: Secondary | ICD-10-CM

## 2019-04-26 LAB — BASIC METABOLIC PANEL
BUN/Creatinine Ratio: 13 (ref 9–23)
BUN: 15 mg/dL (ref 6–24)
CO2: 29 mmol/L (ref 20–29)
Calcium: 9.6 mg/dL (ref 8.7–10.2)
Chloride: 102 mmol/L (ref 96–106)
Creatinine, Ser: 1.17 mg/dL — ABNORMAL HIGH (ref 0.57–1.00)
GFR calc Af Amer: 61 mL/min/{1.73_m2} (ref >59)
GFR calc non Af Amer: 53 mL/min/{1.73_m2} — ABNORMAL LOW (ref >59)
Glucose: 133 mg/dL — ABNORMAL HIGH (ref 65–99)
Potassium: 4.6 mmol/L (ref 3.5–5.2)
Sodium: 143 mmol/L (ref 134–144)

## 2019-04-27 ENCOUNTER — Ambulatory Visit (HOSPITAL_COMMUNITY): Payer: BC Managed Care – PPO | Admitting: Anesthesiology

## 2019-04-27 ENCOUNTER — Encounter (HOSPITAL_COMMUNITY): Payer: Self-pay | Admitting: Gastroenterology

## 2019-04-27 ENCOUNTER — Ambulatory Visit (HOSPITAL_COMMUNITY)
Admission: RE | Admit: 2019-04-27 | Discharge: 2019-04-27 | Disposition: A | Discharge status: Home / Self Care | Payer: BC Managed Care – PPO | Attending: Gastroenterology | Admitting: Gastroenterology

## 2019-04-27 ENCOUNTER — Encounter (HOSPITAL_COMMUNITY)
Admission: RE | Disposition: A | Discharge status: Home / Self Care | Payer: Self-pay | Source: Home / Self Care | Attending: Gastroenterology

## 2019-04-27 ENCOUNTER — Other Ambulatory Visit: Payer: Self-pay

## 2019-04-27 DIAGNOSIS — F4310 Post-traumatic stress disorder, unspecified: Secondary | ICD-10-CM | POA: Insufficient documentation

## 2019-04-27 DIAGNOSIS — G4730 Sleep apnea, unspecified: Secondary | ICD-10-CM | POA: Insufficient documentation

## 2019-04-27 DIAGNOSIS — G2581 Restless legs syndrome: Secondary | ICD-10-CM | POA: Insufficient documentation

## 2019-04-27 DIAGNOSIS — K7460 Unspecified cirrhosis of liver: Secondary | ICD-10-CM | POA: Diagnosis not present

## 2019-04-27 DIAGNOSIS — Z6841 Body Mass Index (BMI) 40.0 and over, adult: Secondary | ICD-10-CM | POA: Insufficient documentation

## 2019-04-27 DIAGNOSIS — F419 Anxiety disorder, unspecified: Secondary | ICD-10-CM | POA: Diagnosis not present

## 2019-04-27 DIAGNOSIS — Z882 Allergy status to sulfonamides status: Secondary | ICD-10-CM | POA: Insufficient documentation

## 2019-04-27 DIAGNOSIS — K2100 Gastro-esophageal reflux disease with esophagitis, without bleeding: Secondary | ICD-10-CM | POA: Diagnosis present

## 2019-04-27 DIAGNOSIS — K449 Diaphragmatic hernia without obstruction or gangrene: Secondary | ICD-10-CM | POA: Insufficient documentation

## 2019-04-27 DIAGNOSIS — M1990 Unspecified osteoarthritis, unspecified site: Secondary | ICD-10-CM | POA: Insufficient documentation

## 2019-04-27 DIAGNOSIS — Z87442 Personal history of urinary calculi: Secondary | ICD-10-CM | POA: Insufficient documentation

## 2019-04-27 DIAGNOSIS — I712 Thoracic aortic aneurysm, without rupture: Secondary | ICD-10-CM | POA: Diagnosis not present

## 2019-04-27 DIAGNOSIS — F329 Major depressive disorder, single episode, unspecified: Secondary | ICD-10-CM | POA: Diagnosis not present

## 2019-04-27 HISTORY — PX: ESOPHAGOGASTRODUODENOSCOPY (EGD) WITH PROPOFOL: SHX5813

## 2019-04-27 LAB — GLUCOSE, CAPILLARY: Glucose-Capillary: 99 mg/dL (ref 70–99)

## 2019-04-27 SURGERY — ESOPHAGOGASTRODUODENOSCOPY (EGD) WITH PROPOFOL
Anesthesia: Monitor Anesthesia Care

## 2019-04-27 MED ORDER — PANTOPRAZOLE SODIUM 40 MG PO TBEC: 40.0000 mg | DELAYED_RELEASE_TABLET | Freq: Two times a day (BID) | ORAL | 3 refills | Status: DC

## 2019-04-27 MED ORDER — PROPOFOL 500 MG/50ML IV EMUL
INTRAVENOUS | Status: DC | PRN
  Administered 2019-04-27: 150 ug/kg/min via INTRAVENOUS

## 2019-04-27 MED ORDER — LIDOCAINE 2% (20 MG/ML) 5 ML SYRINGE
INTRAMUSCULAR | Status: DC | PRN
  Administered 2019-04-27: 100 mg via INTRAVENOUS

## 2019-04-27 MED ORDER — LACTATED RINGERS IV SOLN
INTRAVENOUS | Status: DC
  Administered 2019-04-27: 1000 mL via INTRAVENOUS

## 2019-04-27 MED ORDER — LACTATED RINGERS IV SOLN: INTRAVENOUS | Status: DC | PRN

## 2019-04-27 MED ORDER — SODIUM CHLORIDE 0.9 % IV SOLN: INTRAVENOUS | Status: DC

## 2019-04-27 SURGICAL SUPPLY — 15 items

## 2019-04-27 NOTE — Transfer of Care (Signed)
Immediate Anesthesia Transfer of Care Note  Patient: Erika Woods  Procedure(s) Performed: ESOPHAGOGASTRODUODENOSCOPY (EGD) WITH PROPOFOL (N/A )  Patient Location: PACU and Endoscopy Unit  Anesthesia Type:MAC  Level of Consciousness: awake, alert , oriented and patient cooperative  Airway & Oxygen Therapy: Patient Spontanous Breathing and Patient connected to face mask oxygen  Post-op Assessment: Report given to RN and Post -op Vital signs reviewed and stable  Post vital signs: Reviewed and stable  Last Vitals:  Vitals Value Taken Time  BP    Temp    Pulse    Resp    SpO2      Last Pain:  Vitals:   04/27/19 1040  TempSrc: Oral  PainSc: 0-No pain         Complications: No apparent anesthesia complications

## 2019-04-27 NOTE — Op Note (Signed)
[facility] Patient Name: Erika Woods Procedure Date: 04/27/2019 MRN: [id number] Attending MD: Willis Modena , MD Date of Birth: [birth year] CSN: [id number] Age: 53 Admit Type: Outpatient Procedure:                Upper GI endoscopy Indications:              Follow-up of reflux esophagitis, cirrhosis Providers:                Willis Modena, MD, Janae Sauce. Steele Berg, RN, Kandice Robinsons, Technician, Koren Bound, Technician, Steffanie Dunn CRNA Referring MD:              Medicines:                Monitored Anesthesia Care Complications:            No immediate complications. Estimated Blood Loss:     Estimated blood loss: none. Procedure:                Pre-Anesthesia Assessment:                           - Prior to the procedure, a History and Physical                            was performed, and patient medications and                            allergies were reviewed. The patient's tolerance of                            previous anesthesia was also reviewed. The risks                            and benefits of the procedure and the sedation                            options and risks were discussed with the patient.                            All questions were answered, and informed consent                            was obtained. Prior Anticoagulants: The patient has                            taken no previous anticoagulant or antiplatelet                            agents. ASA Grade Assessment: III - A patient with  severe systemic disease. After reviewing the risks                            and benefits, the patient was deemed in                            satisfactory condition to undergo the procedure.                           After obtaining informed consent, the endoscope was                            passed under direct vision. Throughout the                             procedure, the patient's blood pressure, pulse, and                            oxygen saturations were monitored continuously. The                            GIF-H190 ([id number] Olympus gastroscope was                            introduced through the mouth, and advanced to the                            second part of duodenum. The upper GI endoscopy was                            accomplished without difficulty. The patient                            tolerated the procedure well. Scope In: Scope Out: Findings:      A 4 cm hiatal hernia was present.      LA Grade B (one or more mucosal breaks greater than 5 mm, not extending       between the tops of two mucosal folds) esophagitis was found.      The exam of the esophagus was otherwise normal.      The entire examined stomach was normal.      The duodenal bulb, first portion of the duodenum and second portion of       the duodenum were normal. Impression:               - 4 cm hiatal hernia.                           - LA Grade B reflux esophagitis.                           - Normal stomach.                           - Normal duodenal bulb, first portion of the  duodenum and second portion of the duodenum. Moderate Sedation:      None Recommendation:           - Patient has a contact number available for                            emergencies. The signs and symptoms of potential                            delayed complications were discussed with the                            patient. Return to normal activities tomorrow.                            Written discharge instructions were provided to the                            patient.                           - Discharge patient to home (ambulatory).                           - Resume previous diet today.                           - Follow an antireflux regimen indefinitely.                           - Stop Prilosec (not working).                            - Try Protonix (pantoprazole) 40 mg PO BID qac                            until further notice.                           - Return to GI clinic in 2 months.                           - Return to referring physician as previously                            scheduled. Procedure Code(s):        --- Professional ---                           [phone], Esophagogastroduodenoscopy, flexible,                            transoral; diagnostic, including collection of                            specimen(s) by brushing or washing, when performed                            (  separate procedure) Diagnosis Code(s):        --- Professional ---                           K44.9, Diaphragmatic hernia without obstruction or                            gangrene                           K21.00, Gastro-esophageal reflux disease with                            esophagitis, without bleeding CPT copyright 2019 American Medical Association. All rights reserved. The codes documented in this report are preliminary and upon coder review may  be revised to meet current compliance requirements. [name], MD 04/27/2019 11:57:55 AM This report has been signed electronically. Number of Addenda: 0

## 2019-04-27 NOTE — Anesthesia Postprocedure Evaluation (Signed)
Anesthesia Post Note  Patient: Erika Woods  Procedure(s) Performed: ESOPHAGOGASTRODUODENOSCOPY (EGD) WITH PROPOFOL (N/A )     Patient location during evaluation: PACU Anesthesia Type: MAC Level of consciousness: awake and alert Pain management: pain level controlled Vital Signs Assessment: post-procedure vital signs reviewed and stable Respiratory status: spontaneous breathing, nonlabored ventilation and respiratory function stable Cardiovascular status: blood pressure returned to baseline and stable Postop Assessment: no apparent nausea or vomiting Anesthetic complications: no    Last Vitals:  Vitals:   04/27/19 1220 04/27/19 1230  BP: 104/65 121/82  Pulse: 60 65  Resp: 18 (!) 22  Temp:    SpO2: 100% 96%    Last Pain:  Vitals:   04/27/19 1230  TempSrc:   PainSc: 0-No pain                 Jarome Matin Danitra Payano

## 2019-04-27 NOTE — Anesthesia Preprocedure Evaluation (Addendum)
Anesthesia Evaluation  Patient identified by MRN, date of birth, ID band Patient awake    Reviewed: Allergy & Precautions, NPO status , Patient's Chart, lab work & pertinent test results  Airway Mallampati: I  TM Distance: >3 FB Neck ROM: Full    Dental no notable dental hx. (+) Teeth Intact, Dental Advisory Given   Pulmonary sleep apnea and Continuous Positive Airway Pressure Ventilation ,    Pulmonary exam normal breath sounds clear to auscultation       Cardiovascular Normal cardiovascular exam Rhythm:Regular Rate:Normal  Hx/o PE in July 2020- on xarelto Hx/o thoracic aortic aneurysm- last CTA 07/15/18: Mild enlargement of the ascending thoracic aorta to 4.2 cm.   Neuro/Psych PSYCHIATRIC DISORDERS Anxiety Depression PTSDRestless legs syndrome    GI/Hepatic hiatal hernia, GERD  Controlled and Medicated,(+) Cirrhosis       , Esophagitis  Reflux currently asymptomatic   Endo/Other  diabetes, Well Controlled, Type 2, Oral Hypoglycemic AgentsMorbid obesity  Renal/GU Renal diseaseHx/o renal calculi  negative genitourinary   Musculoskeletal  (+) Arthritis , Osteoarthritis,    Abdominal (+) + obese,   Peds  Hematology negative hematology ROS (+)   Anesthesia Other Findings   Reproductive/Obstetrics negative OB ROS                            Anesthesia Physical Anesthesia Plan  ASA: III  Anesthesia Plan: MAC   Post-op Pain Management:    Induction:   PONV Risk Score and Plan: 2 and Propofol infusion, TIVA and Treatment may vary due to age or medical condition  Airway Management Planned: Natural Airway and Simple Face Mask  Additional Equipment: None  Intra-op Plan:   Post-operative Plan:   Informed Consent: I have reviewed the patients History and Physical, chart, labs and discussed the procedure including the risks, benefits and alternatives for the proposed anesthesia with the  patient or authorized representative who has indicated his/her understanding and acceptance.       Plan Discussed with: CRNA  Anesthesia Plan Comments:         Anesthesia Quick Evaluation

## 2019-04-27 NOTE — H&P (Signed)
Patient interval history reviewed.  Patient examined again.  There has been no change from documented H/P scanned into chart from our office except as documented below.  Assessment:  1.  Reflux esophagitis. 2.  Cirrhosis.  Plan:  1.  Endoscopy to assess for resolution of esophagitis. 2.  Risks (bleeding, infection, bowel perforation that could require surgery, sedation-related changes in cardiopulmonary systems), benefits (identification and possible treatment of source of symptoms, exclusion of certain causes of symptoms), and alternatives (watchful waiting, radiographic imaging studies, empiric medical treatment) of upper endoscopy (EGD) were explained to patient/family in detail and patient wishes to proceed.

## 2019-04-27 NOTE — Anesthesia Procedure Notes (Signed)
Procedure Name: MAC Performed by: Minerva Bluett L, CRNA Pre-anesthesia Checklist: Patient identified, Emergency Drugs available, Suction available, Patient being monitored and Timeout performed Patient Re-evaluated:Patient Re-evaluated prior to induction Oxygen Delivery Method: Simple face mask Preoxygenation: Pre-oxygenation with 100% oxygen Induction Type: IV induction Placement Confirmation: positive ETCO2 Dental Injury: Teeth and Oropharynx as per pre-operative assessment        

## 2019-04-27 NOTE — Discharge Instructions (Signed)
YOU HAD AN ENDOSCOPIC PROCEDURE TODAY: Refer to the procedure report and other information in the discharge instructions given to you for any specific questions about what was found during the examination. If this information does not answer your questions, please call [facility] office at [phone] to clarify.   YOU SHOULD EXPECT: Some feelings of bloating in the abdomen. Passage of more gas than usual. Walking can help get rid of the air that was put into your GI tract during the procedure and reduce the bloating. If you had a lower endoscopy (such as a colonoscopy or flexible sigmoidoscopy) you may notice spotting of blood in your stool or on the toilet paper. Some abdominal soreness may be present for a day or two, also.  DIET: Your first meal following the procedure should be a light meal and then it is ok to progress to your normal diet. A half-sandwich or bowl of soup is an example of a good first meal. Heavy or fried foods are harder to digest and may make you feel nauseous or bloated. Drink plenty of fluids but you should avoid alcoholic beverages for 24 hours. If you had a esophageal dilation, please see attached instructions for diet.   ACTIVITY: Your care partner should take you home directly after the procedure. You should plan to take it easy, moving slowly for the rest of the day. You can resume normal activity the day after the procedure however YOU SHOULD NOT DRIVE, use power tools, machinery or perform tasks that involve climbing or major physical exertion for 24 hours (because of the sedation medicines used during the test).   SYMPTOMS TO REPORT IMMEDIATELY: A gastroenterologist can be reached at any hour. Please call [phone]  for any of the following symptoms:   Following lower endoscopy (colonoscopy, flexible sigmoidoscopy) Excessive amounts of blood in the stool  Significant tenderness, worsening of abdominal pains  Swelling of the abdomen that is new, acute  Fever of 100  or higher   Following upper endoscopy (EGD, EUS, ERCP, esophageal dilation) Vomiting of blood or coffee ground material  New, significant abdominal pain  New, significant chest pain or pain under the shoulder blades  Painful or persistently difficult swallowing  New shortness of breath  Black, tarry-looking or red, bloody stools  FOLLOW UP:  If any biopsies were taken you will be contacted by phone or by letter within the next 1-3 weeks. Call [phone]  if you have not heard about the biopsies in 3 weeks.  Please also call with any specific questions about appointments or follow up tests. Endoscopy Care After Please read the instructions outlined below and refer to this sheet in the next few weeks. These discharge instructions provide you with general information on caring for yourself after you leave the hospital. Your doctor may also give you specific instructions. While your treatment has been planned according to the most current medical practices available, unavoidable complications occasionally occur. If you have any problems or questions after discharge, please call Dr. Paulita Fujita [facility]) at [phone].  HOME CARE INSTRUCTIONS Activity  You may resume your regular activity but move at a slower pace for the next 24 hours.   Take frequent rest periods for the next 24 hours.   Walking will help expel (get rid of) the air and reduce the bloated feeling in your abdomen.   No driving for 24 hours (because of the anesthesia (medicine) used during the test).   You may shower.   Do not sign any  important legal documents or operate any machinery for 24 hours (because of the anesthesia used during the test).  Nutrition  Drink plenty of fluids.   You may resume your normal diet.   Begin with a light meal and progress to your normal diet.   Avoid alcoholic beverages for 24 hours or as instructed by your caregiver.  Medications You may resume your normal  medications unless your caregiver tells you otherwise. What you can expect today  You may experience abdominal discomfort such as a feeling of fullness or "gas" pains.   You may experience a sore throat for 2 to 3 days. This is normal. Gargling with salt water may help this.    SEEK IMMEDIATE MEDICAL CARE IF:  You have excessive nausea (feeling sick to your stomach) and/or vomiting.   You have severe abdominal pain and distention (swelling).   You have trouble swallowing.   You have a temperature over 100 F (37.8 C).   You have rectal bleeding or vomiting of blood.  Document Released: 12/11/2003 Document Revised: 01/08/2011 Document Reviewed: 06/23/2007 [facility] Patient Information 2012 [place], [place].

## 2019-04-28 ENCOUNTER — Other Ambulatory Visit: Payer: Self-pay | Admitting: Gastroenterology

## 2019-04-28 DIAGNOSIS — K7460 Unspecified cirrhosis of liver: Secondary | ICD-10-CM

## 2019-05-10 ENCOUNTER — Ambulatory Visit
Admission: RE | Admit: 2019-05-10 | Discharge: 2019-05-10 | Disposition: A | Discharge status: Home / Self Care | Payer: BC Managed Care – PPO | Source: Ambulatory Visit | Attending: Gastroenterology | Admitting: Gastroenterology

## 2019-05-10 DIAGNOSIS — K7460 Unspecified cirrhosis of liver: Secondary | ICD-10-CM

## 2019-05-12 ENCOUNTER — Encounter (HOSPITAL_COMMUNITY): Payer: Self-pay

## 2019-05-12 ENCOUNTER — Ambulatory Visit (INDEPENDENT_AMBULATORY_CARE_PROVIDER_SITE_OTHER): Payer: BC Managed Care – PPO | Admitting: Cardiovascular Disease

## 2019-05-12 ENCOUNTER — Encounter

## 2019-05-12 ENCOUNTER — Ambulatory Visit (INDEPENDENT_AMBULATORY_CARE_PROVIDER_SITE_OTHER)
Admission: RE | Admit: 2019-05-12 | Discharge: 2019-05-12 | Disposition: A | Discharge status: Home / Self Care | Payer: BC Managed Care – PPO | Source: Ambulatory Visit | Attending: Cardiovascular Disease | Admitting: Cardiovascular Disease

## 2019-05-12 ENCOUNTER — Emergency Department (HOSPITAL_COMMUNITY)
Admission: EM | Admit: 2019-05-12 | Discharge: 2019-05-12 | Disposition: A | Discharge status: Home / Self Care | Payer: BC Managed Care – PPO | Attending: Emergency Medicine | Admitting: Emergency Medicine

## 2019-05-12 ENCOUNTER — Emergency Department (HOSPITAL_COMMUNITY): Payer: BC Managed Care – PPO

## 2019-05-12 ENCOUNTER — Encounter: Payer: Self-pay | Admitting: Cardiovascular Disease

## 2019-05-12 ENCOUNTER — Other Ambulatory Visit: Payer: Self-pay

## 2019-05-12 VITALS — BP 116/72 | HR 90 | Ht 65.0 in | Wt 320.4 lb

## 2019-05-12 DIAGNOSIS — W010XXA Fall on same level from slipping, tripping and stumbling without subsequent striking against object, initial encounter: Secondary | ICD-10-CM | POA: Insufficient documentation

## 2019-05-12 DIAGNOSIS — Y929 Unspecified place or not applicable: Secondary | ICD-10-CM | POA: Diagnosis not present

## 2019-05-12 DIAGNOSIS — Y999 Unspecified external cause status: Secondary | ICD-10-CM | POA: Insufficient documentation

## 2019-05-12 DIAGNOSIS — I712 Thoracic aortic aneurysm, without rupture, unspecified: Secondary | ICD-10-CM

## 2019-05-12 DIAGNOSIS — R519 Headache, unspecified: Secondary | ICD-10-CM | POA: Insufficient documentation

## 2019-05-12 DIAGNOSIS — Z79899 Other long term (current) drug therapy: Secondary | ICD-10-CM | POA: Diagnosis not present

## 2019-05-12 DIAGNOSIS — I351 Nonrheumatic aortic (valve) insufficiency: Secondary | ICD-10-CM

## 2019-05-12 DIAGNOSIS — E119 Type 2 diabetes mellitus without complications: Secondary | ICD-10-CM | POA: Insufficient documentation

## 2019-05-12 DIAGNOSIS — Z7984 Long term (current) use of oral hypoglycemic drugs: Secondary | ICD-10-CM | POA: Insufficient documentation

## 2019-05-12 DIAGNOSIS — Y939 Activity, unspecified: Secondary | ICD-10-CM | POA: Diagnosis not present

## 2019-05-12 DIAGNOSIS — Z7901 Long term (current) use of anticoagulants: Secondary | ICD-10-CM | POA: Insufficient documentation

## 2019-05-12 LAB — COMPREHENSIVE METABOLIC PANEL
ALT: 24 U/L (ref 0–44)
AST: 19 U/L (ref 15–41)
Albumin: 4 g/dL (ref 3.5–5.0)
Alkaline Phosphatase: 70 U/L (ref 38–126)
Anion gap: 11 (ref 5–15)
BUN: 17 mg/dL (ref 6–20)
CO2: 26 mmol/L (ref 22–32)
Calcium: 9 mg/dL (ref 8.9–10.3)
Chloride: 103 mmol/L (ref 98–111)
Creatinine, Ser: 1 mg/dL (ref 0.44–1.00)
GFR calc Af Amer: >60 mL/min (ref >60)
GFR calc non Af Amer: >60 mL/min (ref >60)
Glucose, Bld: 111 mg/dL — ABNORMAL HIGH (ref 70–99)
Potassium: 4.1 mmol/L (ref 3.5–5.1)
Sodium: 140 mmol/L (ref 135–145)
Total Bilirubin: 0.7 mg/dL (ref 0.3–1.2)
Total Protein: 6.7 g/dL (ref 6.5–8.1)

## 2019-05-12 LAB — CBC WITH DIFFERENTIAL/PLATELET
Abs Immature Granulocytes: 0.03 10*3/uL (ref 0.00–0.07)
Basophils Absolute: 0.1 10*3/uL (ref 0.0–0.1)
Basophils Relative: 1 %
Eosinophils Absolute: 0.6 10*3/uL — ABNORMAL HIGH (ref 0.0–0.5)
Eosinophils Relative: 8 %
HCT: 41.1 % (ref 36.0–46.0)
Hemoglobin: 12.4 g/dL (ref 12.0–15.0)
Immature Granulocytes: 0 %
Lymphocytes Relative: 28 %
Lymphs Abs: 2.1 10*3/uL (ref 0.7–4.0)
MCH: 27.6 pg (ref 26.0–34.0)
MCHC: 30.2 g/dL (ref 30.0–36.0)
MCV: 91.3 fL (ref 80.0–100.0)
Monocytes Absolute: 0.4 10*3/uL (ref 0.1–1.0)
Monocytes Relative: 6 %
Neutro Abs: 4.4 10*3/uL (ref 1.7–7.7)
Neutrophils Relative %: 57 %
Platelets: 239 10*3/uL (ref 150–400)
RBC: 4.5 MIL/uL (ref 3.87–5.11)
RDW: 14.9 % (ref 11.5–15.5)
WBC: 7.6 10*3/uL (ref 4.0–10.5)
nRBC: 0 % (ref 0.0–0.2)

## 2019-05-12 MED ORDER — DEXAMETHASONE SODIUM PHOSPHATE 10 MG/ML IJ SOLN
10.0000 mg | Freq: Once | INTRAMUSCULAR | Status: AC
  Administered 2019-05-12: 10 mg via INTRAVENOUS
  Filled 2019-05-12: qty 1

## 2019-05-12 MED ORDER — MORPHINE SULFATE (PF) 4 MG/ML IV SOLN
4.0000 mg | Freq: Once | INTRAVENOUS | Status: AC
  Administered 2019-05-12: 4 mg via INTRAVENOUS
  Filled 2019-05-12: qty 1

## 2019-05-12 MED ORDER — IOHEXOL 350 MG/ML SOLN
100.0000 mL | Freq: Once | INTRAVENOUS | Status: AC | PRN
  Administered 2019-05-12: 100 mL via INTRAVENOUS

## 2019-05-12 NOTE — ED Notes (Signed)
Patient in CT

## 2019-05-12 NOTE — Progress Notes (Signed)
Chief Complaint  Patient presents with  . Follow-up    Thoracic aortic aneurysm   History of Present Illness: 53 yo female with hstory of diabetes, sleep apnea on CPAP, prior pulmonary embolism and thoracic aortic aneurysm who is today for cardiac follow up. I saw her in August 2019 for evaluation of thoracic aortic aneurysm. She was admitted to [facility] in May 2019 with an acute left thigh superficial thrombosis and found to have a PE. She had a procedure on her knee in March 2019. Echo May 2019 with LVEF=65-70%. Mild AI, dilated aortic root. 4.0 cm ascending aortic aneurysm noted on CTA chest. Repeat chest CTA July 2019 with resolution of PE. She was readmitted to [facility] July 2020 with recurrent PE. She has been on Xarelto. Echo July 2020 with LVEF=55-60%, trivial AI, mild aortic root dilatation at 4.2 cm.   She is here today for follow up. The patient denies any chest pain, dyspnea, palpitations, lower extremity edema, orthopnea, PND, dizziness, near syncope or syncope.   Primary Care Physician: Dois Davenport, MD  Past Medical History:  Diagnosis Date  . Diabetes mellitus without complication ([name])   . Impaired fasting glucose   . Obstructive sleep apnea on CPAP   . Osteoarthritis of knee   . Personal history of pulmonary embolism   . Renal disorder    kideny stones  . Restless leg syndrome   . Thoracic aortic aneurysm [facility])     Past Surgical History:  Procedure Laterality Date  . arm surgery Bilateral Skin removal  . DILATATION & CURETTAGE/HYSTEROSCOPY WITH MYOSURE N/A 06/11/2018   Procedure: DILATATION & CURETTAGE/HYSTEROSCOPY WITH MYOSURE RESECTION OF ENDOMETRIAL THICKENING;  Surgeon: Richardean Chimera, MD;  Location: [facility] ORS;  Service: Gynecology;  Laterality: N/A;  BMI 56  . DILATION AND EVACUATION    . ESOPHAGOGASTRODUODENOSCOPY (EGD) WITH PROPOFOL N/A 07/15/2018   Procedure: ESOPHAGOGASTRODUODENOSCOPY (EGD) WITH PROPOFOL;  Surgeon: Willis Modena, MD;  Location: [facility] ENDOSCOPY;   Service: Endoscopy;  Laterality: N/A;  . ESOPHAGOGASTRODUODENOSCOPY (EGD) WITH PROPOFOL N/A 04/27/2019   Procedure: ESOPHAGOGASTRODUODENOSCOPY (EGD) WITH PROPOFOL;  Surgeon: Willis Modena, MD;  Location: [facility] ENDOSCOPY;  Service: Endoscopy;  Laterality: N/A;  . KIDNEY STONE SURGERY    . KNEE SURGERY Right   . TONSILLECTOMY      Current Outpatient Medications  Medication Sig Dispense Refill  . albuterol (PROVENTIL HFA;VENTOLIN HFA) 108 (90 Base) MCG/ACT inhaler Inhale 1-2 puffs into the lungs every 6 (six) hours as needed for wheezing or shortness of breath. 1 Inhaler 0  . gabapentin (NEURONTIN) 300 MG capsule Take 300 mg by mouth at bedtime.     . lamoTRIgine (LAMICTAL) 150 MG tablet Take 150 mg by mouth daily after breakfast.     . lansoprazole (PREVACID) 15 MG capsule Take 15 mg by mouth daily as needed (acid reflux).    . meloxicam (MOBIC) 15 MG tablet Take 15 mg by mouth daily.    . metFORMIN (GLUCOPHAGE) 500 MG tablet Take 500 mg by mouth 2 (two) times daily with a meal.     . oxymetazoline (AFRIN) 0.05 % nasal spray Place 1 spray into both nostrils at bedtime.    . pantoprazole (PROTONIX) 40 MG tablet Take 1 tablet (40 mg total) by mouth 2 (two) times daily before a meal. 180 tablet 3  . rivaroxaban (XARELTO) 20 MG TABS tablet Take 20 mg by mouth daily after breakfast.     . rOPINIRole (REQUIP XL) 4 MG 24 hr tablet Take 4-12 mg by  mouth at bedtime as needed (restless leg).     . traZODone (DESYREL) 100 MG tablet Take 200-300 mg by mouth at bedtime as needed for sleep.     . TRINTELLIX 10 MG TABS tablet Take 10 mg by mouth daily.      No current facility-administered medications for this visit.    Allergies  Allergen Reactions  . Sulfa Antibiotics Hives    Social History   Socioeconomic History  . Marital status: Married    Spouse name: Not on file  . Number of children: 2  . Years of education: Not on file  . Highest education level: Not on file  Occupational History  .  Occupation: Administratorre-school teacher  Tobacco Use  . Smoking status: Never Smoker  . Smokeless tobacco: Never Used  Substance and Sexual Activity  . Alcohol use: Yes    Comment: "wine once or twice a month"  . Drug use: No  . Sexual activity: Not on file  Other Topics Concern  . Not on file  Social History Narrative  . Not on file   Social Determinants of Health   Financial Resource Strain:   . Difficulty of Paying Living Expenses: Not on file  Food Insecurity:   . Worried About Programme researcher, broadcasting/film/videounning Out of Food in the Last Year: Not on file  . Ran Out of Food in the Last Year: Not on file  Transportation Needs:   . Lack of Transportation (Medical): Not on file  . Lack of Transportation (Non-Medical): Not on file  Physical Activity:   . Days of Exercise per Week: Not on file  . Minutes of Exercise per Session: Not on file  Stress:   . Feeling of Stress : Not on file  Social Connections:   . Frequency of Communication with Friends and Family: Not on file  . Frequency of Social Gatherings with Friends and Family: Not on file  . Attends Religious Services: Not on file  . Active Member of Clubs or Organizations: Not on file  . Attends BankerClub or Organization Meetings: Not on file  . Marital Status: Not on file  Intimate Partner Violence:   . Fear of Current or Ex-Partner: Not on file  . Emotionally Abused: Not on file  . Physically Abused: Not on file  . Sexually Abused: Not on file    Family History  Adopted: Yes    Review of Systems:  As stated in the HPI and otherwise negative.   BP 116/72   Pulse 90   Ht 5\' 5"  (1.651 m)   Wt (!) 320 lb 6.4 oz (145.3 kg)   LMP 01/29/2013   SpO2 95%   BMI 53.32 kg/m   Physical Examination:  General: Well developed, well nourished, NAD  HEENT: OP clear, mucus membranes moist  SKIN: warm, dry. No rashes. Neuro: No focal deficits  Musculoskeletal: Muscle strength 5/5 all ext  Psychiatric: Mood and affect normal  Neck: No JVD, no carotid bruits,  no thyromegaly, no lymphadenopathy.  Lungs:Clear bilaterally, no wheezes, rhonci, crackles Cardiovascular: Regular rate and rhythm. No murmurs, gallops or rubs. Abdomen:Soft. Bowel sounds present. Non-tender.  Extremities: No lower extremity edema. Pulses are 2 + in the bilateral DP/PT.  EKG:  EKG is ordered today. The ekg ordered today demonstrates   Echo July 2020:  1. The left ventricle has normal systolic function, with an ejection fraction of 55-60%. The cavity size was normal. Left ventricular diastolic Doppler parameters are consistent with impaired relaxation. No evidence of left  ventricular regional wall  motion abnormalities.  2. The right ventricle has normal systolic function. The cavity was normal. There is no increase in right ventricular wall thickness.  3. The mitral valve is grossly normal.  4. The aortic valve is tricuspid. Mild sclerosis of the aortic valve. Aortic valve regurgitation is trivial by color flow Doppler. No stenosis of the aortic valve.  5. There is mild dilatation of the ascending aorta measuring 42 mm.  Recent Labs: 11/11/2018: B Natriuretic Peptide 70.1; Magnesium 2.0 05/12/2019: ALT 24; BUN 17; Creatinine, Ser 1.00; Hemoglobin 12.4; Platelets 239; Potassium 4.1; Sodium 140   Lipid Panel    Component Value Date/Time   CHOL 169 02/18/2018 0623   TRIG 113 02/18/2018 0623   HDL 43 02/18/2018 0623   CHOLHDL 3.9 02/18/2018 0623   VLDL 23 02/18/2018 0623   LDLCALC 103 (H) 02/18/2018 0623     Wt Readings from Last 3 Encounters:  05/12/19 (!) 320 lb 6.4 oz (145.3 kg)  04/27/19 (!) 315 lb 4.1 oz (143 kg)  11/11/18 (!) 320 lb (145.2 kg)     Other studies Reviewed: Additional studies/ records that were reviewed today include:  Review of the above records demonstrates:    Assessment and Plan:   1. Thoracic aortic aneurysm: 4.0-4.2 cm by all scans over the past two years. Chest CTA performed this am with official read pending. Repeat scan in one year.    2. Aortic insufficiency: Trivial by echo in July 2020.    Current medicines are reviewed at length with the patient today.  The patient does not have concerns regarding medicines.  The following changes have been made:   Labs/ tests ordered today include:   Orders Placed This Encounter  Procedures  . CT ANGIO CHEST AORTA W/CM & OR WO/CM  . Basic metabolic panel     Disposition:   FU with me in 12 months   Signed, Lauree Chandler, MD 05/12/2019 10:42 AM    [facility] [address], [place], [place]  [address] Phone: [phone]; Fax: [phone]

## 2019-05-12 NOTE — Patient Instructions (Signed)
Medication Instructions:  Your physician recommends that you continue on your current medications as directed. Please refer to the Current Medication list given to you today.  *If you need a refill on your cardiac medications before your next appointment, please call your pharmacy*  Lab Work: Your physician recommends that you return for lab work (BMET) in December 2021 before your Chest CT  If you have labs (blood work) drawn today and your tests are completely normal, you will receive your results only by: Marland Kitchen MyChart Message (if you have MyChart) OR . A paper copy in the mail If you have any lab test that is abnormal or we need to change your treatment, we will call you to review the results.  Testing/Procedures: Your provider would like for you to have a Chest CT of the Aorta in December 2021 or January 2022  Follow-Up: At [facility], you and your health needs are our priority.  As part of our continuing mission to provide you with exceptional heart care, we have created designated Provider Care Teams.  These Care Teams include your primary Cardiologist (physician) and Advanced Practice Providers (APPs -  Physician Assistants and Nurse Practitioners) who all work together to provide you with the care you need, when you need it.  Your next appointment:   12 month(s)  The format for your next appointment:   In Person  Provider:   You may see Lauree Chandler, MD or one of the following Advanced Practice Providers on your designated Care Team:    Melina Copa, PA-C  Ermalinda Barrios, PA-C   Other Instructions

## 2019-05-12 NOTE — ED Notes (Signed)
Patient ambulated to the restroom with assistance, but with no help.

## 2019-05-12 NOTE — ED Triage Notes (Addendum)
Pt states that she fell this morning and hit her head. She reports that she started experiencing a headache around 11p. Reports that she takes Xarelto for a hx of PEs. Describes it as the worst headache of her life.

## 2019-05-12 NOTE — Discharge Instructions (Addendum)
Please call to schedule a recheck with your doctor in the next 1-2 days.   Return to the emergency department with any recurrent symptoms of headache, or for new concern. Take your regular medications as prescribed.

## 2019-05-12 NOTE — ED Provider Notes (Signed)
[facility] DEPT Provider Note   CSN: [id number] Arrival date & time: 05/12/19  0106     History Chief Complaint  Patient presents with  . Headache    Erika Woods is a 53 y.o. female.  Patient to ED with complaint of sudden onset generalized headache that started at 11:00 pm (3 hours ago). She reports having fallen earlier in the day after tripping, and hit the right side of her head on the pavement. No LOC. She denies neck pain, chest/abdominal pain. She reports an abrasion to her wrist without pain with movement or use. She tried taking Tylenol for the headache without relief. No nausea or vomiting. She denies history of headaches. She is taking Xarelto for diagnosis of PE. No visual changes, speech difficulty, imbalance or extremity weakness.   The history is provided by the patient. No language interpreter was used.  Headache Associated symptoms: no fever, no nausea, no neck pain, no numbness, no vomiting and no weakness        Past Medical History:  Diagnosis Date  . Diabetes mellitus without complication ([place])   . Impaired fasting glucose   . Obstructive sleep apnea on CPAP   . Osteoarthritis of knee   . Personal history of pulmonary embolism   . Renal disorder    kideny stones  . Restless leg syndrome   . Thoracic aortic aneurysm [facility])     Patient Active Problem List   Diagnosis Date Noted  . Acute pulmonary embolism ([place]) 11/11/2018  . Severe recurrent major depression without psychotic features ([place]) 02/17/2018  . Obesity, Class III, BMI 40-49.9 (morbid obesity) ([place]) 09/16/2017  . Acute pulmonary embolism with acute cor pulmonale ([place]) 09/16/2017  . Acute superficial venous thrombosis of left lower extremity   . Thoracic aortic aneurysm without rupture ([place])   . Pulmonary emboli ([place]) 09/15/2017  . PTSD (post-traumatic stress disorder) 08/18/2011  . DM (diabetes mellitus) ([place]) 08/18/2011  . Sleep apnea 08/18/2011  . Mood  disorder ([place]) 08/18/2011    Past Surgical History:  Procedure Laterality Date  . arm surgery Bilateral Skin removal  . DILATATION & CURETTAGE/HYSTEROSCOPY WITH MYOSURE N/A 06/11/2018   Procedure: DILATATION & CURETTAGE/HYSTEROSCOPY WITH MYOSURE RESECTION OF ENDOMETRIAL THICKENING;  Surgeon: [name], MD;  Location: [facility];  Service: Gynecology;  Laterality: N/A;  BMI 56  . DILATION AND EVACUATION    . ESOPHAGOGASTRODUODENOSCOPY (EGD) WITH PROPOFOL N/A 07/15/2018   Procedure: ESOPHAGOGASTRODUODENOSCOPY (EGD) WITH PROPOFOL;  Surgeon: [name], MD;  Location: [facility];  Service: Endoscopy;  Laterality: N/A;  . ESOPHAGOGASTRODUODENOSCOPY (EGD) WITH PROPOFOL N/A 04/27/2019   Procedure: ESOPHAGOGASTRODUODENOSCOPY (EGD) WITH PROPOFOL;  Surgeon: [name], MD;  Location: [facility];  Service: Endoscopy;  Laterality: N/A;  . KIDNEY STONE SURGERY    . KNEE SURGERY Right   . TONSILLECTOMY       OB History   No obstetric history on file.     Family History  Adopted: Yes    Social History   Tobacco Use  . Smoking status: Never Smoker  . Smokeless tobacco: Never Used  Substance Use Topics  . Alcohol use: Yes    Comment: "wine once or twice a month"  . Drug use: No    Home Medications Prior to Admission medications   Medication Sig Start Date End Date Taking? Authorizing Provider  albuterol (PROVENTIL HFA;VENTOLIN HFA) 108 (90 Base) MCG/ACT inhaler Inhale 1-2 puffs into the lungs every 6 (six) hours as needed for wheezing or shortness  of breath. 02/06/18   Arby Barrette, MD  gabapentin (NEURONTIN) 300 MG capsule Take 300 mg by mouth at bedtime.  11/06/18   [provider]  lamoTRIgine (LAMICTAL) 150 MG tablet Take 150 mg by mouth daily. 06/30/18   [provider]  lansoprazole (PREVACID) 15 MG capsule Take 15 mg by mouth daily as needed (acid reflux).    [provider]  metFORMIN (GLUCOPHAGE) 500 MG tablet Take 500 mg by mouth 2 (two)  times daily with a meal.  10/28/13   [provider]  oxymetazoline (AFRIN) 0.05 % nasal spray Place 1 spray into both nostrils at bedtime.    [provider]  pantoprazole (PROTONIX) 40 MG tablet Take 1 tablet (40 mg total) by mouth 2 (two) times daily before a meal. 04/27/19 04/26/20  Willis Modena, MD  rivaroxaban (XARELTO) 20 MG TABS tablet Take 20 mg by mouth daily with supper.    [provider]  rOPINIRole (REQUIP XL) 4 MG 24 hr tablet Take 4-12 mg by mouth at bedtime.     [provider]  traZODone (DESYREL) 100 MG tablet Take 200-300 mg by mouth at bedtime.     [provider]  TRINTELLIX 10 MG TABS tablet Take 10 mg by mouth daily.  10/27/18   [provider]    Allergies    Sulfa antibiotics  Review of Systems   Review of Systems  Constitutional: Negative for chills and fever.  HENT: Negative.   Respiratory: Negative.  Negative for shortness of breath.   Cardiovascular: Negative.  Negative for chest pain.  Gastrointestinal: Negative.  Negative for nausea and vomiting.  Musculoskeletal: Negative.  Negative for neck pain.  Skin: Negative.  Negative for wound.  Neurological: Positive for headaches. Negative for speech difficulty, weakness and numbness.    Physical Exam Updated Vital Signs BP (!) 137/91 (BP Location: Right Arm)   Pulse 63   Temp 98.7 F (37.1 C)   Resp 16   LMP 01/29/2013   SpO2 97%   Physical Exam Vitals and nursing note reviewed.  Constitutional:      Appearance: She is well-developed.  HENT:     Head: Normocephalic.  Eyes:     General: No visual field deficit. Cardiovascular:     Rate and Rhythm: Normal rate and regular rhythm.  Pulmonary:     Effort: Pulmonary effort is normal.     Breath sounds: Normal breath sounds.  Abdominal:     General: Bowel sounds are normal.     Palpations: Abdomen is soft.     Tenderness: There is no abdominal tenderness. There is no guarding or rebound.   Musculoskeletal:        General: Normal range of motion.     Cervical back: Normal range of motion and neck supple.  Skin:    General: Skin is warm and dry.     Findings: No rash.  Neurological:     Mental Status: She is alert and oriented to person, place, and time.     GCS: GCS eye subscore is 4. GCS verbal subscore is 5. GCS motor subscore is 6.     Cranial Nerves: No dysarthria or facial asymmetry.     Sensory: No sensory deficit.     Motor: No weakness.     Coordination: Coordination normal.     Comments: CN's 3-12 intact.      ED Results / Procedures / Treatments   Labs (all labs ordered are listed, but only abnormal  results are displayed) Labs Reviewed  CBC WITH DIFFERENTIAL/PLATELET  COMPREHENSIVE METABOLIC PANEL    EKG None  Radiology US Abdomen Limited RUQ  Result Date: 05/10/2019 CLINICAL DATA:  Follow-up cirrhosis EXAM: ULTRASOUND ABDOMEN LIMITED RIGHT UPPER QUADRANT COMPARISON:  06/28/2018 FINDINGS: Gallbladder: No gallstones or wall thickening visualized. No sonographic Murphy sign noted by sonographer. Common bile duct: Diameter: 2.3 mm Liver: Heterogeneity of the liver is noted with overall increased echogenicity. No focal mass lesion is noted. Portal vein is patent on color Doppler imaging with normal direction of blood flow towards the liver. Other: None. IMPRESSION: Increased echogenicity of the liver with heterogeneity consistent with fatty infiltration or generalized hepatocellular disease. The overall appearance is stable from the prior study. Electronically Signed   By: Alcide Clever M.D.   On: 05/10/2019 09:51  CT Head Wo Contrast  Result Date: 05/12/2019 CLINICAL DATA:  Headache, head trauma. Fall. On anticoagulation. EXAM: CT HEAD WITHOUT CONTRAST TECHNIQUE: Contiguous axial images were obtained from the base of the skull through the vertex without intravenous contrast. COMPARISON:  None. FINDINGS: Brain: No intracranial hemorrhage, mass effect, or  midline shift. No hydrocephalus. The basilar cisterns are patent. No evidence of territorial infarct or acute ischemia. No extra-axial or intracranial fluid collection. Vascular: No hyperdense vessel. Skull: No fracture or focal lesion. Sinuses/Orbits: Mild mucosal thickening of ethmoid air cells and maxillary sinuses. No fluid level or evidence of fracture mastoid air cells are clear. Other: None. IMPRESSION: No acute intracranial abnormality. No skull fracture. Electronically Signed   By: Narda Rutherford M.D.   On: 05/12/2019 03:21   US Abdomen Limited RUQ  Result Date: 05/10/2019 CLINICAL DATA:  Follow-up cirrhosis EXAM: ULTRASOUND ABDOMEN LIMITED RIGHT UPPER QUADRANT COMPARISON:  06/28/2018 FINDINGS: Gallbladder: No gallstones or wall thickening visualized. No sonographic Murphy sign noted by sonographer. Common bile duct: Diameter: 2.3 mm Liver: Heterogeneity of the liver is noted with overall increased echogenicity. No focal mass lesion is noted. Portal vein is patent on color Doppler imaging with normal direction of blood flow towards the liver. Other: None. IMPRESSION: Increased echogenicity of the liver with heterogeneity consistent with fatty infiltration or generalized hepatocellular disease. The overall appearance is stable from the prior study. Electronically Signed   By: Alcide Clever M.D.   On: 05/10/2019 09:51     Procedures Procedures (including critical care time)  Medications Ordered in ED Medications  dexamethasone (DECADRON) injection 10 mg (has no administration in time range)    ED Course  I have reviewed the triage vital signs and the nursing notes.  Pertinent labs & imaging results that were available during my care of the patient were reviewed by me and considered in my medical decision making (see chart for details).    MDM Rules/Calculators/A&P                      Patient to ED with severe, sudden onset headache as further described in the HPI.   The patient  is neurologically intact. Head CT pending to evaluate concern for acute bleed given presentation and anticoagulation. IV decadron ordered for headache. Will monitor closely.   Pain not much improved with decadron. IV morphine provided. Patient resting comfortably.   Head CT is negative for acute bleed. On re-evaluation, she reports the headache is better but she feels uncomfortable generally, "like a weight is sitting on my body", likely from the morphine dose. She has been ambulatory and is steady. No increase in HA while walking. Will  observe for a while longer and re-evaluate.   She is feeling better on recheck. Headache is well controlled. VSS. She is felt appropriate for discharge home and is agreeable to going home. Return precautions discussed.   Final Clinical Impression(s) / ED Diagnoses Final diagnoses:  None   1. Headache  Rx / DC Orders ED Discharge Orders    None       Elpidio AnisUpstill, Khalil Belote, Cordelia Poche-C 05/12/19 [id number]    Marily MemosMesner, Jason, MD 05/12/19 [phone]

## 2019-05-26 ENCOUNTER — Other Ambulatory Visit: Payer: BC Managed Care – PPO

## 2019-06-07 ENCOUNTER — Encounter: Payer: Self-pay | Admitting: Pulmonary Disease

## 2019-06-07 ENCOUNTER — Telehealth: Payer: Self-pay | Admitting: Pulmonary Disease

## 2019-06-07 ENCOUNTER — Other Ambulatory Visit: Payer: Self-pay | Admitting: Pulmonary Disease

## 2019-06-07 DIAGNOSIS — E1169 Type 2 diabetes mellitus with other specified complication: Secondary | ICD-10-CM

## 2019-06-07 DIAGNOSIS — E66813 Obesity, class 3: Secondary | ICD-10-CM

## 2019-06-07 DIAGNOSIS — U071 COVID-19: Secondary | ICD-10-CM

## 2019-06-07 NOTE — Progress Notes (Signed)
06/07/19   I connected by phone with [name] on 06/07/2019 at 12:26 PM to discuss the potential use of an new treatment for mild to moderate COVID-19 viral infection in non-hospitalized patients.  This patient is a 54 y.o. female that meets the FDA criteria for Emergency Use Authorization of bamlanivimab or casirivimab\imdevimab.  Has a (+) direct SARS-CoV-2 viral test result  Has mild or moderate COVID-19   Is ? 55 years of age and weighs ? 40 kg  Is NOT hospitalized due to COVID-19  Is NOT requiring oxygen therapy or requiring an increase in baseline oxygen flow rate due to COVID-19  Is within 10 days of symptom onset  Has at least one of the high risk factor(s) for progression to severe COVID-19 and/or hospitalization as defined in EUA.  Specific high risk criteria : BMI >/= 35   Patient had exposure to husband who is currently Covid positive.  Husband symptoms started on 06/03/2019.  Patients husband has been quarantining since diagnosis on 06/03/2019.  Patient reporting that she woke up with congestion, dry cough, headache, body aches as well as labored breathing and a fever of 99.9.  Oxygen levels are stable at 95%.  She went to an urgent care today (06/07/2019) and she was tested for SARS-CoV-2.  Rapid test was positive.  06/07/19 -SARS-CoV-2-rapid test- positive   I have spoken and communicated the following to the patient or parent/caregiver:  1. FDA has authorized the emergency use of bamlanivimab and casirivimab\imdevimab for the treatment of mild to moderate COVID-19 in adults and pediatric patients with positive results of direct SARS-CoV-2 viral testing who are 75 years of age and older weighing at least 40 kg, and who are at high risk for progressing to severe COVID-19 and/or hospitalization.  2. The significant known and potential risks and benefits of bamlanivimab and casirivimab\imdevimab, and the extent to which such potential risks and benefits are  unknown.  3. Information on available alternative treatments and the risks and benefits of those alternatives, including clinical trials.  4. Patients treated with bamlanivimab and casirivimab\imdevimab should continue to self-isolate and use infection control measures (e.g., wear mask, isolate, social distance, avoid sharing personal items, clean and disinfect "high touch" surfaces, and frequent handwashing) according to CDC guidelines.   5. The patient or parent/caregiver has the option to accept or refuse bamlanivimab or casirivimab\imdevimab .  After reviewing this information with the patient, The patient agreed to proceed with receiving the bamlanimivab infusion and will be provided a copy of the Fact sheet prior to receiving the infusion.   Plan: I have scheduled the patient for 06/10/2019 at 12:30 PM for the monoclonal antibody infusion.  Patient qualifies based off of BMI and type 2 diabetes.  Explained to patient that she can rotate Tylenol 500 mg every 6 hours and/or ibuprofen 400 mg every 6 hours for management of temperature  Encouraged patient as well as spouse to self isolate at home.  Encouraged him to wear mask as patient's 61 year old daughter still lives with them.  Encouraged patient's daughter to obtain testing if she becomes symptomatic.  Just treat herself as if she is positive at this point in time and self isolate and wear a mask  Directions given Patient to bring copy of Covid test  We will route message to PCP as Adonis Huguenin 06/07/2019 12:26 PM

## 2019-06-07 NOTE — Telephone Encounter (Signed)
06/07/19   I connected by phone with Erika Woods on 06/07/2019 at 12:26 PM to discuss the potential use of an new treatment for mild to moderate COVID-19 viral infection in non-hospitalized patients.  This patient is a 54 y.o. female that meets the FDA criteria for Emergency Use Authorization of bamlanivimab or casirivimab\imdevimab.  Has a (+) direct SARS-CoV-2 viral test result  Has mild or moderate COVID-19   Is ? 54 years of age and weighs ? 40 kg  Is NOT hospitalized due to COVID-19  Is NOT requiring oxygen therapy or requiring an increase in baseline oxygen flow rate due to COVID-19  Is within 10 days of symptom onset  Has at least one of the high risk factor(s) for progression to severe COVID-19 and/or hospitalization as defined in EUA.  Specific high risk criteria : BMI >/= 35   Patient had exposure to husband who is currently Covid positive.  Husband symptoms started on 06/03/2019.  Patients husband has been quarantining since diagnosis on 06/03/2019.  Patient reporting that she woke up with congestion, dry cough, headache, body aches as well as labored breathing and a fever of 99.9.  Oxygen levels are stable at 95%.  She went to an urgent care today (06/07/2019) and she was tested for SARS-CoV-2.  Rapid test was positive.  06/07/19 -SARS-CoV-2-rapid test- positive   I have spoken and communicated the following to the patient or parent/caregiver:  1. FDA has authorized the emergency use of bamlanivimab and casirivimab\imdevimab for the treatment of mild to moderate COVID-19 in adults and pediatric patients with positive results of direct SARS-CoV-2 viral testing who are 3 years of age and older weighing at least 40 kg, and who are at high risk for progressing to severe COVID-19 and/or hospitalization.  2. The significant known and potential risks and benefits of bamlanivimab and casirivimab\imdevimab, and the extent to which such potential risks and benefits are  unknown.  3. Information on available alternative treatments and the risks and benefits of those alternatives, including clinical trials.  4. Patients treated with bamlanivimab and casirivimab\imdevimab should continue to self-isolate and use infection control measures (e.g., wear mask, isolate, social distance, avoid sharing personal items, clean and disinfect "high touch" surfaces, and frequent handwashing) according to CDC guidelines.   5. The patient or parent/caregiver has the option to accept or refuse bamlanivimab or casirivimab\imdevimab .  After reviewing this information with the patient, The patient agreed to proceed with receiving the bamlanimivab infusion and will be provided a copy of the Fact sheet prior to receiving the infusion.   Plan: I have scheduled the patient for 06/10/2019 at 12:30 PM for the monoclonal antibody infusion.  Patient qualifies based off of BMI and type 2 diabetes.  Explained to patient that she can rotate Tylenol 500 mg every 6 hours and/or ibuprofen 400 mg every 6 hours for management of temperature  Encouraged patient as well as spouse to self isolate at home.  Encouraged him to wear mask as patient's 48 year old daughter still lives with them.  Encouraged patient's daughter to obtain testing if she becomes symptomatic.  Just treat herself as if she is positive at this point in time and self isolate and wear a mask  We will route message to PCP as Adonis Huguenin 06/07/2019 12:26 PM

## 2019-06-09 ENCOUNTER — Ambulatory Visit (HOSPITAL_COMMUNITY)
Admission: RE | Admit: 2019-06-09 | Discharge: 2019-06-09 | Disposition: A | Discharge status: Home / Self Care | Payer: BC Managed Care – PPO | Source: Ambulatory Visit | Attending: Pulmonary Disease | Admitting: Pulmonary Disease

## 2019-06-09 DIAGNOSIS — U071 COVID-19: Secondary | ICD-10-CM

## 2019-06-09 MED ORDER — ALBUTEROL SULFATE HFA 108 (90 BASE) MCG/ACT IN AERS: 2.0000 | INHALATION_SPRAY | Freq: Once | RESPIRATORY_TRACT | Status: DC | PRN

## 2019-06-09 MED ORDER — SODIUM CHLORIDE 0.9 % IV SOLN
700.0000 mg | Freq: Once | INTRAVENOUS | Status: AC
  Administered 2019-06-09: 700 mg via INTRAVENOUS
  Filled 2019-06-09: qty 20

## 2019-06-09 MED ORDER — SODIUM CHLORIDE 0.9 % IV SOLN
INTRAVENOUS | Status: DC | PRN
  Administered 2019-06-09: 250 mL via INTRAVENOUS

## 2019-06-09 MED ORDER — FAMOTIDINE IN NACL 20-0.9 MG/50ML-% IV SOLN: 20.0000 mg | Freq: Once | INTRAVENOUS | Status: DC | PRN

## 2019-06-09 MED ORDER — DIPHENHYDRAMINE HCL 50 MG/ML IJ SOLN: 50.0000 mg | Freq: Once | INTRAMUSCULAR | Status: DC | PRN

## 2019-06-09 MED ORDER — EPINEPHRINE 0.3 MG/0.3ML IJ SOAJ: 0.3000 mg | Freq: Once | INTRAMUSCULAR | Status: DC | PRN

## 2019-06-09 MED ORDER — METHYLPREDNISOLONE SODIUM SUCC 125 MG IJ SOLR: 125.0000 mg | Freq: Once | INTRAMUSCULAR | Status: DC | PRN

## 2019-06-09 NOTE — Discharge Instructions (Signed)
COVID-19 COVID-19 is a respiratory infection that is caused by a virus called severe acute respiratory syndrome coronavirus 2 (SARS-CoV-2). The disease is also known as coronavirus disease or novel coronavirus. In some people, the virus may not cause any symptoms. In others, it may cause a serious infection. The infection can get worse quickly and can lead to complications, such as:  Pneumonia, or infection of the lungs.  Acute respiratory distress syndrome or ARDS. This is a condition in which fluid build-up in the lungs prevents the lungs from filling with air and passing oxygen into the blood.  Acute respiratory failure. This is a condition in which there is not enough oxygen passing from the lungs to the body or when carbon dioxide is not passing from the lungs out of the body.  Sepsis or septic shock. This is a serious bodily reaction to an infection.  Blood clotting problems.  Secondary infections due to bacteria or fungus.  Organ failure. This is when your body's organs stop working. The virus that causes COVID-19 is contagious. This means that it can spread from person to person through droplets from coughs and sneezes (respiratory secretions). What are the causes? This illness is caused by a virus. You may catch the virus by:  Breathing in droplets from an infected person. Droplets can be spread by a person breathing, speaking, singing, coughing, or sneezing.  Touching something, like a table or a doorknob, that was exposed to the virus (contaminated) and then touching your mouth, nose, or eyes. What increases the risk? Risk for infection You are more likely to be infected with this virus if you:  Are within 6 feet (2 meters) of a person with COVID-19.  Provide care for or live with a person who is infected with COVID-19.  Spend time in crowded indoor spaces or live in shared housing. Risk for serious illness You are more likely to become seriously ill from the virus if  you:  Are 50 years of age or older. The higher your age, the more you are at risk for serious illness.  Live in a nursing home or long-term care facility.  Have cancer.  Have a long-term (chronic) disease such as: ? Chronic lung disease, including chronic obstructive pulmonary disease or asthma. ? A long-term disease that lowers your body's ability to fight infection (immunocompromised). ? Heart disease, including heart failure, a condition in which the arteries that lead to the heart become narrow or blocked (coronary artery disease), a disease which makes the heart muscle thick, weak, or stiff (cardiomyopathy). ? Diabetes. ? Chronic kidney disease. ? Sickle cell disease, a condition in which red blood cells have an abnormal "sickle" shape. ? Liver disease.  Are obese. What are the signs or symptoms? Symptoms of this condition can range from mild to severe. Symptoms may appear any time from 2 to 14 days after being exposed to the virus. They include:  A fever or chills.  A cough.  Difficulty breathing.  Headaches, body aches, or muscle aches.  Runny or stuffy (congested) nose.  A sore throat.  New loss of taste or smell. Some people may also have stomach problems, such as nausea, vomiting, or diarrhea. Other people may not have any symptoms of COVID-19. How is this diagnosed? This condition may be diagnosed based on:  Your signs and symptoms, especially if: ? You live in an area with a COVID-19 outbreak. ? You recently traveled to or from an area where the virus is common. ? You   provide care for or live with a person who was diagnosed with COVID-19. ? You were exposed to a person who was diagnosed with COVID-19.  A physical exam.  Lab tests, which may include: ? Taking a sample of fluid from the back of your nose and throat (nasopharyngeal fluid), your nose, or your throat using a swab. ? A sample of mucus from your lungs (sputum). ? Blood tests.  Imaging tests,  which may include, X-rays, CT scan, or ultrasound. How is this treated? At present, there is no medicine to treat COVID-19. Medicines that treat other diseases are being used on a trial basis to see if they are effective against COVID-19. Your health care provider will talk with you about ways to treat your symptoms. For most people, the infection is mild and can be managed at home with rest, fluids, and over-the-counter medicines. Treatment for a serious infection usually takes places in a hospital intensive care unit (ICU). It may include one or more of the following treatments. These treatments are given until your symptoms improve.  Receiving fluids and medicines through an IV.  Supplemental oxygen. Extra oxygen is given through a tube in the nose, a face mask, or a hood.  Positioning you to lie on your stomach (prone position). This makes it easier for oxygen to get into the lungs.  Continuous positive airway pressure (CPAP) or bi-level positive airway pressure (BPAP) machine. This treatment uses mild air pressure to keep the airways open. A tube that is connected to a motor delivers oxygen to the body.  Ventilator. This treatment moves air into and out of the lungs by using a tube that is placed in your windpipe.  Tracheostomy. This is a procedure to create a hole in the neck so that a breathing tube can be inserted.  Extracorporeal membrane oxygenation (ECMO). This procedure gives the lungs a chance to recover by taking over the functions of the heart and lungs. It supplies oxygen to the body and removes carbon dioxide. Follow these instructions at home: Lifestyle  If you are sick, stay home except to get medical care. Your health care provider will tell you how long to stay home. Call your health care provider before you go for medical care.  Rest at home as told by your health care provider.  Do not use any products that contain nicotine or tobacco, such as cigarettes,  e-cigarettes, and chewing tobacco. If you need help quitting, ask your health care provider.  Return to your normal activities as told by your health care provider. Ask your health care provider what activities are safe for you. General instructions  Take over-the-counter and prescription medicines only as told by your health care provider.  Drink enough fluid to keep your urine pale yellow.  Keep all follow-up visits as told by your health care provider. This is important. How is this prevented?  There is no vaccine to help prevent COVID-19 infection. However, there are steps you can take to protect yourself and others from this virus. To protect yourself:   Do not travel to areas where COVID-19 is a risk. The areas where COVID-19 is reported change often. To identify high-risk areas and travel restrictions, check the CDC travel website: wwwnc.cdc.gov/travel/notices  If you live in, or must travel to, an area where COVID-19 is a risk, take precautions to avoid infection. ? Stay away from people who are sick. ? Wash your hands often with soap and water for 20 seconds. If soap and water   are not available, use an alcohol-based hand sanitizer. ? Avoid touching your mouth, face, eyes, or nose. ? Avoid going out in public, follow guidance from your state and local health authorities. ? If you must go out in public, wear a cloth face covering or face mask. Make sure your mask covers your nose and mouth. ? Avoid crowded indoor spaces. Stay at least 6 feet (2 meters) away from others. ? Disinfect objects and surfaces that are frequently touched every day. This may include:  Counters and tables.  Doorknobs and light switches.  Sinks and faucets.  Electronics, such as phones, remote controls, keyboards, computers, and tablets. To protect others: If you have symptoms of COVID-19, take steps to prevent the virus from spreading to others.  If you think you have a COVID-19 infection, contact  your health care provider right away. Tell your health care team that you think you may have a COVID-19 infection.  Stay home. Leave your house only to seek medical care. Do not use public transport.  Do not travel while you are sick.  Wash your hands often with soap and water for 20 seconds. If soap and water are not available, use alcohol-based hand sanitizer.  Stay away from other members of your household. Let healthy household members care for children and pets, if possible. If you have to care for children or pets, wash your hands often and wear a mask. If possible, stay in your own room, separate from others. Use a different bathroom.  Make sure that all people in your household wash their hands well and often.  Cough or sneeze into a tissue or your sleeve or elbow. Do not cough or sneeze into your hand or into the air.  Wear a cloth face covering or face mask. Make sure your mask covers your nose and mouth. Where to find more information  Centers for Disease Control and Prevention: www.cdc.gov/coronavirus/2019-ncov/index.html  World Health Organization: www.who.int/health-topics/coronavirus Contact a health care provider if:  You live in or have traveled to an area where COVID-19 is a risk and you have symptoms of the infection.  You have had contact with someone who has COVID-19 and you have symptoms of the infection. Get help right away if:  You have trouble breathing.  You have pain or pressure in your chest.  You have confusion.  You have bluish lips and fingernails.  You have difficulty waking from sleep.  You have symptoms that get worse. These symptoms may represent a serious problem that is an emergency. Do not wait to see if the symptoms will go away. Get medical help right away. Call your local emergency services (911 in the [place] Do not drive yourself to the hospital. Let the emergency medical personnel know if you think you have  COVID-19. Summary  COVID-19 is a respiratory infection that is caused by a virus. It is also known as coronavirus disease or novel coronavirus. It can cause serious infections, such as pneumonia, acute respiratory distress syndrome, acute respiratory failure, or sepsis.  The virus that causes COVID-19 is contagious. This means that it can spread from person to person through droplets from breathing, speaking, singing, coughing, or sneezing.  You are more likely to develop a serious illness if you are 50 years of age or older, have a weak immune system, live in a nursing home, or have chronic disease.  There is no medicine to treat COVID-19. Your health care provider will talk with you about ways to treat your symptoms.    Take steps to protect yourself and others from infection. Wash your hands often and disinfect objects and surfaces that are frequently touched every day. Stay away from people who are sick and wear a mask if you are sick. This information is not intended to replace advice given to you by your health care provider. Make sure you discuss any questions you have with your health care provider. Document Revised: 02/25/2019 Document Reviewed: 06/03/2018 Elsevier Patient Education  2020 Elsevier Inc. What types of side effects do monoclonal antibody drugs cause?  Common side effects  In general, the more common side effects caused by monoclonal antibody drugs include: . Allergic reactions, such as hives or itching . Flu-like signs and symptoms, including chills, fatigue, fever, and muscle aches and pains . Nausea, vomiting . Diarrhea . Skin rashes . Low blood pressure   The CDC is recommending patients who receive monoclonal antibody treatments wait at least 90 days before being vaccinated.  Currently, there are no data on the safety and efficacy of mRNA COVID-19 vaccines in persons who received monoclonal antibodies or convalescent plasma as part of COVID-19 treatment. Based  on the estimated half-life of such therapies as well as evidence suggesting that reinfection is uncommon in the 90 days after initial infection, vaccination should be deferred for at least 90 days, as a precautionary measure until additional information becomes available, to avoid interference of the antibody treatment with vaccine-induced immune responses. 

## 2019-06-09 NOTE — Progress Notes (Signed)
  Diagnosis: COVID-19  Physician: Patrick Wright  Procedure: [facility] Med: bamlanivimab infusion - Provided patient with bamlanimivab fact sheet for patients, parents and caregivers prior to infusion.  Complications: No immediate complications noted.  Discharge: Discharged home   Almarosa Bohac C 06/09/2019   

## 2019-06-10 ENCOUNTER — Ambulatory Visit (HOSPITAL_COMMUNITY): Payer: BC Managed Care – PPO

## 2019-08-01 ENCOUNTER — Telehealth: Payer: BC Managed Care – PPO | Admitting: Cardiology

## 2019-08-01 NOTE — Progress Notes (Deleted)
{Choose 1 Note Type (Video or Telephone):[phone]}   The patient was identified using 2 identifiers.  Date:  08/01/2019   ID:  Erika Woods, DOB 1965/11/13, MRN [id number]  Patient Location: Home Provider Location: Home  PCP:  Dois Davenport, MD  Cardiologist:  Verne Carrow, MD  Electrophysiologist:  None   Evaluation Performed:  Follow-Up Visit  Chief Complaint:  ***  History of Present Illness:    Erika Woods is a 54 y.o. female with past medical history of diabetes, sleep apnea on CPAP, prior pulmonary embolism and thoracic aortic aneurysm.  She was admitted to the hospital in 09/2017 with acute left thigh superficial thrombosis and found to have a PE.  She had had a procedure on her knee in 07/2017.  Echo in May 2019 showed EF 65-70%, mild AI, dilated aortic root.  Chest CTA noted 4.0 cm ascending aortic aneurysm.  Repeat chest CTA 11/2017 with resolved PE.  She was readmitted to [facility] 11/2018 with recurrent PE.  She has been on Xarelto.  Echo in 11/2018 showed EF 55-60%, trivial AI, mild aortic root dilatation at 4.2 cm.  She was seen by Dr. Clifton James on 05/12/2019 and was asymptomatic.  Chest CTA done on that day showed stable 4 cm ascending thoracic aortic aneurysm.  The patient {does/does not:200015} have symptoms concerning for COVID-19 infection (fever, chills, cough, or new shortness of breath).    Past Medical History:  Diagnosis Date  . Acute pulmonary embolism ([name]) 11/11/2018  . Acute pulmonary embolism with acute cor pulmonale ([name]) 09/16/2017  . Acute superficial venous thrombosis of left lower extremity   . Diabetes mellitus without complication ([name])   . DM (diabetes mellitus) ([name]) 08/18/2011  . Impaired fasting glucose   . Mood disorder ([name]) 08/18/2011  . Obesity, Class III, BMI 40-49.9 (morbid obesity) ([name]) 09/16/2017  . Obstructive sleep apnea on CPAP   . Osteoarthritis of knee   . Personal history of pulmonary embolism   . PTSD (post-traumatic stress  disorder) 08/18/2011  . Pulmonary emboli ([name]) 09/15/2017  . Renal disorder    kideny stones  . Restless leg syndrome   . Severe recurrent major depression without psychotic features ([name]) 02/17/2018  . Sleep apnea 08/18/2011  . Thoracic aortic aneurysm ([name])   . Thoracic aortic aneurysm without rupture [facility])    Past Surgical History:  Procedure Laterality Date  . arm surgery Bilateral Skin removal  . DILATATION & CURETTAGE/HYSTEROSCOPY WITH MYOSURE N/A 06/11/2018   Procedure: DILATATION & CURETTAGE/HYSTEROSCOPY WITH MYOSURE RESECTION OF ENDOMETRIAL THICKENING;  Surgeon: Richardean Chimera, MD;  Location: [facility];  Service: Gynecology;  Laterality: N/A;  BMI 56  . DILATION AND EVACUATION    . ESOPHAGOGASTRODUODENOSCOPY (EGD) WITH PROPOFOL N/A 07/15/2018   Procedure: ESOPHAGOGASTRODUODENOSCOPY (EGD) WITH PROPOFOL;  Surgeon: Willis Modena, MD;  Location: [facility];  Service: Endoscopy;  Laterality: N/A;  . ESOPHAGOGASTRODUODENOSCOPY (EGD) WITH PROPOFOL N/A 04/27/2019   Procedure: ESOPHAGOGASTRODUODENOSCOPY (EGD) WITH PROPOFOL;  Surgeon: Willis Modena, MD;  Location: [facility];  Service: Endoscopy;  Laterality: N/A;  . KIDNEY STONE SURGERY    . KNEE SURGERY Right   . TONSILLECTOMY       No outpatient medications have been marked as taking for the 08/01/19 encounter (Appointment) with Berton Bon, NP.     Allergies:   Sulfa antibiotics   Social History   Tobacco Use  . Smoking status: Never Smoker  . Smokeless tobacco: Never Used  Substance Use Topics  . Alcohol use: Yes  Comment: "wine once or twice a month"  . Drug use: No     Family Hx: The patient's family history is not on file. She was adopted.  ROS:   Please see the history of present illness.    *** All other systems reviewed and are negative.   Prior CV studies:   The following studies were reviewed today:  Chest CTA 05/12/2019 IMPRESSION: Stable 4 cm ascending thoracic aorta aneurysm.  No new  findings.  Echo July 2020: 1. The left ventricle has normal systolic function, with an ejection fraction of 55-60%. The cavity size was normal. Left ventricular diastolic Doppler parameters are consistent with impaired relaxation. No evidence of left ventricular regional wall  motion abnormalities. 2. The right ventricle has normal systolic function. The cavity was normal. There is no increase in right ventricular wall thickness. 3. The mitral valve is grossly normal. 4. The aortic valve is tricuspid. Mild sclerosis of the aortic valve. Aortic valve regurgitation is trivial by color flow Doppler. No stenosis of the aortic valve. 5. There is mild dilatation of the ascending aorta measuring 42 mm.   Labs/Other Tests and Data Reviewed:    EKG:  {EKG/Telemetry Strips Reviewed:[phone]}  Recent Labs: 11/11/2018: B Natriuretic Peptide 70.1; Magnesium 2.0 05/12/2019: ALT 24; BUN 17; Creatinine, Ser 1.00; Hemoglobin 12.4; Platelets 239; Potassium 4.1; Sodium 140   Recent Lipid Panel Lab Results  Component Value Date/Time   CHOL 169 02/18/2018 06:23 AM   TRIG 113 02/18/2018 06:23 AM   HDL 43 02/18/2018 06:23 AM   CHOLHDL 3.9 02/18/2018 06:23 AM   LDLCALC 103 (H) 02/18/2018 06:23 AM    Wt Readings from Last 3 Encounters:  05/12/19 (!) 320 lb 6.4 oz (145.3 kg)  04/27/19 (!) 315 lb 4.1 oz (143 kg)  11/11/18 (!) 320 lb (145.2 kg)     Objective:    Vital Signs:  LMP 01/29/2013    {[facility] Virtual Exam (Optional):[phone]::"VITAL SIGNS:  reviewed"}  ASSESSMENT & PLAN:    Ascending thoracic aortic aneurysm -Stable 4 cm by CTA 05/12/2019.  Has been stable 4.0-4.2 cm over the last 2 years. -Repeat scan in 1 year, 04/2020.   Aortic insufficiency -Trivial by echo in 11/2018  History of PE -Patient with DVT and PE after an intervention on her leg.  Continues on Xarelto.  Diabetes type 2 -On Metformin per PCP.  A1c 6.1%.  Obesity -There is no height or weight on file to  calculate BMI. -Patient should ideally lose weight    COVID-19 Education: The signs and symptoms of COVID-19 were discussed with the patient and how to seek care for testing (follow up with PCP or arrange E-visit).  ***The importance of social distancing was discussed today.  Time:   Today, I have spent *** minutes with the patient with telehealth technology discussing the above problems.     Medication Adjustments/Labs and Tests Ordered: Current medicines are reviewed at length with the patient today.  Concerns regarding medicines are outlined above.   Tests Ordered: No orders of the defined types were placed in this encounter.   Medication Changes: No orders of the defined types were placed in this encounter.   Follow Up:  {F/U Format:[phone]} {follow up:15908}  Signed, Daune Perch, NP  08/01/2019 5:46 AM    [facility]

## 2019-08-09 ENCOUNTER — Encounter: Payer: Self-pay | Admitting: Physician Assistant

## 2019-08-09 ENCOUNTER — Ambulatory Visit: Payer: BC Managed Care – PPO | Admitting: Physician Assistant

## 2019-08-09 ENCOUNTER — Other Ambulatory Visit: Payer: Self-pay

## 2019-08-09 DIAGNOSIS — Z86711 Personal history of pulmonary embolism: Secondary | ICD-10-CM | POA: Diagnosis not present

## 2019-08-09 DIAGNOSIS — I712 Thoracic aortic aneurysm, without rupture, unspecified: Secondary | ICD-10-CM

## 2019-08-09 DIAGNOSIS — R0602 Shortness of breath: Secondary | ICD-10-CM | POA: Diagnosis not present

## 2019-08-09 MED ORDER — METOPROLOL TARTRATE 100 MG PO TABS: ORAL_TABLET | ORAL | 0 refills | Status: DC

## 2019-08-09 NOTE — Progress Notes (Signed)
Cardiology Office Note:    Date:  08/09/2019   ID:  Erika Woods, DOB 1966/01/03, MRN [id number]  PCP:  [name], MD  Cardiologist:  Lauree Chandler, MD   Electrophysiologist:  None   Referring MD: [name], MD   Chief Complaint:  Shortness of Breath    Patient Profile:    [name] is a 54 y.o. female with:   Aortic insufficiency  Echocardiogram 11/2018: trivial AI, EF 55-60, Gr 1 DD  Diabetes mellitus   OSA on CPAP   Hx of pulmonary embolism 5.2019. 7.2020  Thoracic aortic aneurysm   CT 04/2019: 4 cm   CT 07/2018: 4.2 cm   Prior CV studies: Chest CTA 05/12/2019 4 cm asc thoracic aortic aneurysm   Echocardiogram 11/12/2018 EF 55-60, Gr 1 DD, normal RVSF, trivial AI, asc aorta 42 mm  Echocardiogram 02/03/18 EF 55-60, no RWMA, mild AI, Asc aorta 42 mm, mild LAE, trivial TR, mild PI   History of Present Illness:    [name] was last seen by Dr. Angelena Form in 04/2019.    She was recently seen by primary care for shortness of breath and referred back for further evaluation.  Her PCP obtain a D-dimer which was 0.35 (-).  She also obtain a pro BNP which was insignificantly elevated at 133.  She was placed on low-dose furosemide 20 mg daily with minimal improvement in her symptoms.  She notes shortness of breath with just minimal activity.  She denies shortness of breath at rest.  She notes her heart rate increases significant with activity.  She has not had chest discomfort.  She sleeps on an incline chronically without significant change.   She has noted lower extremity swelling.  She has had minimal improvement with Lasix.  She has not had syncope.  She has not had melena, hematochezia, hematuria.  Of note, she had COVID-19 in January 2021.  Past Medical History:  Diagnosis Date  . Acute pulmonary embolism ([place]) 11/11/2018  . Acute pulmonary embolism with acute cor pulmonale ([place]) 09/16/2017  . Acute superficial venous thrombosis of left lower  extremity   . Diabetes mellitus without complication ([place])   . DM (diabetes mellitus) ([place]) 08/18/2011  . Impaired fasting glucose   . Mood disorder ([place]) 08/18/2011  . Obesity, Class III, BMI 40-49.9 (morbid obesity) ([place]) 09/16/2017  . Obstructive sleep apnea on CPAP   . Osteoarthritis of knee   . Personal history of pulmonary embolism   . PTSD (post-traumatic stress disorder) 08/18/2011  . Pulmonary emboli ([place]) 09/15/2017  . Renal disorder    kideny stones  . Restless leg syndrome   . Severe recurrent major depression without psychotic features ([place]rika [place]) 02/17/2018  . Sleep apnea 08/18/2011  . Thoracic aortic aneurysm ([place])   . Thoracic aortic aneurysm without rupture ([name])     Current Medications: Current Meds  Medication Sig  . albuterol (PROVENTIL HFA;VENTOLIN HFA) 108 (90 Base) MCG/ACT inhaler Inhale 1-2 puffs into the lungs every 6 (six) hours as needed for wheezing or shortness of breath.  . fluticasone (FLONASE) 50 MCG/ACT nasal spray Place 1 spray into both nostrils 2 (two) times daily.  . furosemide (LASIX) 20 MG tablet Take 20 mg by mouth every other day.  . lamoTRIgine (LAMICTAL) 150 MG tablet Take 150 mg by mouth daily after breakfast.   . lansoprazole (PREVACID) 15 MG capsule Take 15 mg by mouth daily as needed (acid reflux).  . metFORMIN (GLUCOPHAGE) 500 MG tablet Take 500  mg by mouth 2 (two) times daily with a meal.   . oxymetazoline (AFRIN) 0.05 % nasal spray Place 1 spray into both nostrils at bedtime.  . rivaroxaban (XARELTO) 20 MG TABS tablet Take 20 mg by mouth daily after breakfast.   . rOPINIRole (REQUIP XL) 4 MG 24 hr tablet Take 4-12 mg by mouth at bedtime as needed (restless leg).   . traZODone (DESYREL) 100 MG tablet Take 200-300 mg by mouth at bedtime as needed for sleep.   . TRINTELLIX 10 MG TABS tablet Take 10 mg by mouth daily.      Allergies:   Sulfa antibiotics   Social History   Tobacco Use  . Smoking status: Never Smoker  . Smokeless tobacco: Never Used    Substance Use Topics  . Alcohol use: Yes    Comment: "wine once or twice a month"  . Drug use: No     Family Hx: The patient's family history is not on file. She was adopted.  ROS   EKGs/Labs/Other Test Reviewed:    EKG:  EKG is   ordered today.  The ekg ordered today demonstrates normal sinus rhythm, heart rate 85, LAFB, RBBB, QTC 476, no changes  Recent Labs: 11/11/2018: B Natriuretic Peptide 70.1; Magnesium 2.0 05/12/2019: ALT 24; BUN 17; Creatinine, Ser 1.00; Hemoglobin 12.4; Platelets 239; Potassium 4.1; Sodium 140   Recent Lipid Panel Lab Results  Component Value Date/Time   CHOL 169 02/18/2018 06:23 AM   TRIG 113 02/18/2018 06:23 AM   HDL 43 02/18/2018 06:23 AM   CHOLHDL 3.9 02/18/2018 06:23 AM   LDLCALC 103 (H) 02/18/2018 06:23 AM    Physical Exam:    VS:  BP 106/76   Pulse 85   Ht 5\' 5"  (1.651 m)   Wt (!) 326 lb 12.8 oz (148.2 kg)   LMP 01/29/2013   SpO2 98%   BMI 54.38 kg/m     Wt Readings from Last 3 Encounters:  08/09/19 (!) 326 lb 12.8 oz (148.2 kg)  05/12/19 (!) 320 lb 6.4 oz (145.3 kg)  04/27/19 (!) 315 lb 4.1 oz (143 kg)     Constitutional:      Appearance: Healthy appearance. Not in distress.  Neck:     Vascular: JVD normal.  Pulmonary:     Effort: Pulmonary effort is normal.     Breath sounds: No wheezing. No rales.  Cardiovascular:     Normal rate. Regular rhythm. Normal S1. Normal S2.     Murmurs: There is no murmur.  Edema:    Pretibial: bilateral trace edema of the pretibial area. Abdominal:     Palpations: Abdomen is soft. There is no hepatomegaly.  Skin:    General: Skin is warm and dry.  Neurological:     General: No focal deficit present.     Mental Status: Alert and oriented to person, place and time.       ASSESSMENT & PLAN:    1. Shortness of breath Etiology of her shortness of breath is not entirely clear.  She has had minimal improvement with furosemide.  Her pro BNP was not significant elevated.  She does not  really have clear evidence of volume excess on exam.  However, cannot rule out congestive heart failure.  She would also be at risk for pulmonary hypertension given her prior history of pulmonary emboli.  She did not have any evidence of elevated pulmonary artery pressures on echocardiogram when she was last hospitalized for recurrent pulmonary embolism in July  2020.  She also has diabetes and is at risk for coronary artery disease.  She does not know her family history as she is adopted.  I have recommended that we rule out anemia, pulmonary hypertension and ischemic heart disease.  -Obtain BMET, CBC  -Obtain 2D echocardiogram  -Obtain coronary CTA with FFR  -Follow-up in 4 weeks with Dr. Clifton James  2. Thoracic aortic aneurysm without rupture ([name]) Aneurysm by CT in December 2020 measured 4 cm.  3. History of pulmonary embolism She is on lifelong anticoagulation with Rivaroxaban.  Obtain follow-up BMP, CBC today.   Dispo:  Return in about 4 weeks (around 09/06/2019) for Follow up after testing with Dr. Clifton James, in person.   Medication Adjustments/Labs and Tests Ordered: Current medicines are reviewed at length with the patient today.  Concerns regarding medicines are outlined above.  Tests Ordered: Orders Placed This Encounter  Procedures  . CT CORONARY MORPH W/CTA COR W/SCORE W/CA W/CM &/OR WO/CM  . CT CORONARY FRACTIONAL FLOW RESERVE DATA PREP  . CT CORONARY FRACTIONAL FLOW RESERVE FLUID ANALYSIS  . CBC  . Basic metabolic panel  . EKG 12-Lead  . ECHOCARDIOGRAM COMPLETE   Medication Changes: Meds ordered this encounter  Medications  . metoprolol tartrate (LOPRESSOR) 100 MG tablet    Sig: Take 1 tablet by mouth 2 hours prior to CT scan    Dispense:  1 tablet    Refill:  0    Signed, Tereso Newcomer, PA-C  08/09/2019 2:43 PM    [facility] [address] [place], [place], [place]  [address] Phone: [phone]; Fax: [phone]

## 2019-08-09 NOTE — Patient Instructions (Addendum)
Medication Instructions:   Your physician recommends that you continue on your current medications as directed. Please refer to the Current Medication list given to you today.  *If you need a refill on your cardiac medications before your next appointment, please call your pharmacy*  Lab Work:  You will have labs drawn today: BMET/CBC  If you have labs (blood work) drawn today and your tests are completely normal, you will receive your results only by: Marland Kitchen MyChart Message (if you have MyChart) OR . A paper copy in the mail If you have any lab test that is abnormal or we need to change your treatment, we will call you to review the results.  Testing/Procedures:  Your physician has requested that you have an echocardiogram. Echocardiography is a painless test that uses sound waves to create images of your heart. It provides your doctor with information about the size and shape of your heart and how well your heart's chambers and valves are working. This procedure takes approximately one hour. There are no restrictions for this procedure.  Your cardiac CT will be scheduled at:   [facility] [address] [place], [place] [address] [phone]   When scheduled at [facility], please arrive at the [facility] main entrance of [facility] 30 minutes prior to test start time. Proceed to the [facility] Radiology Department (first floor) to check-in and test prep.  Please follow these instructions carefully (unless otherwise directed):  On the Night Before the Test: . Be sure to Drink plenty of water. . Do not consume any caffeinated/decaffeinated beverages or chocolate 12 hours prior to your test. . Do not take any antihistamines 12 hours prior to your test. . If you take Metformin do not take 24 hours prior to test.  On the Day of the Test: . Drink plenty of water. Do not drink any water within one hour of the test. . Do not eat any food 4 hours prior to  the test. . You may take your regular medications prior to the test.  . Take metoprolol (Lopressor) two hours prior to test. . HOLD Furosemide morning of the test. . FEMALES- please wear underwire-free bra if available       After the Test: . Drink plenty of water. . After receiving IV contrast, you may experience a mild flushed feeling. This is normal. . On occasion, you may experience a mild rash up to 24 hours after the test. This is not dangerous. If this occurs, you can take Benadryl 25 mg and increase your fluid intake. . If you experience trouble breathing, this can be serious. If it is severe call 911 IMMEDIATELY. If it is mild, please call our office. . If you take any of these medications: Glipizide/Metformin, Avandament, Glucavance, please do not take 48 hours after completing test unless otherwise instructed.   Once we have confirmed authorization from your insurance company, we will call you to set up a date and time for your test.   For non-scheduling related questions, please contact the cardiac imaging nurse navigator should you have any questions/concerns: Rockwell Alexandria, RN Navigator Cardiac Imaging [facility] [phone] office  For scheduling needs, including cancellations and rescheduling, please call [phone].     Follow-Up: At [facility], you and your health needs are our priority.  As part of our continuing mission to provide you with exceptional heart care, we have created designated Provider Care Teams.  These Care Teams  include your primary Cardiologist (physician) and Advanced Practice Providers (APPs -  Physician Assistants and Nurse Practitioners) who all work together to provide you with the care you need, when you need it.  We recommend signing up for the patient portal called "MyChart".  Sign up information is provided on this After Visit Summary.  MyChart is used to connect with patients for Virtual Visits  (Telemedicine).  Patients are able to view lab/test results, encounter notes, upcoming appointments, etc.  Non-urgent messages can be sent to your provider as well.   To learn more about what you can do with MyChart, go to [email or web address].    Your next appointment:   4 week(s)  The format for your next appointment:   In Person  Provider:   Lauree Chandler, MD

## 2019-08-10 LAB — BASIC METABOLIC PANEL
BUN/Creatinine Ratio: 12 (ref 9–23)
BUN: 13 mg/dL (ref 6–24)
CO2: 25 mmol/L (ref 20–29)
Calcium: 8.9 mg/dL (ref 8.7–10.2)
Chloride: 105 mmol/L (ref 96–106)
Creatinine, Ser: 1.06 mg/dL — ABNORMAL HIGH (ref 0.57–1.00)
GFR calc Af Amer: 68 mL/min/{1.73_m2} (ref >59)
GFR calc non Af Amer: 59 mL/min/{1.73_m2} — ABNORMAL LOW (ref >59)
Glucose: 107 mg/dL — ABNORMAL HIGH (ref 65–99)
Potassium: 4.3 mmol/L (ref 3.5–5.2)
Sodium: 142 mmol/L (ref 134–144)

## 2019-08-10 LAB — CBC
Hematocrit: 37.8 % (ref 34.0–46.6)
Hemoglobin: 12.4 g/dL (ref 11.1–15.9)
MCH: 28.2 pg (ref 26.6–33.0)
MCHC: 32.8 g/dL (ref 31.5–35.7)
MCV: 86 fL (ref 79–97)
Platelets: 292 10*3/uL (ref 150–450)
RBC: 4.4 x10E6/uL (ref 3.77–5.28)
RDW: 14.1 % (ref 11.7–15.4)
WBC: 7.7 10*3/uL (ref 3.4–10.8)

## 2019-08-11 ENCOUNTER — Other Ambulatory Visit: Payer: Self-pay

## 2019-08-11 ENCOUNTER — Ambulatory Visit (HOSPITAL_COMMUNITY): Payer: BC Managed Care – PPO | Attending: Internal Medicine

## 2019-08-11 DIAGNOSIS — R0602 Shortness of breath: Secondary | ICD-10-CM | POA: Insufficient documentation

## 2019-08-11 DIAGNOSIS — Z86711 Personal history of pulmonary embolism: Secondary | ICD-10-CM | POA: Diagnosis present

## 2019-08-11 DIAGNOSIS — I712 Thoracic aortic aneurysm, without rupture, unspecified: Secondary | ICD-10-CM

## 2019-08-12 ENCOUNTER — Encounter: Payer: Self-pay | Admitting: Physician Assistant

## 2019-08-16 ENCOUNTER — Other Ambulatory Visit: Payer: Self-pay

## 2019-08-16 DIAGNOSIS — I712 Thoracic aortic aneurysm, without rupture, unspecified: Secondary | ICD-10-CM

## 2019-08-16 NOTE — Progress Notes (Signed)
Orders in for repeat ECHO in one year.

## 2019-09-05 ENCOUNTER — Telehealth (HOSPITAL_COMMUNITY): Payer: Self-pay | Admitting: Emergency Medicine

## 2019-09-05 NOTE — Telephone Encounter (Signed)
Reaching out to patient to offer assistance regarding upcoming cardiac imaging study; pt verbalizes understanding of appt date/time, parking situation and where to check in, pre-test NPO status and medications ordered, and verified current allergies; name and call back number provided for further questions should they arise Datron Brakebill RN Navigator Cardiac Imaging [facility] [phone] office [phone] cell 

## 2019-09-06 ENCOUNTER — Other Ambulatory Visit: Payer: Self-pay

## 2019-09-06 ENCOUNTER — Telehealth: Payer: Self-pay | Admitting: Physician Assistant

## 2019-09-06 ENCOUNTER — Ambulatory Visit (HOSPITAL_COMMUNITY)
Admission: RE | Admit: 2019-09-06 | Discharge: 2019-09-06 | Disposition: A | Discharge status: Home / Self Care | Payer: BC Managed Care – PPO | Source: Ambulatory Visit | Attending: Physician Assistant | Admitting: Physician Assistant

## 2019-09-06 DIAGNOSIS — I712 Thoracic aortic aneurysm, without rupture, unspecified: Secondary | ICD-10-CM

## 2019-09-06 DIAGNOSIS — Z86711 Personal history of pulmonary embolism: Secondary | ICD-10-CM | POA: Diagnosis not present

## 2019-09-06 DIAGNOSIS — R0602 Shortness of breath: Secondary | ICD-10-CM | POA: Insufficient documentation

## 2019-09-06 MED ORDER — NITROGLYCERIN 0.4 MG SL SUBL
0.8000 mg | SUBLINGUAL_TABLET | Freq: Once | SUBLINGUAL | Status: AC
  Administered 2019-09-06: 0.8 mg via SUBLINGUAL

## 2019-09-06 MED ORDER — NITROGLYCERIN 0.4 MG SL SUBL
SUBLINGUAL_TABLET | SUBLINGUAL | Status: AC
  Filled 2019-09-06: qty 2

## 2019-09-06 MED ORDER — IOHEXOL 350 MG/ML SOLN
100.0000 mL | Freq: Once | INTRAVENOUS | Status: AC | PRN
  Administered 2019-09-06: 100 mL via INTRAVENOUS

## 2019-09-06 NOTE — Progress Notes (Signed)
Pt states she is feeling somewhat better but is still feeling weak and having a ha

## 2019-09-06 NOTE — Telephone Encounter (Signed)
   Ms. Chittum came in today for a cardiac CT.  Prior to the CT, she took metoprolol 100 mg as a one-time dose.  She does not have a history of hypertension and other than Lasix 20 mg every other day, is not on any medication to lower her blood pressure.  Post procedure, she had some Oreos and something to drink.  She was going to go home, and had a near syncopal event.  She got lightheaded and felt very weak.  She was brought back down to the CT area to the nurses station.  Since being down there, she has had 2 cans of soda, not quite finished with the second 1.  She feels much better.  She is not nauseated.  She has not had any chest pain or shortness of breath.  She no longer feels lightheaded or dizzy.  Current blood pressure is 106/70 with a heart rate of 56.  She is in sinus rhythm with PACs.  She is not aware of the PACs.  Situation discussed with the patient and nursing staff.  She lives at home with family.  When she has had enough to drink and is able to get up and go to the bathroom and urinate without symptoms, she should be able to go home.  She was advised that because of the metoprolol, she will need to rest today and let the medication wear off.  If she has any issues, she is to call our office.  Theodore Demark, PA-C 09/06/2019 3:08 PM

## 2019-09-06 NOTE — Progress Notes (Signed)
Radiology waiting room called states pt is back and not feeling well pt brought back from waiting room to IR holding c/o shob, dizziness, ha and states just does not feel well in general.  Placed on spo2, cardiac monitor.

## 2019-09-09 ENCOUNTER — Encounter: Payer: Self-pay | Admitting: Physician Assistant

## 2019-09-12 IMAGING — CT CT ANGIO CHEST
3 of 8 series · 17 of 46 positions shown · IV contrast (ISOVUE)
Comparison: CT angiogram chest September 15, 2017 and chest radiograph
November 10, 2017

CLINICAL DATA: Recent pulmonary embolus. Shortness of breath and
chest pain

EXAM:
CT ANGIOGRAPHY CHEST WITH CONTRAST
TECHNIQUE: Multidetector CT imaging of the chest was performed using the
standard protocol during bolus administration of intravenous
contrast. Multiplanar CT image reconstructions and MIPs were
obtained to evaluate the vascular anatomy.
CONTRAST:  100mL P1DJT2-CAD IOPAMIDOL (P1DJT2-CAD) INJECTION 76%

[Series 5: thins · axial · 0.69mm/px · z∈[+1450,+1660]mm · 10 of 258 slices shown (1 of 2)]
[im 24/258  lung]
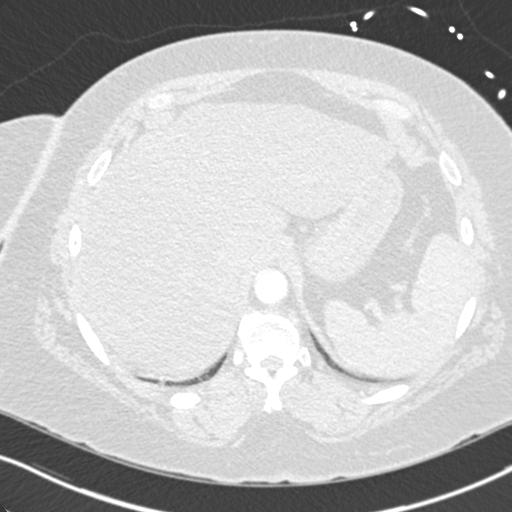
[im 47/258  soft-tissue]
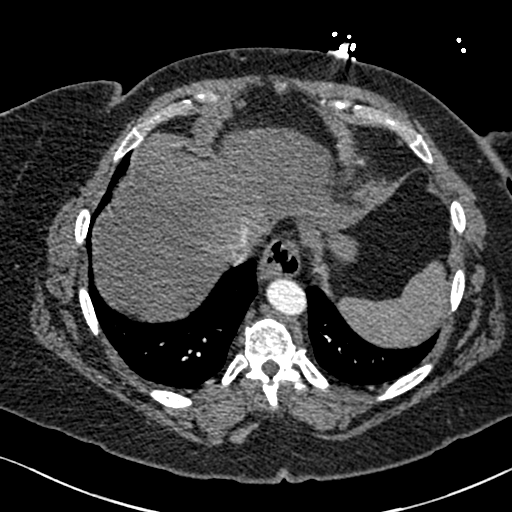
[im 71/258  lung]
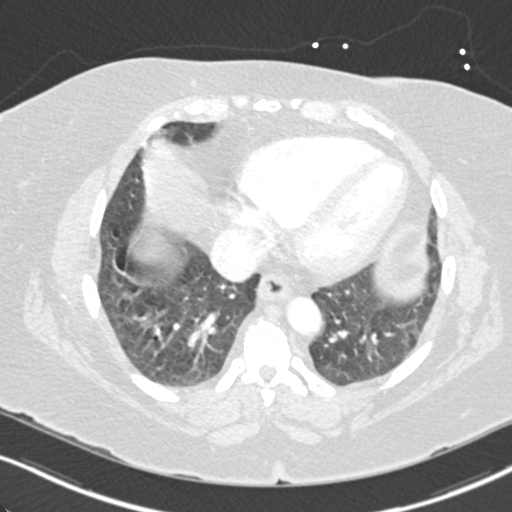
[im 94/258  soft-tissue]
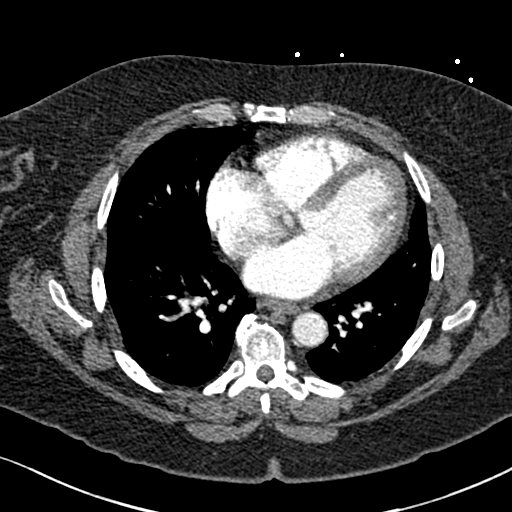
[im 117/258  lung]
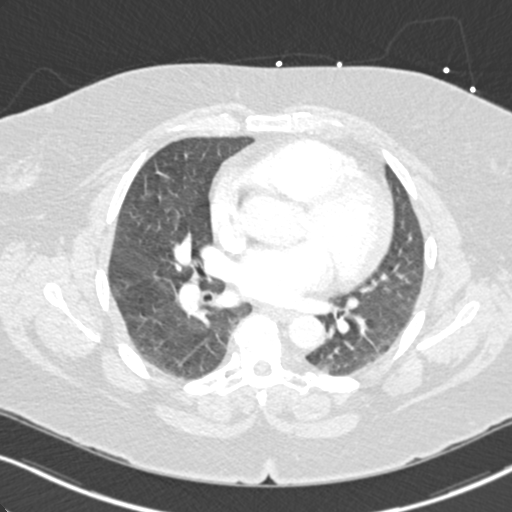
[im 141/258  soft-tissue]
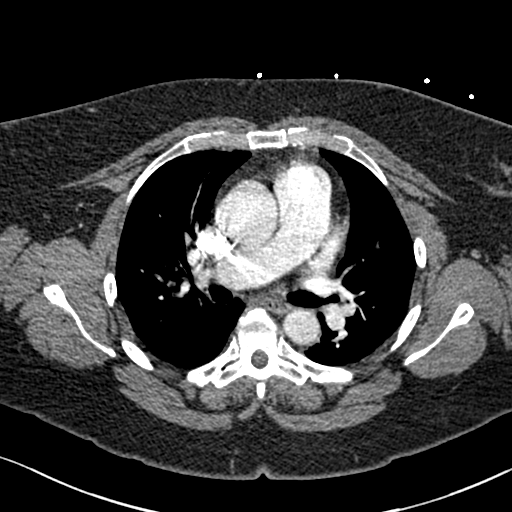
[im 164/258  lung]
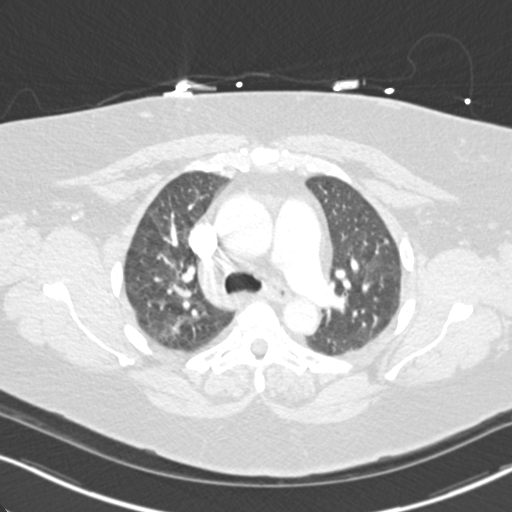
[im 187/258  soft-tissue]
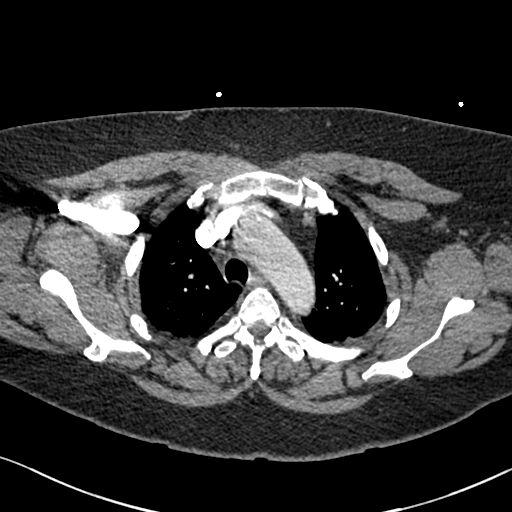
[im 211/258  lung]
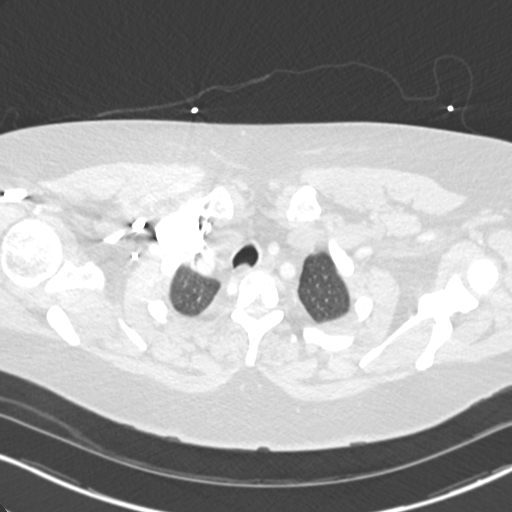
[im 234/258  soft-tissue]
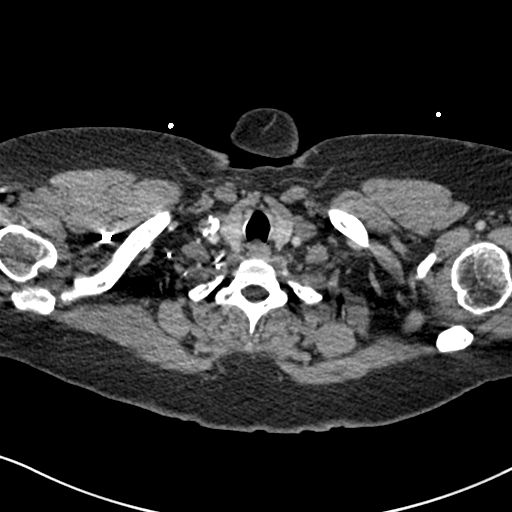

[Series 9: thins · axial · 0.62mm/px · z∈[+1419,+1509]mm · 4 of 249 slices shown (2 of 2)]
[im 23/249  lung]
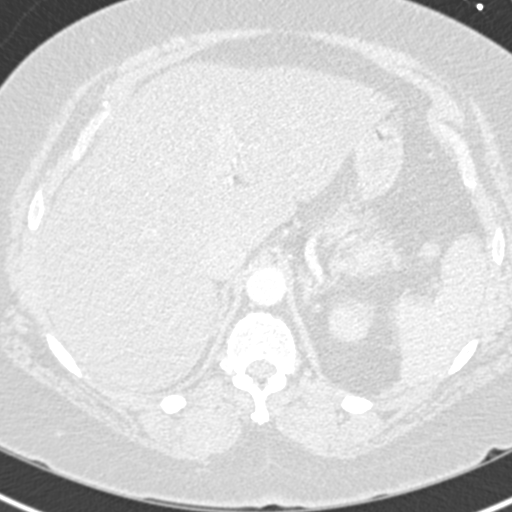
[im 46/249  lung]
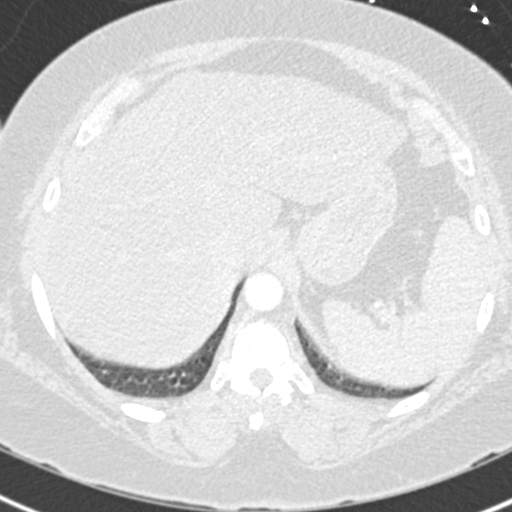
[im 91/249  lung]
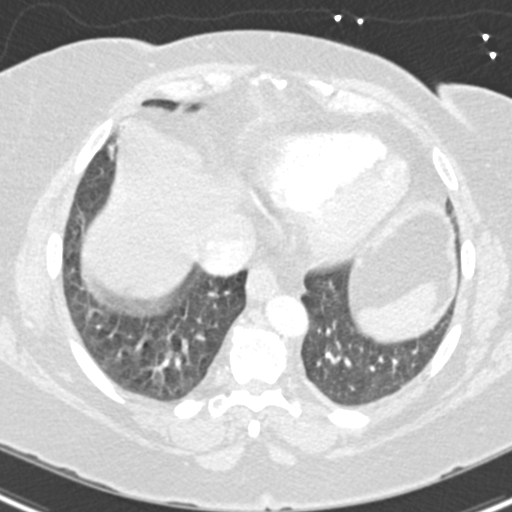
[im 113/249  lung]
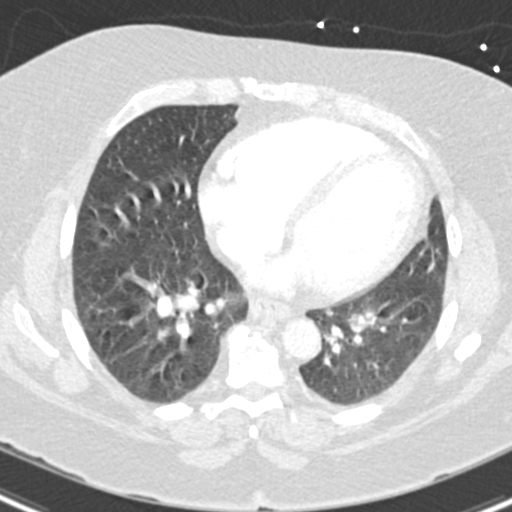

[Series 11: coronal mpr · coronal · 0.52mm/px · 3 of 155 slices shown]
[im 39/155  soft-tissue]
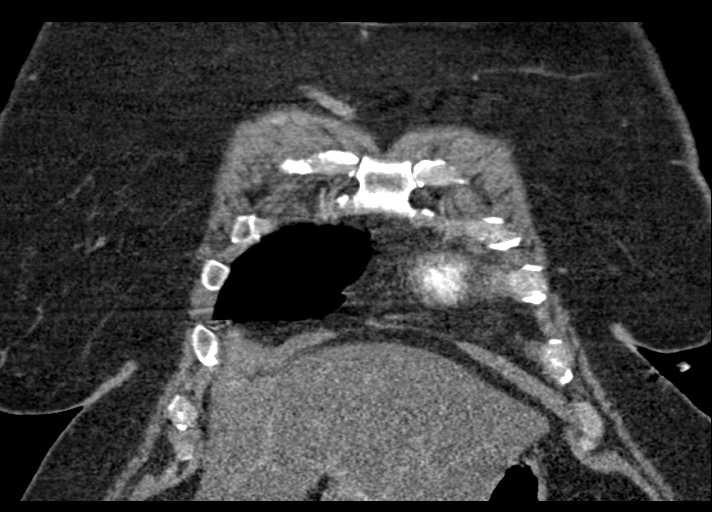
[im 78/155  soft-tissue]
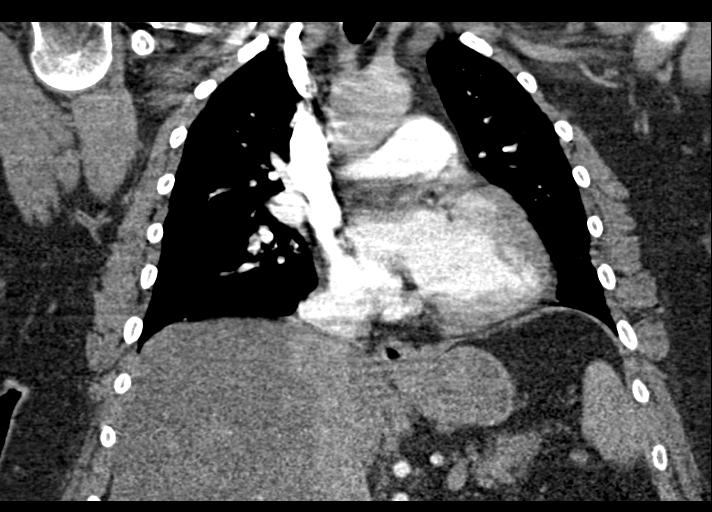
[im 116/155  soft-tissue]
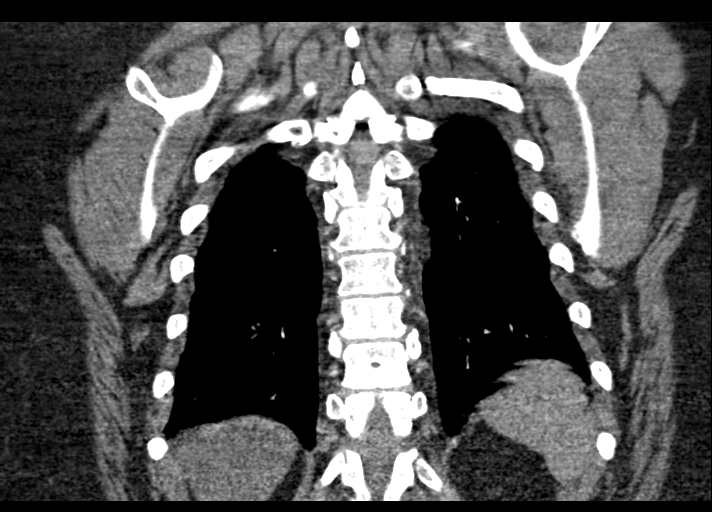

[17 of 46 positions shown; findings below may reference images not displayed]

FINDINGS: Cardiovascular: The previously noted pulmonary emboli have resolved.
On the current examination, there is no evident pulmonary embolus.

Ascending thoracic aortic diameter measures 4.0 x 3.9 cm. There is
no thoracic aortic dissection. Visualized great vessels appear
normal. No pericardial effusion or pericardial thickening is
evident.

The main pulmonary outflow tract has a maximum transverse diameter
3.2 cm, enlarged.

Mediastinum/Nodes: Thyroid appears unremarkable. There is no
appreciable thoracic adenopathy. There is a small hiatal hernia.

Lungs/Pleura: There is no evident edema or consolidation. There are
areas of patchy atelectatic change bilaterally. No pleural effusion
or pleural thickening evident.

Upper Abdomen: There is hepatic steatosis. Visualized upper
abdominal structures otherwise appear normal.

Musculoskeletal: There is degenerative change in the thoracic spine.
No evident blastic or lytic bone lesions. No chest wall lesions
evident. Steatosis. Visualized upper abdominal structures otherwise
appear unremarkable.

Review of the MIP images confirms the above findings.
IMPRESSION: 1. Interval resolution of pulmonary embolus compared to prior study.
Currently no pulmonary embolus evident.

2. Ascending thoracic aorta measures 4.0 x 3.9 cm. No dissection
evident. Recommend annual imaging followup by CTA or MRA. This
recommendation follows 1626
ACCF/AHA/AATS/ACR/ASA/SCA/[place]OCKER/LUIS NASCIMENTO/YEO BOUM/SPIRITUEL Guidelines for the
Diagnosis and Management of Patients with Thoracic Aortic Disease.
Circulation. 1626; 121: [id number].

3. Prominence the main pulmonary outflow tract, a finding most
likely indicative of a degree of pulmonary arterial hypertension.

4.  No lung edema or consolidation.  Patchy atelectasis bilaterally.

5.  No evident thoracic adenopathy.

6.  Small hiatal hernia.

7.  Hepatic steatosis.

## 2019-09-12 IMAGING — CR DG CHEST 2V
2 series · 2 of 2 positions shown · non-contrast
Comparison: 03/19/2009

CLINICAL DATA: Shortness of breath

EXAM:
CHEST - 2 VIEW

[w chest pa]
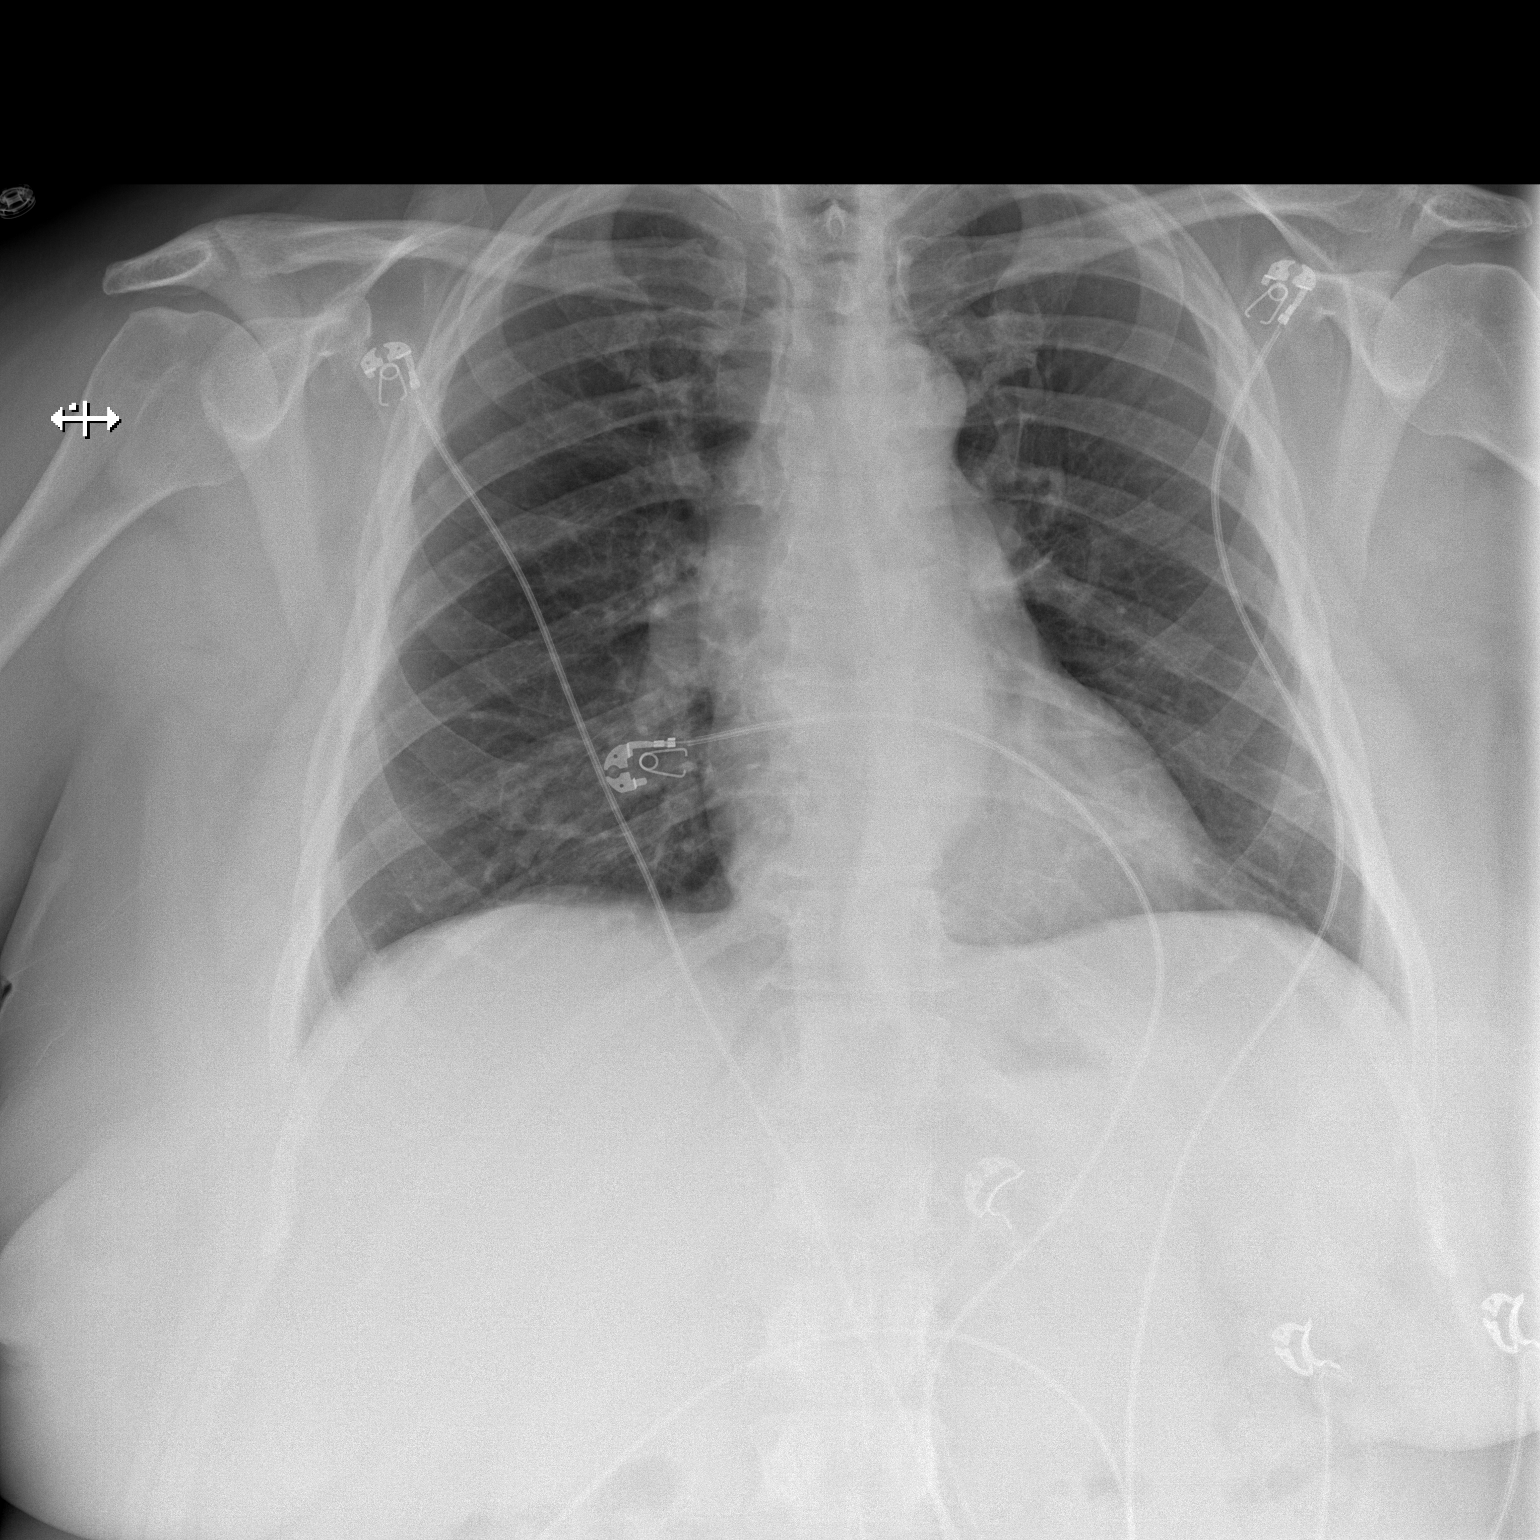

[w chest lat]
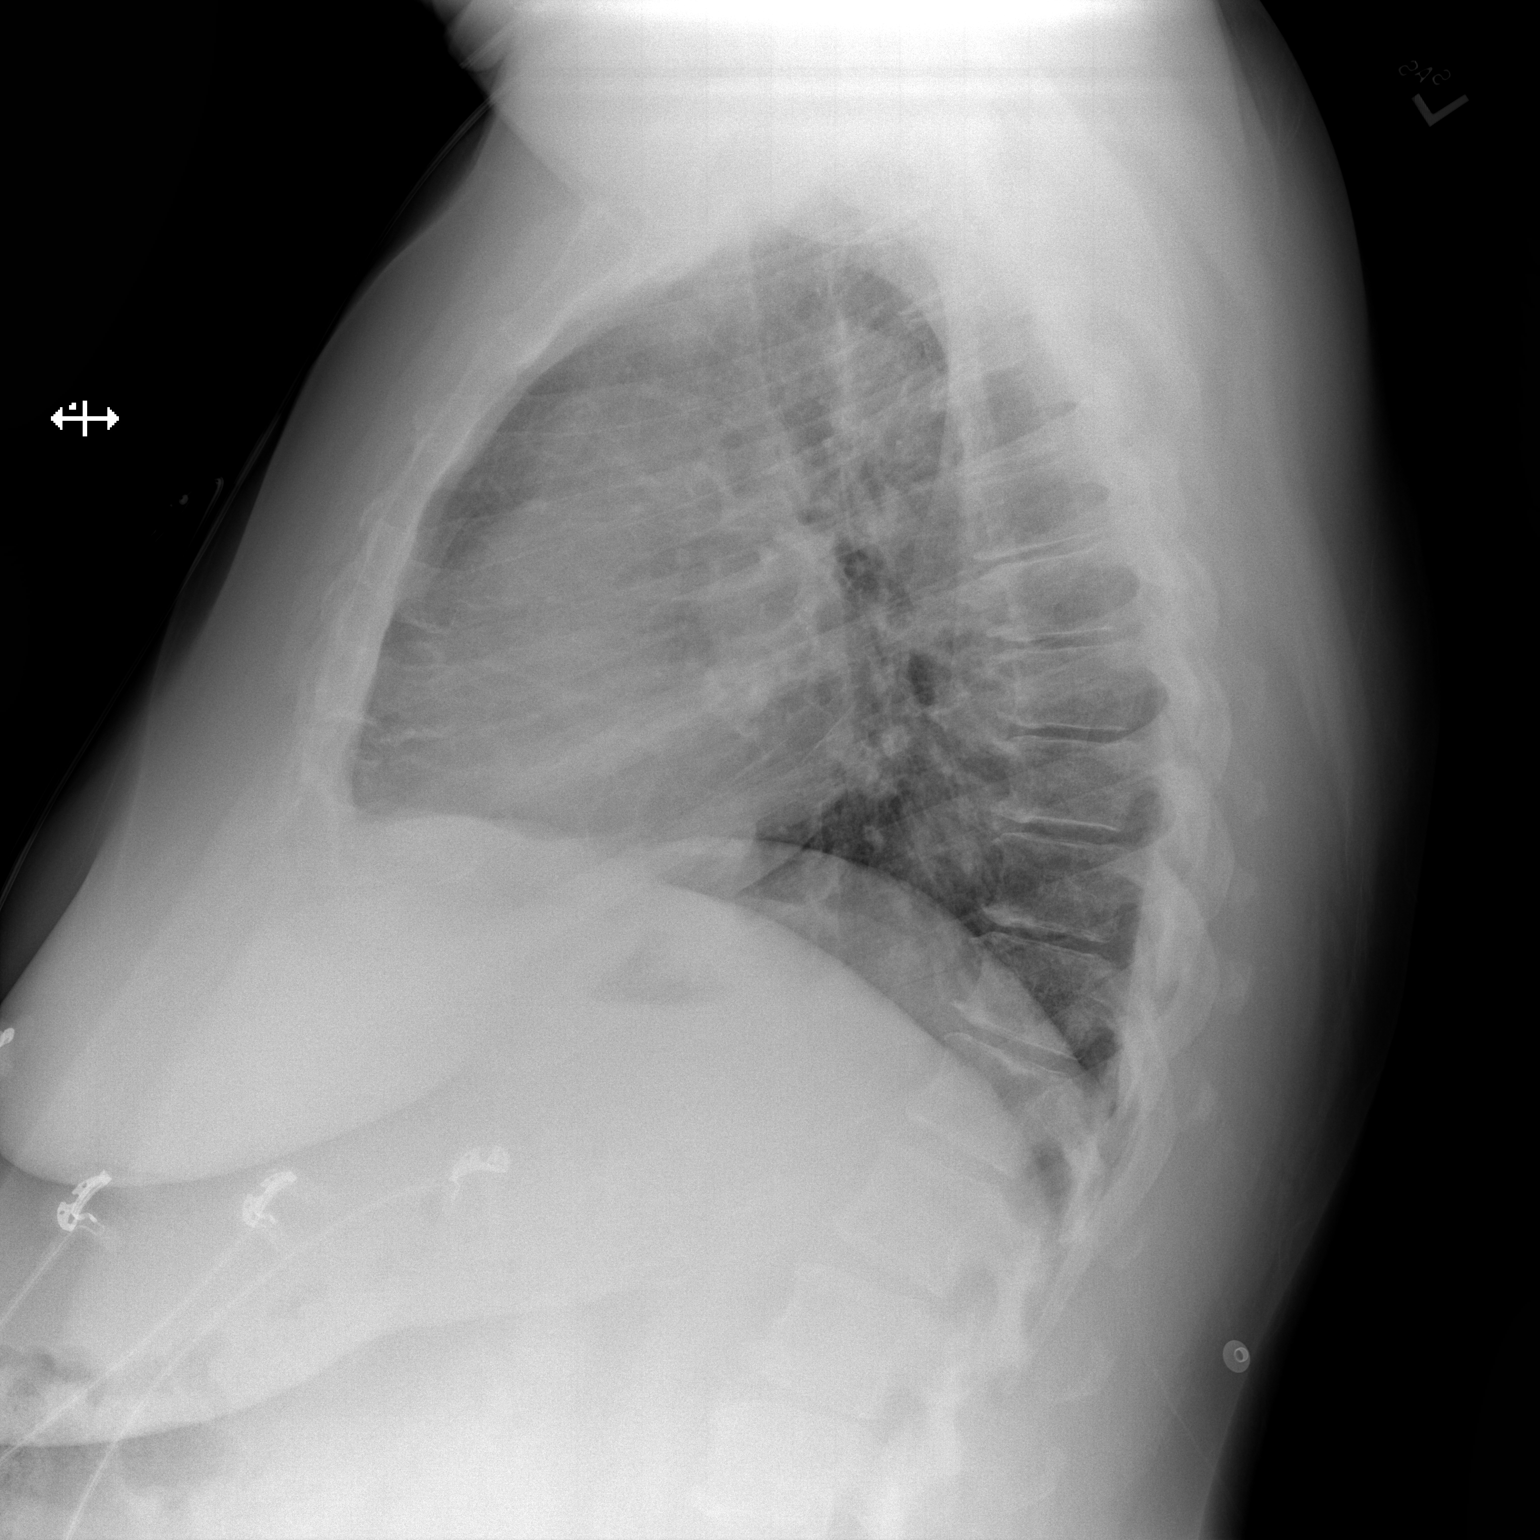

[2 of 2 positions shown; findings below may reference images not displayed]

FINDINGS: The heart size and mediastinal contours are within normal limits.
Both lungs are clear. Mild degenerative changes of the spine.
IMPRESSION: No active cardiopulmonary disease.

## 2019-09-15 ENCOUNTER — Ambulatory Visit: Payer: BC Managed Care – PPO

## 2019-09-15 ENCOUNTER — Ambulatory Visit: Payer: BC Managed Care – PPO | Admitting: Cardiovascular Disease

## 2019-09-15 NOTE — Progress Notes (Deleted)
No chief complaint on file.  History of Present Illness: 54 yo female with hstory of diabetes, sleep apnea on CPAP, prior pulmonary embolism and thoracic aortic aneurysm who is today for cardiac follow up. I saw her in August 2019 for evaluation of thoracic aortic aneurysm. She was admitted to [name] in May 2019 with an acute left thigh superficial thrombosis and found to have a PE. She had a procedure on her knee in March 2019. Echo May 2019 with LVEF=65-70%. Mild AI, dilated aortic root. 4.0 cm ascending aortic aneurysm noted on CTA chest. Repeat chest CTA July 2019 with resolution of PE. She was readmitted to [facility] July 2020 with recurrent PE. She has been on Xarelto. Echo July 2020 with LVEF=55-60%, trivial AI, mild aortic root dilatation at 4.2 cm. She had dyspnea in March 2021. She was not felt to be volume overloaded when she was seen in our office by Tereso Newcomer, PA-C in March 2021. BNP was normal. She had no improvement in dyspnea with low dose Lasix started in primary care.  Echo 08/11/19 with LVEF=55-60%, mild to moderate AI, mild AS with likely bicuspid AV. Coronary CTA April 2021 with calcium score of 0 and no evidence of coronary artery plaque.   She is here today for follow up. The patient denies any chest pain, dyspnea, palpitations, lower extremity edema, orthopnea, PND, dizziness, near syncope or syncope.     Primary Care Physician: Dois Davenport, MD  Past Medical History:  Diagnosis Date  . Acute pulmonary embolism ([name]) 11/11/2018  . Acute pulmonary embolism with acute cor pulmonale ([name]) 09/16/2017  . Acute superficial venous thrombosis of left lower extremity   . Aortic insufficiency    Echocardiogram 08/2019: prob bicuspid AoV, mild to mod AI, mild AS (mean 12 mmHg), EF 55-60, no RWMA, Gr 1 DD, GLS -22.2%, normal RVSF, Ao Root 39 mm  . Coronary CTA    Coronary CTA 08/2019: Calcium score 0, no evidence of CAD, ascending aorta 4 cm  . Diabetes mellitus without complication ([name])     . DM (diabetes mellitus) ([name]) 08/18/2011  . Impaired fasting glucose   . Mood disorder ([name]) 08/18/2011  . Obesity, Class III, BMI 40-49.9 (morbid obesity) ([name]) 09/16/2017  . Obstructive sleep apnea on CPAP   . Osteoarthritis of knee   . Personal history of pulmonary embolism   . PTSD (post-traumatic stress disorder) 08/18/2011  . Pulmonary emboli ([name]) 09/15/2017  . Renal disorder    kideny stones  . Restless leg syndrome   . Severe recurrent major depression without psychotic features ([name]) 02/17/2018  . Sleep apnea 08/18/2011  . Thoracic aortic aneurysm ([name])   . Thoracic aortic aneurysm without rupture [facility])     Past Surgical History:  Procedure Laterality Date  . arm surgery Bilateral Skin removal  . DILATATION & CURETTAGE/HYSTEROSCOPY WITH MYOSURE N/A 06/11/2018   Procedure: DILATATION & CURETTAGE/HYSTEROSCOPY WITH MYOSURE RESECTION OF ENDOMETRIAL THICKENING;  Surgeon: Richardean Chimera, MD;  Location: [facility];  Service: Gynecology;  Laterality: N/A;  BMI 56  . DILATION AND EVACUATION    . ESOPHAGOGASTRODUODENOSCOPY (EGD) WITH PROPOFOL N/A 07/15/2018   Procedure: ESOPHAGOGASTRODUODENOSCOPY (EGD) WITH PROPOFOL;  Surgeon: Willis Modena, MD;  Location: [facility];  Service: Endoscopy;  Laterality: N/A;  . ESOPHAGOGASTRODUODENOSCOPY (EGD) WITH PROPOFOL N/A 04/27/2019   Procedure: ESOPHAGOGASTRODUODENOSCOPY (EGD) WITH PROPOFOL;  Surgeon: Willis Modena, MD;  Location: [facility];  Service: Endoscopy;  Laterality: N/A;  . KIDNEY STONE SURGERY    . KNEE SURGERY Right   .  TONSILLECTOMY      Current Outpatient Medications  Medication Sig Dispense Refill  . albuterol (PROVENTIL HFA;VENTOLIN HFA) 108 (90 Base) MCG/ACT inhaler Inhale 1-2 puffs into the lungs every 6 (six) hours as needed for wheezing or shortness of breath. 1 Inhaler 0  . fluticasone (FLONASE) 50 MCG/ACT nasal spray Place 1 spray into both nostrils 2 (two) times daily.    . furosemide (LASIX) 20 MG tablet Take 20 mg by mouth  every other day.    . lamoTRIgine (LAMICTAL) 150 MG tablet Take 150 mg by mouth daily after breakfast.     . lansoprazole (PREVACID) 15 MG capsule Take 15 mg by mouth daily as needed (acid reflux).    . metFORMIN (GLUCOPHAGE) 500 MG tablet Take 500 mg by mouth 2 (two) times daily with a meal.     . metoprolol tartrate (LOPRESSOR) 100 MG tablet Take 1 tablet by mouth 2 hours prior to CT scan 1 tablet 0  . oxymetazoline (AFRIN) 0.05 % nasal spray Place 1 spray into both nostrils at bedtime.    . rivaroxaban (XARELTO) 20 MG TABS tablet Take 20 mg by mouth daily after breakfast.     . rOPINIRole (REQUIP XL) 4 MG 24 hr tablet Take 4-12 mg by mouth at bedtime as needed (restless leg).     . traMADol (ULTRAM) 50 MG tablet Take 50-100 mg by mouth daily.    . traZODone (DESYREL) 100 MG tablet Take 200-300 mg by mouth at bedtime as needed for sleep.     . TRINTELLIX 10 MG TABS tablet Take 10 mg by mouth daily.     . TRINTELLIX 20 MG TABS tablet Take 20 mg by mouth daily.     No current facility-administered medications for this visit.    Allergies  Allergen Reactions  . Sulfa Antibiotics Hives    Social History   Socioeconomic History  . Marital status: Married    Spouse name: Not on file  . Number of children: 2  . Years of education: Not on file  . Highest education level: Not on file  Occupational History  . Occupation: Administrator  Tobacco Use  . Smoking status: Never Smoker  . Smokeless tobacco: Never Used  Substance and Sexual Activity  . Alcohol use: Yes    Comment: "wine once or twice a month"  . Drug use: No  . Sexual activity: Not on file  Other Topics Concern  . Not on file  Social History Narrative  . Not on file   Social Determinants of Health   Financial Resource Strain:   . Difficulty of Paying Living Expenses:   Food Insecurity:   . Worried About Programme researcher, broadcasting/film/video in the Last Year:   . Barista in the Last Year:   Transportation Needs:   .  Freight forwarder (Medical):   Marland Kitchen Lack of Transportation (Non-Medical):   Physical Activity:   . Days of Exercise per Week:   . Minutes of Exercise per Session:   Stress:   . Feeling of Stress :   Social Connections:   . Frequency of Communication with Friends and Family:   . Frequency of Social Gatherings with Friends and Family:   . Attends Religious Services:   . Active Member of Clubs or Organizations:   . Attends Banker Meetings:   Marland Kitchen Marital Status:   Intimate Partner Violence:   . Fear of Current or Ex-Partner:   . Emotionally Abused:   .  Physically Abused:   . Sexually Abused:     Family History  Adopted: Yes    Review of Systems:  As stated in the HPI and otherwise negative.   LMP 01/29/2013   Physical Examination:  General: Well developed, well nourished, NAD  HEENT: OP clear, mucus membranes moist  SKIN: warm, dry. No rashes. Neuro: No focal deficits  Musculoskeletal: Muscle strength 5/5 all ext  Psychiatric: Mood and affect normal  Neck: No JVD, no carotid bruits, no thyromegaly, no lymphadenopathy.  Lungs:Clear bilaterally, no wheezes, rhonci, crackles Cardiovascular: Regular rate and rhythm. No murmurs, gallops or rubs. Abdomen:Soft. Bowel sounds present. Non-tender.  Extremities: No lower extremity edema. Pulses are 2 + in the bilateral DP/PT.  EKG:  EKG is *** ordered today. The ekg ordered today demonstrates   Echo April 2021:  1. Left ventricular ejection fraction, by estimation, is 55 to 60%. The  left ventricle has normal function. The left ventricle has no regional  wall motion abnormalities. Left ventricular diastolic parameters are  consistent with Grade I diastolic  dysfunction (impaired relaxation). There is Abnormal septal motion  secondary to conduction system disease. The average left ventricular  global longitudinal strain is -22.2 %.  2. Right ventricular systolic function is normal. The right ventricular  size  is normal. Tricuspid regurgitation signal is inadequate for assessing  PA pressure.  3. The mitral valve is normal in structure. No evidence of mitral valve  regurgitation. No evidence of mitral stenosis.  4. Probable forme fruste bicuspid aortic valve. Partial fusion of right  and left coronary cusps. The aortic valve is abnormal. Aortic valve  regurgitation is mild to moderate. Mild aortic valve stenosis. Aortic  valve mean gradient measures 12.0 mmHg.  5. Aortic dilatation noted. There is mild dilatation of the aortic root  measuring 39 mm. Ascending aorta is not well visualized for comparison to  prior exam.  6. The inferior vena cava is normal in size with greater than 50%  respiratory variability, suggesting right atrial pressure of 3 mmHg.   Coronary CTA:  IMPRESSION: 1. Coronary calcium score of 0. This was 0 percentile for age and sex matched control.  2. Normal coronary origin with right dominance.  3. No evidence of CAD.  CAD RADS 0.  4. Consider non-atherosclerotic causes of chest pain.  5. Mildly dilated ascending aorta with greatest diameter 4cm.  Recent Labs: 11/11/2018: B Natriuretic Peptide 70.1; Magnesium 2.0 05/12/2019: ALT 24 08/09/2019: BUN 13; Creatinine, Ser 1.06; Hemoglobin 12.4; Platelets 292; Potassium 4.3; Sodium 142   Lipid Panel    Component Value Date/Time   CHOL 169 02/18/2018 0623   TRIG 113 02/18/2018 0623   HDL 43 02/18/2018 0623   CHOLHDL 3.9 02/18/2018 0623   VLDL 23 02/18/2018 0623   LDLCALC 103 (H) 02/18/2018 0623     Wt Readings from Last 3 Encounters:  08/09/19 (!) 326 lb 12.8 oz (148.2 kg)  05/12/19 (!) 320 lb 6.4 oz (145.3 kg)  04/27/19 (!) 315 lb 4.1 oz (143 kg)     Other studies Reviewed: Additional studies/ records that were reviewed today include:  Review of the above records demonstrates:    Assessment and Plan:   1. Thoracic aortic aneurysm: 4.0-4.2 cm by all scans over the past two years with most recent  April 2021. Repeat CTA April 2022.   2. Bicuspid aortic valve with aortic insufficiency/aortic stenosis: Mild AS and mild to moderate AI by echo April 2021. Repeat echo April 2022.   Current  medicines are reviewed at length with the patient today.  The patient does not have concerns regarding medicines.  The following changes have been made:   Labs/ tests ordered today include:   No orders of the defined types were placed in this encounter.    Disposition:   F/U with me in 12 months   Signed, Lauree Chandler, MD 09/15/2019 6:43 AM    [facility] [place], [place], [place]  [address] Phone: [phone]; Fax: [phone]

## 2019-09-20 ENCOUNTER — Ambulatory Visit: Payer: BC Managed Care – PPO | Attending: Internal Medicine

## 2019-09-20 ENCOUNTER — Other Ambulatory Visit: Payer: Self-pay

## 2019-09-20 DIAGNOSIS — Z23 Encounter for immunization: Secondary | ICD-10-CM

## 2019-09-20 NOTE — Progress Notes (Signed)
   Covid-19 Vaccination Clinic  Name:  GERALENE AFSHAR    MRN: [id number] DOB: 1966/02/28  09/20/2019  Ms. Hy was observed post Covid-19 immunization for 15 minutes without incident. She was provided with Vaccine Information Sheet and instruction to access the V-Safe system.   Ms. Weigel was instructed to call 911 with any severe reactions post vaccine: Marland Kitchen Difficulty breathing  . Swelling of face and throat  . A fast heartbeat  . A bad rash all over body  . Dizziness and weakness   Immunizations Administered    Name Date Dose VIS Date Route   Pfizer COVID-19 Vaccine 09/20/2019  2:00 PM 0.3 mL 07/06/2018 Intramuscular   Manufacturer: [employer], [employer]   Lot: [id number]   NDC: [address]-9

## 2019-09-22 ENCOUNTER — Other Ambulatory Visit: Payer: Self-pay

## 2019-10-11 ENCOUNTER — Ambulatory Visit: Payer: Self-pay

## 2019-10-13 ENCOUNTER — Ambulatory Visit: Payer: BC Managed Care – PPO | Attending: Internal Medicine

## 2019-10-13 ENCOUNTER — Other Ambulatory Visit: Payer: Self-pay | Admitting: Orthopaedic Surgery

## 2019-10-13 DIAGNOSIS — M25561 Pain in right knee: Secondary | ICD-10-CM

## 2019-10-13 DIAGNOSIS — Z23 Encounter for immunization: Secondary | ICD-10-CM

## 2019-10-13 NOTE — Progress Notes (Signed)
   Covid-19 Vaccination Clinic  Name:  Erika Woods    MRN: [id number] DOB: Aug 18, 1965  10/13/2019  Erika Woods was observed post Covid-19 immunization for 15 minutes without incident. She was provided with Vaccine Information Sheet and instruction to access the V-Safe system.   Erika Woods was instructed to call 911 with any severe reactions post vaccine: Marland Kitchen Difficulty breathing  . Swelling of face and throat  . A fast heartbeat  . A bad rash all over body  . Dizziness and weakness   Immunizations Administered    Name Date Dose VIS Date Route   Pfizer COVID-19 Vaccine 10/13/2019 11:25 AM 0.3 mL 07/06/2018 Intramuscular   Manufacturer: [employer], [employer]   Lot: [id number]   NDC: [address]-7

## 2019-10-14 ENCOUNTER — Ambulatory Visit
Admission: RE | Admit: 2019-10-14 | Discharge: 2019-10-14 | Disposition: A | Discharge status: Home / Self Care | Payer: BC Managed Care – PPO | Source: Ambulatory Visit | Attending: Orthopaedic Surgery | Admitting: Orthopaedic Surgery

## 2019-10-14 ENCOUNTER — Other Ambulatory Visit: Payer: Self-pay

## 2019-10-14 DIAGNOSIS — M25561 Pain in right knee: Secondary | ICD-10-CM

## 2019-10-14 HISTORY — PX: IR RADIOLOGIST EVAL & MGMT: IMG5224

## 2019-10-14 NOTE — Consult Note (Signed)
Chief Complaint: Right knee pain  Referring Physician(s): Dalldorf,Peter  History of Present Illness: Erika Woods is a 54 y.o. female with past medical history significant for pulmonary embolism, aortic insufficiency, diabetes, renal stones and sleep apnea who is seen in consultation today for evaluation of right knee pain.  The patient states that she developed the insidious onset of right knee pain in 2018.  She ultimately underwent arthroscopy and 2019 with some improvement in her knee pain.  Since that time, she has undergone several steroid and Synvisc injections though her pain is significantly worsened during the past 6 months and she has now been told that given the advanced degenerative change of her right knee, she will ultimately require a total knee replacement.  Unfortunately, the patient is not currently a candidate for total knee replacement given her obesity with goal of losing 70 pounds.  Patient states her resting knee pain is a 0 / 10 however worsens to 10 / 10 with even very limited activity.  She ambulates with a crutch around the house.  She is currently participating with physical therapy.   Past Medical History:  Diagnosis Date  . Acute pulmonary embolism ([name]) 11/11/2018  . Acute pulmonary embolism with acute cor pulmonale ([name]) 09/16/2017  . Acute superficial venous thrombosis of left lower extremity   . Aortic insufficiency    Echocardiogram 08/2019: prob bicuspid AoV, mild to mod AI, mild AS (mean 12 mmHg), EF 55-60, no RWMA, Gr 1 DD, GLS -22.2%, normal RVSF, Ao Root 39 mm  . Coronary CTA    Coronary CTA 08/2019: Calcium score 0, no evidence of CAD, ascending aorta 4 cm  . Diabetes mellitus without complication ([name])   . DM (diabetes mellitus) ([name]) 08/18/2011  . Impaired fasting glucose   . Mood disorder ([name]) 08/18/2011  . Obesity, Class III, BMI 40-49.9 (morbid obesity) ([name]) 09/16/2017  . Obstructive sleep apnea on CPAP   . Osteoarthritis of knee   .  Personal history of pulmonary embolism   . PTSD (post-traumatic stress disorder) 08/18/2011  . Pulmonary emboli ([name]) 09/15/2017  . Renal disorder    kideny stones  . Restless leg syndrome   . Severe recurrent major depression without psychotic features ([name]) 02/17/2018  . Sleep apnea 08/18/2011  . Thoracic aortic aneurysm ([name])   . Thoracic aortic aneurysm without rupture [facility])     Past Surgical History:  Procedure Laterality Date  . arm surgery Bilateral Skin removal  . DILATATION & CURETTAGE/HYSTEROSCOPY WITH MYOSURE N/A 06/11/2018   Procedure: DILATATION & CURETTAGE/HYSTEROSCOPY WITH MYOSURE RESECTION OF ENDOMETRIAL THICKENING;  Surgeon: Richardean Chimera, MD;  Location: [facility];  Service: Gynecology;  Laterality: N/A;  BMI 56  . DILATION AND EVACUATION    . ESOPHAGOGASTRODUODENOSCOPY (EGD) WITH PROPOFOL N/A 07/15/2018   Procedure: ESOPHAGOGASTRODUODENOSCOPY (EGD) WITH PROPOFOL;  Surgeon: Willis Modena, MD;  Location: [facility];  Service: Endoscopy;  Laterality: N/A;  . ESOPHAGOGASTRODUODENOSCOPY (EGD) WITH PROPOFOL N/A 04/27/2019   Procedure: ESOPHAGOGASTRODUODENOSCOPY (EGD) WITH PROPOFOL;  Surgeon: Willis Modena, MD;  Location: [facility];  Service: Endoscopy;  Laterality: N/A;  . KIDNEY STONE SURGERY    . KNEE SURGERY Right   . TONSILLECTOMY      Allergies: Sulfa antibiotics  Medications: Prior to Admission medications   Medication Sig Start Date End Date Taking? Authorizing Provider  albuterol (PROVENTIL HFA;VENTOLIN HFA) 108 (90 Base) MCG/ACT inhaler Inhale 1-2 puffs into the lungs every 6 (six) hours as needed for wheezing or shortness of breath. 02/06/18  Charlesetta Shanks, MD  fluticasone (FLONASE) 50 MCG/ACT nasal spray Place 1 spray into both nostrils 2 (two) times daily. 06/09/19   [provider]  furosemide (LASIX) 20 MG tablet Take 20 mg by mouth every other day. 07/29/19   [provider]  lamoTRIgine (LAMICTAL) 150 MG tablet Take 150 mg by mouth daily  after breakfast.  06/30/18   [provider]  lansoprazole (PREVACID) 15 MG capsule Take 15 mg by mouth daily as needed (acid reflux).    [provider]  metFORMIN (GLUCOPHAGE) 500 MG tablet Take 500 mg by mouth 2 (two) times daily with a meal.  10/28/13   [provider]  metoprolol tartrate (LOPRESSOR) 100 MG tablet Take 1 tablet by mouth 2 hours prior to CT scan 08/09/19   Richardson Dopp T, PA-C  oxymetazoline (AFRIN) 0.05 % nasal spray Place 1 spray into both nostrils at bedtime.    [provider]  rivaroxaban (XARELTO) 20 MG TABS tablet Take 20 mg by mouth daily after breakfast.     [provider]  rOPINIRole (REQUIP XL) 4 MG 24 hr tablet Take 4-12 mg by mouth at bedtime as needed (restless leg).     [provider]  traMADol (ULTRAM) 50 MG tablet Take 50-100 mg by mouth daily. 07/29/19   [provider]  traZODone (DESYREL) 100 MG tablet Take 200-300 mg by mouth at bedtime as needed for sleep.     [provider]  TRINTELLIX 10 MG TABS tablet Take 10 mg by mouth daily.  10/27/18   [provider]  TRINTELLIX 20 MG TABS tablet Take 20 mg by mouth daily. 05/13/19   [provider]     Family History  Adopted: Yes    Social History   Socioeconomic History  . Marital status: Married    Spouse name: Not on file  . Number of children: 2  . Years of education: Not on file  . Highest education level: Not on file  Occupational History  . Occupation: Aeronautical engineer  Tobacco Use  . Smoking status: Never Smoker  . Smokeless tobacco: Never Used  Substance and Sexual Activity  . Alcohol use: Yes    Comment: "wine once or twice a month"  . Drug use: No  . Sexual activity: Not on file  Other Topics Concern  . Not on file  Social History Narrative  . Not on file   Social Determinants of Health   Financial Resource Strain:   . Difficulty of Paying Living Expenses:   Food Insecurity:   . Worried  About Charity fundraiser in the Last Year:   . Arboriculturist in the Last Year:   Transportation Needs:   . Film/video editor (Medical):   Marland Kitchen Lack of Transportation (Non-Medical):   Physical Activity:   . Days of Exercise per Week:   . Minutes of Exercise per Session:   Stress:   . Feeling of Stress :   Social Connections:   . Frequency of Communication with Friends and Family:   . Frequency of Social Gatherings with Friends and Family:   . Attends Religious Services:   . Active Member of Clubs or Organizations:   . Attends Archivist Meetings:   Marland Kitchen Marital Status:     ECOG Status: 2 - Symptomatic, <50% confined to bed  Review of Systems  Review of Systems: A 12 point ROS discussed and pertinent positives are indicated in the HPI above.  All other systems are negative.  Physical Exam No direct physical exam was performed (except for noted visual exam findings with Video Visits).   Vital Signs: LMP 01/29/2013   Imaging: No results found.  Labs:  CBC: Recent Labs    11/11/18 1857 11/12/18 0704 05/12/19 0210 08/09/19 1557  WBC 9.6 7.8 7.6 7.7  HGB 11.9* 11.5* 12.4 12.4  HCT 37.4 37.9 41.1 37.8  PLT 228 216 239 292    COAGS: No results for input(s): INR, APTT in the last 8760 hours.  BMP: Recent Labs    11/12/18 0704 04/25/19 1340 05/12/19 0210 08/09/19 1557  NA 142 143 140 142  K 3.5 4.6 4.1 4.3  CL 108 102 103 105  CO2 26 29 26 25   GLUCOSE 142* 133* 111* 107*  BUN 18 15 17 13   CALCIUM 8.8* 9.6 9.0 8.9  CREATININE 1.13* 1.17* 1.00 1.06*  GFRNONAA 55* 53* >60 59*  GFRAA >60 61 >60 68    LIVER FUNCTION TESTS: Recent Labs    11/11/18 1857 05/12/19 0210  BILITOT 0.2* 0.7  AST 25 19  ALT 31 24  ALKPHOS 74 70  PROT 6.6 6.7  ALBUMIN 3.9 4.0    TUMOR MARKERS: No results for input(s): AFPTM, CEA, CA199, CHROMGRNA in the last 8760 hours.  Assessment and Plan:  Erika Woods is a 54 y.o. female with past medical history  significant for pulmonary embolism, aortic insufficiency, diabetes, renal stones and sleep apnea who is seen in consultation today for evaluation of right knee pain.  The patient states that she developed the insidious onset of right knee pain in 2018.  She ultimately underwent arthroscopy and 2019 with some improvement in her knee pain.  Since that time, she has undergone several steroid and Synvisc injections though her pain is significantly worsened during the past 6 months and she has now been told that given the advanced degenerative change of her right knee, she will ultimately require a total knee replacement.  Unfortunately, the patient is not currently a candidate for total knee replacement given her obesity with goal of losing 70 pounds.  Patient states her resting knee pain is a 0 out of 10 however worsens to 10 out of 10 with even very limited activity.    I explained that the IOVERA treatment is a new modality that utilizes cyroneurolysis technology to temporarily alter the pain recepting nerves supplying the anterior aspect of the knee, in hopes of achieving a clinically significant reduction in knee pain.     I explained that if successful, the patient will experience a rapid pain reduction which can last for approximately 3 months.  I explained that while their knee pain may be reduced, the structural damage of the knee remains, and thus, this is not a curative technology and their knee pain will return.     Risks associated with the procedure include bleeding/bruising, infection and post procedural paresthesias/weakness.   The procedure is performed as an outpatient basis at [facility]  The procedure is performed soley with local anesthesia, and therefore the patient does need to be NPO, there is no postprocedural recovery and the patient does not need a driver home.  Additionally, the patient does not have to hold any other their medications, including anticoagulation.  On  the day of the procedure, the patient is encouraged to wear either shorts or loose fitting pants that can be rolled up to the upper thigh.     Following this prolonged  and detailed conversation, the patient wishes to pursue IOVERA therapy of the right knee.    As such, pending insurance approval, this procedure would be scheduled at [facility] at the next earliest convenience.  The patient knows to call the interventional radiology clinic with any interval questions or concerns   Thank you for this interesting consult.  I greatly enjoyed meeting Erika Woods and look forward to participating in their care.  A copy of this report was sent to the requesting provider on this date.  Electronically Signed: Simonne Come 10/14/2019, 3:16 PM   I spent a total of 15 Minutes in remote  clinical consultation, greater than 50% of which was counseling/coordinating care for right knee pain.    Visit type: Audio only (telephone). Audio (no video) only due to patient's lack of internet/smartphone capability. Alternative for in-person consultation at [facility], [address] [place], [place], [place]. This visit type was conducted due to national recommendations for restrictions regarding the COVID-19 Pandemic (e.g. social distancing).  This format is felt to be most appropriate for this patient at this time.  All issues noted in this document were discussed and addressed.

## 2019-10-18 ENCOUNTER — Other Ambulatory Visit (HOSPITAL_COMMUNITY): Payer: Self-pay | Admitting: Interventional Radiology

## 2019-10-18 DIAGNOSIS — G8929 Other chronic pain: Secondary | ICD-10-CM

## 2019-10-28 ENCOUNTER — Other Ambulatory Visit: Payer: Self-pay

## 2019-10-28 ENCOUNTER — Ambulatory Visit (HOSPITAL_COMMUNITY)
Admission: RE | Admit: 2019-10-28 | Discharge: 2019-10-28 | Disposition: A | Discharge status: Home / Self Care | Payer: BC Managed Care – PPO | Source: Ambulatory Visit | Attending: Interventional Radiology | Admitting: Interventional Radiology

## 2019-10-28 DIAGNOSIS — G8929 Other chronic pain: Secondary | ICD-10-CM | POA: Insufficient documentation

## 2019-10-28 DIAGNOSIS — M25561 Pain in right knee: Secondary | ICD-10-CM | POA: Insufficient documentation

## 2019-10-28 HISTORY — PX: IR ABLATE LIVER CRYOABLATION: IMG5524

## 2019-10-28 MED ORDER — LIDOCAINE HCL (PF) 1 % IJ SOLN
INTRAMUSCULAR | Status: AC
  Filled 2019-10-28: qty 30

## 2019-10-28 NOTE — Procedures (Signed)
Pre procedural Dx: Right sided knee pain Post procedural Dx: Same  Technically successful US guided cryoneurolysis of the IAFCN; MAFCN and the ISN supplying the right knee.   Pre procedural knee pain: At rest: 3/10 With activity: 8/10  Post procedural knee pain: At rest: - 0/10 With activity: 2/10  Additionally, the patient reported immediate relief of need to limp while walking.  EBL: Trace Complications: None immediate  Katherina Right, MD Pager #: [phone]

## 2019-11-01 ENCOUNTER — Other Ambulatory Visit: Payer: Self-pay | Admitting: Gastroenterology

## 2019-11-01 DIAGNOSIS — K7469 Other cirrhosis of liver: Secondary | ICD-10-CM

## 2019-11-02 ENCOUNTER — Telehealth: Payer: Self-pay | Admitting: *Deleted

## 2019-11-02 NOTE — Telephone Encounter (Signed)
S/w Ms. Metoyer regarding Iovera Therapy of Rt knee. She states it's better but wished it would have completely taken the pain away but she is satisfied. She does not regret having procedure. I advised to call for any future needs.Erika Woods

## 2019-11-08 ENCOUNTER — Telehealth: Payer: Self-pay | Admitting: Interventional Radiology

## 2019-11-08 NOTE — Telephone Encounter (Signed)
Patient called IR clinic today reporting that she "is still having burning,tingling, and stinging. She has put ice, it helps sometimes but not always."  Called pt twice to discuss but was unable to reach her.  Will try again (time permitting) tomorrow.  Katherina Right, MD Pager #: [phone]

## 2019-11-08 NOTE — Telephone Encounter (Signed)
Patient called back and was able to discuss her numbness following IOVERA treatment on 6/18.  Patient's rest pain is improved since undergoing IOVERA Rx of the right knee (currently 0/10; 3/10 pre-procedure), though she states that her pain with activity is unchanged to minimally improved (currently 7/10; 8/10 pre pre-procedure).   Importantly she does NOT feel she is better able to exercise after undergoing IOVERA, a pre procedural goal, in hopes of achieving her weight loss goal to improve her candidacy for TKR.  She states that her anterior knee is numb but that she has a band of "stinging sensation" along the anterior mid thigh.    I explained that this likely represents the demarcation of the ablated cutaneous nerve's regeneration and that I would expect this to migrate inferior as the nerve completes it's regeneration, typically taking @ 3 months.  I am hopeful this will improve with time and otherwise encouraged her continued conservative management.  Ultimately time will tell whether she has had any substantial improvement with the IOVERA therapy, but I am hopeful it will allow her some improved functionality to improve her chances of achieving her weight loss goals.  Patient demonstrates excellent understanding of the above conversation and was very appreciative of the phone call.   She knows to call the IR clinic with any future questions or concerns.  Katherina Right, MD Pager #: [phone]

## 2019-11-09 ENCOUNTER — Other Ambulatory Visit: Payer: BC Managed Care – PPO

## 2019-11-10 ENCOUNTER — Ambulatory Visit
Admission: RE | Admit: 2019-11-10 | Discharge: 2019-11-10 | Disposition: A | Discharge status: Home / Self Care | Payer: BC Managed Care – PPO | Source: Ambulatory Visit | Attending: Gastroenterology | Admitting: Gastroenterology

## 2019-11-10 DIAGNOSIS — K7469 Other cirrhosis of liver: Secondary | ICD-10-CM

## 2020-02-03 ENCOUNTER — Other Ambulatory Visit: Payer: Self-pay | Admitting: Gastroenterology

## 2020-02-03 DIAGNOSIS — K7460 Unspecified cirrhosis of liver: Secondary | ICD-10-CM

## 2020-03-02 ENCOUNTER — Telehealth: Payer: Self-pay | Admitting: *Deleted

## 2020-03-02 DIAGNOSIS — I712 Thoracic aortic aneurysm, without rupture, unspecified: Secondary | ICD-10-CM

## 2020-03-02 NOTE — Telephone Encounter (Signed)
Patient was to have Chest CTA done in December 2021 for one year follow up of thoracic aortic aneurysm.  Patient had cardiac CT done on 09/06/19.  Reviewed with Dr Clifton James and patient does not need repeat chest CTA done in December 2021.  Should be repeated in April/May 2022. New order placed.   Appointment for May 10, 2020 CTA canceled and patient notified of plan for repeat in Aprill/May 2022

## 2020-03-14 ENCOUNTER — Other Ambulatory Visit: Payer: Self-pay | Admitting: Interventional Radiology

## 2020-03-14 DIAGNOSIS — M25561 Pain in right knee: Secondary | ICD-10-CM

## 2020-04-03 ENCOUNTER — Other Ambulatory Visit: Payer: Self-pay

## 2020-04-03 ENCOUNTER — Ambulatory Visit
Admission: RE | Admit: 2020-04-03 | Discharge: 2020-04-03 | Disposition: A | Discharge status: Home / Self Care | Payer: BC Managed Care – PPO | Source: Ambulatory Visit | Attending: Interventional Radiology | Admitting: Interventional Radiology

## 2020-04-03 DIAGNOSIS — M25561 Pain in right knee: Secondary | ICD-10-CM

## 2020-04-03 HISTORY — PX: IR RADIOLOGIST EVAL & MGMT: IMG5224

## 2020-04-03 NOTE — Progress Notes (Signed)
Patient ID: Erika Woods, female   DOB: 1965-10-25, 54 y.o.   MRN: [id number]         Chief Complaint: Post IOVERA Rx of the right knee on 6/18  Referring Physician(s): Dalldorf,Peter  History of Present Illness: Erika Woods is a 54 y.o. female with past medical history significant for pulmonary embolism, aortic insufficiency, diabetes, renal stones and sleep apnea who is seen in consultation today for evaluation of right knee pain on 6/4 and ultimately underwent IOVERA treatment of the right knee on 6/18.   In review, the patient states that she developed the insidious onset of right knee pain in 2018.  She subsequently underwent arthroscopy in 2019 with some improvement in her knee pain.  Since that time, she has undergone several steroid and Synvisc injections though her pain is significantly worsened during the past 6 months and she has now been told that given the advanced degenerative change of her right knee, she will ultimately require a total knee replacement.  Unfortunately, the patient is not currently a candidate for total knee replacement given her obesity with goal of losing 70 pounds. Patient stated her resting knee pain is a 0 / 10 however worsens to 10 / 10 with even very limited activity.  She ambulates with a crutch around the house.    As such, the patient then underwent IOVERA therapy of the right knee on 6/18 with pain scares as follows: Preprocedural knee pain:  At rest: 3/10 With activity: 8/10.  Postprocedural knee pain: At rest: 0/10 With activity: 2/10 (patient reported immediate relief of knee to limp while walking)  Telephone encounter was held with the patient on 6/18 at which time she stated her rest pain was improved since undergoing IOVERA Rx of the right knee (currently 0/10; 3/10 pre-procedure), though she states that her pain with activity is unchanged to minimally improved (currently 7/10; 8/10 pre pre-procedure).  She stated that her anterior knee  is numb but that she has a band of "stinging sensation" along the anterior mid thigh.    Patient is seen today and telemedicine consultation 5 months following the intervention.  Fortunately, patient states that approximately 2 weeks after the IOVERA therapy she noticed a significant improvement in her right knee pain. As such, she was able to initiate an exercise plan and thus far has lost 22 pounds. She states that in August of this year she was able to go on a cruise with her husband and was able to enjoy the cruise without significant knee pain.  As expected, the patient states her pain has slowly returned recently and wishes to undergo repeat IOVERA therapy on the right knee if able.  Currently she rates her right knee pain as follows: At rest - 0/10 Activity - 8/10  Past Medical History:  Diagnosis Date  . Acute pulmonary embolism ([name]) 11/11/2018  . Acute pulmonary embolism with acute cor pulmonale ([name]) 09/16/2017  . Acute superficial venous thrombosis of left lower extremity   . Aortic insufficiency    Echocardiogram 08/2019: prob bicuspid AoV, mild to mod AI, mild AS (mean 12 mmHg), EF 55-60, no RWMA, Gr 1 DD, GLS -22.2%, normal RVSF, Ao Root 39 mm  . Coronary CTA    Coronary CTA 08/2019: Calcium score 0, no evidence of CAD, ascending aorta 4 cm  . Diabetes mellitus without complication ([name])   . DM (diabetes mellitus) ([name]) 08/18/2011  . Impaired fasting glucose   . Mood disorder ([name]) 08/18/2011  .  Obesity, Class III, BMI 40-49.9 (morbid obesity) ([name]) 09/16/2017  . Obstructive sleep apnea on CPAP   . Osteoarthritis of knee   . Personal history of pulmonary embolism   . PTSD (post-traumatic stress disorder) 08/18/2011  . Pulmonary emboli ([name]) 09/15/2017  . Renal disorder    kideny stones  . Restless leg syndrome   . Severe recurrent major depression without psychotic features ([name]) 02/17/2018  . Sleep apnea 08/18/2011  . Thoracic aortic aneurysm ([name])   . Thoracic aortic aneurysm  without rupture [facility]([name])     Past Surgical History:  Procedure Laterality Date  . arm surgery Bilateral Skin removal  . DILATATION & CURETTAGE/HYSTEROSCOPY WITH MYOSURE N/A 06/11/2018   Procedure: DILATATION & CURETTAGE/HYSTEROSCOPY WITH MYOSURE RESECTION OF ENDOMETRIAL THICKENING;  Surgeon: Richardean ChimeraMcComb, Nickol Collister, MD;  Location: [facility];  Service: Gynecology;  Laterality: N/A;  BMI 56  . DILATION AND EVACUATION    . ESOPHAGOGASTRODUODENOSCOPY (EGD) WITH PROPOFOL N/A 07/15/2018   Procedure: ESOPHAGOGASTRODUODENOSCOPY (EGD) WITH PROPOFOL;  Surgeon: [name], [name], MD;  Location: [facility];  Service: Endoscopy;  Laterality: N/A;  . ESOPHAGOGASTRODUODENOSCOPY (EGD) WITH PROPOFOL N/A 04/27/2019   Procedure: ESOPHAGOGASTRODUODENOSCOPY (EGD) WITH PROPOFOL;  Surgeon: [name], [name], MD;  Location: [facility];  Service: Endoscopy;  Laterality: N/A;  . IR ABLATE LIVER CRYOABLATION  10/28/2019  . IR RADIOLOGIST EVAL & MGMT  10/14/2019  . KIDNEY STONE SURGERY    . KNEE SURGERY Right   . TONSILLECTOMY      Allergies: Sulfa antibiotics  Medications: Prior to Admission medications   Medication Sig Start Date End Date Taking? Authorizing Provider  albuterol (PROVENTIL HFA;VENTOLIN HFA) 108 (90 Base) MCG/ACT inhaler Inhale 1-2 puffs into the lungs every 6 (six) hours as needed for wheezing or shortness of breath. 02/06/18   Arby BarrettePfeiffer, Marcy, MD  fluticasone (FLONASE) 50 MCG/ACT nasal spray Place 1 spray into both nostrils 2 (two) times daily. 06/09/19   [provider]  furosemide (LASIX) 20 MG tablet Take 20 mg by mouth every other day. 07/29/19   [provider]  lamoTRIgine (LAMICTAL) 150 MG tablet Take 150 mg by mouth daily after breakfast.  06/30/18   [provider]  lansoprazole (PREVACID) 15 MG capsule Take 15 mg by mouth daily as needed (acid reflux).    [provider]  metFORMIN (GLUCOPHAGE) 500 MG tablet Take 500 mg by mouth 2 (two) times daily with a meal.  10/28/13    [provider]  metoprolol tartrate (LOPRESSOR) 100 MG tablet Take 1 tablet by mouth 2 hours prior to CT scan 08/09/19   Tereso NewcomerWeaver, Scott T, PA-C  oxymetazoline (AFRIN) 0.05 % nasal spray Place 1 spray into both nostrils at bedtime.    [provider]  rivaroxaban (XARELTO) 20 MG TABS tablet Take 20 mg by mouth daily after breakfast.     [provider]  rOPINIRole (REQUIP XL) 4 MG 24 hr tablet Take 4-12 mg by mouth at bedtime as needed (restless leg).     [provider]  traMADol (ULTRAM) 50 MG tablet Take 50-100 mg by mouth daily. 07/29/19   [provider]  traZODone (DESYREL) 100 MG tablet Take 200-300 mg by mouth at bedtime as needed for sleep.     [provider]  TRINTELLIX 10 MG TABS tablet Take 10 mg by mouth daily.  10/27/18   [provider]  TRINTELLIX 20 MG TABS tablet Take 20 mg by mouth daily. 05/13/19   [provider]     Family History  Adopted: Yes    Social History   Socioeconomic History  . Marital status: Married    Spouse name: Not on file  . Number of children: 2  . Years of education: Not on file  . Highest education level: Not on file  Occupational History  . Occupation: Administrator  Tobacco Use  . Smoking status: Never Smoker  . Smokeless tobacco: Never Used  Vaping Use  . Vaping Use: Never used  Substance and Sexual Activity  . Alcohol use: Yes    Comment: "wine once or twice a month"  . Drug use: No  . Sexual activity: Not on file  Other Topics Concern  . Not on file  Social History Narrative  . Not on file   Social Determinants of Health   Financial Resource Strain:   . Difficulty of Paying Living Expenses: Not on file  Food Insecurity:   . Worried About Programme researcher, broadcasting/film/video in the Last Year: Not on file  . Ran Out of Food in the Last Year: Not on file  Transportation Needs:   . Lack of Transportation (Medical): Not on file  . Lack of Transportation (Non-Medical): Not  on file  Physical Activity:   . Days of Exercise per Week: Not on file  . Minutes of Exercise per Session: Not on file  Stress:   . Feeling of Stress : Not on file  Social Connections:   . Frequency of Communication with Friends and Family: Not on file  . Frequency of Social Gatherings with Friends and Family: Not on file  . Attends Religious Services: Not on file  . Active Member of Clubs or Organizations: Not on file  . Attends Banker Meetings: Not on file  . Marital Status: Not on file    ECOG Status: 2 - Symptomatic, <50% confined to bed  Review of Systems  Review of Systems: A 12 point ROS discussed and pertinent positives are indicated in the HPI above.  All other systems are negative.  Physical Exam No direct physical exam was performed (except for noted visual exam findings with Video Visits).   Vital Signs: LMP 01/29/2013   Imaging:  Ultrasound-guided IOVERA therapy of the right knee - 10/28/2019  Labs:  CBC: Recent Labs    05/12/19 0210 08/09/19 1557  WBC 7.6 7.7  HGB 12.4 12.4  HCT 41.1 37.8  PLT 239 292    COAGS: No results for input(s): INR, APTT in the last 8760 hours.  BMP: Recent Labs    04/25/19 1340 05/12/19 0210 08/09/19 1557  NA 143 140 142  K 4.6 4.1 4.3  CL 102 103 105  CO2 29 26 25   GLUCOSE 133* 111* 107*  BUN 15 17 13   CALCIUM 9.6 9.0 8.9  CREATININE 1.17* 1.00 1.06*  GFRNONAA 53* >60 59*  GFRAA 61 >60 68    LIVER FUNCTION TESTS: Recent Labs    05/12/19 0210  BILITOT 0.7  AST 19  ALT 24  ALKPHOS 70  PROT 6.7  ALBUMIN 4.0    TUMOR MARKERS: No results for input(s): AFPTM, CEA, CA199, CHROMGRNA in the last 8760 hours.  Assessment and Plan:  LACHE DAGHER is a 54 y.o. female with past medical history significant for pulmonary embolism, aortic insufficiency, diabetes, renal stones and sleep apnea who is seen in consultation today for evaluation of right knee pain on 6/4 and ultimately underwent IOVERA  treatment of the right knee on 6/18.   The patient states that  approximately 2 weeks after the IOVERA therapy she noticed a significant improvement in her right knee pain. As such, she was able to initiate an exercise plan and thus far has lost 22 pounds. She states that in August of this year she was able to go on a cruise with her husband and was able to enjoy the cruise without significant knee pain.  As expected, the patient states her pain has slowly returned recently and wishes to undergo repeat IOVERA therapy on the right knee if able.  Currently she rates her right knee pain as follows: At rest - 0/10 Activity - 8/10  Benefits and risks of repeat IOVERA Rx of the knee was discussed at length with the patient.  Again, I explained that if successful, the patient will experience a rapid pain reduction which can last for approximately 3 months.  I explained that while their knee pain may be reduced, the structural damage of the knee remains, and thus, this is not a curative technology and their knee pain will return.  Risks associated with the procedure include bleeding/bruising, infection and post procedural paresthesias/weakness.  I explained that she would be our first patient to undergo repeat IOVERA therapy however I am hopeful she will have a similar if not more durable response following a repeat cryoneurolysis.   Following this prolonged and detailed conversation, the patient wishes to pursue IOVERA therapy of the right knee.    As such, pending insurance approval, this procedure would be scheduled at [facility] at the next earliest convenience, ideally before the end of the year as the patient has already reached her insurance deductible.  The patient knows to call the interventional radiology clinic with any interval questions or concerns  PLAN: - Obtain insurance authorization and schedule IOVERA Rx of the right knee at [facility]  A copy of this report was  sent to the requesting provider on this date.  Electronically Signed: Simonne Come 04/03/2020, 2:23 PM   I spent a total of 15 Minutes in remote  clinical consultation, greater than 50% of which was counseling/coordinating care for right knee pain.    Visit type: Audio only (telephone). Audio (no video) only due to patient's lack of internet/smartphone capability. Alternative for in-person consultation at [facility], [address] [place], [place], [place]. This visit type was conducted due to national recommendations for restrictions regarding the COVID-19 Pandemic (e.g. social distancing).  This format is felt to be most appropriate for this patient at this time.  All issues noted in this document were discussed and addressed.

## 2020-04-13 ENCOUNTER — Other Ambulatory Visit (HOSPITAL_COMMUNITY): Payer: Self-pay | Admitting: Interventional Radiology

## 2020-04-13 DIAGNOSIS — G8929 Other chronic pain: Secondary | ICD-10-CM

## 2020-04-17 NOTE — Progress Notes (Signed)
Cardiology Office Note    Date:  04/18/2020   ID:  Amazing, Cowman [birth year], MRN [id number]  PCP:  Dois Davenport, MD  Cardiologist: Verne Carrow, MD EPS: None  Chief Complaint  Patient presents with  . Follow-up    History of Present Illness:  Erika Woods is a 54 y.o. female with history of pulmonary embolus 09/2017 and 11/2018 following anticoagulation, thoracic aortic aneurysm, trivial aortic insufficiency on echo 11/2018  Last seen in our office 08/09/2019 with shortness of breath with minimal improvement on Lasix.  2D echo 08/11/2019 LVEF 55 to 60% grade 1 DD cuspid aortic valve with mild to moderate AI mean gradient 12 mmHg aortic root 39 mm.  Coronary CTA 09/06/2019 calcium score 0 mildly dilated ascending aorta 4 cm.  Patient comes in for f/u. Is a Manufacturing systems engineer. Denies chest pain, dyspnea, palpitations. Had Covid19 in Feb and wheezes occasionally.  Needs a knee replacement but has to lose 85 lbs and has lost 40 lbs before they have to do surgery.Doing weight watchers.  Has a pulsating sensation at times in different locations of her right leg from upper leg to lower leg but mostly behind her knee. Not related to walking, sitting. No change in position and tylenol don't help.     Past Medical History:  Diagnosis Date  . Acute pulmonary embolism ([name]) 11/11/2018  . Acute pulmonary embolism with acute cor pulmonale ([name]) 09/16/2017  . Acute superficial venous thrombosis of left lower extremity   . Aortic insufficiency    Echocardiogram 08/2019: prob bicuspid AoV, mild to mod AI, mild AS (mean 12 mmHg), EF 55-60, no RWMA, Gr 1 DD, GLS -22.2%, normal RVSF, Ao Root 39 mm  . Coronary CTA    Coronary CTA 08/2019: Calcium score 0, no evidence of CAD, ascending aorta 4 cm  . Diabetes mellitus without complication ([name])   . DM (diabetes mellitus) ([name]) 08/18/2011  . Impaired fasting glucose   . Mood disorder ([name]) 08/18/2011  . Obesity, Class III, BMI 40-49.9 (morbid  obesity) ([name]) 09/16/2017  . Obstructive sleep apnea on CPAP   . Osteoarthritis of knee   . Personal history of pulmonary embolism   . PTSD (post-traumatic stress disorder) 08/18/2011  . Pulmonary emboli ([name]) 09/15/2017  . Renal disorder    kideny stones  . Restless leg syndrome   . Severe recurrent major depression without psychotic features ([name]) 02/17/2018  . Sleep apnea 08/18/2011  . Thoracic aortic aneurysm ([name])   . Thoracic aortic aneurysm without rupture [facility])     Past Surgical History:  Procedure Laterality Date  . arm surgery Bilateral Skin removal  . DILATATION & CURETTAGE/HYSTEROSCOPY WITH MYOSURE N/A 06/11/2018   Procedure: DILATATION & CURETTAGE/HYSTEROSCOPY WITH MYOSURE RESECTION OF ENDOMETRIAL THICKENING;  Surgeon: Richardean Chimera, MD;  Location: [facility];  Service: Gynecology;  Laterality: N/A;  BMI 56  . DILATION AND EVACUATION    . ESOPHAGOGASTRODUODENOSCOPY (EGD) WITH PROPOFOL N/A 07/15/2018   Procedure: ESOPHAGOGASTRODUODENOSCOPY (EGD) WITH PROPOFOL;  Surgeon: Willis Modena, MD;  Location: [facility];  Service: Endoscopy;  Laterality: N/A;  . ESOPHAGOGASTRODUODENOSCOPY (EGD) WITH PROPOFOL N/A 04/27/2019   Procedure: ESOPHAGOGASTRODUODENOSCOPY (EGD) WITH PROPOFOL;  Surgeon: Willis Modena, MD;  Location: [facility];  Service: Endoscopy;  Laterality: N/A;  . IR ABLATE LIVER CRYOABLATION  10/28/2019  . IR RADIOLOGIST EVAL & MGMT  10/14/2019  . IR RADIOLOGIST EVAL & MGMT  04/03/2020  . KIDNEY STONE SURGERY    . KNEE SURGERY Right   .  TONSILLECTOMY      Current Medications: Current Meds  Medication Sig  . albuterol (PROVENTIL HFA;VENTOLIN HFA) 108 (90 Base) MCG/ACT inhaler Inhale 1-2 puffs into the lungs every 6 (six) hours as needed for wheezing or shortness of breath.  . doxepin (SINEQUAN) 10 MG capsule Take by mouth.  . fluticasone (FLONASE) 50 MCG/ACT nasal spray Place 1 spray into both nostrils 2 (two) times daily.  Marland Kitchen gabapentin (NEURONTIN) 300 MG capsule Take 300 mg  by mouth 3 (three) times daily.  Marland Kitchen lamoTRIgine (LAMICTAL) 150 MG tablet Take 150 mg by mouth daily after breakfast.   . lansoprazole (PREVACID) 15 MG capsule Take 15 mg by mouth daily as needed (acid reflux).  . metFORMIN (GLUCOPHAGE) 500 MG tablet Take 500 mg by mouth 2 (two) times daily with a meal.   . oxymetazoline (AFRIN) 0.05 % nasal spray Place 1 spray into both nostrils at bedtime.  . rivaroxaban (XARELTO) 20 MG TABS tablet Take 20 mg by mouth daily after breakfast.   . rOPINIRole (REQUIP XL) 4 MG 24 hr tablet Take 4-12 mg by mouth at bedtime as needed (restless leg).   . traMADol (ULTRAM) 50 MG tablet Take 50-100 mg by mouth daily.  . TRINTELLIX 20 MG TABS tablet Take 20 mg by mouth daily.     Allergies:   Sulfa antibiotics   Social History   Socioeconomic History  . Marital status: Married    Spouse name: Not on file  . Number of children: 2  . Years of education: Not on file  . Highest education level: Not on file  Occupational History  . Occupation: Administrator  Tobacco Use  . Smoking status: Never Smoker  . Smokeless tobacco: Never Used  Vaping Use  . Vaping Use: Never used  Substance and Sexual Activity  . Alcohol use: Yes    Comment: "wine once or twice a month"  . Drug use: No  . Sexual activity: Not on file  Other Topics Concern  . Not on file  Social History Narrative  . Not on file   Social Determinants of Health   Financial Resource Strain:   . Difficulty of Paying Living Expenses: Not on file  Food Insecurity:   . Worried About Programme researcher, broadcasting/film/video in the Last Year: Not on file  . Ran Out of Food in the Last Year: Not on file  Transportation Needs:   . Lack of Transportation (Medical): Not on file  . Lack of Transportation (Non-Medical): Not on file  Physical Activity:   . Days of Exercise per Week: Not on file  . Minutes of Exercise per Session: Not on file  Stress:   . Feeling of Stress : Not on file  Social Connections:   . Frequency  of Communication with Friends and Family: Not on file  . Frequency of Social Gatherings with Friends and Family: Not on file  . Attends Religious Services: Not on file  . Active Member of Clubs or Organizations: Not on file  . Attends Banker Meetings: Not on file  . Marital Status: Not on file     Family History:  The patient's family history is not on file. She was adopted.   ROS:   Please see the history of present illness.    ROS All other systems reviewed and are negative.   PHYSICAL EXAM:   VS:  BP 122/86   Pulse 95   Ht 5\' 5"  (1.651 m)   Wt 290 lb  9.6 oz (131.8 kg)   LMP 01/29/2013   SpO2 96%   BMI 48.36 kg/m   Physical Exam  GEN: obese, in no acute distress  Neck: no JVD, carotid bruits, or masses Cardiac:RRR; no murmurs, rubs, or gallops  Respiratory:  clear to auscultation bilaterally, normal work of breathing GI: soft, nontender, nondistended, + BS Ext: without cyanosis, clubbing, or edema, Good distal pulses bilaterally Neuro:  Alert and Oriented x 3 Psych: euthymic mood, full affect  Wt Readings from Last 3 Encounters:  04/18/20 290 lb 9.6 oz (131.8 kg)  08/09/19 (!) 326 lb 12.8 oz (148.2 kg)  05/12/19 (!) 320 lb 6.4 oz (145.3 kg)      Studies/Labs Reviewed:   EKG:  EKG is not ordered today.  Recent Labs: 05/12/2019: ALT 24 08/09/2019: BUN 13; Creatinine, Ser 1.06; Hemoglobin 12.4; Platelets 292; Potassium 4.3; Sodium 142   Lipid Panel    Component Value Date/Time   CHOL 169 02/18/2018 0623   TRIG 113 02/18/2018 0623   HDL 43 02/18/2018 0623   CHOLHDL 3.9 02/18/2018 0623   VLDL 23 02/18/2018 0623   LDLCALC 103 (H) 02/18/2018 0623    Additional studies/ records that were reviewed today include:  Coronary CTA 09/06/2019 IMPRESSION: 1. Coronary calcium score of 0. This was 0 percentile for age and sex matched control.   2. Normal coronary origin with right dominance.   3. No evidence of CAD.  CAD RADS 0.   4. Consider  non-atherosclerotic causes of chest pain.   5. Mildly dilated ascending aorta with greatest diameter 4cm.   Armanda Magic     Electronically Signed   By: Armanda Magic   On: 09/08/2019 12:10  2D echo 08/2019  IMPRESSIONS     1. Left ventricular ejection fraction, by estimation, is 55 to 60%. The  left ventricle has normal function. The left ventricle has no regional  wall motion abnormalities. Left ventricular diastolic parameters are  consistent with Grade I diastolic  dysfunction (impaired relaxation). There is Abnormal septal motion  secondary to conduction system disease. The average left ventricular  global longitudinal strain is -22.2 %.   2. Right ventricular systolic function is normal. The right ventricular  size is normal. Tricuspid regurgitation signal is inadequate for assessing  PA pressure.   3. The mitral valve is normal in structure. No evidence of mitral valve  regurgitation. No evidence of mitral stenosis.   4. Probable forme fruste bicuspid aortic valve. Partial fusion of right  and left coronary cusps. The aortic valve is abnormal. Aortic valve  regurgitation is mild to moderate. Mild aortic valve stenosis. Aortic  valve mean gradient measures 12.0 mmHg.   5. Aortic dilatation noted. There is mild dilatation of the aortic root  measuring 39 mm. Ascending aorta is not well visualized for comparison to  prior exam.   6. The inferior vena cava is normal in size with greater than 50%  respiratory variability, suggesting right atrial pressure of 3 mmHg.    Risk Assessment/Calculations:         ASSESSMENT:    1. Thoracic aortic aneurysm without rupture ([name])   2. History of pulmonary embolism   3. Nonrheumatic aortic valve insufficiency   4. Shortness of breath      PLAN:  In order of problems listed above: Thoracic aortic aneurysm 4 cm on CT 08/2019 for repeat 08/2020  History of pulmonary embolus on lifelong anticoagulation with Xarelto. Doing  well. Check labs today  Mild to moderate  aortic insufficiency  Right leg pain mostly behind her right knee. No suspicion for dvt. Would f/u with ortho.    Shared Decision Making/Informed Consent        Medication Adjustments/Labs and Tests Ordered: Current medicines are reviewed at length with the patient today.  Concerns regarding medicines are outlined above.  Medication changes, Labs and Tests ordered today are listed in the Patient Instructions below. Patient Instructions  Medication Instructions:  Your physician recommends that you continue on your current medications as directed. Please refer to the Current Medication list given to you today.  *If you need a refill on your cardiac medications before your next appointment, please call your pharmacy*   Lab Work: TODAY: CMET, CBC  If you have labs (blood work) drawn today and your tests are completely normal, you will receive your results only by: Marland Kitchen. MyChart Message (if you have MyChart) OR . A paper copy in the mail If you have any lab test that is abnormal or we need to change your treatment, we will call you to review the results.   Testing/Procedures: None  Follow-Up: At [facility], you and your health needs are our priority.  As part of our continuing mission to provide you with exceptional heart care, we have created designated Provider Care Teams.  These Care Teams include your primary Cardiologist (physician) and Advanced Practice Providers (APPs -  Physician Assistants and Nurse Practitioners) who all work together to provide you with the care you need, when you need it.  Your next appointment:   1 year(s)  The format for your next appointment:   In Person  Provider:   You may see Verne Carrowhristopher McAlhany, MD or one of the following Advanced Practice Providers on your designated Care Team:    Ronie Spiesayna Dunn, PA-C  Jacolyn ReedyMichele Regene Mccarthy, PA-C      Signed, Jacolyn ReedyMichele Marrissa Dai, PA-C  04/18/2020 1:22 PM    [facility] [address], [place], [place]  [address] Phone: [phone]) [phone]; Fax: [phone]) [phone]

## 2020-04-18 ENCOUNTER — Encounter: Payer: Self-pay | Admitting: Physician Assistant

## 2020-04-18 ENCOUNTER — Ambulatory Visit (INDEPENDENT_AMBULATORY_CARE_PROVIDER_SITE_OTHER): Payer: BC Managed Care – PPO | Admitting: Physician Assistant

## 2020-04-18 ENCOUNTER — Other Ambulatory Visit: Payer: Self-pay

## 2020-04-18 VITALS — BP 122/86 | HR 95 | Ht 65.0 in | Wt 290.6 lb

## 2020-04-18 DIAGNOSIS — Z86711 Personal history of pulmonary embolism: Secondary | ICD-10-CM | POA: Diagnosis not present

## 2020-04-18 DIAGNOSIS — I712 Thoracic aortic aneurysm, without rupture, unspecified: Secondary | ICD-10-CM

## 2020-04-18 DIAGNOSIS — R0602 Shortness of breath: Secondary | ICD-10-CM

## 2020-04-18 DIAGNOSIS — I351 Nonrheumatic aortic (valve) insufficiency: Secondary | ICD-10-CM | POA: Diagnosis not present

## 2020-04-18 NOTE — Patient Instructions (Signed)
Medication Instructions:  Your physician recommends that you continue on your current medications as directed. Please refer to the Current Medication list given to you today.  *If you need a refill on your cardiac medications before your next appointment, please call your pharmacy*   Lab Work: TODAY: CMET, CBC  If you have labs (blood work) drawn today and your tests are completely normal, you will receive your results only by: Marland Kitchen MyChart Message (if you have MyChart) OR . A paper copy in the mail If you have any lab test that is abnormal or we need to change your treatment, we will call you to review the results.   Testing/Procedures: None  Follow-Up: At [facility], you and your health needs are our priority.  As part of our continuing mission to provide you with exceptional heart care, we have created designated Provider Care Teams.  These Care Teams include your primary Cardiologist (physician) and Advanced Practice Providers (APPs -  Physician Assistants and Nurse Practitioners) who all work together to provide you with the care you need, when you need it.  Your next appointment:   1 year(s)  The format for your next appointment:   In Person  Provider:   You may see Verne Carrow, MD or one of the following Advanced Practice Providers on your designated Care Team:    Ronie Spies, PA-C  Jacolyn Reedy, PA-C

## 2020-04-19 LAB — COMPREHENSIVE METABOLIC PANEL
ALT: 14 IU/L (ref 0–32)
AST: 16 IU/L (ref 0–40)
Albumin/Globulin Ratio: 1.8 (ref 1.2–2.2)
Albumin: 4.2 g/dL (ref 3.8–4.9)
Alkaline Phosphatase: 82 IU/L (ref 44–121)
BUN/Creatinine Ratio: 14 (ref 9–23)
BUN: 15 mg/dL (ref 6–24)
Bilirubin Total: 0.4 mg/dL (ref 0.0–1.2)
CO2: 23 mmol/L (ref 20–29)
Calcium: 9.4 mg/dL (ref 8.7–10.2)
Chloride: 104 mmol/L (ref 96–106)
Creatinine, Ser: 1.09 mg/dL — ABNORMAL HIGH (ref 0.57–1.00)
GFR calc Af Amer: 66 mL/min/{1.73_m2} (ref >59)
GFR calc non Af Amer: 57 mL/min/{1.73_m2} — ABNORMAL LOW (ref >59)
Globulin, Total: 2.4 g/dL (ref 1.5–4.5)
Glucose: 80 mg/dL (ref 65–99)
Potassium: 4.6 mmol/L (ref 3.5–5.2)
Sodium: 141 mmol/L (ref 134–144)
Total Protein: 6.6 g/dL (ref 6.0–8.5)

## 2020-04-19 LAB — CBC
Hematocrit: 37.7 % (ref 34.0–46.6)
Hemoglobin: 12.5 g/dL (ref 11.1–15.9)
MCH: 28.3 pg (ref 26.6–33.0)
MCHC: 33.2 g/dL (ref 31.5–35.7)
MCV: 85 fL (ref 79–97)
Platelets: 265 10*3/uL (ref 150–450)
RBC: 4.42 x10E6/uL (ref 3.77–5.28)
RDW: 13.4 % (ref 11.7–15.4)
WBC: 7.7 10*3/uL (ref 3.4–10.8)

## 2020-04-24 ENCOUNTER — Other Ambulatory Visit: Payer: BC Managed Care – PPO

## 2020-04-25 ENCOUNTER — Ambulatory Visit: Payer: BC Managed Care – PPO | Admitting: Physician Assistant

## 2020-05-07 ENCOUNTER — Ambulatory Visit (HOSPITAL_COMMUNITY)
Admission: RE | Admit: 2020-05-07 | Discharge: 2020-05-07 | Disposition: A | Discharge status: Home / Self Care | Payer: BC Managed Care – PPO | Source: Ambulatory Visit | Attending: Interventional Radiology | Admitting: Interventional Radiology

## 2020-05-07 ENCOUNTER — Other Ambulatory Visit: Payer: Self-pay

## 2020-05-07 DIAGNOSIS — M25561 Pain in right knee: Secondary | ICD-10-CM | POA: Insufficient documentation

## 2020-05-07 DIAGNOSIS — G8929 Other chronic pain: Secondary | ICD-10-CM

## 2020-05-07 HISTORY — PX: IR ABLATE LIVER CRYOABLATION: IMG5524

## 2020-05-07 MED ORDER — LIDOCAINE-EPINEPHRINE 1 %-1:100000 IJ SOLN
INTRAMUSCULAR | Status: AC
  Filled 2020-05-07: qty 1

## 2020-05-07 NOTE — Procedures (Signed)
Pre procedural Dx: Right sided knee pain Post procedural Dx: Same  Technically successful cryoneurolysis (IOVERA) of the IAFCN, MAFCN, and the ISN for right sided knee pain with clinically significant reduction in postprocedural pain scale.   EBL: Trace Complications: None immediate  Pre procedural WOMAC score: 43/96  Pre procedural knee pain: At rest: 0/10 With activity: 8/10  Post procedural knee pain: At rest: 0/10 With activity: 2/10  Katherina Right, MD Pager #: [phone]

## 2020-05-10 ENCOUNTER — Other Ambulatory Visit: Payer: BC Managed Care – PPO

## 2020-05-10 ENCOUNTER — Ambulatory Visit
Admission: RE | Admit: 2020-05-10 | Discharge: 2020-05-10 | Disposition: A | Discharge status: Home / Self Care | Payer: BC Managed Care – PPO | Source: Ambulatory Visit | Attending: Gastroenterology | Admitting: Gastroenterology

## 2020-05-10 DIAGNOSIS — K7460 Unspecified cirrhosis of liver: Secondary | ICD-10-CM

## 2020-05-21 ENCOUNTER — Other Ambulatory Visit: Payer: Self-pay | Admitting: Interventional Radiology

## 2020-05-21 DIAGNOSIS — M25561 Pain in right knee: Secondary | ICD-10-CM

## 2020-06-07 ENCOUNTER — Other Ambulatory Visit: Payer: Self-pay

## 2020-06-07 ENCOUNTER — Ambulatory Visit
Admission: RE | Admit: 2020-06-07 | Discharge: 2020-06-07 | Disposition: A | Discharge status: Home / Self Care | Payer: BC Managed Care – PPO | Source: Ambulatory Visit | Attending: Interventional Radiology | Admitting: Interventional Radiology

## 2020-06-07 DIAGNOSIS — M25561 Pain in right knee: Secondary | ICD-10-CM

## 2020-06-07 NOTE — Progress Notes (Signed)
Patient ID: Erika Woods, female   DOB: [birth year], 55 y.o.   MRN: [id number]         Chief Complaint: Post IOVERA Rx of the right knee on 10/28/19 and 05/07/20  Referring Physician(s): Dalldorf,Peter  History of Present Illness: Erika Woods a 55 y.o.femalewith past medical history significantfor pulmonary embolism, aortic insufficiency, diabetes, renal stones andsleep apnea who underwent IOVERA treatment of the right knee on 6/18 and again on 05/07/20.   In review, the patient states that she developed the insidious onset of right knee pain in 2018. She subsequently underwent arthroscopy in 2019 with some improvement in her knee pain. Since that time, she has undergone several steroid and Synvisc injections though her pain is significantly worsened during the past 6 months and she has now been told that given the advanced degenerative change of her right knee, she will ultimately require a total knee replacement. Unfortunately, the patient is not currently a candidate for total knee replacement given her obesity with goal of losing 70 pounds. Patient stated her resting knee pain is a 0/10 however worsens to 10 /10 with even very limited activity. She ambulates with a crutch around the house.   Patient underwent technically successful cryoneurolysis of the right knee on 10/28/2019 which resulted in significant improvement in her right knee. She was able to initiate an exercise plan and thus far has lost 22 pounds. She states that in August of this year she was able to go on a cruise with her husband and was able to enjoy the cruise without significant knee pain.  As expected, the patient states her pain has slowly returned and as such she underwent repeat cryoneurolysis of the right knee on 05/07/2020.  On the day of her most recent procedure, the patient's pain scores were as follows:  Preprocedural Womac score: 43/96  Preprocedural knee pain: At rest: 0/10 With  activity: 8/10  Postprocedural knee pain: At rest: 0/10 With activity: 2/10  Currently she rates her right knee pain as follows: At rest - 0/10 Activity - 3-4/10  Patient states that the second procedure was less painful than her initial experience (though she states this may be due to realizing what to expect).  Additionally, she states that she experienced immediate and more durable pain relief (her first procedure resulted in rebound knee pain which ultimately resolved approximately 2 weeks following the procedure).  Additionally, the patient states that her knee is subjectively less numb than her first procedure.      Past Medical History:  Diagnosis Date  . Acute pulmonary embolism ([name]) 11/11/2018  . Acute pulmonary embolism with acute cor pulmonale ([name]) 09/16/2017  . Acute superficial venous thrombosis of left lower extremity   . Aortic insufficiency    Echocardiogram 08/2019: prob bicuspid AoV, mild to mod AI, mild AS (mean 12 mmHg), EF 55-60, no RWMA, Gr 1 DD, GLS -22.2%, normal RVSF, Ao Root 39 mm  . Coronary CTA    Coronary CTA 08/2019: Calcium score 0, no evidence of CAD, ascending aorta 4 cm  . Diabetes mellitus without complication ([name])   . DM (diabetes mellitus) ([name]) 08/18/2011  . Impaired fasting glucose   . Mood disorder ([name]) 08/18/2011  . Obesity, Class III, BMI 40-49.9 (morbid obesity) ([name]) 09/16/2017  . Obstructive sleep apnea on CPAP   . Osteoarthritis of knee   . Personal history of pulmonary embolism   . PTSD (post-traumatic stress disorder) 08/18/2011  . Pulmonary emboli ([name]) 09/15/2017  . Renal  disorder    kideny stones  . Restless leg syndrome   . Severe recurrent major depression without psychotic features ([name]) 02/17/2018  . Sleep apnea 08/18/2011  . Thoracic aortic aneurysm ([name])   . Thoracic aortic aneurysm without rupture [facility])     Past Surgical History:  Procedure Laterality Date  . arm surgery Bilateral Skin removal  . DILATATION &  CURETTAGE/HYSTEROSCOPY WITH MYOSURE N/A 06/11/2018   Procedure: DILATATION & CURETTAGE/HYSTEROSCOPY WITH MYOSURE RESECTION OF ENDOMETRIAL THICKENING;  Surgeon: Richardean Chimera, MD;  Location: [facility];  Service: Gynecology;  Laterality: N/A;  BMI 56  . DILATION AND EVACUATION    . ESOPHAGOGASTRODUODENOSCOPY (EGD) WITH PROPOFOL N/A 07/15/2018   Procedure: ESOPHAGOGASTRODUODENOSCOPY (EGD) WITH PROPOFOL;  Surgeon: Willis Modena, MD;  Location: [facility];  Service: Endoscopy;  Laterality: N/A;  . ESOPHAGOGASTRODUODENOSCOPY (EGD) WITH PROPOFOL N/A 04/27/2019   Procedure: ESOPHAGOGASTRODUODENOSCOPY (EGD) WITH PROPOFOL;  Surgeon: Willis Modena, MD;  Location: [facility];  Service: Endoscopy;  Laterality: N/A;  . IR ABLATE LIVER CRYOABLATION  10/28/2019  . IR ABLATE LIVER CRYOABLATION  05/07/2020  . IR RADIOLOGIST EVAL & MGMT  10/14/2019  . IR RADIOLOGIST EVAL & MGMT  04/03/2020  . KIDNEY STONE SURGERY    . KNEE SURGERY Right   . TONSILLECTOMY      Allergies: Sulfa antibiotics  Medications: Prior to Admission medications   Medication Sig Start Date End Date Taking? Authorizing Provider  albuterol (PROVENTIL HFA;VENTOLIN HFA) 108 (90 Base) MCG/ACT inhaler Inhale 1-2 puffs into the lungs every 6 (six) hours as needed for wheezing or shortness of breath. 02/06/18   Arby Barrette, MD  doxepin (SINEQUAN) 10 MG capsule Take by mouth. 03/12/20   [provider]  fluticasone (FLONASE) 50 MCG/ACT nasal spray Place 1 spray into both nostrils 2 (two) times daily. 06/09/19   [provider]  gabapentin (NEURONTIN) 300 MG capsule Take 300 mg by mouth 3 (three) times daily. 04/01/20   [provider]  lamoTRIgine (LAMICTAL) 150 MG tablet Take 150 mg by mouth daily after breakfast.  06/30/18   [provider]  lansoprazole (PREVACID) 15 MG capsule Take 15 mg by mouth daily as needed (acid reflux).    [provider]  metFORMIN (GLUCOPHAGE) 500 MG tablet Take 500 mg by  mouth 2 (two) times daily with a meal.  10/28/13   [provider]  oxymetazoline (AFRIN) 0.05 % nasal spray Place 1 spray into both nostrils at bedtime.    [provider]  rivaroxaban (XARELTO) 20 MG TABS tablet Take 20 mg by mouth daily after breakfast.     [provider]  rOPINIRole (REQUIP XL) 4 MG 24 hr tablet Take 4-12 mg by mouth at bedtime as needed (restless leg).     [provider]  traMADol (ULTRAM) 50 MG tablet Take 50-100 mg by mouth daily. 07/29/19   [provider]  TRINTELLIX 20 MG TABS tablet Take 20 mg by mouth daily. 05/13/19   [provider]     Family History  Adopted: Yes    Social History   Socioeconomic History  . Marital status: Married    Spouse name: Not on file  . Number of children: 2  . Years of education: Not on file  . Highest education level: Not on file  Occupational History  . Occupation: Administrator  Tobacco Use  . Smoking status: Never Smoker  . Smokeless tobacco: Never Used  Vaping Use  . Vaping Use: Never used  Substance and Sexual  Activity  . Alcohol use: Yes    Comment: "wine once or twice a month"  . Drug use: No  . Sexual activity: Not on file  Other Topics Concern  . Not on file  Social History Narrative  . Not on file   Social Determinants of Health   Financial Resource Strain: Not on file  Food Insecurity: Not on file  Transportation Needs: Not on file  Physical Activity: Not on file  Stress: Not on file  Social Connections: Not on file    ECOG Status: 1 - Symptomatic but completely ambulatory  Review of Systems  Review of Systems: A 12 point ROS discussed and pertinent positives are indicated in the HPI above.  All other systems are negative.  Physical Exam No direct physical exam was performed (except for noted visual exam findings with Video Visits).   Vital Signs: LMP 01/29/2013   Imaging:  Ultrasound-guided right knee cryoneurolysis -  05/07/2020; 10/28/2019  US Abdomen Limited RUQ  Result Date: 05/10/2020 CLINICAL DATA:  Cirrhosis EXAM: ULTRASOUND ABDOMEN LIMITED RIGHT UPPER QUADRANT COMPARISON:  11/10/2019 FINDINGS: Gallbladder: No gallstones or wall thickening visualized. No sonographic Murphy sign noted by sonographer. Common bile duct: Diameter: Normal caliber, 2 mm Liver: Increased echotexture compatible with fatty infiltration. No focal abnormality or biliary ductal dilatation. Focal fatty sparing near the gallbladder fossa portal vein is patent on color Doppler imaging with normal direction of blood flow towards the liver. Other: None. IMPRESSION: Hepatic steatosis. No focal liver abnormality. Electronically Signed   By: Charlett Nose M.D.   On: 05/10/2020 10:57    Labs:  CBC: Recent Labs    08/09/19 1557 04/18/20 1326  WBC 7.7 7.7  HGB 12.4 12.5  HCT 37.8 37.7  PLT 292 265    COAGS: No results for input(s): INR, APTT in the last 8760 hours.  BMP: Recent Labs    08/09/19 1557 04/18/20 1326  NA 142 141  K 4.3 4.6  CL 105 104  CO2 25 23  GLUCOSE 107* 80  BUN 13 15  CALCIUM 8.9 9.4  CREATININE 1.06* 1.09*  GFRNONAA 59* 57*  GFRAA 68 66    LIVER FUNCTION TESTS: Recent Labs    04/18/20 1326  BILITOT 0.4  AST 16  ALT 14  ALKPHOS 82  PROT 6.6  ALBUMIN 4.2    TUMOR MARKERS: No results for input(s): AFPTM, CEA, CA199, CHROMGRNA in the last 8760 hours.  Assessment and Plan:  Erika Woods a 55 y.o.femalewith past medical history significantfor pulmonary embolism, aortic insufficiency, diabetes, renal stones andsleep apnea who underwent IOVERA treatment of the right knee on 6/18 and again on 05/07/20 and again is pleased with the procedural outcome as follows:  Preprocedural Womac score: 43/96  Preprocedural knee pain: At rest: 0/10 With activity: 8/10  Postprocedural knee pain: At rest: 0/10 With activity: 2/10  Patient states that the second procedure was less painful than  her initial experience (though she states this may be due to realizing what to expect).  Additionally, she states that she experienced immediate and more durable pain relief (her first procedure resulted in rebound knee pain which ultimately resolved approximately 2 weeks following the procedure).  Additionally, the patient states that her knee is subjectively less numb than her first procedure.  Currently she rates her right knee pain as follows: At rest - 0/10 Activity - 3-4/10  She states that she has been able to maintain her diet and has lost approximately 5 pounds in the  interval.  Currently, she has lost 27 pounds of her 70 pound weight loss goal and is hopeful to undergo a total knee replacement this summer.  The patient knows to call the interventional radiology clinic if she wished to pursue an additional cryoneurolysis, typically performed 6 months following her most recent procedure (late June/early July of this year).  Otherwise, the patient may follow-up interventional radiology clinic on a PRN basis.  A copy of this report was sent to the requesting provider on this date.  Electronically Signed: Simonne Come 06/07/2020, 2:36 PM   I spent a total of 10 Minutes in remote  clinical consultation, greater than 50% of which was counseling/coordinating care for right knee pain, post IOVERA.    Visit type: Audio only (telephone). Audio (no video) only due to patient's lack of internet/smartphone capability. Alternative for in-person consultation at [facility], [address] [place], [place], [place]. This visit type was conducted due to national recommendations for restrictions regarding the COVID-19 Pandemic (e.g. social distancing).  This format is felt to be most appropriate for this patient at this time.  All issues noted in this document were discussed and addressed.

## 2020-08-22 ENCOUNTER — Telehealth: Payer: Self-pay | Admitting: Nurse Practitioner

## 2020-08-22 DIAGNOSIS — R0602 Shortness of breath: Secondary | ICD-10-CM

## 2020-08-22 DIAGNOSIS — I351 Nonrheumatic aortic (valve) insufficiency: Secondary | ICD-10-CM

## 2020-08-22 NOTE — Telephone Encounter (Signed)
Sorry, did not mean to route call to Dr. Flora Lipps or Raynelle Fanning, RN. Message was indicated for [facility] street triage for scheduling.

## 2020-08-22 NOTE — Telephone Encounter (Signed)
Called patient and advised her that BMET is needed prior to CT on 5/2. I asked her to call back to let us know if she would prefer to come to our office or to a [facility] location.  Order for bmet placed.

## 2020-08-22 NOTE — Telephone Encounter (Signed)
Raynelle Fanning:  Can you order this?  Gerri Spore T. Flora Lipps, MD, [facility]  [facility]  [address], [address] [place], [place] [address] [phone]  11:18 AM

## 2020-08-22 NOTE — Telephone Encounter (Signed)
-----   Message from Fabiola Backer, [place] sent at 08/22/2020  9:53 AM EDT ----- Regarding: BMET Needed Hey,  Can you please place a BMET on this patient?  Thanks

## 2020-08-27 NOTE — Telephone Encounter (Signed)
Left message for Pt requesting call back to schedule lab work prior to CT on 09/10/20  Also sent mychart message.

## 2020-08-28 ENCOUNTER — Telehealth (HOSPITAL_COMMUNITY): Payer: Self-pay | Admitting: Physician Assistant

## 2020-08-28 NOTE — Telephone Encounter (Signed)
Per scheduler, pt has cancelled her echo and ct.  No bmet needed at this time.  She has not rescheduled at this time.

## 2020-08-28 NOTE — Telephone Encounter (Signed)
08/28/20 pt called and cancelled ECHO and CT and did not wish to reschedule at this time @ 9:53/LBW   Order will be removed from the ECHO WQ and if patient calls back to reschedule we will reinstate the order. Thank you.

## 2020-09-10 ENCOUNTER — Inpatient Hospital Stay: Admission: RE | Admit: 2020-09-10 | Payer: BC Managed Care – PPO | Source: Ambulatory Visit

## 2020-09-12 ENCOUNTER — Other Ambulatory Visit (HOSPITAL_COMMUNITY): Payer: Self-pay

## 2020-09-20 ENCOUNTER — Other Ambulatory Visit: Payer: Self-pay | Admitting: Physical Medicine and Rehabilitation

## 2020-09-20 DIAGNOSIS — M5416 Radiculopathy, lumbar region: Secondary | ICD-10-CM

## 2020-09-24 ENCOUNTER — Other Ambulatory Visit: Payer: Self-pay

## 2020-09-24 DIAGNOSIS — R202 Paresthesia of skin: Secondary | ICD-10-CM

## 2020-09-25 ENCOUNTER — Ambulatory Visit (INDEPENDENT_AMBULATORY_CARE_PROVIDER_SITE_OTHER): Payer: Commercial Managed Care - PPO | Admitting: Neurology

## 2020-09-25 ENCOUNTER — Other Ambulatory Visit: Payer: Self-pay

## 2020-09-25 DIAGNOSIS — R202 Paresthesia of skin: Secondary | ICD-10-CM

## 2020-09-25 DIAGNOSIS — M5417 Radiculopathy, lumbosacral region: Secondary | ICD-10-CM

## 2020-09-25 NOTE — Procedures (Signed)
[facility]  [address], [address]  [place], [place] [address] Tel: [phone] Fax:  [phone] Test Date:  09/25/2020  Patient: Erika Woods DOB: 1966/02/23 Physician: Nita Sickle, DO  Sex: Female Height: 5\' 5"  Ref Phys: , MD  ID#: Claria Dice   Technician:    Patient Complaints: This is a 55 year old female referred for evaluation of left leg radicular pain.  NCV & EMG Findings: Extensive electrodiagnostic testing of the left lower extremity shows:  1. Left sural and superficial peroneal sensory responses are within normal limits. 2. Left peroneal motor responses within normal limits.  Left tibial motor response shows reduced amplitude (3.5 mV).   3. Left tibial H reflex study is absent.   4. Chronic motor axonal loss changes are seen affecting the left S1 myotome, without accompanied active denervation.   Impression: 1. Chronic S1 radiculopathy affecting the left lower extremity, mild. 2. There is no evidence of a sensorimotor polyneuropathy affecting the lower extremity.   ___________________________ 59, DO    Nerve Conduction Studies Anti Sensory Summary Table   Stim Site NR Peak (ms) Norm Peak (ms) P-T Amp (V) Norm P-T Amp  Left Sup Peroneal Anti Sensory (Ant Lat Mall)  33C  12 cm    2.3 <4.6 8.0 >4  Left Sural Anti Sensory (Lat Mall)  33C  Calf    2.4 <4.6 13.0 >4   Motor Summary Table   Stim Site NR Onset (ms) Norm Onset (ms) O-P Amp (mV) Norm O-P Amp Site1 Site2 Delta-0 (ms) Dist (cm) Vel (m/s) Norm Vel (m/s)  Left Peroneal Motor (Ext Dig Brev)  33C  Ankle    2.7 <6.0 3.3 >2.5 B Fib Ankle 6.9 34.0 49 >40  B Fib    9.6  2.9  Poplt B Fib 1.5 9.0 60 >40  Poplt    11.1  2.8         Left Tibial Motor (Abd Hall Brev)  33C  Ankle    3.2 <6.0 3.5 >4 Knee Ankle 7.6 40.0 53 >40  Knee    10.8  1.2          H Reflex Studies   NR H-Lat (ms) Lat Norm (ms) L-R H-Lat (ms)  Left Tibial (Gastroc)  33C  NR  <35    EMG   Side  Muscle Ins Act Fibs Psw Fasc Number Recrt Dur Dur. Amp Amp. Poly Poly. Comment  Left AntTibialis Nml Nml Nml Nml Nml Nml Nml Nml Nml Nml Nml Nml N/A  Left Gastroc Nml Nml Nml Nml 1- Rapid Some 1+ Some 1+ Some 1+ N/A  Left RectFemoris Nml Nml Nml Nml Nml Nml Nml Nml Nml Nml Nml Nml N/A  Left BicepsFemS Nml Nml Nml Nml 1- Rapid Some 1+ Some 1+ Some 1+ N/A      Waveforms:

## 2020-09-30 ENCOUNTER — Other Ambulatory Visit: Payer: Self-pay

## 2020-09-30 ENCOUNTER — Ambulatory Visit
Admission: RE | Admit: 2020-09-30 | Discharge: 2020-09-30 | Disposition: A | Discharge status: Home / Self Care | Payer: No Typology Code available for payment source | Source: Ambulatory Visit | Attending: Physical Medicine and Rehabilitation | Admitting: Physical Medicine and Rehabilitation

## 2020-09-30 DIAGNOSIS — M5416 Radiculopathy, lumbar region: Secondary | ICD-10-CM

## 2020-10-03 ENCOUNTER — Telehealth: Payer: Self-pay | Admitting: *Deleted

## 2020-10-03 ENCOUNTER — Other Ambulatory Visit: Payer: Self-pay | Admitting: Orthopedic Surgery

## 2020-10-03 NOTE — Telephone Encounter (Signed)
Notes faxed to surgeon. This phone note will be removed from the preop pool. Tereso Newcomer, PA-C  10/03/2020 12:37 PM

## 2020-10-03 NOTE — Telephone Encounter (Signed)
   [facility] Pre-operative Risk Assessment    Patient Name: Erika Woods  DOB: 02-Nov-1965  MRN: [id number]   [facility] STAFF: - Please ensure there is not already an duplicate clearance open for this procedure. - Under Visit Info/Reason for Call, type in Other and utilize the format Clearance MM/DD/YY or Clearance TBD. Do not use dashes or single digits. - If request is for dental extraction, please clarify the # of teeth to be extracted.  Request for surgical clearance:  1. What type of surgery is being performed?  LEFT LUMBAR 5 - SACRUM / MICRODISKECTOMY    2. When is this surgery scheduled?  10/31/20   What type of clearance is required (medical clearance vs. Pharmacy clearance to hold med vs. Both)?  MEDICAL 3. Are there any medications that need to be held prior to surgery and how long? N/A   4. Practice name and name of physician performing surgery?  [facility] / DR. DUMONSKI   5. What is the office phone number?  [phone]   7.   What is the office fax number?  [phone]  8.   Anesthesia type (None, local, MAC, general) ?    Jeanann Lewandowsky 10/03/2020, 12:10 PM  _________________________________________________________________   (provider comments below)

## 2020-10-03 NOTE — Telephone Encounter (Signed)
   Name: Erika Woods DOB: Aug 03, 1965  MRN: [id number]  Primary Cardiologist: Verne Carrow, MD  Chart reviewed as part of pre-operative protocol coverage.   55 y.o. female with . Aortic insufficiency o Echocardiogram 4/21: mild to mod AI, mild AS, normal EF  . Diabetes mellitus  . Hx of pulmonary embolism on lifelong anticoagulation  . Thoracic aortic aneurysm  o CT 4/21: 4 cm  . Cor CTA 4/21: Ca2+ score 0; no CAD  Last OV:  04/18/20 with Jacolyn Reedy, PA-C  Procedure:  L5-S1 microdiskectomy  Rx:  No request to hold  RCRI:  Perioperative Risk of Major Cardiac Event is (%): 0.4 (low risk)   Patient was contacted 10/03/2020 in reference to pre-operative risk assessment for pending surgery as outlined below.    Since last seen, Erika Woods has done well without chest pain, shortness of breath.  She is able to achieve > 4 METs.  Recommendations: . Based on ACC/AHA guidelines, the patient is at acceptable risk for the planned procedure and may proceed without further cardiovascular testing.  Marland Kitchen Her anticoagulation is managed by her PCP.  Please call with questions. Tereso Newcomer, PA-C 10/03/2020, 12:35 PM

## 2020-10-15 ENCOUNTER — Other Ambulatory Visit: Payer: Self-pay | Admitting: Obstetrics and Gynecology

## 2020-10-15 ENCOUNTER — Other Ambulatory Visit: Payer: Self-pay | Admitting: Cardiology

## 2020-10-15 ENCOUNTER — Encounter: Payer: Self-pay | Admitting: Cardiovascular Disease

## 2020-10-15 DIAGNOSIS — Z9189 Other specified personal risk factors, not elsewhere classified: Secondary | ICD-10-CM

## 2020-10-15 NOTE — Telephone Encounter (Signed)
Dr. Hal Hope from the patient's PCP is calling wanting to know if Xarelto needs to be held or bridged for this procedure. She is requesting a callback to discuss as well as a fax with the information. You can contact her by cell outside of office hours. You can reach her at her office number Mon-Fri 8-5. The fax number she wants this faxed to for her records is [phone]. Please advise.

## 2020-10-15 NOTE — Telephone Encounter (Signed)
Error

## 2020-10-15 NOTE — Telephone Encounter (Signed)
DVT diagnosed 09/15/17 - superficial occlusive clot in greater saphenous vein in left thigh on lower extremity doppler. She had undergone a procedure on her right knee 2 months prior with limited mobility after which was felt to contribute to her DVT. She was sent for CT angiogram 09/15/17 which showed PE with right heart strain 09/16/17. Plan was to treat with Xarelto for at least 6 months, unclear of treatment end date but seemed to be January 2020. CT angiogram checked again 11/11/18 due to SOB and tachycardia which showed acute PE affecting all lobes in the right lung, pt now on indefinite anticoagulation due to recurrent PEs.   PCP has been managing Xarelto. Typically hold Xarelto for 3 days prior to spinal procedures, however pt is at elevated risk off of anticoagulation due to recurrent PEs. Could either hold Xarelto for 2 or 3 days, or could bridge with Lovenox in the interim period before her procedure. This decision is physician-determined and doesn't fall into the PharmD bridging protocol.

## 2020-10-19 NOTE — Telephone Encounter (Signed)
I will forward clearance notes to PCP per pre op provider today. Will also send to requesting office.

## 2020-10-19 NOTE — Telephone Encounter (Signed)
Patient has received recommendations based off preoperative cardiac evaluation.  Clinical pharmacist has reviewed patient's Xarelto and made recommendations for PCP guidance.  Preop team please forward recommendations to PCP so that she may provide final guidance on Xarelto.   Thank you,  Thomasene Ripple. Sowmya [name] NP-C    10/19/2020, 10:57 AM [facility] [address] Office [phone] Fax [phone]

## 2020-10-24 ENCOUNTER — Other Ambulatory Visit (HOSPITAL_COMMUNITY): Payer: No Typology Code available for payment source

## 2020-10-29 ENCOUNTER — Other Ambulatory Visit: Payer: Self-pay | Admitting: Orthopedic Surgery

## 2020-10-29 ENCOUNTER — Other Ambulatory Visit (HOSPITAL_COMMUNITY): Payer: No Typology Code available for payment source

## 2020-10-31 ENCOUNTER — Ambulatory Visit: Admit: 2020-10-31 | Payer: No Typology Code available for payment source | Admitting: Orthopedic Surgery

## 2020-10-31 SURGERY — LUMBAR LAMINECTOMY/DECOMPRESSION MICRODISCECTOMY
Anesthesia: General | Laterality: Left

## 2020-11-05 ENCOUNTER — Other Ambulatory Visit: Payer: Self-pay

## 2020-11-05 ENCOUNTER — Encounter (HOSPITAL_COMMUNITY): Payer: Self-pay

## 2020-11-05 ENCOUNTER — Emergency Department (HOSPITAL_COMMUNITY): Payer: Commercial Managed Care - PPO

## 2020-11-05 ENCOUNTER — Inpatient Hospital Stay (HOSPITAL_COMMUNITY)
Admission: EM | Admit: 2020-11-05 | Discharge: 2020-11-12 | DRG: 518 | Disposition: A | Discharge status: Home / Self Care | Payer: Commercial Managed Care - PPO | Attending: Orthopedic Surgery | Admitting: Orthopedic Surgery

## 2020-11-05 DIAGNOSIS — I824Y2 Acute embolism and thrombosis of unspecified deep veins of left proximal lower extremity: Secondary | ICD-10-CM | POA: Diagnosis not present

## 2020-11-05 DIAGNOSIS — Z419 Encounter for procedure for purposes other than remedying health state, unspecified: Secondary | ICD-10-CM

## 2020-11-05 DIAGNOSIS — I82452 Acute embolism and thrombosis of left peroneal vein: Secondary | ICD-10-CM | POA: Diagnosis not present

## 2020-11-05 DIAGNOSIS — M549 Dorsalgia, unspecified: Secondary | ICD-10-CM | POA: Diagnosis not present

## 2020-11-05 DIAGNOSIS — M5117 Intervertebral disc disorders with radiculopathy, lumbosacral region: Secondary | ICD-10-CM | POA: Diagnosis present

## 2020-11-05 DIAGNOSIS — E1141 Type 2 diabetes mellitus with diabetic mononeuropathy: Secondary | ICD-10-CM | POA: Diagnosis present

## 2020-11-05 DIAGNOSIS — Z79899 Other long term (current) drug therapy: Secondary | ICD-10-CM

## 2020-11-05 DIAGNOSIS — J45902 Unspecified asthma with status asthmaticus: Secondary | ICD-10-CM | POA: Diagnosis not present

## 2020-11-05 DIAGNOSIS — I2693 Single subsegmental pulmonary embolism without acute cor pulmonale: Secondary | ICD-10-CM | POA: Diagnosis not present

## 2020-11-05 DIAGNOSIS — Z6841 Body Mass Index (BMI) 40.0 and over, adult: Secondary | ICD-10-CM | POA: Diagnosis not present

## 2020-11-05 DIAGNOSIS — R0602 Shortness of breath: Secondary | ICD-10-CM

## 2020-11-05 DIAGNOSIS — Z86718 Personal history of other venous thrombosis and embolism: Secondary | ICD-10-CM

## 2020-11-05 DIAGNOSIS — F4310 Post-traumatic stress disorder, unspecified: Secondary | ICD-10-CM | POA: Diagnosis present

## 2020-11-05 DIAGNOSIS — I712 Thoracic aortic aneurysm, without rupture: Secondary | ICD-10-CM | POA: Diagnosis present

## 2020-11-05 DIAGNOSIS — M5126 Other intervertebral disc displacement, lumbar region: Secondary | ICD-10-CM | POA: Diagnosis not present

## 2020-11-05 DIAGNOSIS — Z86711 Personal history of pulmonary embolism: Secondary | ICD-10-CM

## 2020-11-05 DIAGNOSIS — J9601 Acute respiratory failure with hypoxia: Secondary | ICD-10-CM | POA: Diagnosis not present

## 2020-11-05 DIAGNOSIS — I2699 Other pulmonary embolism without acute cor pulmonale: Secondary | ICD-10-CM | POA: Diagnosis not present

## 2020-11-05 DIAGNOSIS — Z7901 Long term (current) use of anticoagulants: Secondary | ICD-10-CM | POA: Diagnosis not present

## 2020-11-05 DIAGNOSIS — Z882 Allergy status to sulfonamides status: Secondary | ICD-10-CM

## 2020-11-05 DIAGNOSIS — G4733 Obstructive sleep apnea (adult) (pediatric): Secondary | ICD-10-CM | POA: Diagnosis present

## 2020-11-05 DIAGNOSIS — M79606 Pain in leg, unspecified: Secondary | ICD-10-CM | POA: Diagnosis not present

## 2020-11-05 DIAGNOSIS — Z8616 Personal history of COVID-19: Secondary | ICD-10-CM | POA: Diagnosis not present

## 2020-11-05 DIAGNOSIS — G2581 Restless legs syndrome: Secondary | ICD-10-CM | POA: Diagnosis present

## 2020-11-05 DIAGNOSIS — M5410 Radiculopathy, site unspecified: Secondary | ICD-10-CM | POA: Diagnosis present

## 2020-11-05 LAB — CBC WITH DIFFERENTIAL/PLATELET
Abs Immature Granulocytes: 0.13 10*3/uL — ABNORMAL HIGH (ref 0.00–0.07)
Basophils Absolute: 0 10*3/uL (ref 0.0–0.1)
Basophils Relative: 0 %
Eosinophils Absolute: 0 10*3/uL (ref 0.0–0.5)
Eosinophils Relative: 0 %
HCT: 43.7 % (ref 36.0–46.0)
Hemoglobin: 13.7 g/dL (ref 12.0–15.0)
Immature Granulocytes: 1 %
Lymphocytes Relative: 19 %
Lymphs Abs: 2.1 10*3/uL (ref 0.7–4.0)
MCH: 28.7 pg (ref 26.0–34.0)
MCHC: 31.4 g/dL (ref 30.0–36.0)
MCV: 91.6 fL (ref 80.0–100.0)
Monocytes Absolute: 0.5 10*3/uL (ref 0.1–1.0)
Monocytes Relative: 4 %
Neutro Abs: 8.7 10*3/uL — ABNORMAL HIGH (ref 1.7–7.7)
Neutrophils Relative %: 76 %
Platelets: 272 10*3/uL (ref 150–400)
RBC: 4.77 MIL/uL (ref 3.87–5.11)
RDW: 14.4 % (ref 11.5–15.5)
WBC: 11.5 10*3/uL — ABNORMAL HIGH (ref 4.0–10.5)
nRBC: 0 % (ref 0.0–0.2)

## 2020-11-05 LAB — RESP PANEL BY RT-PCR (FLU A&B, COVID) ARPGX2
Influenza A by PCR: NEGATIVE
Influenza B by PCR: NEGATIVE
SARS Coronavirus 2 by RT PCR: POSITIVE — AB

## 2020-11-05 LAB — BASIC METABOLIC PANEL
Anion gap: 9 (ref 5–15)
BUN: 20 mg/dL (ref 6–20)
CO2: 26 mmol/L (ref 22–32)
Calcium: 9.5 mg/dL (ref 8.9–10.3)
Chloride: 106 mmol/L (ref 98–111)
Creatinine, Ser: 0.93 mg/dL (ref 0.44–1.00)
GFR, Estimated: >60 mL/min (ref >60)
Glucose, Bld: 115 mg/dL — ABNORMAL HIGH (ref 70–99)
Potassium: 4.4 mmol/L (ref 3.5–5.1)
Sodium: 141 mmol/L (ref 135–145)

## 2020-11-05 MED ORDER — HYDROMORPHONE HCL 1 MG/ML IJ SOLN
1.0000 mg | Freq: Once | INTRAMUSCULAR | Status: AC
  Administered 2020-11-05: 1 mg via INTRAVENOUS
  Filled 2020-11-05: qty 1

## 2020-11-05 MED ORDER — METHOCARBAMOL 500 MG PO TABS
500.0000 mg | ORAL_TABLET | Freq: Four times a day (QID) | ORAL | Status: DC | PRN
  Administered 2020-11-05 – 2020-11-08 (×6): 500 mg via ORAL
  Filled 2020-11-05 (×6): qty 1

## 2020-11-05 MED ORDER — LORAZEPAM 2 MG/ML IJ SOLN
0.5000 mg | Freq: Once | INTRAMUSCULAR | Status: AC
  Administered 2020-11-05: 0.5 mg via INTRAVENOUS
  Filled 2020-11-05: qty 1

## 2020-11-05 MED ORDER — OXYCODONE-ACETAMINOPHEN 5-325 MG PO TABS
1.0000 | ORAL_TABLET | ORAL | Status: DC | PRN
  Administered 2020-11-05: 1 via ORAL
  Administered 2020-11-05 – 2020-11-08 (×6): 2 via ORAL
  Filled 2020-11-05 (×4): qty 2
  Filled 2020-11-05 (×2): qty 1
  Filled 2020-11-05: qty 2
  Filled 2020-11-05: qty 1

## 2020-11-05 NOTE — ED Triage Notes (Signed)
Patient complains of ongoing back pain radiating down left leg. States sent from ortho for MRI. Denies any new trauma

## 2020-11-05 NOTE — ED Notes (Signed)
Patient eating dinner tray. Denies any needs at this time

## 2020-11-05 NOTE — ED Provider Notes (Signed)
[facility] EMERGENCY DEPARTMENT Provider Note   CSN: [id number] Arrival date & time: 11/05/20  1032     History No chief complaint on file.   Erika Woods is a 55 y.o. female.  HPI  55 year old female with past medical history of previous PE/DVT anticoagulated on Xarelto, DM, thoracic aortic aneurysm presents to the emergency department with debilitating left leg pain.  Of note she has been following as an outpatient with orthopedics, Dr. Yevette Edwards.  She has known L5-S1 herniation.  She was originally planned for surgery about a month ago but this was delayed secondary to COVID.  Patient states over the last week her pain has become debilitating, she is now nonambulatory and using a wheelchair.  Denies any saddle anesthesia or incontinence but she is having worsening numbness/tingling in the left lower extremity.  She saw orthopedics in the office today and due to her significant debilitation they recommended that she come to the ER for MRI and more emergent surgical intervention.  Past Medical History:  Diagnosis Date   Acute pulmonary embolism ([name]) 11/11/2018   Acute pulmonary embolism with acute cor pulmonale ([name]) 09/16/2017   Acute superficial venous thrombosis of left lower extremity    Aortic insufficiency    Echocardiogram 08/2019: prob bicuspid AoV, mild to mod AI, mild AS (mean 12 mmHg), EF 55-60, no RWMA, Gr 1 DD, GLS -22.2%, normal RVSF, Ao Root 39 mm   Coronary CTA    Coronary CTA 08/2019: Calcium score 0, no evidence of CAD, ascending aorta 4 cm   Diabetes mellitus without complication ([name])    DM (diabetes mellitus) ([name]) 08/18/2011   Impaired fasting glucose    Mood disorder ([name]) 08/18/2011   Obesity, Class III, BMI 40-49.9 (morbid obesity) ([name]) 09/16/2017   Obstructive sleep apnea on CPAP    Osteoarthritis of knee    Personal history of pulmonary embolism    PTSD (post-traumatic stress disorder) 08/18/2011   Pulmonary emboli ([name]) 09/15/2017   Renal  disorder    kideny stones   Restless leg syndrome    Severe recurrent major depression without psychotic features ([name]) 02/17/2018   Sleep apnea 08/18/2011   Thoracic aortic aneurysm [facility])    Thoracic aortic aneurysm without rupture [facility])     Patient Active Problem List   Diagnosis Date Noted   COVID-19 virus detected 06/07/2019   Acute pulmonary embolism ([name]) 11/11/2018   Severe recurrent major depression without psychotic features ([name]) 02/17/2018   Obesity, Class III, BMI 40-49.9 (morbid obesity) ([name]) 09/16/2017   Acute pulmonary embolism with acute cor pulmonale ([name]) 09/16/2017   Acute superficial venous thrombosis of left lower extremity    Thoracic aortic aneurysm without rupture ([name])    Pulmonary emboli ([name]) 09/15/2017   PTSD (post-traumatic stress disorder) 08/18/2011   DM (diabetes mellitus) ([name]) 08/18/2011   Sleep apnea 08/18/2011   Mood disorder ([name]) 08/18/2011    Past Surgical History:  Procedure Laterality Date   arm surgery Bilateral Skin removal   DILATATION & CURETTAGE/HYSTEROSCOPY WITH MYOSURE N/A 06/11/2018   Procedure: DILATATION & CURETTAGE/HYSTEROSCOPY WITH MYOSURE RESECTION OF ENDOMETRIAL THICKENING;  Surgeon: Richardean Chimera, MD;  Location: [facility];  Service: Gynecology;  Laterality: N/A;  BMI 56   DILATION AND EVACUATION     ESOPHAGOGASTRODUODENOSCOPY (EGD) WITH PROPOFOL N/A 07/15/2018   Procedure: ESOPHAGOGASTRODUODENOSCOPY (EGD) WITH PROPOFOL;  Surgeon: Willis Modena, MD;  Location: [facility];  Service: Endoscopy;  Laterality: N/A;   ESOPHAGOGASTRODUODENOSCOPY (EGD) WITH PROPOFOL N/A 04/27/2019   Procedure:  ESOPHAGOGASTRODUODENOSCOPY (EGD) WITH PROPOFOL;  Surgeon: Willis Modena, MD;  Location: [facility];  Service: Endoscopy;  Laterality: N/A;   IR ABLATE LIVER CRYOABLATION  10/28/2019   IR ABLATE LIVER CRYOABLATION  05/07/2020   IR RADIOLOGIST EVAL & MGMT  10/14/2019   IR RADIOLOGIST EVAL & MGMT  04/03/2020   KIDNEY STONE SURGERY     KNEE SURGERY  Right    TONSILLECTOMY       OB History   No obstetric history on file.     Family History  Adopted: Yes    Social History   Tobacco Use   Smoking status: Never   Smokeless tobacco: Never  Vaping Use   Vaping Use: Never used  Substance Use Topics   Alcohol use: Yes    Comment: "wine once or twice a month"   Drug use: No    Home Medications Prior to Admission medications   Medication Sig Start Date End Date Taking? Authorizing Provider  albuterol (PROVENTIL HFA;VENTOLIN HFA) 108 (90 Base) MCG/ACT inhaler Inhale 1-2 puffs into the lungs every 6 (six) hours as needed for wheezing or shortness of breath. 02/06/18   Arby Barrette, MD  doxepin (SINEQUAN) 10 MG capsule Take by mouth. 03/12/20   [provider]  fluticasone (FLONASE) 50 MCG/ACT nasal spray Place 1 spray into both nostrils 2 (two) times daily. 06/09/19   [provider]  gabapentin (NEURONTIN) 300 MG capsule Take 300 mg by mouth 3 (three) times daily. 04/01/20   [provider]  lamoTRIgine (LAMICTAL) 150 MG tablet Take 150 mg by mouth daily after breakfast.  06/30/18   [provider]  lansoprazole (PREVACID) 15 MG capsule Take 15 mg by mouth daily as needed (acid reflux).    [provider]  metFORMIN (GLUCOPHAGE) 500 MG tablet Take 500 mg by mouth 2 (two) times daily with a meal.  10/28/13   [provider]  oxymetazoline (AFRIN) 0.05 % nasal spray Place 1 spray into both nostrils at bedtime.    [provider]  rivaroxaban (XARELTO) 20 MG TABS tablet Take 20 mg by mouth daily after breakfast.     [provider]  rOPINIRole (REQUIP XL) 4 MG 24 hr tablet Take 4-12 mg by mouth at bedtime as needed (restless leg).     [provider]  traMADol (ULTRAM) 50 MG tablet Take 50-100 mg by mouth daily. 07/29/19   [provider]  TRINTELLIX 20 MG TABS tablet Take 20 mg by mouth daily. 05/13/19   [provider]    Allergies     Sulfa antibiotics  Review of Systems   Review of Systems  Constitutional:  Negative for chills and fever.  HENT:  Negative for congestion.   Eyes:  Negative for visual disturbance.  Respiratory:  Negative for shortness of breath.   Cardiovascular:  Negative for chest pain.  Gastrointestinal:  Negative for abdominal pain, diarrhea and vomiting.  Genitourinary:  Negative for dysuria.  Musculoskeletal:  Positive for back pain.       + Left lower extremity sharp radiating pain, scattered numbness/tingling, no foot discoloration.   Skin:  Negative for rash.  Neurological:  Negative for headaches.   Physical Exam Updated Vital Signs BP 138/75 (BP Location: Left Arm)   Pulse 70   Temp 99.4 F (37.4 C) (Oral)   Resp 18   LMP 01/29/2013   SpO2 98%   Physical Exam Vitals and nursing note reviewed.  Constitutional:      Appearance: Normal appearance.  She is obese.  HENT:     Head: Normocephalic.     Mouth/Throat:     Mouth: Mucous membranes are moist.  Cardiovascular:     Rate and Rhythm: Normal rate.  Pulmonary:     Effort: Pulmonary effort is normal. No respiratory distress.  Abdominal:     Palpations: Abdomen is soft.     Tenderness: There is no abdominal tenderness.  Musculoskeletal:     Comments: No leg discoloration, palpable DP pulse, strength is slightly decreased, scattered areas of numbness, pain with any manipulation of the left lower extremity  Skin:    General: Skin is warm.  Neurological:     Mental Status: She is alert and oriented to person, place, and time. Mental status is at baseline.  Psychiatric:        Mood and Affect: Mood normal.    ED Results / Procedures / Treatments   Labs (all labs ordered are listed, but only abnormal results are displayed) Labs Reviewed  RESP PANEL BY RT-PCR (FLU A&B, COVID) [id number]  CBC WITH DIFFERENTIAL/PLATELET  BASIC METABOLIC PANEL    EKG None  Radiology No results found.  Procedures Procedures    Medications Ordered in ED Medications  HYDROmorphone (DILAUDID) injection 1 mg (has no administration in time range)  LORazepam (ATIVAN) injection 0.5 mg (0.5 mg Intravenous Given 11/05/20 1342)    ED Course  I have reviewed the triage vital signs and the nursing notes.  Pertinent labs & imaging results that were available during my care of the patient were reviewed by me and considered in my medical decision making (see chart for details).    MDM Rules/Calculators/A&P                          55 year old female with known L5-S1 disease presents the emergency departments with debility secondary to back and leg pain.  She was seen in the orthopedic office today, referred for worsening pain for repeat MRI and most likely admission for more emergent surgical intervention that has otherwise been delayed due to COVID illness.  MRI shows chronic changes with mild worsening.  Patient has baseline numbness and tingling of the lower extremity but otherwise appears neuro intact.  She is nonambulatory secondary to the pain.  We will control her pain, admit to orthopedics.  Patients evaluation and results requires admission for further treatment and care. Patient agrees with admission plan, offers no new complaints and is stable/unchanged at time of admit.  Final Clinical Impression(s) / ED Diagnoses Final diagnoses:  None    Rx / DC Orders ED Discharge Orders     None        Rozelle Logan, DO 11/05/20 1702

## 2020-11-05 NOTE — Progress Notes (Signed)
Of note, patient presented to my office today with increasing debilitating pain in her left leg.  She was previously noted to have a left-sided L5-S1 disc herniation, and was scheduled to have surgery for this.  Unfortunately, she tested positive for Covid, and we had to delay surgery. Surgery was scheduled for 1 month from today.  However, today, she did state that for the past 5 days, her pain increased severely, and that she is now in writhing pain, and is unable to routine activities of daily living, including simply making it to the restroom. I did then recommend that she present to the emergency department, for an updated MRI, and subsequently, likely admission to the floor for pain control and ultimately, surgical intervention.  I did express to the patient that I personally am unable to perform the surgery that she needs this week, but that given her substantial pain, she does in fact need surgery this week.  I then reached out to a trusted colleague, Dr. Venita Lick, and he did state that he may be available to perform her surgery, as early as Wednesday, 2 days from today. I did share this with the patient, and she is very appreciative of his willingness to help her, as am I. I will continue to keep an eye on her hospital course and her updated MRI findings, and will continue to be in touch with Dr. Shon Baton.

## 2020-11-05 NOTE — Consult Note (Signed)
Erika Woods is an 55 y.o. female.  HPI: Patient presented to ED with debilitating left leg pain, as instructed by me. An updated MRI reveals a left L5/S1 HNP. Patient is unable to care for herself at home due to her pain. Pain is constant and stabbing.  Past Medical History:  Diagnosis Date   Acute pulmonary embolism ([name]) 11/11/2018   Acute pulmonary embolism with acute cor pulmonale ([name]) 09/16/2017   Acute superficial venous thrombosis of left lower extremity    Aortic insufficiency    Echocardiogram 08/2019: prob bicuspid AoV, mild to mod AI, mild AS (mean 12 mmHg), EF 55-60, no RWMA, Gr 1 DD, GLS -22.2%, normal RVSF, Ao Root 39 mm   Coronary CTA    Coronary CTA 08/2019: Calcium score 0, no evidence of CAD, ascending aorta 4 cm   Diabetes mellitus without complication ([name])    DM (diabetes mellitus) ([name]) 08/18/2011   Impaired fasting glucose    Mood disorder ([name]) 08/18/2011   Obesity, Class III, BMI 40-49.9 (morbid obesity) ([name]) 09/16/2017   Obstructive sleep apnea on CPAP    Osteoarthritis of knee    Personal history of pulmonary embolism    PTSD (post-traumatic stress disorder) 08/18/2011   Pulmonary emboli ([name]) 09/15/2017   Renal disorder    kideny stones   Restless leg syndrome    Severe recurrent major depression without psychotic features ([name]) 02/17/2018   Sleep apnea 08/18/2011   Thoracic aortic aneurysm [facility])    Thoracic aortic aneurysm without rupture [facility])     Past Surgical History:  Procedure Laterality Date   arm surgery Bilateral Skin removal   DILATATION & CURETTAGE/HYSTEROSCOPY WITH MYOSURE N/A 06/11/2018   Procedure: DILATATION & CURETTAGE/HYSTEROSCOPY WITH MYOSURE RESECTION OF ENDOMETRIAL THICKENING;  Surgeon: Richardean Chimera, MD;  Location: [facility];  Service: Gynecology;  Laterality: N/A;  BMI 56   DILATION AND EVACUATION     ESOPHAGOGASTRODUODENOSCOPY (EGD) WITH PROPOFOL N/A 07/15/2018   Procedure: ESOPHAGOGASTRODUODENOSCOPY (EGD) WITH PROPOFOL;  Surgeon: Willis Modena,  MD;  [facility];  Service: Endoscopy;  Laterality: N/A;   ESOPHAGOGASTRODUODENOSCOPY (EGD) WITH PROPOFOL N/A 04/27/2019   Procedure: ESOPHAGOGASTRODUODENOSCOPY (EGD) WITH PROPOFOL;  Surgeon: Willis Modena, MD;  [facility];  Service: Endoscopy;  Laterality: N/A;   IR ABLATE LIVER CRYOABLATION  10/28/2019   IR ABLATE LIVER CRYOABLATION  05/07/2020   IR RADIOLOGIST EVAL & MGMT  10/14/2019   IR RADIOLOGIST EVAL & MGMT  04/03/2020   KIDNEY STONE SURGERY     KNEE SURGERY Right    TONSILLECTOMY      Family History  Adopted: Yes    Social History:  reports that she has never smoked. She has never used smokeless tobacco. She reports current alcohol use. She reports that she does not use drugs.  Allergies:  Allergies  Allergen Reactions   Sulfa Antibiotics Hives    Medications: Prior to Admission: (Not in a hospital admission)   Results for orders placed or performed during the hospital encounter of 11/05/20 (from the past 48 hour(s))  CBC with Differential     Status: Abnormal   Collection Time: 11/05/20  1:14 PM  Result Value Ref Range   WBC 11.5 (H) 4.0 - 10.5 K/uL   RBC 4.77 3.87 - 5.11 MIL/uL   Hemoglobin 13.7 12.0 - 15.0 g/dL   HCT 29.5 28.4 - 13.2 %   MCV 91.6 80.0 - 100.0 fL   MCH 28.7 26.0 - 34.0 pg   MCHC 31.4 30.0 - 36.0 g/dL  RDW 14.4 11.5 - 15.5 %   Platelets 272 150 - 400 K/uL   nRBC 0.0 0.0 - 0.2 %   Neutrophils Relative % 76 %   Neutro Abs 8.7 (H) 1.7 - 7.7 K/uL   Lymphocytes Relative 19 %   Lymphs Abs 2.1 0.7 - 4.0 K/uL   Monocytes Relative 4 %   Monocytes Absolute 0.5 0.1 - 1.0 K/uL   Eosinophils Relative 0 %   Eosinophils Absolute 0.0 0.0 - 0.5 K/uL   Basophils Relative 0 %   Basophils Absolute 0.0 0.0 - 0.1 K/uL   Immature Granulocytes 1 %   Abs Immature Granulocytes 0.13 (H) 0.00 - 0.07 K/uL    Comment: Performed at [facility], [address] [place], [place] [address]  Basic metabolic panel     Status: Abnormal    Collection Time: 11/05/20  1:14 PM  Result Value Ref Range   Sodium 141 135 - 145 mmol/L   Potassium 4.4 3.5 - 5.1 mmol/L   Chloride 106 98 - 111 mmol/L   CO2 26 22 - 32 mmol/L   Glucose, Bld 115 (H) 70 - 99 mg/dL    Comment: Glucose reference range applies only to samples taken after fasting for at least 8 hours.   BUN 20 6 - 20 mg/dL   Creatinine, Ser 2.37 0.44 - 1.00 mg/dL   Calcium 9.5 8.9 - 62.8 mg/dL   GFR, Estimated >31 >51 mL/min    Comment: (NOTE) Calculated using the CKD-EPI Creatinine Equation (2021)    Anion gap 9 5 - 15    Comment: Performed at [facility], [address], [place], [place] [address]  Resp Panel by RT-PCR (Flu A&B, Covid) Nasopharyngeal Swab     Status: Abnormal   Collection Time: 11/05/20  1:14 PM   Specimen: Nasopharyngeal Swab; Nasopharyngeal(NP) swabs in vial transport medium  Result Value Ref Range   SARS Coronavirus 2 by RT PCR POSITIVE (A) NEGATIVE    Comment: RESULT CALLED TO, READ BACK BY AND VERIFIED WITH: Carma Leaven RN 1450 11/05/20 A BROWNNG (NOTE) SARS-CoV-2 target nucleic acids are DETECTED.  The SARS-CoV-2 RNA is generally detectable in upper respiratory specimens during the acute phase of infection. Positive results are indicative of the presence of the identified virus, but do not rule out bacterial infection or co-infection with other pathogens not detected by the test. Clinical correlation with patient history and other diagnostic information is necessary to determine patient infection status. The expected result is Negative.  Fact Sheet for Patients: BloggerCourse.com  Fact Sheet for Healthcare Providers: SeriousBroker.it  This test is not yet approved or cleared by the [place] FDA and  has been authorized for detection and/or diagnosis of SARS-CoV-2 by FDA under an Emergency Use Authorization (EUA).  This EUA will remain in effect (meaning this test can  be used)  for the duration of  the COVID-19 declaration under Section 564(b)(1) of the Act, 21 U.S.C. section 360bbb-3(b)(1), unless the authorization is terminated or revoked sooner.     Influenza A by PCR NEGATIVE NEGATIVE   Influenza B by PCR NEGATIVE NEGATIVE    Comment: (NOTE) The Xpert Xpress SARS-CoV-2/FLU/RSV plus assay is intended as an aid in the diagnosis of influenza from Nasopharyngeal swab specimens and should not be used as a sole basis for treatment. Nasal washings and aspirates are unacceptable for Xpert Xpress SARS-CoV-2/FLU/RSV testing.  Fact Sheet for Patients: BloggerCourse.com  Fact Sheet for Healthcare Providers: SeriousBroker.it  This test  is not yet approved or cleared by the [place] and has been authorized for detection and/or diagnosis of SARS-CoV-2 by FDA under an Emergency Use Authorization (EUA). This EUA will remain in effect (meaning this test can be used) for the duration of the COVID-19 declaration under Section 564(b)(1) of the Act, 21 U.S.C. section 360bbb-3(b)(1), unless the authorization is terminated or revoked.  Performed at [facility], [address] [place], [place] [address]     MR LUMBAR SPINE WO CONTRAST  Result Date: 11/05/2020 CLINICAL DATA:  Low back pain with left leg pain. EXAM: MRI LUMBAR SPINE WITHOUT CONTRAST TECHNIQUE: Multiplanar, multisequence MR imaging of the lumbar spine was performed. No intravenous contrast was administered. COMPARISON:  MRI lumbar spine 09/30/2020 FINDINGS: Segmentation:  Standard Alignment: Slight retrolisthesis L3-4. Slight anterolisthesis L4-5. Vertebrae:  Negative for fracture or mass Conus medullaris and cauda equina: Conus extends to the L1 level. Conus and cauda equina appear normal. Paraspinal and other soft tissues: Negative for paraspinous mass, adenopathy, or fluid collection. Disc levels: L1-2: Normal disc space with moderate facet  degeneration. Negative for stenosis L2-3: Mild disc bulging and moderate facet degeneration. Mild spinal stenosis. L3-4: Mild disc bulging and moderate facet degeneration. Mild spinal stenosis and mild subarticular stenosis bilaterally. L4-5: Mild disc degeneration and disc bulging. Severe facet degeneration with bony overgrowth and bilateral facet joint effusions. Moderate to severe spinal stenosis. Moderate subarticular stenosis bilaterally. No interval change L5-S1: Central and left-sided disc protrusion with left S1 nerve root impingement. There is also right subarticular stenosis with less prominent right S1 nerve root impingement. No interval change from the prior study. IMPRESSION: Mild spinal stenosis L2-3 and L3-4 Moderate to severe spinal stenosis with moderate subarticular stenosis bilaterally L4-5. Severe bilateral facet degeneration. No interval change Central left-sided disc protrusion L5-S1 unchanged. There is bilateral S1 nerve root impingement left greater than right Electronically Signed   By: Marlan Palau M.D.   On: 11/05/2020 16:01    Review of Systems Blood pressure 110/81, pulse 75, temperature 99.4 F (37.4 C), temperature source Oral, resp. rate 14, last menstrual period 01/29/2013, SpO2 96 %. Physical Exam  Assessment/Plan: Severe left S1 radiculopathy due to a moderate left L5/S1 HNP, compressing the left S1 nerve.   As previously noted, patient will be admitted to the floor for pain control and will be evaluated by a trusted colleague tomorrow, Dr. Venita Lick, as the patient requires surgical intervention that I am unfortunately unable to provide for her this week. Patient has been made aware. I am greatly appreciative of his assistance in helping Ms. Cuffe, as she is currently miserable, with disabling, constant pain. Plan currently is tentatively set for surgery Wednesday with Dr. Shon Baton. I will continue to follow her progress.   Kwinton Maahs L Jailah Willis 11/05/2020, 8:41 PM

## 2020-11-05 NOTE — ED Notes (Signed)
Patient transported to MRI 

## 2020-11-06 LAB — SURGICAL PCR SCREEN
MRSA, PCR: NEGATIVE
Staphylococcus aureus: POSITIVE — AB

## 2020-11-06 MED ORDER — GABAPENTIN 600 MG PO TABS
600.0000 mg | ORAL_TABLET | Freq: Three times a day (TID) | ORAL | Status: DC
  Administered 2020-11-06 – 2020-11-08 (×6): 600 mg via ORAL
  Filled 2020-11-06 (×6): qty 1

## 2020-11-06 MED ORDER — MUPIROCIN 2 % EX OINT
1.0000 "application " | TOPICAL_OINTMENT | Freq: Two times a day (BID) | CUTANEOUS | Status: DC
  Administered 2020-11-06 – 2020-11-08 (×3): 1 via NASAL
  Filled 2020-11-06 (×2): qty 22

## 2020-11-06 MED ORDER — MORPHINE SULFATE (PF) 2 MG/ML IV SOLN: 2.0000 mg | Freq: Once | INTRAVENOUS | Status: DC

## 2020-11-06 MED ORDER — MORPHINE SULFATE (PF) 2 MG/ML IV SOLN
1.0000 mg | INTRAVENOUS | Status: DC | PRN
Start: 2020-11-06 — End: 2020-11-08
  Administered 2020-11-06 – 2020-11-08 (×10): 2 mg via INTRAVENOUS
  Filled 2020-11-06 (×11): qty 1

## 2020-11-06 NOTE — Progress Notes (Signed)
    Patient feels slightly better than yesterday, but does continue to have significant left buttock and posterior thigh pain   Physical Exam: Vitals:   11/06/20 0749 11/06/20 1112  BP: 132/65 121/87  Pulse: 63 64  Resp: 13 14  Temp: 97.9 F (36.6 C) 98.1 F (36.7 C)  SpO2: 98% 96%   Patient appears more comfortable than yesterday Patient does continue to have a positive straight leg raise on the left  Patient with ongoing left S1 radiculopathy secondary to a left L5-S1 disc herniation.  The patient's updated MRI does appear very similar to her previous MRI, and does continue to compress the traversing left S1 nerve.  -We will continue with p.o. and IV pain medication.  Patient has found IV morphine to be more beneficial. -Plan remains the same, to treat the patient with pain medication until Dr. Shon Baton is able to proceed with a microdiscectomy, which is tentatively slated to occur tomorrow.  Once again, the patient and myself are very appreciative of his assistance.

## 2020-11-06 NOTE — Plan of Care (Signed)

## 2020-11-07 MED ORDER — VORTIOXETINE HBR 20 MG PO TABS
20.0000 mg | ORAL_TABLET | Freq: Every day | ORAL | Status: DC
  Administered 2020-11-07 – 2020-11-08 (×2): 20 mg via ORAL
  Filled 2020-11-07 (×2): qty 1

## 2020-11-07 MED ORDER — DEXTROMETHORPHAN POLISTIREX ER 30 MG/5ML PO SUER
15.0000 mg | ORAL | Status: DC | PRN
  Filled 2020-11-07: qty 5

## 2020-11-07 MED ORDER — FLUTICASONE FUROATE-VILANTEROL 100-25 MCG/INH IN AEPB
1.0000 | INHALATION_SPRAY | Freq: Every day | RESPIRATORY_TRACT | Status: DC
  Filled 2020-11-07: qty 28

## 2020-11-07 MED ORDER — BUDESON-GLYCOPYRROL-FORMOTEROL 160-9-4.8 MCG/ACT IN AERO: 2.0000 | INHALATION_SPRAY | Freq: Two times a day (BID) | RESPIRATORY_TRACT | Status: DC | PRN

## 2020-11-07 MED ORDER — SENNA 8.6 MG PO TABS
1.0000 | ORAL_TABLET | Freq: Every evening | ORAL | Status: DC | PRN
  Administered 2020-11-07: 8.6 mg via ORAL
  Filled 2020-11-07: qty 1

## 2020-11-07 MED ORDER — DOXEPIN HCL 50 MG PO CAPS
50.0000 mg | ORAL_CAPSULE | Freq: Every day | ORAL | Status: DC
  Administered 2020-11-07: 50 mg via ORAL
  Filled 2020-11-07 (×2): qty 1

## 2020-11-07 MED ORDER — LAMOTRIGINE 100 MG PO TABS
150.0000 mg | ORAL_TABLET | Freq: Every day | ORAL | Status: DC
  Administered 2020-11-07 – 2020-11-08 (×2): 150 mg via ORAL
  Filled 2020-11-07 (×2): qty 2

## 2020-11-07 MED ORDER — ROPINIROLE HCL 1 MG PO TABS
8.0000 mg | ORAL_TABLET | Freq: Every day | ORAL | Status: DC
  Administered 2020-11-07: 8 mg via ORAL
  Filled 2020-11-07: qty 8

## 2020-11-07 MED ORDER — UMECLIDINIUM BROMIDE 62.5 MCG/INH IN AEPB
1.0000 | INHALATION_SPRAY | Freq: Every day | RESPIRATORY_TRACT | Status: DC
  Filled 2020-11-07: qty 7

## 2020-11-07 NOTE — Consult Note (Signed)
Chief Complaint: Acute L5-S1 left-sided disc herniation with worsening S1 radiculopathy  History: Finnlee was previously noted to have a left-sided L5-S1 disc herniation, and was scheduled to have surgery for this.  Unfortunately, she tested positive for Covid, and had to delay surgery. Surgery was scheduled for 1 month from today.  However, today, she did state that for the past 5 days, her pain increased severely, and that she is now in writhing pain, and is unable to routine activities of daily living, including simply making it to the restroom.  Her treating surgeon then recommend that she present to the emergency department, for an updated MRI, and subsequently, likely admission to the floor for pain control and ultimately, surgical intervention.  Dr. Yevette Edwards contacted me and asked that I perform her surgery,   Review of systems: Positive history of sleep apnea on CPAP.  No chest pain, loss of consciousness, nausea, emesis.  No recent fevers, chills, weight loss. \ Past Medical History:  Diagnosis Date   Acute pulmonary embolism ([name]) 11/11/2018   Acute pulmonary embolism with acute cor pulmonale ([name]) 09/16/2017   Acute superficial venous thrombosis of left lower extremity    Aortic insufficiency    Echocardiogram 08/2019: prob bicuspid AoV, mild to mod AI, mild AS (mean 12 mmHg), EF 55-60, no RWMA, Gr 1 DD, GLS -22.2%, normal RVSF, Ao Root 39 mm   Coronary CTA    Coronary CTA 08/2019: Calcium score 0, no evidence of CAD, ascending aorta 4 cm   Diabetes mellitus without complication ([name])    DM (diabetes mellitus) ([name]) 08/18/2011   Impaired fasting glucose    Mood disorder ([name]) 08/18/2011   Obesity, Class III, BMI 40-49.9 (morbid obesity) ([name]) 09/16/2017   Obstructive sleep apnea on CPAP    Osteoarthritis of knee    Personal history of pulmonary embolism    PTSD (post-traumatic stress disorder) 08/18/2011   Pulmonary emboli ([name]) 09/15/2017   Renal disorder    kideny stones   Restless  leg syndrome    Severe recurrent major depression without psychotic features ([name]) 02/17/2018   Sleep apnea 08/18/2011   Thoracic aortic aneurysm [facility])    Thoracic aortic aneurysm without rupture ([name])     Allergies  Allergen Reactions   Sulfa Antibiotics Hives    No current facility-administered medications on file prior to encounter.   Current Outpatient Medications on File Prior to Encounter  Medication Sig Dispense Refill   BREZTRI AEROSPHERE 160-9-4.8 MCG/ACT AERO Inhale 2 puffs into the lungs 2 (two) times daily as needed (wheezing/shortness of breath).     dextromethorphan (DELSYM) 30 MG/5ML liquid Take 15 mg by mouth as needed for cough.     doxepin (SINEQUAN) 10 MG capsule Take 50 mg by mouth at bedtime.     gabapentin (NEURONTIN) 300 MG capsule Take 300 mg by mouth 3 (three) times daily.     guaiFENesin-dextromethorphan (ROBITUSSIN DM) 100-10 MG/5ML syrup Take 5 mLs by mouth every 4 (four) hours as needed for cough.     lamoTRIgine (LAMICTAL) 150 MG tablet Take 150 mg by mouth daily after breakfast.      metFORMIN (GLUCOPHAGE) 500 MG tablet Take 500 mg by mouth 2 (two) times daily with a meal.      oxymetazoline (AFRIN) 0.05 % nasal spray Place 1 spray into both nostrils 2 (two) times daily as needed for congestion.     pseudoephedrine (SUDAFED) 30 MG tablet Take 30 mg by mouth every 4 (four) hours as  needed for congestion.     rOPINIRole (REQUIP) 4 MG tablet Take 8 mg by mouth at bedtime.     TRINTELLIX 20 MG TABS tablet Take 20 mg by mouth daily.     [DISCONTINUED] rivaroxaban (XARELTO) 20 MG TABS tablet Take 20 mg by mouth daily after breakfast.  (Patient not taking: Reported on 11/05/2020)      Physical Exam: Vitals:   11/07/20 1133 11/07/20 1602  BP: 111/65 110/66  Pulse: 62 67  Resp: 18 18  Temp: 98.4 F (36.9 C) 98.8 F (37.1 C)  SpO2: 100% 97%   Body mass index is 48.28 kg/m. Alert and oriented x3. Lungs: Clear to auscultation. Cardiac: Regular rate and  rhythm no rubs gallops murmurs Abdomen: Soft and nontender.  No history of incontinence of bowel or bladder. Full range of motion in the lower extremity at the hip, knee, ankle.  2+ dorsalis pedis/posterior tibialis pulses bilaterally.  Compartments are soft and nontender. Positive numbness and dysesthesias in the left S1 dermatome.  Positive straight leg raise test left lower extremity.  Negative Babinski test, no clonus, symmetrical 1+ deep tendon reflexes at the knee absent at the Achilles.  5/5 motor strength in lower extremity bilaterally.  Image: MR LUMBAR SPINE WO CONTRAST  Result Date: 11/05/2020 CLINICAL DATA:  Low back pain with left leg pain. EXAM: MRI LUMBAR SPINE WITHOUT CONTRAST TECHNIQUE: Multiplanar, multisequence MR imaging of the lumbar spine was performed. No intravenous contrast was administered. COMPARISON:  MRI lumbar spine 09/30/2020 FINDINGS: Segmentation:  Standard Alignment: Slight retrolisthesis L3-4. Slight anterolisthesis L4-5. Vertebrae:  Negative for fracture or mass Conus medullaris and cauda equina: Conus extends to the L1 level. Conus and cauda equina appear normal. Paraspinal and other soft tissues: Negative for paraspinous mass, adenopathy, or fluid collection. Disc levels: L1-2: Normal disc space with moderate facet degeneration. Negative for stenosis L2-3: Mild disc bulging and moderate facet degeneration. Mild spinal stenosis. L3-4: Mild disc bulging and moderate facet degeneration. Mild spinal stenosis and mild subarticular stenosis bilaterally. L4-5: Mild disc degeneration and disc bulging. Severe facet degeneration with bony overgrowth and bilateral facet joint effusions. Moderate to severe spinal stenosis. Moderate subarticular stenosis bilaterally. No interval change L5-S1: Central and left-sided disc protrusion with left S1 nerve root impingement. There is also right subarticular stenosis with less prominent right S1 nerve root impingement. No interval change from  the prior study. IMPRESSION: Mild spinal stenosis L2-3 and L3-4 Moderate to severe spinal stenosis with moderate subarticular stenosis bilaterally L4-5. Severe bilateral facet degeneration. No interval change Central left-sided disc protrusion L5-S1 unchanged. There is bilateral S1 nerve root impingement left greater than right Electronically Signed   By: Marlan Palau M.D.   On: 11/05/2020 16:01    A/P: Erika Woods is a very pleasant 55 year old man who has been under the care of Dr. Yevette Edwards and was noted to have a L5-S1 disc herniation.  She was initially on the schedule to have a surgical discectomy and then unfortunately had COVID.  She has subsequently recovered and has had worsening symptoms as of earlier this week.  Dr. Yevette Edwards was leaving town and asked me to evaluate and take over care.  The patient has an updated MRI which continues to demonstrate the L5-S1 disc herniation causing left S1 radiculopathy.  Her clinical exam is consistent with L5-S1 disc herniation with a positive straight leg raise test as well as numbness and dysesthesias in the S1 dermatome.  This point time I had a long discussion with her about  the treatment options and she is expressed a desire to move forward with a lumbar discectomy.  Risks include: Infection, bleeding, nerve damage, ongoing or worse pain, need for additional surgery including fusion surgery, leak of spinal fluid, recurrent disc herniation,death, stroke, and paralysis.  The patient expressed their understanding of the risks and benefits as well as the surgical plan and willingness to move forward.  We will plan on moving forward with surgery tomorrow afternoon.

## 2020-11-07 NOTE — Progress Notes (Signed)
Patient may eat today, as reflected in her orders. Her anticipated surgery is tomorrow, and an order has been made for her to be NPO after midnight tonight.

## 2020-11-07 NOTE — Progress Notes (Signed)
    Patient appears comfortable She continues to report that her pain is improved compared to her debilitating pain from 2 days prior, and that her p.o. pain medications have been very helpful to her She does however continue to report ongoing constant pain in the left leg   Physical Exam: Vitals:   11/07/20 0759 11/07/20 1133  BP: 122/74 111/65  Pulse: 60 62  Resp: 18 18  Temp: 97.6 F (36.4 C) 98.4 F (36.9 C)  SpO2: 100% 100%   Patient continues to have a positive straight leg raise on the left  Patient continues to have left S1 radiculopathy secondary to a moderate left L5-S1 disc herniation  Patient's current plan is unchanged.  She will continue to be treated with p.o. pain medication, and the plan is for her to proceed with a microdiscectomy procedure tomorrow at the hands of Dr. Shon Baton.  She does understand that she will meet him today, and he will I am sure reiterate additional details of surgery, risks, and the recovery period associated with surgery.  She again did express significant appreciation.

## 2020-11-07 NOTE — Progress Notes (Addendum)
Pt asked to stay NPO just in case surgery is today. No orders at this time.   Pt given education about alternating oral and IV pain meds for better pain management.

## 2020-11-08 ENCOUNTER — Inpatient Hospital Stay (HOSPITAL_COMMUNITY): Payer: Commercial Managed Care - PPO | Admitting: Certified Registered Nurse Anesthetist

## 2020-11-08 ENCOUNTER — Inpatient Hospital Stay (HOSPITAL_COMMUNITY): Payer: Commercial Managed Care - PPO

## 2020-11-08 ENCOUNTER — Encounter (HOSPITAL_COMMUNITY): Payer: Self-pay | Admitting: Orthopedic Surgery

## 2020-11-08 ENCOUNTER — Inpatient Hospital Stay (HOSPITAL_COMMUNITY)
Admission: EM | Disposition: A | Discharge status: Home / Self Care | Payer: Self-pay | Source: Home / Self Care | Attending: Orthopedic Surgery

## 2020-11-08 DIAGNOSIS — M5126 Other intervertebral disc displacement, lumbar region: Secondary | ICD-10-CM | POA: Diagnosis present

## 2020-11-08 HISTORY — PX: LUMBAR LAMINECTOMY/DECOMPRESSION MICRODISCECTOMY: SHX5026

## 2020-11-08 LAB — GLUCOSE, CAPILLARY
Glucose-Capillary: 142 mg/dL — ABNORMAL HIGH (ref 70–99)
Glucose-Capillary: 169 mg/dL — ABNORMAL HIGH (ref 70–99)
Glucose-Capillary: 189 mg/dL — ABNORMAL HIGH (ref 70–99)

## 2020-11-08 SURGERY — LUMBAR LAMINECTOMY/DECOMPRESSION MICRODISCECTOMY 1 LEVEL
Anesthesia: General

## 2020-11-08 MED ORDER — DOCUSATE SODIUM 100 MG PO CAPS
100.0000 mg | ORAL_CAPSULE | Freq: Two times a day (BID) | ORAL | Status: DC
  Administered 2020-11-08 – 2020-11-12 (×8): 100 mg via ORAL
  Filled 2020-11-08 (×8): qty 1

## 2020-11-08 MED ORDER — LACTATED RINGERS IV SOLN: INTRAVENOUS | Status: DC

## 2020-11-08 MED ORDER — ACETAMINOPHEN 10 MG/ML IV SOLN: 1000.0000 mg | Freq: Once | INTRAVENOUS | Status: DC | PRN

## 2020-11-08 MED ORDER — PHENYLEPHRINE 40 MCG/ML (10ML) SYRINGE FOR IV PUSH (FOR BLOOD PRESSURE SUPPORT)
PREFILLED_SYRINGE | INTRAVENOUS | Status: DC | PRN
  Administered 2020-11-08: 120 ug via INTRAVENOUS
  Administered 2020-11-08: 40 ug via INTRAVENOUS
  Administered 2020-11-08: 80 ug via INTRAVENOUS
  Administered 2020-11-08: 160 ug via INTRAVENOUS
  Administered 2020-11-08: 80 ug via INTRAVENOUS

## 2020-11-08 MED ORDER — LACTATED RINGERS IV SOLN: INTRAVENOUS | Status: DC | PRN

## 2020-11-08 MED ORDER — SUGAMMADEX SODIUM 200 MG/2ML IV SOLN
INTRAVENOUS | Status: DC | PRN
  Administered 2020-11-08: 400 mg via INTRAVENOUS

## 2020-11-08 MED ORDER — EPHEDRINE SULFATE-NACL 50-0.9 MG/10ML-% IV SOSY
PREFILLED_SYRINGE | INTRAVENOUS | Status: DC | PRN
  Administered 2020-11-08 (×2): 5 mg via INTRAVENOUS

## 2020-11-08 MED ORDER — METHOCARBAMOL 500 MG PO TABS
500.0000 mg | ORAL_TABLET | Freq: Four times a day (QID) | ORAL | Status: DC | PRN
  Administered 2020-11-09 – 2020-11-12 (×6): 500 mg via ORAL
  Filled 2020-11-08 (×6): qty 1

## 2020-11-08 MED ORDER — OXYCODONE HCL 5 MG PO TABS
10.0000 mg | ORAL_TABLET | ORAL | Status: DC | PRN
  Administered 2020-11-08 – 2020-11-12 (×18): 10 mg via ORAL
  Filled 2020-11-08 (×18): qty 2

## 2020-11-08 MED ORDER — PHENYLEPHRINE HCL-NACL 10-0.9 MG/250ML-% IV SOLN
INTRAVENOUS | Status: DC | PRN
  Administered 2020-11-08: 25 ug/min via INTRAVENOUS

## 2020-11-08 MED ORDER — ACETAMINOPHEN 325 MG PO TABS
650.0000 mg | ORAL_TABLET | ORAL | Status: DC | PRN
  Administered 2020-11-09 – 2020-11-11 (×5): 650 mg via ORAL
  Filled 2020-11-08 (×5): qty 2

## 2020-11-08 MED ORDER — PROPOFOL 10 MG/ML IV BOLUS
INTRAVENOUS | Status: DC | PRN
  Administered 2020-11-08: 150 mg via INTRAVENOUS

## 2020-11-08 MED ORDER — POLYETHYLENE GLYCOL 3350 17 G PO PACK
17.0000 g | PACK | Freq: Every day | ORAL | Status: DC | PRN
  Administered 2020-11-09 – 2020-11-10 (×2): 17 g via ORAL
  Filled 2020-11-08 (×2): qty 1

## 2020-11-08 MED ORDER — THROMBIN 20000 UNITS EX SOLR: CUTANEOUS | Status: DC | PRN

## 2020-11-08 MED ORDER — INSULIN ASPART 100 UNIT/ML IJ SOLN
0.0000 [IU] | Freq: Three times a day (TID) | INTRAMUSCULAR | Status: DC
  Administered 2020-11-09: 3 [IU] via SUBCUTANEOUS
  Administered 2020-11-09 – 2020-11-10 (×2): 2 [IU] via SUBCUTANEOUS

## 2020-11-08 MED ORDER — METHOCARBAMOL 1000 MG/10ML IJ SOLN
500.0000 mg | Freq: Four times a day (QID) | INTRAMUSCULAR | Status: DC | PRN
  Filled 2020-11-08: qty 5

## 2020-11-08 MED ORDER — ONDANSETRON HCL 4 MG/2ML IJ SOLN: 4.0000 mg | Freq: Four times a day (QID) | INTRAMUSCULAR | Status: DC | PRN

## 2020-11-08 MED ORDER — PHENOL 1.4 % MT LIQD: 1.0000 | OROMUCOSAL | Status: DC | PRN

## 2020-11-08 MED ORDER — ROCURONIUM BROMIDE 10 MG/ML (PF) SYRINGE
PREFILLED_SYRINGE | INTRAVENOUS | Status: DC | PRN
  Administered 2020-11-08: 30 mg via INTRAVENOUS
  Administered 2020-11-08: 50 mg via INTRAVENOUS

## 2020-11-08 MED ORDER — BUPIVACAINE-EPINEPHRINE 0.25% -1:200000 IJ SOLN
INTRAMUSCULAR | Status: DC | PRN
  Administered 2020-11-08: 30 mL

## 2020-11-08 MED ORDER — ACETAMINOPHEN 160 MG/5ML PO SOLN: 325.0000 mg | ORAL | Status: DC | PRN

## 2020-11-08 MED ORDER — ACETAMINOPHEN 10 MG/ML IV SOLN
INTRAVENOUS | Status: DC | PRN
  Administered 2020-11-08: 1000 mg via INTRAVENOUS

## 2020-11-08 MED ORDER — ACETAMINOPHEN 325 MG PO TABS: 325.0000 mg | ORAL_TABLET | ORAL | Status: DC | PRN

## 2020-11-08 MED ORDER — BUPIVACAINE-EPINEPHRINE (PF) 0.25% -1:200000 IJ SOLN
INTRAMUSCULAR | Status: AC
  Filled 2020-11-08: qty 30

## 2020-11-08 MED ORDER — ALBUTEROL SULFATE HFA 108 (90 BASE) MCG/ACT IN AERS
INHALATION_SPRAY | RESPIRATORY_TRACT | Status: DC | PRN
  Administered 2020-11-08 (×2): 4 via RESPIRATORY_TRACT

## 2020-11-08 MED ORDER — LIDOCAINE 2% (20 MG/ML) 5 ML SYRINGE
INTRAMUSCULAR | Status: DC | PRN
  Administered 2020-11-08: 100 mg via INTRAVENOUS

## 2020-11-08 MED ORDER — ACETAMINOPHEN 650 MG RE SUPP: 650.0000 mg | RECTAL | Status: DC | PRN

## 2020-11-08 MED ORDER — ONDANSETRON HCL 4 MG PO TABS: 4.0000 mg | ORAL_TABLET | Freq: Three times a day (TID) | ORAL | 0 refills | Status: DC | PRN

## 2020-11-08 MED ORDER — SUCCINYLCHOLINE CHLORIDE 200 MG/10ML IV SOSY
PREFILLED_SYRINGE | INTRAVENOUS | Status: DC | PRN
  Administered 2020-11-08: 120 mg via INTRAVENOUS

## 2020-11-08 MED ORDER — METHYLPREDNISOLONE ACETATE 40 MG/ML IJ SUSP
INTRAMUSCULAR | Status: AC
  Filled 2020-11-08: qty 1

## 2020-11-08 MED ORDER — MIDAZOLAM HCL 2 MG/2ML IJ SOLN
INTRAMUSCULAR | Status: AC
  Filled 2020-11-08: qty 2

## 2020-11-08 MED ORDER — 0.9 % SODIUM CHLORIDE (POUR BTL) OPTIME
TOPICAL | Status: DC | PRN
  Administered 2020-11-08: 1000 mL

## 2020-11-08 MED ORDER — FLEET ENEMA 7-19 GM/118ML RE ENEM
1.0000 | ENEMA | Freq: Once | RECTAL | Status: AC | PRN
  Administered 2020-11-10: 1 via RECTAL
  Filled 2020-11-08: qty 1

## 2020-11-08 MED ORDER — ALBUTEROL SULFATE HFA 108 (90 BASE) MCG/ACT IN AERS
INHALATION_SPRAY | RESPIRATORY_TRACT | Status: AC
  Filled 2020-11-08: qty 6.7

## 2020-11-08 MED ORDER — BUPIVACAINE LIPOSOME 1.3 % IJ SUSP
INTRAMUSCULAR | Status: DC | PRN
  Administered 2020-11-08: 20 mL

## 2020-11-08 MED ORDER — MIDAZOLAM HCL 5 MG/5ML IJ SOLN
INTRAMUSCULAR | Status: DC | PRN
  Administered 2020-11-08 (×2): 1 mg via INTRAVENOUS

## 2020-11-08 MED ORDER — ONDANSETRON HCL 4 MG PO TABS: 4.0000 mg | ORAL_TABLET | Freq: Four times a day (QID) | ORAL | Status: DC | PRN

## 2020-11-08 MED ORDER — TRANEXAMIC ACID-NACL 1000-0.7 MG/100ML-% IV SOLN
INTRAVENOUS | Status: AC
  Filled 2020-11-08: qty 100

## 2020-11-08 MED ORDER — PROMETHAZINE HCL 25 MG/ML IJ SOLN: 6.2500 mg | INTRAMUSCULAR | Status: DC | PRN

## 2020-11-08 MED ORDER — FENTANYL CITRATE (PF) 250 MCG/5ML IJ SOLN
INTRAMUSCULAR | Status: AC
  Filled 2020-11-08: qty 5

## 2020-11-08 MED ORDER — PROPOFOL 10 MG/ML IV BOLUS
INTRAVENOUS | Status: AC
  Filled 2020-11-08: qty 20

## 2020-11-08 MED ORDER — BUPIVACAINE LIPOSOME 1.3 % IJ SUSP
INTRAMUSCULAR | Status: AC
  Filled 2020-11-08: qty 20

## 2020-11-08 MED ORDER — MENTHOL 3 MG MT LOZG: 1.0000 | LOZENGE | OROMUCOSAL | Status: DC | PRN

## 2020-11-08 MED ORDER — FENTANYL CITRATE (PF) 250 MCG/5ML IJ SOLN
INTRAMUSCULAR | Status: DC | PRN
  Administered 2020-11-08: 25 ug via INTRAVENOUS
  Administered 2020-11-08: 50 ug via INTRAVENOUS
  Administered 2020-11-08: 100 ug via INTRAVENOUS

## 2020-11-08 MED ORDER — FENTANYL CITRATE (PF) 100 MCG/2ML IJ SOLN
INTRAMUSCULAR | Status: AC
  Filled 2020-11-08: qty 2

## 2020-11-08 MED ORDER — CEFAZOLIN SODIUM-DEXTROSE 2-4 GM/100ML-% IV SOLN
2.0000 g | Freq: Three times a day (TID) | INTRAVENOUS | Status: AC
  Administered 2020-11-08 – 2020-11-09 (×2): 2 g via INTRAVENOUS
  Filled 2020-11-08 (×2): qty 100

## 2020-11-08 MED ORDER — OXYCODONE-ACETAMINOPHEN 10-325 MG PO TABS: 1.0000 | ORAL_TABLET | Freq: Four times a day (QID) | ORAL | 0 refills | Status: DC | PRN

## 2020-11-08 MED ORDER — GABAPENTIN 300 MG PO CAPS
300.0000 mg | ORAL_CAPSULE | Freq: Three times a day (TID) | ORAL | Status: DC
  Administered 2020-11-08 – 2020-11-12 (×12): 300 mg via ORAL
  Filled 2020-11-08 (×12): qty 1

## 2020-11-08 MED ORDER — ACETAMINOPHEN 10 MG/ML IV SOLN
INTRAVENOUS | Status: AC
  Filled 2020-11-08: qty 100

## 2020-11-08 MED ORDER — SODIUM CHLORIDE 0.9% FLUSH: 3.0000 mL | INTRAVENOUS | Status: DC | PRN

## 2020-11-08 MED ORDER — OXYCODONE HCL 5 MG/5ML PO SOLN: 5.0000 mg | Freq: Once | ORAL | Status: DC | PRN

## 2020-11-08 MED ORDER — CEFAZOLIN IN SODIUM CHLORIDE 3-0.9 GM/100ML-% IV SOLN
3.0000 g | Freq: Once | INTRAVENOUS | Status: AC
  Administered 2020-11-08: 3 g via INTRAVENOUS
  Filled 2020-11-08 (×2): qty 100

## 2020-11-08 MED ORDER — SODIUM CHLORIDE 0.9 % IV SOLN: 250.0000 mL | INTRAVENOUS | Status: DC

## 2020-11-08 MED ORDER — THROMBIN (RECOMBINANT) 20000 UNITS EX SOLR
CUTANEOUS | Status: AC
  Filled 2020-11-08: qty 20000

## 2020-11-08 MED ORDER — MORPHINE SULFATE (PF) 2 MG/ML IV SOLN
2.0000 mg | INTRAVENOUS | Status: DC | PRN
  Administered 2020-11-08: 2 mg via INTRAVENOUS
  Filled 2020-11-08: qty 1

## 2020-11-08 MED ORDER — FENTANYL CITRATE (PF) 100 MCG/2ML IJ SOLN
25.0000 ug | INTRAMUSCULAR | Status: DC | PRN
  Administered 2020-11-08: 25 ug via INTRAVENOUS

## 2020-11-08 MED ORDER — SODIUM CHLORIDE 0.9% FLUSH
3.0000 mL | Freq: Two times a day (BID) | INTRAVENOUS | Status: DC
  Administered 2020-11-09 – 2020-11-11 (×4): 3 mL via INTRAVENOUS

## 2020-11-08 MED ORDER — ONDANSETRON HCL 4 MG/2ML IJ SOLN
INTRAMUSCULAR | Status: DC | PRN
  Administered 2020-11-08: 4 mg via INTRAVENOUS

## 2020-11-08 MED ORDER — ALBUMIN HUMAN 5 % IV SOLN: INTRAVENOUS | Status: DC | PRN

## 2020-11-08 MED ORDER — OXYCODONE HCL 5 MG PO TABS
5.0000 mg | ORAL_TABLET | ORAL | Status: DC | PRN
  Administered 2020-11-11 – 2020-11-12 (×3): 5 mg via ORAL
  Filled 2020-11-08 (×3): qty 1

## 2020-11-08 MED ORDER — DEXAMETHASONE SODIUM PHOSPHATE 10 MG/ML IJ SOLN
INTRAMUSCULAR | Status: DC | PRN
  Administered 2020-11-08: 10 mg via INTRAVENOUS

## 2020-11-08 MED ORDER — OXYCODONE HCL 5 MG PO TABS: 5.0000 mg | ORAL_TABLET | Freq: Once | ORAL | Status: DC | PRN

## 2020-11-08 MED ORDER — INSULIN ASPART 100 UNIT/ML IJ SOLN: 0.0000 [IU] | Freq: Every day | INTRAMUSCULAR | Status: DC

## 2020-11-08 MED ORDER — AMISULPRIDE (ANTIEMETIC) 5 MG/2ML IV SOLN: 10.0000 mg | Freq: Once | INTRAVENOUS | Status: DC | PRN

## 2020-11-08 MED ORDER — METHOCARBAMOL 500 MG PO TABS: 500.0000 mg | ORAL_TABLET | Freq: Three times a day (TID) | ORAL | 0 refills | Status: DC | PRN

## 2020-11-08 SURGICAL SUPPLY — 57 items
AGENT HMST KT MTR STRL THRMB (HEMOSTASIS)
BAG COUNTER SPONGE SURGICOUNT (BAG) ×2 IMPLANT
BAG SPNG CNTER NS LX DISP (BAG) ×1
BNDG GAUZE ELAST 4 BULKY (GAUZE/BANDAGES/DRESSINGS) ×2 IMPLANT
BUR EGG ELITE 4.0 (BURR) IMPLANT
BUR MATCHSTICK NEURO 3.0 LAGG (BURR) IMPLANT
CANISTER SUCT 3000ML PPV (MISCELLANEOUS) ×2 IMPLANT
CLSR STERI-STRIP ANTIMIC 1/2X4 (GAUZE/BANDAGES/DRESSINGS) ×2 IMPLANT
CORD BIPOLAR FORCEPS 12FT (ELECTRODE) ×2 IMPLANT
COVER SURGICAL LIGHT HANDLE (MISCELLANEOUS) ×2 IMPLANT
DRAIN CHANNEL 15F RND FF W/TCR (WOUND CARE) IMPLANT
DRAPE POUCH INSTRU U-SHP 10X18 (DRAPES) ×2 IMPLANT
DRAPE SURG 17X23 STRL (DRAPES) ×2 IMPLANT
DRAPE U-SHAPE 47X51 STRL (DRAPES) ×2 IMPLANT
DRSG OPSITE POSTOP 3X4 (GAUZE/BANDAGES/DRESSINGS) ×2 IMPLANT
DRSG OPSITE POSTOP 4X6 (GAUZE/BANDAGES/DRESSINGS) ×1 IMPLANT
DURAPREP 26ML APPLICATOR (WOUND CARE) ×2 IMPLANT
ELECT BLADE 4.0 EZ CLEAN MEGAD (MISCELLANEOUS)
ELECT CAUTERY BLADE 6.4 (BLADE) ×2 IMPLANT
ELECT PENCIL ROCKER SW 15FT (MISCELLANEOUS) ×2 IMPLANT
ELECT REM PT RETURN 9FT ADLT (ELECTROSURGICAL) ×2
ELECTRODE BLDE 4.0 EZ CLN MEGD (MISCELLANEOUS) IMPLANT
ELECTRODE REM PT RTRN 9FT ADLT (ELECTROSURGICAL) ×1 IMPLANT
EVACUATOR SILICONE 100CC (DRAIN) IMPLANT
GLOVE SURG ENC MOIS LTX SZ6.5 (GLOVE) ×2 IMPLANT
GLOVE SURG MICRO LTX SZ8.5 (GLOVE) ×2 IMPLANT
GLOVE SURG UNDER POLY LF SZ6.5 (GLOVE) ×2 IMPLANT
GLOVE SURG UNDER POLY LF SZ8.5 (GLOVE) ×2 IMPLANT
GOWN STRL REUS W/ TWL LRG LVL3 (GOWN DISPOSABLE) ×2 IMPLANT
GOWN STRL REUS W/TWL 2XL LVL3 (GOWN DISPOSABLE) ×2 IMPLANT
GOWN STRL REUS W/TWL LRG LVL3 (GOWN DISPOSABLE) ×4
KIT BASIN OR (CUSTOM PROCEDURE TRAY) ×2 IMPLANT
KIT TURNOVER KIT B (KITS) ×2 IMPLANT
NDL SPNL 18GX3.5 QUINCKE PK (NEEDLE) ×2 IMPLANT
NEEDLE 22X1 1/2 (OR ONLY) (NEEDLE) ×2 IMPLANT
NEEDLE SPNL 18GX3.5 QUINCKE PK (NEEDLE) ×4 IMPLANT
NS IRRIG 1000ML POUR BTL (IV SOLUTION) ×2 IMPLANT
PACK LAMINECTOMY ORTHO (CUSTOM PROCEDURE TRAY) ×2 IMPLANT
PACK UNIVERSAL I (CUSTOM PROCEDURE TRAY) ×2 IMPLANT
PAD ARMBOARD 7.5X6 YLW CONV (MISCELLANEOUS) ×4 IMPLANT
PATTIES SURGICAL .5 X.5 (GAUZE/BANDAGES/DRESSINGS) ×2 IMPLANT
PATTIES SURGICAL .5 X1 (DISPOSABLE) ×1 IMPLANT
SPONGE SURGIFOAM ABS GEL 100 (HEMOSTASIS) ×2 IMPLANT
SURGIFLO W/THROMBIN 8M KIT (HEMOSTASIS) IMPLANT
SUT BONE WAX W31G (SUTURE) ×2 IMPLANT
SUT MNCRL AB 3-0 PS2 27 (SUTURE) ×2 IMPLANT
SUT VIC AB 0 CT1 27 (SUTURE)
SUT VIC AB 0 CT1 27XBRD ANBCTR (SUTURE) IMPLANT
SUT VIC AB 1 CT1 18XCR BRD 8 (SUTURE) ×2 IMPLANT
SUT VIC AB 1 CT1 8-18 (SUTURE) ×4
SUT VIC AB 2-0 CT1 18 (SUTURE) ×4 IMPLANT
SYR BULB IRRIG 60ML STRL (SYRINGE) ×2 IMPLANT
SYR CONTROL 10ML LL (SYRINGE) ×2 IMPLANT
TOWEL GREEN STERILE (TOWEL DISPOSABLE) ×2 IMPLANT
TOWEL GREEN STERILE FF (TOWEL DISPOSABLE) ×2 IMPLANT
WATER STERILE IRR 1000ML POUR (IV SOLUTION) ×2 IMPLANT
YANKAUER SUCT BULB TIP NO VENT (SUCTIONS) ×2 IMPLANT

## 2020-11-08 NOTE — Anesthesia Postprocedure Evaluation (Signed)
Anesthesia Post Note  Patient: Erika Woods  Procedure(s) Performed: LUMBAR FIVE AND SACRAL ONE LAMINECTOMY/DECOMPRESSION MICRODISCECTOMY 1 LEVEL     Patient location during evaluation: Other (OR) Anesthesia Type: General Level of consciousness: awake Pain management: pain level controlled Vital Signs Assessment: post-procedure vital signs reviewed and stable Respiratory status: spontaneous breathing, nonlabored ventilation, respiratory function stable and patient connected to nasal cannula oxygen Cardiovascular status: blood pressure returned to baseline and stable Postop Assessment: no apparent nausea or vomiting Anesthetic complications: no   No notable events documented.  Last Vitals:  Vitals:   11/08/20 1935 11/08/20 2002  BP: 129/90 129/88  Pulse: 85 85  Resp: 13 19  Temp: (!) 36.3 C 36.4 C  SpO2: 94% 95%    Last Pain:  Vitals:   11/08/20 2002  TempSrc: Oral  PainSc:                  Catheryn Bacon Daveda Larock

## 2020-11-08 NOTE — Discharge Instructions (Addendum)
Laminectomy, Care After This sheet gives you information about how to care for yourself after your procedure. Your health care provider may also give you more specific instructions. If you have problems or questions, contact your health care provider. What can I expect after the procedure? After the procedure, it is common to have: Some pain around your incision area. Muscle tightening (spasms) across the back.   Follow these instructions at home: Incision care Follow instructions from your health care provider about how to take care of your incision area. Make sure you: Wash your hands with soap and water before and after you apply medicine to the area or change your bandage (dressing). If soap and water are not available, use hand sanitizer. Change your dressing as told by your health care provider. Leave stitches (sutures), skin glue, or adhesive strips in place. These skin closures may need to stay in place for 2 weeks or longer. If adhesive strip edges start to loosen and curl up, you may trim the loose edges. Do not remove adhesive strips completely unless your health care provider tells you to do that.  Check your incision area every day for signs of infection. Check for: More redness, swelling, or pain. More fluid or blood. Warmth. Pus or a bad smell. Medicines Take over-the-counter and prescription medicines only as told by your health care provider. If you were prescribed an antibiotic medicine, use it as told by your health care provider. Do not stop using the antibiotic even if you start to feel better. If needed, call office in 3 days to request refill of pain medications. Bathing Do not take baths, swim, or use a hot tub for 6 weeks, or until your incision has healed completely. If your health care provider approves, you may take showers after your dressing has been removed. Ok to shower in 5 days Activity Return to your normal activities as told by your health care  provider. Ask your health care provider what activities are safe for you. Avoid bending or twisting at your waist. Always bend at your knees. Do not sit for more than 20-30 minutes at a time. Lie down or walk between periods of sitting. Do not lift anything that is heavier than 10 lb (4.5 kg) or the limit that your health care provider tells you, until he or she says that it is safe. Do not drive for 2 weeks after your procedure or for as long as your health care provider tells you.  Do not drive or use heavy machinery while taking prescription pain medicine. General instructions To prevent or treat constipation while you are taking prescription pain medicine, your health care provider may recommend that you: Drink enough fluid to keep your urine clear or pale yellow. Take over-the-counter or prescription medicines. Eat foods that are high in fiber, such as fresh fruits and vegetables, whole grains, and beans. Limit foods that are high in fat and processed sugars, such as fried and sweet foods. Do breathing exercises as told. Keep all follow-up visits as told by your health care provider. This is important. Contact a health care provider if: You have more redness, swelling, or pain around your incision area. Your incision feels warm to the touch. You are not able to return to activities or do exercises as told by your health care provider. Get help right away if: You have: More fluid or blood coming from your incision area. Pus or a bad smell coming from your incision area. Chills or a fever.   Episodes of dizziness or fainting while standing. You develop a rash. You develop shortness of breath or you have difficulty breathing. You cannot control when you urinate or have a bowel movement. You become weak. You are not able to use your legs. Summary After the procedure, it is common to have some pain around your incision area. You may also have muscle tightening (spasms) across the  back. Follow instructions from your health care provider about how to care for your incision. Do not lift anything that is heavier than 10 lb (4.5 kg) or the limit that your health care provider tells you, until he or she says that it is safe. Contact your health care provider if you have more redness, swelling, or pain around your incision area or if your incision feels warm to the touch. These can be signs of infection. This information is not intended to replace advice given to you by your health care provider. Make sure you discuss any questions you have with your health care provider. Refer to this sheet in the next few weeks. These instructions provide you with information about caring for yourself after your procedure. Your health care provider may also give you more specific instructions. Your treatment has been planned according to current medical practices, but problems sometimes occur. Call your health care provider if you have any problems or questions after your procedure. What can I expect after the procedure? It is common to have pain for the first few days after the procedure. Some people continue to have mild pain even after making a full recovery. Follow these instructions at home: Medicine Take medicines only as directed by your health care provider. Avoid taking over-the-counter pain medicines unless your health care provider tells you otherwise. These medicines interfere with the development and growth of new bone cells. If you were prescribed a narcotic pain medicine, take it exactly as told by your health care provider. Do not drink alcohol while on the medicine. Do not drive while on the medicine. Injury care Care for your back brace as told by your health care provider. If directed, apply ice to the injured area: Put ice in a plastic bag. Place a towel between your skin and the bag. Leave the ice on for 20 minutes, 2-3 times a day. Activity Perform physical therapy  exercises as told by your health care provider. Exercise regularly. Start by taking short walks. Slowly increase your activity level over time. Gentle exercise helps to ease pain. Sit, stand, walk, turn in bed, and reposition yourself as told by your health care provider. This will help to keep your spine in proper alignment. Avoid bending and twisting your body. Avoid doing strenuous household chores, such as vacuuming. Do not lift anything that is heavier than 10 lb (4.5 kg). Other Instructions Keep all follow-up visits as directed by your health care provider. This is important. Do not use any tobacco products, including cigarettes, chewing tobacco, or electronic cigarettes. If you need help quitting, ask your health care provider. Nicotine affects the way bones heal. Contact a health care provider if: Your pain gets worse. You have a fever. You have redness, swelling, or pain at the site of your incision. You have fluid, blood, or pus coming from your incision. You have numbness, tingling, or weakness in any part of your body. Get help right away if: Your incision feels swollen and tender, and the surrounding area looks like a lump. The lump may be red or bluish in color. You   cannot move any part of your body (paralysis). You cannot control your bladder or bowels.   Information on my medicine - XARELTO (rivaroxaban)  This medication education was reviewed with me or my healthcare representative as part of my discharge preparation.  The pharmacist that spoke with me during my hospital stay was:    WHY WAS XARELTO PRESCRIBED FOR YOU? Xarelto was prescribed to treat blood clots that may have been found in the veins of your legs (deep vein thrombosis) or in your lungs (pulmonary embolism) and to reduce the risk of them occurring again.  What do you need to know about Xarelto? Continue Xarelto 20mg  ONCE A DAY with your evening meal.  DO NOT stop taking Xarelto without talking to the  health care provider who prescribed the medication.  Refill your prescription for 20 mg tablets before you run out.  After discharge, you should have regular check-up appointments with your healthcare provider that is prescribing your Xarelto.  In the future your dose may need to be changed if your kidney function changes by a significant amount.  What do you do if you miss a dose? If you are taking Xarelto TWICE DAILY and you miss a dose, take it as soon as you remember. You may take two 15 mg tablets (total 30 mg) at the same time then resume your regularly scheduled 15 mg twice daily the next day.  If you are taking Xarelto ONCE DAILY and you miss a dose, take it as soon as you remember on the same day then continue your regularly scheduled once daily regimen the next day. Do not take two doses of Xarelto at the same time.   Important Safety Information Xarelto is a blood thinner medicine that can cause bleeding. You should call your healthcare provider right away if you experience any of the following: Bleeding from an injury or your nose that does not stop. Unusual colored urine (red or dark brown) or unusual colored stools (red or black). Unusual bruising for unknown reasons. A serious fall or if you hit your head (even if there is no bleeding).  Some medicines may interact with Xarelto and might increase your risk of bleeding while on Xarelto. To help avoid this, consult your healthcare provider or pharmacist prior to using any new prescription or non-prescription medications, including herbals, vitamins, non-steroidal anti-inflammatory drugs (NSAIDs) and supplements.  This website has more information on Xarelto: .

## 2020-11-08 NOTE — OR Nursing (Signed)
Radiologist called and confirmed that the probe was located at L5-S1 facet.  Dr. Shon Baton notified.

## 2020-11-08 NOTE — Progress Notes (Signed)
    Subjective: Procedure(s) (LRB): LUMBAR FIVE AND SACRAL ONE LAMINECTOMY/DECOMPRESSION MICRODISCECTOMY 1 LEVEL (N/A) Day of Surgery  Patient reports pain as 7 on 0-10 scale.  Reports unchanged left leg pain Minimal  back pain   Positive void Negative bowel movement Negative flatus Negative chest pain or shortness of breath  Objective: Vital signs in last 24 hours: Temp:  [97.7 F (36.5 C)-98.8 F (37.1 C)] 98.4 F (36.9 C) (06/30 1219) Pulse Rate:  [67-88] 73 (06/30 1219) Resp:  [18-20] 18 (06/30 1219) BP: (109-131)/(66-77) 128/71 (06/30 1219) SpO2:  [91 %-100 %] 94 % (06/30 1219)  Intake/Output from previous day: 06/29 0701 - 06/30 0700 In: 1080 [P.O.:1080] Out: -   Labs: No results for input(s): WBC, RBC, HCT, PLT in the last 72 hours. No results for input(s): NA, K, CL, CO2, BUN, CREATININE, GLUCOSE, CALCIUM in the last 72 hours. No results for input(s): LABPT, INR in the last 72 hours.  Physical Exam: ABD soft Intact pulses distally Compartment soft No SOB/CP Lungs CTA Positive st leg raise test left LE Body mass index is 48.28 kg/m.   Assessment/Plan: No change in clinical exam since last evaluation yesterday Patient had a positive home COVID test on 10/26/20.   Has remained asymptomatic and presents today for surgical management of lumbar HNP. I was asked to assume care by Dr Yevette Edwards since he would be unable to perform her surgery and she has significant worsening symptoms.  Her pain increased to the point that she was admitted for pain control on 11/05/20 Consent obtained - and risks reviewed  Venita Lick, MD [facility] [phone]

## 2020-11-08 NOTE — Brief Op Note (Signed)
11/05/2020 - 11/08/2020  5:39 PM  PATIENT:  [name]  55 y.o. female  PRE-OPERATIVE DIAGNOSIS:  Lumbar five -sacral one compression  POST-OPERATIVE DIAGNOSIS:  Lumbar five -sacral one compression  PROCEDURE:  Procedure(s): LUMBAR FIVE AND SACRAL ONE LAMINECTOMY/DECOMPRESSION MICRODISCECTOMY 1 LEVEL (N/A)  SURGEON:  Surgeon(s) and Role:    Venita Lick, MD - Primary  PHYSICIAN ASSISTANT:   ASSISTANTS: Voncille Lo, PA  ANESTHESIA:   general  EBL:  50 mL   BLOOD ADMINISTERED:none  DRAINS: none   LOCAL MEDICATIONS USED:  MARCAINE    and OTHER Exparel  SPECIMEN:  No Specimen  DISPOSITION OF SPECIMEN:  N/A  COUNTS:  YES  TOURNIQUET:  * No tourniquets in log *  DICTATION: .Dragon Dictation  PLAN OF CARE: Admit to inpatient   PATIENT DISPOSITION: Extubated and recovered in the surgical room because of prior exposure to COVID.

## 2020-11-08 NOTE — Op Note (Signed)
OPERATIVE REPORT  DATE OF SURGERY: 11/08/2020  PATIENT NAME:  Erika Woods MRN: [id number] DOB: January 31, 1966  PCP: Dois Davenport, MD  PRE-OPERATIVE DIAGNOSIS: Left L5-S1 disc herniation with radiculopathy  POST-OPERATIVE DIAGNOSIS: Same  PROCEDURE:   Left L5-S1 discectomy CPT code: 21224  SURGEON:  Venita Lick, MD  PHYSICIAN ASSISTANT: Voncille Lo, PA  ANESTHESIA:   General  EBL: 50 ml   BRIEF HISTORY: Erika Woods is a 55 y.o. female who has been under the care of Dr. Yevette Edwards and presented to his office earlier this week with severe worsening back buttock and radicular left leg pain.  Because of scheduling issues he contacted me and asked to move forward with definitive surgical management for this patient.  He did admit the patient on Monday and she has been in the hospital complaining primarily of left lower extremity pain.  Repeat MRI demonstrated the L5-S1 disc herniation posterior lateral to the left causing S1 nerve compression.  As result of the ongoing radiculopathy we elected to move forward with surgery.  All appropriate risks, benefits, alternatives to surgery were discussed with the patient and consent was obtained.  PROCEDURE DETAILS: Patient was brought into the operating room. After successful induction of general anesthesia and endotracheal intubation a Time Out was done. This confirmed all pertinent important data.  Patient was turned prone onto the Wilson frame and all bony prominences were well-padded.  The back was then prepped and draped in a standard fashion.  2 needles were then placed into the back and an x-ray was taken for localization of the incision site.  The incisions site was marked and then infiltrated with quarter percent Marcaine with epinephrine.  Incision was made and sharp dissection was carried out down to the deep fascia.  Once I had exposed the deep fascia I injected the paraspinal muscles with Marcaine with Exparel for postoperative  analgesia.  I then incised the deep fascia and stripped the paraspinal muscles to expose the L5 and S1 spinous process and the L5 lamina as well as the L5-S1 facet complex.  A Penfield 4 was placed into the L5 lamina and an x-ray was taken confirming I was at the appropriate level.  Large Taylor retractor was placed on the lateral aspect of the facet for visualization and I performed a laminotomy with a 3 mm Kerrison rongeur.  I gently dissected through the ligamentum flavum with my Penfield 4 until I created the plane between the ligamentum flavum and the thecal sac.  I then resected this with a 3 mm Kerrison rongeur.  The S1 nerve root came into visualization and was dorsally displaced and obvious compression.  I gently worked my way lateral performing a medial facetectomy with a 2 mm Kerrison rongeur in order to of identify the posterior aspect of the disc space.  Once I was able to see this identified the disc herniation.  I gently began mobilizing the S1 nerve root and swept it medially until I could clearly visualize the disc fragment.  An annulotomy was performed and then I removed 2 fragments of disc material from the posterior lateral gutter.  At this point the S1 nerve root was now more mobile and no longer dorsally displaced.  Because of the severity of her pain and the compression of the nerve root a medial foraminotomy was performed with a 2 and 3 mm Kerrison rongeur to further decompress the S1 nerve root.  I then swept circumferentially underneath the equal sac and nerve root.  There were large osteophytes which I resected with the Epstein curette  Epidural veins were coagulated with bipolar electrocautery for hemostasis.  At this point I was able to apply [facility] elevator superiorly inferiorly and along the annulus medially confirming adequate decompression of the thecal sac and neural elements.  My nerve hook was able to pass along the undersurface of the S1 nerve root all the way out into the  foramen.  The S1 nerve root itself was also freely mobile and no longer compressed.  The wound was copiously irrigated with normal saline and Floseal was placed for hemostasis.  I irrigated the wound once again and did a final check ensuring there was no neurocompression.  Thrombin-soaked Gelfoam patty was placed over the laminotomy defect and the retractors were removed.  The wound was then closed in a layered fashion with an interrupted #1 Vicryl suture, a running 0 Vicryl stitch, 2-0 Vicryl sutures interrupted and a 3-0 Monocryl for the skin.  Steri-Strips and dry dressings were applied.  The patient was ultimately extubated and recovered in the OR room because of her exposure to COVID.   Venita Lick, MD 11/08/2020 5:32 PM

## 2020-11-08 NOTE — Transfer of Care (Signed)
Immediate Anesthesia Transfer of Care Note  Patient: Erika Woods  Procedure(s) Performed: LUMBAR FIVE AND SACRAL ONE LAMINECTOMY/DECOMPRESSION MICRODISCECTOMY 1 LEVEL  Patient Location: PACU  Anesthesia Type:General  Level of Consciousness: awake, alert  and oriented  Airway & Oxygen Therapy: Patient Spontanous Breathing and Patient connected to face mask oxygen  Post-op Assessment: Report given to RN and Post -op Vital signs reviewed and stable  Post vital signs: Reviewed and stable  Last Vitals:  Vitals Value Taken Time  BP 123/85 11/08/20 1834  Temp    Pulse 89 11/08/20 1836  Resp 17 11/08/20 1836  SpO2 96 % 11/08/20 1836  Vitals shown include unvalidated device data.  Last Pain:  Vitals:   11/08/20 1219  TempSrc: Oral  PainSc:       Patients Stated Pain Goal: 0 (11/08/20 0327)  Complications: No notable events documented.

## 2020-11-08 NOTE — Progress Notes (Signed)
Patient said she was positive for covid on 6/15. And she has not been on precautions since her first day here on 6/27. She has not had a fever or oxygen needed. She will be getting surgery today.

## 2020-11-08 NOTE — Progress Notes (Signed)
Orthopedic Tech Progress Note Patient Details:  Erika Woods 24-Mar-1966 [id number]  Ortho Devices Type of Ortho Device: Lumbar corsett Ortho Device/Splint Location: BACK Ortho Device/Splint Interventions: Ordered   Post Interventions Patient Tolerated: Other (comment) Instructions Provided: Other (comment)  Michelle Piper 11/08/2020, 11:53 PM

## 2020-11-08 NOTE — Anesthesia Procedure Notes (Signed)
Procedure Name: Intubation Date/Time: 11/08/2020 3:23 PM Performed by: Audie Pinto, CRNA Pre-anesthesia Checklist: Patient identified, Emergency Drugs available, Suction available and Patient being monitored Patient Re-evaluated:Patient Re-evaluated prior to induction Oxygen Delivery Method: Circle system utilized Preoxygenation: Pre-oxygenation with 100% oxygen Induction Type: IV induction and Rapid sequence Laryngoscope Size: Glidescope and 3 Grade View: Grade I Tube type: Oral Tube size: 7.0 mm Number of attempts: 1 Airway Equipment and Method: Stylet and Oral airway Placement Confirmation: ETT inserted through vocal cords under direct vision, positive ETCO2 and breath sounds checked- equal and bilateral Secured at: 20 cm Tube secured with: Tape Dental Injury: Teeth and Oropharynx as per pre-operative assessment  Comments: Elective glidescope. Recent COVID, small mouth opening.

## 2020-11-08 NOTE — Anesthesia Preprocedure Evaluation (Addendum)
Anesthesia Evaluation  Patient identified by MRN, date of birth, ID band Patient awake    Reviewed: Allergy & Precautions, NPO status , Patient's Chart, lab work & pertinent test results  Airway Mallampati: III  TM Distance: <3 FB Neck ROM: Full  Mouth opening: Limited Mouth Opening  Dental  (+) Teeth Intact, Dental Advisory Given   Pulmonary sleep apnea and Continuous Positive Airway Pressure Ventilation ,    breath sounds clear to auscultation       Cardiovascular negative cardio ROS   Rhythm:Regular Rate:Normal     Neuro/Psych PSYCHIATRIC DISORDERS Anxiety Depression  Neuromuscular disease    GI/Hepatic negative GI ROS, Neg liver ROS,   Endo/Other  diabetes, Type 2, Oral Hypoglycemic Agents  Renal/GU      Musculoskeletal  (+) Arthritis ,   Abdominal Normal abdominal exam  (+)   Peds  Hematology negative hematology ROS (+)   Anesthesia Other Findings   Reproductive/Obstetrics                            Anesthesia Physical Anesthesia Plan  ASA: 3  Anesthesia Plan: General   Post-op Pain Management:    Induction: Intravenous  PONV Risk Score and Plan: 4 or greater and Ondansetron, Dexamethasone, Midazolam and Scopolamine patch - Pre-op  Airway Management Planned: Oral ETT and Video Laryngoscope Planned  Additional Equipment: None  Intra-op Plan:   Post-operative Plan: Extubation in OR  Informed Consent: I have reviewed the patients History and Physical, chart, labs and discussed the procedure including the risks, benefits and alternatives for the proposed anesthesia with the patient or authorized representative who has indicated his/her understanding and acceptance.     Dental advisory given  Plan Discussed with: CRNA  Anesthesia Plan Comments: (Echo:  1. Left ventricular ejection fraction, by estimation, is 55 to 60%. The  left ventricle has normal function. The left  ventricle has no regional  wall motion abnormalities. Left ventricular diastolic parameters are  consistent with Grade I diastolic  dysfunction (impaired relaxation). There is Abnormal septal motion  secondary to conduction system disease. The average left ventricular  global longitudinal strain is -22.2 %.  2. Right ventricular systolic function is normal. The right ventricular  size is normal. Tricuspid regurgitation signal is inadequate for assessing  PA pressure.  3. The mitral valve is normal in structure. No evidence of mitral valve  regurgitation. No evidence of mitral stenosis.  4. Probable forme fruste bicuspid aortic valve. Partial fusion of right  and left coronary cusps. The aortic valve is abnormal. Aortic valve  regurgitation is mild to moderate. Mild aortic valve stenosis. Aortic  valve mean gradient measures 12.0 mmHg.  5. Aortic dilatation noted. There is mild dilatation of the aortic root  measuring 39 mm. Ascending aorta is not well visualized for comparison to  prior exam.  6. The inferior vena cava is normal in size with greater than 50%  respiratory variability, suggesting right atrial pressure of 3 mmHg. )      Anesthesia Quick Evaluation

## 2020-11-09 ENCOUNTER — Inpatient Hospital Stay (HOSPITAL_COMMUNITY): Payer: Commercial Managed Care - PPO

## 2020-11-09 ENCOUNTER — Encounter (HOSPITAL_COMMUNITY): Payer: Self-pay | Admitting: Orthopedic Surgery

## 2020-11-09 DIAGNOSIS — M5410 Radiculopathy, site unspecified: Secondary | ICD-10-CM

## 2020-11-09 DIAGNOSIS — M5126 Other intervertebral disc displacement, lumbar region: Secondary | ICD-10-CM

## 2020-11-09 DIAGNOSIS — I824Y2 Acute embolism and thrombosis of unspecified deep veins of left proximal lower extremity: Secondary | ICD-10-CM

## 2020-11-09 DIAGNOSIS — J45902 Unspecified asthma with status asthmaticus: Secondary | ICD-10-CM

## 2020-11-09 DIAGNOSIS — I2693 Single subsegmental pulmonary embolism without acute cor pulmonale: Secondary | ICD-10-CM

## 2020-11-09 DIAGNOSIS — M79606 Pain in leg, unspecified: Secondary | ICD-10-CM

## 2020-11-09 LAB — ECHOCARDIOGRAM COMPLETE BUBBLE STUDY
AR max vel: 1.96 cm2
AV Area VTI: 1.51 cm2
AV Area mean vel: 2.11 cm2
AV Mean grad: 12 mmHg
AV Peak grad: 23.2 mmHg
Ao pk vel: 2.41 m/s
Area-P 1/2: 3.77 cm2
S' Lateral: 3.6 cm

## 2020-11-09 LAB — CBC
HCT: 38.6 % (ref 36.0–46.0)
Hemoglobin: 12.2 g/dL (ref 12.0–15.0)
MCH: 28.6 pg (ref 26.0–34.0)
MCHC: 31.6 g/dL (ref 30.0–36.0)
MCV: 90.4 fL (ref 80.0–100.0)
Platelets: 187 10*3/uL (ref 150–400)
RBC: 4.27 MIL/uL (ref 3.87–5.11)
RDW: 14.3 % (ref 11.5–15.5)
WBC: 9.7 10*3/uL (ref 4.0–10.5)
nRBC: 0 % (ref 0.0–0.2)

## 2020-11-09 LAB — GLUCOSE, CAPILLARY
Glucose-Capillary: 140 mg/dL — ABNORMAL HIGH (ref 70–99)
Glucose-Capillary: 150 mg/dL — ABNORMAL HIGH (ref 70–99)
Glucose-Capillary: 185 mg/dL — ABNORMAL HIGH (ref 70–99)
Glucose-Capillary: 176 mg/dL — ABNORMAL HIGH (ref 70–99)

## 2020-11-09 LAB — BRAIN NATRIURETIC PEPTIDE: B Natriuretic Peptide: 129.4 pg/mL — ABNORMAL HIGH (ref 0.0–100.0)

## 2020-11-09 MED ORDER — RIVAROXABAN 20 MG PO TABS: 20.0000 mg | ORAL_TABLET | Freq: Every day | ORAL | Status: DC

## 2020-11-09 MED ORDER — IOHEXOL 350 MG/ML SOLN
100.0000 mL | Freq: Once | INTRAVENOUS | Status: AC | PRN
  Administered 2020-11-09: 100 mL via INTRAVENOUS

## 2020-11-09 MED ORDER — HYDROMORPHONE HCL 1 MG/ML IJ SOLN
2.0000 mg | Freq: Once | INTRAMUSCULAR | Status: AC
  Administered 2020-11-09: 2 mg via INTRAVENOUS
  Filled 2020-11-09: qty 2

## 2020-11-09 MED ORDER — ENOXAPARIN SODIUM 150 MG/ML IJ SOSY
130.0000 mg | PREFILLED_SYRINGE | Freq: Two times a day (BID) | INTRAMUSCULAR | Status: AC
  Administered 2020-11-09 – 2020-11-10 (×2): 130 mg via SUBCUTANEOUS
  Filled 2020-11-09 (×2): qty 0.86

## 2020-11-09 MED ORDER — RIVAROXABAN 20 MG PO TABS
20.0000 mg | ORAL_TABLET | Freq: Every day | ORAL | Status: DC
  Administered 2020-11-10: 20 mg via ORAL
  Filled 2020-11-09: qty 1

## 2020-11-09 MED FILL — Thrombin (Recombinant) For Soln 20000 Unit: CUTANEOUS | Qty: 1 | Status: AC

## 2020-11-09 NOTE — Evaluation (Signed)
Physical Therapy Evaluation Patient Details Name: Erika Woods MRN: [id number] DOB: 1965-11-10 Today's Date: 11/09/2020   History of Present Illness  Pt is a 55 y.o. female who presented 6/27 with debilitating L leg pain and known L5-S1 herniation. MRI shows chronic changes with mild worsening. S/p L5-S1 laminectomy/decompression micodiscectomy 6/30. PMH: PE/DVT anticoagulated on Xarelto, DM, thoracic aortic aneurysm, sleep apnea, osteoarthritis of knee, PTSD.   Clinical Impression  Pt presents with condition above and deficits mentioned below, see PT Problem List. PTA, she was independent, living with her spouse and adult daughter in a 1-level house with 4 STE and bil handrails, and working as a Administrator (off during summer). Currently, pt is requiring up to minA for bed mobility. Unable to assess OOB mobility as MD arrived during session and verbally reported desire to place pt on bedrest to PT as there was concern for possible PE. Pt reporting improved sensation and strength in her L leg since surgery, thus expect good progress with OOB mobility. Will recommend Outpatient PT at this time, but pending progress with OOB mobility pt may not need follow-up PT. Will continue to follow acutely.    Follow Up Recommendations Supervision for mobility/OOB;Outpatient PT (vs No PT follow-up pending OOB mobility)    Equipment Recommendations  Rolling walker with 5" wheels;3in1 (PT)    Recommendations for Other Services       Precautions / Restrictions Precautions Precautions: Fall;Back Precaution Booklet Issued: Yes (comment) Precaution Comments: Reviewed spinal precautions, pt recalled BLT upon arrival without cues Required Braces or Orthoses: Spinal Brace Spinal Brace: Lumbar corset;Applied in sitting position Restrictions Weight Bearing Restrictions: No      Mobility  Bed Mobility Overal bed mobility: Needs Assistance Bed Mobility: Rolling;Sidelying to Sit;Sit to  Sidelying Rolling: Min guard Sidelying to sit: Min assist;HOB elevated       General bed mobility comments: Educated pt on log roll technique, min guard. HOB elevated with rails down to simulate home set-up, minA towards end of ascension of trunk to sit up EOB.    Transfers                 General transfer comment: Deferred as MD arrived during session with concerns for possible PE and verbally ordered for pt to be on bedrest at this time until cleared  Ambulation/Gait             General Gait Details: Deferred as MD arrived during session with concerns for possible PE and verbally ordered for pt to be on bedrest at this time until cleared  Stairs            Wheelchair Mobility    Modified Rankin (Stroke Patients Only)       Balance Overall balance assessment: Needs assistance Sitting-balance support: No upper extremity supported;Feet supported Sitting balance-Leahy Scale: Good Sitting balance - Comments: Able to donn brace in sitting with supervision.       Standing balance comment: Deferred as MD arrived during session with concerns for possible PE and verbally ordered for pt to be on bedrest at this time until cleared                             Pertinent Vitals/Pain Pain Assessment: Faces Faces Pain Scale: Hurts little more Pain Location: back; surgical site Pain Descriptors / Indicators: Discomfort;Grimacing;Guarding;Operative site guarding Pain Intervention(s): Limited activity within patient's tolerance;Monitored during session;Repositioned    Home Living Family/patient expects to be  discharged to:: Private residence Living Arrangements: Spouse/significant other;Children Available Help at Discharge: Family;Friend(s);Available 24 hours/day Type of Home: House Home Access: Stairs to enter Entrance Stairs-Rails: Can reach both Entrance Stairs-Number of Steps: 4 Home Layout: One level Home Equipment: Crutches;Other (comment)  (adjustable bed; reacher)      Prior Function Level of Independence: Independent         Comments: Works as a Administrator, off during summer.     Hand Dominance   Dominant Hand: Right    Extremity/Trunk Assessment   Upper Extremity Assessment Upper Extremity Assessment: Overall WFL for tasks assessed    Lower Extremity Assessment Lower Extremity Assessment: LLE deficits/detail LLE Deficits / Details: decreased sensation at lateral aspect of distal foot; reports improved strength and sensation since surgery, unable to formally assess due to new bedrest orders received verbally by MD during session LLE Sensation: decreased light touch    Cervical / Trunk Assessment Cervical / Trunk Assessment: Other exceptions Cervical / Trunk Exceptions: s/p spinal sx  Communication   Communication: No difficulties  Cognition Arousal/Alertness: Awake/alert Behavior During Therapy: WFL for tasks assessed/performed Overall Cognitive Status: Within Functional Limits for tasks assessed                                        General Comments General comments (skin integrity, edema, etc.): SpO2 as low as 88% on RA but quickly rebounds to 90s    Exercises     Assessment/Plan    PT Assessment Patient needs continued PT services  PT Problem List Decreased strength;Decreased activity tolerance;Decreased balance;Decreased mobility;Decreased knowledge of use of DME;Decreased safety awareness;Decreased knowledge of precautions;Pain       PT Treatment Interventions DME instruction;Gait training;Stair training;Functional mobility training;Therapeutic activities;Therapeutic exercise;Balance training;Neuromuscular re-education;Patient/family education    PT Goals (Current goals can be found in the Care Plan section)  Acute Rehab PT Goals Patient Stated Goal: to go to bathroom PT Goal Formulation: With patient/family Time For Goal Achievement: 11/23/20 Potential to  Achieve Goals: Good    Frequency Min 5X/week   Barriers to discharge        Co-evaluation               AM-PAC PT "6 Clicks" Mobility  Outcome Measure Help needed turning from your back to your side while in a flat bed without using bedrails?: A Little Help needed moving from lying on your back to sitting on the side of a flat bed without using bedrails?: A Little Help needed moving to and from a bed to a chair (including a wheelchair)?: A Little Help needed standing up from a chair using your arms (e.g., wheelchair or bedside chair)?: A Little Help needed to walk in hospital room?: A Little Help needed climbing 3-5 steps with a railing? : A Lot 6 Click Score: 17    End of Session Equipment Utilized During Treatment: Oxygen;Back brace Activity Tolerance: Patient tolerated treatment well;Other (comment) (limited by new orders to be on bedrest to rule out PE) Patient left: in bed;with call bell/phone within reach;with nursing/sitter in room;with family/visitor present Nurse Communication: Mobility status;Other (comment) (MD ordering bedrest to rule out PE) PT Visit Diagnosis: Difficulty in walking, not elsewhere classified (R26.2);Pain Pain - part of body:  (back)    Time: 6283-1517 PT Time Calculation (min) (ACUTE ONLY): 31 min   Charges:   PT Evaluation $PT Eval Moderate Complexity: 1 Mod PT  Treatments $Therapeutic Activity: 8-22 mins        Raymond Gurney, PT, DPT Acute Rehabilitation Services  Pager: [phone] Office: [phone]   Jewel Baize 11/09/2020, 11:52 AM

## 2020-11-09 NOTE — Progress Notes (Signed)
Subjective: 1 Day Post-Op Procedure(s) (LRB): LUMBAR FIVE AND SACRAL ONE LAMINECTOMY/DECOMPRESSION MICRODISCECTOMY 1 LEVEL (N/A) Issues desatting into the 80s yesterday in operating room, PACU, and overnight. Currently sating mid 90s on 2L nasal cannula. Used CPAP over night. Patient reports pain as mild.  Radicular left leg pain significantly improved Post-op. Minimal incisional leg pain. Making good urine, reports she feels as though she is emptying bladder completely. Denies CP, pleuritic pain. Does have a dry cough. Denies calf pain.    Objective: Vital signs in last 24 hours: Temp:  [97.2 F (36.2 C)-98.4 F (36.9 C)] 98 F (36.7 C) (07/01 0323) Pulse Rate:  [72-93] 84 (06/30 2335) Resp:  [13-21] 15 (06/30 2335) BP: (109-130)/(71-94) 129/88 (06/30 2310) SpO2:  [88 %-97 %] 88 % (06/30 2335)  Intake/Output from previous day: 06/30 0701 - 07/01 0700 In: 1350 [I.V.:1000; IV Piggyback:350] Out: 1330 [Urine:1280; Blood:50] Intake/Output this shift: No intake/output data recorded.  No results for input(s): HGB in the last 72 hours. No results for input(s): WBC, RBC, HCT, PLT in the last 72 hours. No results for input(s): NA, K, CL, CO2, BUN, CREATININE, GLUCOSE, CALCIUM in the last 72 hours. No results for input(s): LABPT, INR in the last 72 hours.  Neurologically intact ABD soft Neurovascular intact Sensation intact distally Intact pulses distally Dorsiflexion/Plantar flexion intact Incision: dressing C/D/I No cellulitis present Compartment soft NO LE pitting edmema, no calf tenderness  Assessment/Plan: 1 Day Post-Op Procedure(s) (LRB): LUMBAR FIVE AND SACRAL ONE LAMINECTOMY/DECOMPRESSION MICRODISCECTOMY 1 LEVEL (N/A)  - Pain improved significantly. We will D/C IV pain meds and use oxycodone PRN pain.  - We will get patient up and walking with PT using LSO brace which was delivered.  - D/C fluids as patient is tolerating PO  - She has not had IS for her hospital stay  thus far, we will get her one. I will also order a CXR. Given her medical comorbidities including (PE x2, morbid obesity, recent COVID, sleep apnea) we will place a medical consult for further evaluation of her SOB. - CBD to r/o anemia. Pre-op labs showed normal hgb, no significant blood loss during surgery - Plan to restart home dose of Xarelto tomorrow am. She has been off of this the last few days in preparation for surgery - Hopeful for discharge tomorrow.      Rhodia Albright 11/09/2020, 7:22 AM

## 2020-11-09 NOTE — Progress Notes (Signed)
IR called to pick up pt for x-ray and said that the pt appeared to be on airborne precautions. This RN did not receive this in report, nor did the charge RN. IR will not take the pt if she is on covid precautions, and she will need a portable x-ray if unable to go down. Contacted infection prevention and spoke with Cherrie Speagle. She looked through the pt's chart history and said that Angela Adam did the same yesterday and cleared the pt for the need for precautions. Pt is still flagged as covid +, as her most recent test was on 6/27. Per infection prevention will not have the pt on precautions.   Robina Ade, RN

## 2020-11-09 NOTE — Consult Note (Addendum)
[facility] Medical Consultation  AIDALY CORDNER YFV:[id number] DOB: 03/09/1966 DOA: 11/05/2020 PCP: Dois Davenport, MD   Requesting physician: Venita Lick, MD Date of consultation: 11/09/2020 Reason for consultation: Desaturations and medical management  Impression/Recommendations Active Problems:   Radiculopathy   Lumbar disc herniation    Lumbar disc herniation and radiculopathy status post lumbar 5 and sacral 1 laminectomy/decompression with microdissection: Patient postop day 1. -Per ortho  Acute respiratory failure with hypoxia history of DVT/PE acute DVT and PE: Patient was noted to have desaturations into the 80s intermittently during surgery yesterday and thereafter.  Patient with prior history of vomiting emboli in 2019 and 2020 which patient reports fatigue is her chief complaint.  She had been off of Xarelto since 6/18 and had tested positive for COVID-19 on 6/15.  COVID-19 possibly places patient at increased hypercoagulable risk and the fact that she was hospitalized since the 27th she is at high risk for blood clots.  Other possible causes for the desaturations include atelectasis in the setting morbid obesity. -Continuous pulse oximetry with nasal cannula oxygen as needed to maintain O2 saturation greater than 92 -Strict bedrest for now until work-up negative -Lovenox per pharmacy to start now and resume Xarelto tomorrow morning -Check CT angiogram of the chest with contrast stat -Check Doppler ultrasound of the bilateral lower extremities Addendum: Patient was found to have bilateral PEs with concern for some right heart strain along with a left leg DVT.  Orders placed for an echocardiogram.  Patient should remain on bedrest for 24 to 48 hours after starting anticoagulation.  Records note hypercoagulable work-up from 11/2018 did not note elevated DRVVT that corrected with mixing which would give concern for deficiency of 1 of the common pathway factors (X, V, II or  fibrinogen).  Ultimately patient needs to be on anticoagulation lifelong unless she has a significant bleeding event.  At that time patient would need to be evaluated by vascular surgery for need of IVC filter.  Patient may benefit from following up with hematology in outpatient setting.  Recent COVID-19 infection: Patient initially tested positive on 6/15.  She does not need airborne precautions at this time and is out of the window for any kind of treatment.    Diabetes mellitus type 2: Last available hemoglobin A1c was 6.1 in 2020.  Home medications include metformin 500 mg twice daily. -Continue CBGs before every meal and at bedtime with moderate SSI  OSA on CPAP -Continue CPAP at night  Morbid obesity: BMI 48.28 kg/m. -Continue to recommend dietary and lifestyle modification  I will followup again tomorrow. Please contact me if I can be of assistance in the meanwhile. Thank you for this consultation.  Chief Complaint: Left leg pain  HPI:  Erika Woods is a 55 y.o. female with medical history significant of DVT, PE in 2019 and 2020 on chronic anticoagulation, diabetes mellitus type 2, thoracic aortic aneurysm, morbid obesity, and OSA on CPAP who presented to the emergency department with debilitating left leg pain on 6/27.  Patient was known to have L5-S1 disc herniation for which she had been being followed by Dr. Yevette Edwards in the outpatient setting.  He had become debilitated to the point which she was nonambulatory, using a wheelchair with worsening numbness, and tingling in the left leg.   Patient had previously been on a cruise starting 6/9, her mother had tested positive for COVID-19 on 6/12, and she subsequently tested positive for COVID-19 on 6/15.  After getting off the cruise  patient had been quarantining and had been driven back to [place] prior to 6/18 when she was out of her supply of Xarelto.  She had not restarted the medication prior to being admitted into the  hospital.  Patient notes that during her previous pulmonary embolus she had felt tired and had some intermittent wheezing, but denied any complaints of chest pain and shortness of breath.  The first pulmonary embolus occurred May 2019 about 2 months after she had knee arthroscopy and was thought possibly be provoked.  She was on Xarelto for 6 months and then it was discontinued.  The second PE occurred in July 2020 and at that time she was placed back on Xarelto and recommended to continue for life.  She thinks that they had worked her up for possible causes at that time and nothing showed up specifically from what she can remember.  Patient denied having any issues with bleeding previously in the past.  Patient underwent left L5-S1 disc herniation with radiculopathy with Dr. Shon BatonBrooks on 6/30.  While in the operating room patient had was reported to have desaturations intermittently into the 80s while in the operating room, PACU, and overnight.  She had been placed on 2 L of nasal cannula oxygen and she reports that sequential compression stockings were placed.  Initial plans were to restart Xarelto on 7/2.  Patient reports that she has had some intermittent wheezing and mild nonproductive cough.  Denies having any calf pain, leg swelling, pleuritic pain, or chest pain.  Following the surgery her left leg pain is improved and she only has some numbness on the lateral aspect of her left foot.   Review of Systems  Constitutional:  Negative for fever.  Respiratory:  Negative for shortness of breath.   Cardiovascular:  Negative for chest pain and leg swelling.  Genitourinary:  Negative for dysuria and hematuria.  Musculoskeletal:  Negative for back pain and myalgias.  Neurological:  Positive for sensory change.  All other systems reviewed and are negative.   Past Medical History:  Diagnosis Date   Acute pulmonary embolism ([name]) 11/11/2018   Acute pulmonary embolism with acute cor pulmonale ([name]) 09/16/2017    Acute superficial venous thrombosis of left lower extremity    Aortic insufficiency    Echocardiogram 08/2019: prob bicuspid AoV, mild to mod AI, mild AS (mean 12 mmHg), EF 55-60, no RWMA, Gr 1 DD, GLS -22.2%, normal RVSF, Ao Root 39 mm   Coronary CTA    Coronary CTA 08/2019: Calcium score 0, no evidence of CAD, ascending aorta 4 cm   Diabetes mellitus without complication ([name])    DM (diabetes mellitus) ([name]) 08/18/2011   Impaired fasting glucose    Mood disorder ([name]) 08/18/2011   Obesity, Class III, BMI 40-49.9 (morbid obesity) ([name]) 09/16/2017   Obstructive sleep apnea on CPAP    Osteoarthritis of knee    Personal history of pulmonary embolism    PTSD (post-traumatic stress disorder) 08/18/2011   Pulmonary emboli ([name]) 09/15/2017   Renal disorder    kideny stones   Restless leg syndrome    Severe recurrent major depression without psychotic features ([name]) 02/17/2018   Sleep apnea 08/18/2011   Thoracic aortic aneurysm [facility]([name])    Thoracic aortic aneurysm without rupture [facility]([name])    Past Surgical History:  Procedure Laterality Date   arm surgery Bilateral Skin removal   DILATATION & CURETTAGE/HYSTEROSCOPY WITH MYOSURE N/A 06/11/2018   Procedure: DILATATION & CURETTAGE/HYSTEROSCOPY WITH MYOSURE RESECTION OF ENDOMETRIAL THICKENING;  Surgeon:  Richardean Chimera, MD;  Location: [facility];  Service: Gynecology;  Laterality: N/A;  BMI 56   DILATION AND EVACUATION     ESOPHAGOGASTRODUODENOSCOPY (EGD) WITH PROPOFOL N/A 07/15/2018   Procedure: ESOPHAGOGASTRODUODENOSCOPY (EGD) WITH PROPOFOL;  Surgeon: Willis Modena, MD;  Location: [facility];  Service: Endoscopy;  Laterality: N/A;   ESOPHAGOGASTRODUODENOSCOPY (EGD) WITH PROPOFOL N/A 04/27/2019   Procedure: ESOPHAGOGASTRODUODENOSCOPY (EGD) WITH PROPOFOL;  Surgeon: Willis Modena, MD;  Location: [facility];  Service: Endoscopy;  Laterality: N/A;   IR ABLATE LIVER CRYOABLATION  10/28/2019   IR ABLATE LIVER CRYOABLATION  05/07/2020   IR RADIOLOGIST EVAL & MGMT  10/14/2019    IR RADIOLOGIST EVAL & MGMT  04/03/2020   KIDNEY STONE SURGERY     KNEE SURGERY Right    LUMBAR LAMINECTOMY/DECOMPRESSION MICRODISCECTOMY N/A 11/08/2020   Procedure: LUMBAR FIVE AND SACRAL ONE LAMINECTOMY/DECOMPRESSION MICRODISCECTOMY 1 LEVEL;  Surgeon: Venita Lick, MD;  Location: [facility] OR;  Service: Orthopedics;  Laterality: N/A;   TONSILLECTOMY     Social History:  reports that she has never smoked. She has never used smokeless tobacco. She reports current alcohol use. She reports that she does not use drugs.  Allergies  Allergen Reactions   Sulfa Antibiotics Hives   Family History  Adopted: Yes    Prior to Admission medications   Medication Sig Start Date End Date Taking? Authorizing Provider  BREZTRI AEROSPHERE 160-9-4.8 MCG/ACT AERO Inhale 2 puffs into the lungs 2 (two) times daily as needed (wheezing/shortness of breath). 10/15/20  Yes [provider]  dextromethorphan (DELSYM) 30 MG/5ML liquid Take 15 mg by mouth as needed for cough.   Yes [provider]  doxepin (SINEQUAN) 10 MG capsule Take 50 mg by mouth at bedtime. 03/12/20  Yes [provider]  gabapentin (NEURONTIN) 300 MG capsule Take 300 mg by mouth 3 (three) times daily. 04/01/20  Yes [provider]  guaiFENesin-dextromethorphan (ROBITUSSIN DM) 100-10 MG/5ML syrup Take 5 mLs by mouth every 4 (four) hours as needed for cough.   Yes [provider]  lamoTRIgine (LAMICTAL) 150 MG tablet Take 150 mg by mouth daily after breakfast.  06/30/18  Yes [provider]  metFORMIN (GLUCOPHAGE) 500 MG tablet Take 500 mg by mouth 2 (two) times daily with a meal.  10/28/13  Yes [provider]  methocarbamol (ROBAXIN) 500 MG tablet Take 1 tablet (500 mg total) by mouth every 8 (eight) hours as needed for up to 5 days for muscle spasms. 11/08/20 11/13/20 Yes Venita Lick, MD  ondansetron (ZOFRAN) 4 MG tablet Take 1 tablet (4 mg total) by mouth every 8 (eight) hours as needed for  nausea or vomiting. 11/08/20  Yes Venita Lick, MD  oxyCODONE-acetaminophen (PERCOCET) 10-325 MG tablet Take 1 tablet by mouth every 6 (six) hours as needed for up to 5 days for pain. 11/08/20 11/13/20 Yes Venita Lick, MD  oxymetazoline (AFRIN) 0.05 % nasal spray Place 1 spray into both nostrils 2 (two) times daily as needed for congestion.   Yes [provider]  pseudoephedrine (SUDAFED) 30 MG tablet Take 30 mg by mouth every 4 (four) hours as needed for congestion.   Yes [provider]  rOPINIRole (REQUIP) 4 MG tablet Take 8 mg by mouth at bedtime. 11/03/20  Yes [provider]  TRINTELLIX 20 MG TABS tablet Take 20 mg by mouth daily. 05/13/19  Yes [provider]  rivaroxaban (XARELTO) 20 MG TABS tablet Take 20 mg by mouth daily after breakfast.  Patient not taking: Reported on  11/05/2020  11/07/20  [provider]   Physical Exam:  Constitutional: Morbidly obese female currently in no acute distress Vitals:   11/08/20 2310 11/08/20 2335 11/09/20 0323 11/09/20 0830  BP: 129/88   118/66  Pulse: 85 84  75  Resp: 16 15  19   Temp: 98.2 F (36.8 C)  98 F (36.7 C) 98.6 F (37 C)  TempSrc: Oral   Oral  SpO2: 90% (!) 88%  96%  Weight:      Height:       Eyes: PERRL, lids and conjunctivae normal ENMT: Mucous membranes are moist. Posterior pharynx clear of any exudate or lesions.  Neck: normal, supple, no masses, no thyromegaly Respiratory: Clear to auscultation bilaterally with no significant wheezes or rhonchi appreciated at this time.  O2 saturations around 91%. Cardiovascular: Regular rate and rhythm, no murmurs / rubs / gallops.  No significant lower extremity edema. 2+ pedal pulses. No carotid bruits.  Abdomen: no tenderness, no masses palpated. No hepatosplenomegaly. Bowel sounds positive.  Musculoskeletal: no clubbing / cyanosis. No joint deformity upper and lower extremities. Good ROM, no contractures. Normal muscle tone.  Skin: Surgical  wound noted of the lumbar spine with dressing in place no significant signs of erythema appreciated. Neurologic: CN 2-12 grossly intact.  Able to move all extremities  psychiatric: Normal judgment and insight. Alert and oriented x 3. Normal mood.   Labs on Admission:  Basic Metabolic Panel: Recent Labs  Lab 11/05/20 1314  NA 141  K 4.4  CL 106  CO2 26  GLUCOSE 115*  BUN 20  CREATININE 0.93  CALCIUM 9.5   Liver Function Tests: No results for input(s): AST, ALT, ALKPHOS, BILITOT, PROT, ALBUMIN in the last 168 hours. No results for input(s): LIPASE, AMYLASE in the last 168 hours. No results for input(s): AMMONIA in the last 168 hours. CBC: Recent Labs  Lab 11/05/20 1314 11/09/20 0754  WBC 11.5* 9.7  NEUTROABS 8.7*  --   HGB 13.7 12.2  HCT 43.7 38.6  MCV 91.6 90.4  PLT 272 187   Cardiac Enzymes: No results for input(s): CKTOTAL, CKMB, CKMBINDEX, TROPONINI in the last 168 hours. BNP: Invalid input(s): POCBNP CBG: Recent Labs  Lab 11/08/20 1647 11/08/20 1836 11/08/20 2306 11/09/20 0827  GLUCAP 142* 169* 189* 140*    Radiological Exams on Admission: DG Lumbar Spine 2-3 Views  Result Date: 11/08/2020 CLINICAL DATA:  55 year old female with lumbar localization for L5-S1 laminectomy. EXAM: LUMBAR SPINE-1 VIEW COMPARISON:  Lumbar spine MRI dated 11/05/2020. FINDINGS: Two lateral view images provided. On film 1 there are two localizer markers. The tip of the superior marker is along the inferior margin of the L3 spinous process and the tip of the inferior marker is at the level of the L5 spinous process. On the second image timed 16:24:33, a probe is noted with tip at the level of L5-S1 facet. IMPRESSION: Lateral radiograph of the lumbar spine as described. These results were called by telephone at the time of interpretation on 11/08/2020 at 4:41 pm to the OR nurse, 11/10/2020, who verbally acknowledged these results. Electronically Signed   By: Katrinka Blazing M.D.   On: 11/08/2020  16:42    Chest x-ray: Independently reviewed.  Cardiomegaly appreciated without infiltrate or edema appreciated  Time spent: >45 minutes  Aleysha Meckler A Yisrael Obryan [facility]     If 7PM-7AM, please contact night-coverage

## 2020-11-09 NOTE — Progress Notes (Addendum)
Patient has home unit at beside. Patient is able to place on when ready.

## 2020-11-09 NOTE — Progress Notes (Signed)
Lower extremity venous has been completed.   Preliminary results in CV Proc.   Blanch Media 11/09/2020 1:11 PM

## 2020-11-09 NOTE — Progress Notes (Signed)
Patient has home unit CPAP. Pt needs no assistance with putting on mask.

## 2020-11-09 NOTE — Progress Notes (Signed)
ANTICOAGULATION CONSULT NOTE - Initial Consult  Pharmacy Consult for Lovenox > Xarelto Indication: Hx PE  Allergies  Allergen Reactions   Sulfa Antibiotics Hives    Patient Measurements: Height: 5\' 5"  (165.1 cm) Weight: 131.6 kg (290 lb 2 oz) IBW/kg (Calculated) : 57  Vital Signs: Temp: 98.6 F (37 C) (07/01 0830) Temp Source: Oral (07/01 0830) BP: 118/66 (07/01 0830) Pulse Rate: 75 (07/01 0830)  Labs: Recent Labs    11/09/20 0754  HGB 12.2  HCT 38.6  PLT 187    Estimated Creatinine Clearance: 92.6 mL/min (by C-G formula based on SCr of 0.93 mg/dL).   Medical History: Past Medical History:  Diagnosis Date   Acute pulmonary embolism ([name]) 11/11/2018   Acute pulmonary embolism with acute cor pulmonale ([name]) 09/16/2017   Acute superficial venous thrombosis of left lower extremity    Aortic insufficiency    Echocardiogram 08/2019: prob bicuspid AoV, mild to mod AI, mild AS (mean 12 mmHg), EF 55-60, no RWMA, Gr 1 DD, GLS -22.2%, normal RVSF, Ao Root 39 mm   Coronary CTA    Coronary CTA 08/2019: Calcium score 0, no evidence of CAD, ascending aorta 4 cm   Diabetes mellitus without complication ([name])    DM (diabetes mellitus) ([name]) 08/18/2011   Impaired fasting glucose    Mood disorder ([name]) 08/18/2011   Obesity, Class III, BMI 40-49.9 (morbid obesity) ([name]) 09/16/2017   Obstructive sleep apnea on CPAP    Osteoarthritis of knee    Personal history of pulmonary embolism    PTSD (post-traumatic stress disorder) 08/18/2011   Pulmonary emboli ([name]) 09/15/2017   Renal disorder    kideny stones   Restless leg syndrome    Severe recurrent major depression without psychotic features ([name]) 02/17/2018   Sleep apnea 08/18/2011   Thoracic aortic aneurysm [facility])    Thoracic aortic aneurysm without rupture ([name])     Assessment: 55 YOF presenting for ortho OP, hx PE on Xarelto PTA which was held for OP.  To start lovenox now and transition to Xarelto tomorrow AM  Goal of Therapy:  Anti-Xa  level 0.6-1 units/ml 4hrs after LMWH dose given Monitor platelets by anticoagulation protocol: Yes   Plan:  Lovenox 130 mg SQ q 12h x 2 doses Restart Xarelto @1000  7/2 - ordered Monitor renal function, CT angio, s/s bleeding   , PharmD Clinical Pharmacist ED Pharmacist Phone # [phone] 11/09/2020 12:22 PM

## 2020-11-10 DIAGNOSIS — M549 Dorsalgia, unspecified: Secondary | ICD-10-CM

## 2020-11-10 LAB — GLUCOSE, CAPILLARY
Glucose-Capillary: 132 mg/dL — ABNORMAL HIGH (ref 70–99)
Glucose-Capillary: 117 mg/dL — ABNORMAL HIGH (ref 70–99)
Glucose-Capillary: 131 mg/dL — ABNORMAL HIGH (ref 70–99)
Glucose-Capillary: 146 mg/dL — ABNORMAL HIGH (ref 70–99)

## 2020-11-10 LAB — HEMOGLOBIN A1C
Hgb A1c MFr Bld: 6.2 % — ABNORMAL HIGH (ref 4.8–5.6)
Mean Plasma Glucose: 131 mg/dL

## 2020-11-10 MED ORDER — RIVAROXABAN 20 MG PO TABS
20.0000 mg | ORAL_TABLET | Freq: Every day | ORAL | Status: DC
  Administered 2020-11-11 – 2020-11-12 (×2): 20 mg via ORAL
  Filled 2020-11-10 (×2): qty 1

## 2020-11-10 MED ORDER — MAGNESIUM CITRATE PO SOLN
0.5000 | Freq: Once | ORAL | Status: AC
  Administered 2020-11-10: 0.5 via ORAL
  Filled 2020-11-10: qty 296

## 2020-11-10 MED ORDER — HALOPERIDOL LACTATE 5 MG/ML IJ SOLN: 5.0000 mg | Freq: Once | INTRAMUSCULAR | Status: DC

## 2020-11-10 MED ORDER — RIVAROXABAN 20 MG PO TABS: 20.0000 mg | ORAL_TABLET | Freq: Every day | ORAL | Status: DC

## 2020-11-10 MED ORDER — RIVAROXABAN 15 MG PO TABS: 15.0000 mg | ORAL_TABLET | Freq: Two times a day (BID) | ORAL | Status: DC

## 2020-11-10 MED ORDER — HALOPERIDOL LACTATE 5 MG/ML IJ SOLN: 2.0000 mg | Freq: Four times a day (QID) | INTRAMUSCULAR | Status: DC | PRN

## 2020-11-10 NOTE — Progress Notes (Signed)
[facility] Progress Note  Patient: Erika Woods    OIZ:[id number]  DOA: 11/05/2020     Date of Service: the patient was seen and examined on 11/10/2020  Brief hospital course: Past medical history of DVT, PE on chronic anticoagulation, type II DM, thoracic aneurysm, morbid obesity, OSA on CPAP.  Presents with complaints of left leg pain on 6/27.  Underwent laminectomy. Found to have DVT and PE. Currently plan is transition to p.o. anticoagulation and monitor.  Subjective: No chest pain.  No shortness of breath.  No nausea no vomiting.  Still on oxygen.  Leg pain improving.  Assessment and Plan: 1.  Acute respiratory failure with hypoxia Acute DVT and acute submassive pulmonary embolism with RV strain Patient noted to have desaturation into 80s around admission as well as during the surgery. Currently on 2 LPM oxygen. Has chronic anticoagulation for recurrent DVT PE. Due to being on the cruise, contracting COVID she has not taken her Xarelto since 6/18.  Due to COVID patient has been mostly in the bed and not moving.  Patient travel between [place] and [place] back-and-forth between 6/15 and 6/23.  She may have taken 1 or 2 doses of Xarelto on 6/22 and 6/23. Thus basically Patient has been off of anticoagulation since 6/18.  Started having pain in her left leg.  Starts having shortness of breath on presentation and found to have a left leg DVT as well as submassive PE.  Suspect this is a provoked event based on circumstances.  Although concern is that this is an acute DVT. This is not a Xarelto failure. Ideally patient would benefit from reloading of anticoagulation.  But she has undergone spine surgery.  At risk for hematoma. Patient received Lovenox therapeutic dose x2 every 12 hours and is currently on Xarelto. Patient does not require bedrest given that she has distal DVT only and is already on anticoagulation. Echocardiogram shows no evidence of RV failure. Will  likely require oxygen on discharge.  2.  COVID-19 infection On 6/15.  Currently out of isolation.  Does not require isolation.  Does not require any therapy right now.  3.  Type II DM Currently on sliding scale insulin.  Monitor.  4.  OSA on CPAP Continue CPAP nightly.  5.  Morbid obesity Placing the patient at high risk of poor outcome.  6.  Diabetic neuropathy On gabapentin.  Continue. Scheduled Meds:  docusate sodium  100 mg Oral BID   gabapentin  300 mg Oral TID   insulin aspart  0-15 Units Subcutaneous TID WC   insulin aspart  0-5 Units Subcutaneous QHS   [START ON 11/11/2020] rivaroxaban  20 mg Oral Q supper   sodium chloride flush  3 mL Intravenous Q12H   Continuous Infusions:  sodium chloride     methocarbamol (ROBAXIN) IV     PRN Meds: acetaminophen **OR** acetaminophen, menthol-cetylpyridinium **OR** phenol, methocarbamol **OR** methocarbamol (ROBAXIN) IV, ondansetron **OR** ondansetron (ZOFRAN) IV, oxyCODONE, oxyCODONE, polyethylene glycol, sodium chloride flush  Body mass index is 48.28 kg/m.        DVT Prophylaxis:   SCD's Start: 11/08/20 2018 rivaroxaban (XARELTO) tablet 20 mg    Advance goals of care discussion: Pt is Full code.  Family Communication: no family was present at bedside, at the time of interview.   Data Reviewed: I have personally reviewed and interpreted daily labs, tele strips, imaging. No labs available.  Physical Exam:  General: Appear in mild distress, no Rash; Oral Mucosa Clear, moist.  no Abnormal Neck Mass Or lumps, Conjunctiva normal  Cardiovascular: S1 and S2 Present, no Murmur, Respiratory: increased respiratory effort, Bilateral Air entry present and bilateral  Crackles, no wheezes Abdomen: Bowel Sound present, Soft and no tenderness Extremities: trace Pedal edema Neurology: alert and oriented to time, place, and person affect appropriate. no new focal deficit Gait not checked due to patient safety concerns  Vitals:    11/10/20 0400 11/10/20 0836 11/10/20 1122 11/10/20 1538  BP:  112/64 120/74 140/67  Pulse: 75 74 71 99  Resp: 18 20 15 20   Temp:  98.3 F (36.8 C) 98.8 F (37.1 C) 98.4 F (36.9 C)  TempSrc:  Oral Oral Oral  SpO2: 96% 97% 97% 94%  Weight:      Height:        Time spent: 35 minutes. I reviewed all nursing notes, pharmacy notes, vitals, pertinent old records. I have discussed plan of care as described above with RN.  Author: , MD [facility] 11/10/2020 7:23 PM  To reach On-call, see care teams to locate the attending and reach out via [email or web address]. Between 7PM-7AM, please contact night-coverage If you still have difficulty reaching the attending provider, please page the [facility] (Director on Call) for [facility] on amion for assistance.

## 2020-11-10 NOTE — Progress Notes (Signed)
Patient has home unit CPAP. Patient does not need any assistance in putting on and off mask.

## 2020-11-10 NOTE — Evaluation (Signed)
Occupational Therapy Evaluation Patient Details Name: Erika Woods MRN: [id number] DOB: 06/01/1965 Today's Date: 11/10/2020    History of Present Illness 55 y.o. female who presented 6/27 with debilitating L leg pain and known L5-S1 herniation. S/p L5-S1 laminectomy/decompression micodiscectomy 6/30. New dx 11/09/20 BIL PE  PMH: PE/DVT anticoagulated on Xarelto, DM, thoracic aortic aneurysm, sleep apnea, osteoarthritis of knee, PTSD.   Clinical Impression   Patient is s/p back surg surgery resulting in functional limitations due to the deficits listed below (see OT problem list). Pt currently with decreased oxygen with movement and requires 2 L due to desat 86% RA. Pt needs extended time to rebound. Pt with AE and good understanding of [place]. Pt progressing well but will benefit from [facility] handout/ education next session.  Patient will benefit from skilled OT acutely to increase independence and safety with ADLS to allow discharge home.     Follow Up Recommendations  No OT follow up    Equipment Recommendations  Other (comment) (RW)    Recommendations for Other Services       Precautions / Restrictions Precautions Precautions: Back Precaution Comments: back booklet reviewed for adls      Mobility Bed Mobility Overal bed mobility: Needs Assistance Bed Mobility: Rolling;Supine to Sit Rolling: Mod assist Sidelying to sit: Mod assist       General bed mobility comments: has bed that will elevate. needs (A) to elevate trunk from surface. pt needs min cues for sequenec. pt pulling on bed rail that does not have at home    Transfers Overall transfer level: Needs assistance Equipment used: 1 person hand held assist Transfers: Sit to/from Stand Sit to Stand: Min assist              Balance                                           ADL either performed or assessed with clinical judgement   ADL Overall ADL's : Needs assistance/impaired Eating/Feeding:  Independent   Grooming: Wash/dry hands;Min guard;Standing                   Toilet Transfer: Minimal assistance;BSC;Grab bars   Toileting- Clothing Manipulation and Hygiene: Minimal assistance;Adhering to back precautions;With adaptive equipment;Sit to/from stand Toileting - Clothing Manipulation Details (indicate cue type and reason): toilet aide pt has purchased does not hold paper well. advised to get tongs Tub/ Shower Transfer: Minimal Radiation protection practitioner Details (indicate cue type and reason): seat if respiratory continues and requries o2 for home d/c Functional mobility during ADLs: Minimal assistance (hr elevated and oxygen dropped so 2 person (A) back for safety to get o2 on patient)       Vision Baseline Vision/History: Wears glasses Wears Glasses: At all times       Perception     Praxis      Pertinent Vitals/Pain Pain Assessment: Faces Faces Pain Scale: Hurts little more Pain Location: incision Pain Descriptors / Indicators: Discomfort;Cramping Pain Intervention(s): Monitored during session;Repositioned     Hand Dominance Right   Extremity/Trunk Assessment Upper Extremity Assessment Upper Extremity Assessment: Overall WFL for tasks assessed   Lower Extremity Assessment Lower Extremity Assessment: Defer to PT evaluation   Cervical / Trunk Assessment Cervical / Trunk Assessment: Other exceptions Cervical / Trunk Exceptions: s/p spinal sx   Communication Communication Communication: No difficulties   Cognition Arousal/Alertness: Awake/alert  Behavior During Therapy: WFL for tasks assessed/performed Overall Cognitive Status: Within Functional Limits for tasks assessed                                     General Comments  HR 130 RR52 O2 86=  oxygen reapplied and rebounding with rest to 90% 2L  pt did not desat with bathroom transfers. OT was leaving session for PT to continue when pt changed oxygen and needed (A) again     Exercises     Shoulder Instructions      Home Living Family/patient expects to be discharged to:: Private residence Living Arrangements: Spouse/significant other;Children Available Help at Discharge: Family;Friend(s);Available 24 hours/day Type of Home: House Home Access: Stairs to enter [employer] of Steps: 4 Entrance Stairs-Rails: Can reach both Home Layout: One level     Bathroom Shower/Tub: Producer, television/film/video: Handicapped height     Home Equipment: Merchant navy officer;Other (comment) (toilet arm rest frame off amazon) Adaptive Equipment: Reacher;Other (Comment) (toilet aide) Additional Comments: recommending toilet tongs and reacher that is dressing reacher      Prior Functioning/Environment Level of Independence: Independent        Comments: drives        OT Problem List: Decreased activity tolerance;Decreased knowledge of precautions;Obesity;Pain      OT Treatment/Interventions: Self-care/ADL training;Therapeutic exercise;Energy conservation;DME and/or AE instruction;Therapeutic activities;Patient/family education;Balance training    OT Goals(Current goals can be found in the care plan section) Acute Rehab OT Goals Patient Stated Goal: to go to bathroom OT Goal Formulation: With patient Time For Goal Achievement: 11/24/20 Potential to Achieve Goals: Good  OT Frequency: Min 2X/week   Barriers to D/C:            Co-evaluation PT/OT/SLP Co-Evaluation/Treatment: Yes Reason for Co-Treatment: For patient/therapist safety   OT goals addressed during session: ADL's and self-care;Proper use of Adaptive equipment and DME;Strengthening/ROM      AM-PAC OT "6 Clicks" Daily Activity     Outcome Measure Help from another person eating meals?: None Help from another person taking care of personal grooming?: None Help from another person toileting, which includes using toliet, bedpan, or urinal?: A Little Help from another person bathing  (including washing, rinsing, drying)?: A Little Help from another person to put on and taking off regular upper body clothing?: None Help from another person to put on and taking off regular lower body clothing?: A Little 6 Click Score: 21   End of Session Equipment Utilized During Treatment: Oxygen Nurse Communication: Mobility status;Precautions  Activity Tolerance: Patient tolerated treatment well Patient left: in chair;with call bell/phone within reach;with chair alarm set  OT Visit Diagnosis: Unsteadiness on feet (R26.81);Pain                Time: 7209-4709 OT Time Calculation (min): 39 min Charges:  OT General Charges $OT Visit: 1 Visit OT Evaluation $OT Eval Moderate Complexity: 1 Mod OT Treatments $Self Care/Home Management : 8-22 mins   Brynn, OTR/L  Acute Rehabilitation Services Pager: [phone] Office: [phone] .   Mateo Flow 11/10/2020, 4:05 PM

## 2020-11-10 NOTE — Progress Notes (Signed)
Received verbal orders from Dr. Shon Baton this am to give pt 0.5 bottle mag citrate and fleets enema if pt cleared from bedrest by triad. Dr. Allena Katz cleared pt and she was seen by therapies. Colace and miralax already given this am, verbal order placed for mag citrate, and previously ordered fleets given to pt at this time.   Robina Ade, RN

## 2020-11-10 NOTE — Plan of Care (Signed)
  Problem: Clinical Measurements: Goal: Ability to maintain clinical measurements within normal limits will improve Outcome: Progressing   Problem: Clinical Measurements: Goal: Will remain free from infection Outcome: Progressing   Problem: Clinical Measurements: Goal: Diagnostic test results will improve Outcome: Progressing   Problem: Clinical Measurements: Goal: Cardiovascular complication will be avoided Outcome: Progressing   Problem: Clinical Measurements: Goal: Respiratory complications will improve Outcome: Progressing   

## 2020-11-10 NOTE — Progress Notes (Addendum)
ANTICOAGULATION CONSULT NOTE - Initial Consult  Pharmacy Consult for Lovenox > Xarelto Indication: Hx PE and recurrent  Allergies  Allergen Reactions   Sulfa Antibiotics Hives    Patient Measurements: Height: 5\' 5"  (165.1 cm) Weight: 131.6 kg (290 lb 2 oz) IBW/kg (Calculated) : 57  Vital Signs: Temp: 98.8 F (37.1 C) (07/02 1122) Temp Source: Oral (07/02 1122) BP: 120/74 (07/02 1122) Pulse Rate: 71 (07/02 1122)  Labs: Recent Labs    11/09/20 0754  HGB 12.2  HCT 38.6  PLT 187     Estimated Creatinine Clearance: 92.6 mL/min (by C-G formula based on SCr of 0.93 mg/dL).   Medical History: Past Medical History:  Diagnosis Date   Acute pulmonary embolism ([name]) 11/11/2018   Acute pulmonary embolism with acute cor pulmonale ([name]) 09/16/2017   Acute superficial venous thrombosis of left lower extremity    Aortic insufficiency    Echocardiogram 08/2019: prob bicuspid AoV, mild to mod AI, mild AS (mean 12 mmHg), EF 55-60, no RWMA, Gr 1 DD, GLS -22.2%, normal RVSF, Ao Root 39 mm   Coronary CTA    Coronary CTA 08/2019: Calcium score 0, no evidence of CAD, ascending aorta 4 cm   Diabetes mellitus without complication ([name])    DM (diabetes mellitus) ([name]) 08/18/2011   Impaired fasting glucose    Mood disorder ([name]) 08/18/2011   Obesity, Class III, BMI 40-49.9 (morbid obesity) ([name]) 09/16/2017   Obstructive sleep apnea on CPAP    Osteoarthritis of knee    Personal history of pulmonary embolism    PTSD (post-traumatic stress disorder) 08/18/2011   Pulmonary emboli ([name]) 09/15/2017   Renal disorder    kideny stones   Restless leg syndrome    Severe recurrent major depression without psychotic features ([name]) 02/17/2018   Sleep apnea 08/18/2011   Thoracic aortic aneurysm [facility])    Thoracic aortic aneurysm without rupture ([name])     Assessment: 55 YOF presenting for ortho OP, hx PE on Xarelto PTA which was held for OP.    Pt is positive for PE again on CT and DVT. We will reload her with  Xarelto since she was off of it since 6/18 and with new thrombosis.   Addendum  Ortho would like to use maintenance dose Xarelto instead.    Plan:  Xarelto 20mg  PO qsupper  7/18, PharmD, [place], AAHIVP, CPP Infectious Disease Pharmacist 11/10/2020 1:02 PM

## 2020-11-10 NOTE — Plan of Care (Signed)
  Problem: Health Behavior/Discharge Planning: Goal: Ability to manage health-related needs will improve 11/10/2020 0240 by Ashok Croon, RN Outcome: Progressing 11/10/2020 0231 by Ashok Croon, RN Outcome: Progressing   Problem: Clinical Measurements: Goal: Ability to maintain clinical measurements within normal limits will improve 11/10/2020 0240 by Ashok Croon, RN Outcome: Progressing 11/10/2020 0231 by Ashok Croon, RN Outcome: Progressing   Problem: Clinical Measurements: Goal: Will remain free from infection 11/10/2020 0240 by Ashok Croon, RN Outcome: Progressing 11/10/2020 0231 by Ashok Croon, RN Outcome: Progressing

## 2020-11-10 NOTE — Progress Notes (Signed)
    Subjective: Procedure(s) (LRB): LUMBAR FIVE AND SACRAL ONE LAMINECTOMY/DECOMPRESSION MICRODISCECTOMY 1 LEVEL (N/A) 2 Days Post-Op  Patient reports pain as 2 on 0-10 scale.  Reports decreased leg pain reports incisional back pain   Positive void Negative bowel movement Positive flatus Negative chest pain or shortness of breath  Objective: Vital signs in last 24 hours: Temp:  [98 F (36.7 C)-98.8 F (37.1 C)] 98 F (36.7 C) (07/02 0329) Pulse Rate:  [70-92] 75 (07/02 0400) Resp:  [9-21] 18 (07/02 0400) BP: (93-118)/(62-67) 93/67 (07/02 0329) SpO2:  [92 %-97 %] 96 % (07/02 0400)  Intake/Output from previous day: 07/01 0701 - 07/02 0700 In: 840 [P.O.:840] Out: -   Labs: Recent Labs    11/09/20 0754  WBC 9.7  RBC 4.27  HCT 38.6  PLT 187   No results for input(s): NA, K, CL, CO2, BUN, CREATININE, GLUCOSE, CALCIUM in the last 72 hours. No results for input(s): LABPT, INR in the last 72 hours.  Physical Exam: Neurologically intact ABD soft Intact pulses distally Incision: dressing C/D/I and no drainage Compartment soft Body mass index is 48.28 kg/m.   Assessment/Plan: Patient stable  Continue mobilization with physical therapy Continue care  1.  Events of yesterday have been noted.  Patient has chronic DVT which most likely has caused her PE.  Right 2.  Fortunately, the patient is relatively asymptomatic as a relates to her PE.  She has been on additional bedrest now since surgery.  She denies any incontinence of bowel or bladder or perianal dysesthesias. 3.  Patient has done very well from the surgical standpoint.  She has 5/5 motor strength in lower EXTR only her numbness is improving and preoperative radicular leg pain is resolved.  She has a negative straight leg raise test.  Her only major complaint is the incisional back pain. 4.  Patient has received 2 doses of Lovenox and there is been no evidence of bleeding at the incision site or hematoma formation.   We will continue to monitor. 5.  Patient will start her oral anticoagulation medication today.  Will encourage out of bed and ambulation if cleared by the medical team.  If she is cleared to be out of bed then we will also address her constipation with a half bottle of mag citrate and fleets enema. 6.  Plan on discharge to home either Sunday or Monday depending upon how she is able to ambulate and recommendations from physical therapy.   Venita Lick, MD [facility] [phone]

## 2020-11-10 NOTE — Progress Notes (Signed)
Physical Therapy Treatment Patient Details Name: Erika Woods MRN: [id number] DOB: 1965/06/28 Today's Date: 11/10/2020    History of Present Illness 55 y.o. female who presented 6/27 with debilitating L leg pain and known L5-S1 herniation. S/p L5-S1 laminectomy/decompression micodiscectomy 6/30. New dx 11/09/20 BIL PE  PMH: PE/DVT anticoagulated on Xarelto, DM, thoracic aortic aneurysm, sleep apnea, osteoarthritis of knee, PTSD.    PT Comments    The pt was eager to mobilize this afternoon after removal of bedrest orders. The pt was able to complete bed mobility with min-modA and cues for use of log roll. The pt was then able to complete sit-stand and short distance ambulation in the room with minA through HHA to steady with no overt LOB or significant change in vitals. After ~35 ft of hallway ambulation, the pt's HR increased to 120-130s with SpO2 to 86% on RA. The pt was able to recover to 88% on RA with guided breathing, required 3L O2 and seated rest to recover into 90s. The pt will continue to benefit from skilled PT acutely to progress OOB ambulation tolerance, stability, and decreased need for UE support. Would need supplemental O2 to return home at this time.     Follow Up Recommendations  Supervision for mobility/OOB;Outpatient PT     Equipment Recommendations  Rolling walker with 5" wheels;3in1 (PT)    Recommendations for Other Services       Precautions / Restrictions Precautions Precautions: Back Precaution Booklet Issued: Yes (comment) Precaution Comments: back booklet reviewed for adls Required Braces or Orthoses: Spinal Brace Spinal Brace: Lumbar corset;Applied in sitting position Restrictions Weight Bearing Restrictions: No    Mobility  Bed Mobility Overal bed mobility: Needs Assistance Bed Mobility: Rolling;Supine to Sit Rolling: Mod assist Sidelying to sit: Mod assist       General bed mobility comments: modA to complete roll with spinal precautions  maintained, modA to elevate trunk and move BLE to EOB. pt has bed that elevates    Transfers Overall transfer level: Needs assistance Equipment used: 1 person hand held assist Transfers: Stand Pivot Transfers;Sit to/from Stand Sit to Stand: Min assist Stand pivot transfers: Min assist       General transfer comment: minA to power up and steady in standing. The pt was able to take pivoting steps with maintaining spinal precautions with HHA  Ambulation/Gait Ambulation/Gait assistance: Min assist Gait Distance (Feet): 10 Feet (+ 45 + 35 ft) Assistive device: 1 person hand held assist Gait Pattern/deviations: Step-through pattern;Decreased stride length;Wide base of support Gait velocity: decreased Gait velocity interpretation: <1.31 ft/sec, indicative of household ambulator General Gait Details: pt with small steps, minimal clearance and slightly wide BOS, but no overt LOB. HR to 120s, SpO2 dropped to 86% on RA. pt only able to recover to 88% on RA with guided breathing. 3L O2 applied.      Balance Overall balance assessment: Needs assistance Sitting-balance support: No upper extremity supported;Feet supported Sitting balance-Leahy Scale: Good Sitting balance - Comments: Able to donn brace in sitting with supervision.   Standing balance support: Single extremity supported Standing balance-Leahy Scale: Fair Standing balance comment: static stand without UE support, minA through HHA to ambulate                            Cognition Arousal/Alertness: Awake/alert Behavior During Therapy: WFL for tasks assessed/performed Overall Cognitive Status: Within Functional Limits for tasks assessed  Exercises      General Comments General comments (skin integrity, edema, etc.): HR to 120-130s with ambulation, SpO2 from 90s down to 86% on RA. Pt able to recover to 88% on RA with standing rest and guided breathing. 3L O2  applied, pt recovered to 90s once achieved seated rest with 3L O2      Pertinent Vitals/Pain Pain Assessment: Faces Faces Pain Scale: Hurts little more Pain Location: incision Pain Descriptors / Indicators: Discomfort;Cramping Pain Intervention(s): Limited activity within patient's tolerance;Monitored during session;Repositioned    Home Living Family/patient expects to be discharged to:: Private residence Living Arrangements: Spouse/significant other;Children Available Help at Discharge: Family;Friend(s);Available 24 hours/day Type of Home: House Home Access: Stairs to enter Entrance Stairs-Rails: Can reach both Home Layout: One level Home Equipment: Adaptive equipment;Other (comment) (toilet arm rest frame off amaz[employer]) Additional Comments: recommending toilet tongs and reacher that is dressing reacher    Prior Function Level of Independence: Independent      Comments: drives   PT Goals (current goals can now be found in the care plan section) Acute Rehab PT Goals Patient Stated Goal: to go to bathroom PT Goal Formulation: With patient/family Time For Goal Achievement: 11/23/20 Potential to Achieve Goals: Good Progress towards PT goals: Progressing toward goals    Frequency    Min 5X/week      PT Plan Current plan remains appropriate    Co-evaluation PT/OT/SLP Co-Evaluation/Treatment: Yes Reason for Co-Treatment: Complexity of the patient's impairments (multi-system involvement);For patient/therapist safety;To address functional/ADL transfers PT goals addressed during session: Mobility/safety with mobility;Balance;Strengthening/ROM OT goals addressed during session: ADL's and self-care;Proper use of Adaptive equipment and DME;Strengthening/ROM      AM-PAC PT "6 Clicks" Mobility   Outcome Measure  Help needed turning from your back to your side while in a flat bed without using bedrails?: A Little Help needed moving from lying on your back to sitting on the side  of a flat bed without using bedrails?: A Little Help needed moving to and from a bed to a chair (including a wheelchair)?: A Little Help needed standing up from a chair using your arms (e.g., wheelchair or bedside chair)?: A Little Help needed to walk in hospital room?: A Little Help needed climbing 3-5 steps with a railing? : A Lot 6 Click Score: 17    End of Session Equipment Utilized During Treatment: Oxygen;Back brace Activity Tolerance: Patient tolerated treatment well;Patient limited by fatigue (SOB) Patient left: in chair;with call bell/phone within reach;with chair alarm set Nurse Communication: Mobility status (O2) PT Visit Diagnosis: Difficulty in walking, not elsewhere classified (R26.2);Pain     Time: 4665-9935 PT Time Calculation (min) (ACUTE ONLY): 39 min  Charges:  $Gait Training: 8-22 mins                     Rolm Baptise, PT, DPT   Acute Rehabilitation Department Pager #: [phone]   Gaetana Michaelis 11/10/2020, 4:21 PM

## 2020-11-11 ENCOUNTER — Inpatient Hospital Stay (HOSPITAL_COMMUNITY): Payer: Commercial Managed Care - PPO

## 2020-11-11 LAB — BASIC METABOLIC PANEL
Anion gap: 8 (ref 5–15)
BUN: 16 mg/dL (ref 6–20)
CO2: 31 mmol/L (ref 22–32)
Calcium: 8.7 mg/dL — ABNORMAL LOW (ref 8.9–10.3)
Chloride: 99 mmol/L (ref 98–111)
Creatinine, Ser: 0.96 mg/dL (ref 0.44–1.00)
GFR, Estimated: >60 mL/min (ref >60)
Glucose, Bld: 107 mg/dL — ABNORMAL HIGH (ref 70–99)
Potassium: 4.5 mmol/L (ref 3.5–5.1)
Sodium: 138 mmol/L (ref 135–145)

## 2020-11-11 LAB — CBC
HCT: 37.4 % (ref 36.0–46.0)
Hemoglobin: 11.4 g/dL — ABNORMAL LOW (ref 12.0–15.0)
MCH: 28.4 pg (ref 26.0–34.0)
MCHC: 30.5 g/dL (ref 30.0–36.0)
MCV: 93.3 fL (ref 80.0–100.0)
Platelets: 219 10*3/uL (ref 150–400)
RBC: 4.01 MIL/uL (ref 3.87–5.11)
RDW: 15 % (ref 11.5–15.5)
WBC: 9.3 10*3/uL (ref 4.0–10.5)
nRBC: 0 % (ref 0.0–0.2)

## 2020-11-11 LAB — GLUCOSE, CAPILLARY
Glucose-Capillary: 119 mg/dL — ABNORMAL HIGH (ref 70–99)
Glucose-Capillary: 112 mg/dL — ABNORMAL HIGH (ref 70–99)
Glucose-Capillary: 100 mg/dL — ABNORMAL HIGH (ref 70–99)
Glucose-Capillary: 167 mg/dL — ABNORMAL HIGH (ref 70–99)

## 2020-11-11 LAB — URINALYSIS, COMPLETE (UACMP) WITH MICROSCOPIC
Bacteria, UA: NONE SEEN
Bilirubin Urine: NEGATIVE
Glucose, UA: NEGATIVE mg/dL
Hgb urine dipstick: NEGATIVE
Ketones, ur: NEGATIVE mg/dL
Leukocytes,Ua: NEGATIVE
Nitrite: NEGATIVE
Protein, ur: NEGATIVE mg/dL
Specific Gravity, Urine: 1.012 (ref 1.005–1.030)
pH: 7 (ref 5.0–8.0)

## 2020-11-11 MED ORDER — BUTALBITAL-APAP-CAFFEINE 50-325-40 MG PO TABS
1.0000 | ORAL_TABLET | Freq: Once | ORAL | Status: AC
  Administered 2020-11-11: 1 via ORAL
  Filled 2020-11-11: qty 1

## 2020-11-11 MED ORDER — FUROSEMIDE 20 MG PO TABS
20.0000 mg | ORAL_TABLET | Freq: Once | ORAL | Status: AC
  Administered 2020-11-11: 20 mg via ORAL
  Filled 2020-11-11: qty 1

## 2020-11-11 NOTE — Progress Notes (Signed)
Erika Woods  MRN: [id number] DOB/Age: [birth year] 55 y.o. Physician: Lynnea Maizes, M.D. 3 Days Post-Op Procedure(s) (LRB): LUMBAR FIVE AND SACRAL ONE LAMINECTOMY/DECOMPRESSION MICRODISCECTOMY 1 LEVEL (N/A)  Subjective: Resting comfortably in bed this morning.  Describes some very mild incisional low back pain.  Leg pain resolved.  Reports no shortness of breath while resting in bed.  She does report shortness of breath with some desaturation when she gets up and goes to the bathroom.  Reports having a bowel movement this a.m. Vital Signs Temp:  [98.2 F (36.8 C)-102.7 F (39.3 C)] 98.2 F (36.8 C) (07/03 0729) Pulse Rate:  [71-112] 81 (07/03 0729) Resp:  [15-20] 15 (07/03 0729) BP: (102-140)/(66-77) 114/74 (07/03 0729) SpO2:  [94 %-97 %] 95 % (07/03 0729)  Lab Results Recent Labs    11/09/20 0754 11/11/20 0547  WBC 9.7 9.3  HGB 12.2 11.4*  HCT 38.6 37.4  PLT 187 219   BMET Recent Labs    11/11/20 0547  NA 138  K 4.5  CL 99  CO2 31  GLUCOSE 107*  BUN 16  CREATININE 0.96  CALCIUM 8.7*   No results found for: INR   Exam  Inspection of the lumbar dressing shows it to be clean and dry.  She has good strength to manual muscle testing in both lower extremities.  Negative straight leg raise.    Plan Mobilize with physical therapy.  Anticoagulation has been resumed.  Discussed vital importance of frequent utilization of incentive spirometer. Rilee Wendling M Kinley Ferrentino 11/11/2020, 10:09 AM   Contact # [phone]

## 2020-11-11 NOTE — Progress Notes (Signed)
[facility] Progress Note  Patient: Erika Woods    CVU:[id number]  DOA: 11/05/2020     Date of Service: the patient was seen and examined on 11/11/2020  Brief hospital course: Past medical history of DVT, PE on chronic anticoagulation, type II DM, thoracic aneurysm, morbid obesity, OSA on CPAP.  Presents with complaints of left leg pain on 6/27.  Underwent laminectomy. Found to have DVT and PE. Currently plan is transition to p.o. anticoagulation and monitor.  Subjective: Had fever last night.  This morning he reports some occipital headache.  No nausea no vomiting.  No diarrhea no constipation.  No burning urination.  No cough.  Assessment and Plan: 1.  Acute respiratory failure with hypoxia Acute DVT and acute submassive pulmonary embolism with RV strain Patient noted to have desaturation into 80s during the surgery. Currently on 2 LPM oxygen. Has chronic anticoagulation for recurrent DVT PE. Due to being on the cruise, contracting COVID she has not taken her Xarelto since 6/18. Due to COVID patient has been mostly in the bed and not moving. Patient travel between [place] and [place] back-and-forth between 6/15 and 6/23.  She may have taken 1 or 2 doses of Xarelto on 6/22 and 6/23. Thus basically Patient has been off of anticoagulation since 6/18.  Started having pain in her left leg.  Starts having shortness of breath on presentation and found to have a left leg DVT as well as submassive PE.  Suspect this is a provoked event based on circumstances.  This is an acute DVT. This is not a Xarelto failure. Ideally patient would benefit from reloading of anticoagulation.   But she has undergone spine surgery.  At risk for hematoma. Patient received Lovenox therapeutic dose x2 every 12 hours and is currently on Xarelto. Patient does not require bedrest given that she has distal DVT only and is already on anticoagulation. Echocardiogram shows no evidence of RV failure. Will  likely require oxygen on discharge.  2.  COVID-19 infection On 6/15.  Currently out of isolation.  Does not require isolation.  Does not require any therapy right now.  3.  Type II DM Currently on sliding scale insulin.  Monitor.  4.  OSA on CPAP Continue CPAP nightly.  5.  Morbid obesity Placing the patient at high risk of poor outcome.  6.  Diabetic neuropathy On gabapentin.  Continue.  7.  Fever. Patient had a fever of 102.7 on 7/2. She had headache and felt tired.  Chest x-ray unremarkable. Urinalysis unremarkable. Patient does not have any new symptoms right now. Suspect this is typical postop fever or PE associated fever and does not require any treatment right now.  Scheduled Meds:  docusate sodium  100 mg Oral BID   gabapentin  300 mg Oral TID   insulin aspart  0-15 Units Subcutaneous TID WC   insulin aspart  0-5 Units Subcutaneous QHS   rivaroxaban  20 mg Oral Q supper   sodium chloride flush  3 mL Intravenous Q12H   Continuous Infusions:  sodium chloride     methocarbamol (ROBAXIN) IV     PRN Meds: acetaminophen **OR** acetaminophen, menthol-cetylpyridinium **OR** phenol, methocarbamol **OR** methocarbamol (ROBAXIN) IV, ondansetron **OR** ondansetron (ZOFRAN) IV, oxyCODONE, oxyCODONE, polyethylene glycol, sodium chloride flush  Body mass index is 48.28 kg/m.        DVT Prophylaxis:   SCD's Start: 11/08/20 2018 rivaroxaban (XARELTO) tablet 20 mg    Advance goals of care discussion: Pt is Full code.  Family Communication: no family was present at bedside, at the time of interview.   Data Reviewed: I have personally reviewed and interpreted daily labs, tele strips, imaging. Hemoglobin stable.  Serum creatinine stable.  Potassium sodium stable.  Urine analysis shows no evidence of pyuria.  Chest x-ray reviewed personally, no atelectasis or pneumonia.  Physical Exam:  General: Appear in mild distress, no Rash; Oral Mucosa Clear, moist. no Abnormal Neck  Mass Or lumps, Conjunctiva normal  Cardiovascular: S1 and S2 Present, no Murmur, Respiratory: good respiratory effort, Bilateral Air entry present and CTA, no Crackles, no wheezes Abdomen: Bowel Sound present, Soft and no tenderness Extremities: no Pedal edema Neurology: alert and oriented to time, place, and person affect appropriate. no new focal deficit Gait not checked due to patient safety concerns  Vitals:   11/11/20 0500 11/11/20 0729 11/11/20 1058 11/11/20 1700  BP:  114/74 117/63 114/74  Pulse:  81 74 84  Resp:  15 17 14   Temp: 99.1 F (37.3 C) 98.2 F (36.8 C) 98 F (36.7 C) 98.1 F (36.7 C)  TempSrc: Oral Oral Oral Oral  SpO2:  95% 92% 95%  Weight:      Height:        Time spent: 35 minutes. I reviewed all nursing notes, pharmacy notes, vitals, pertinent old records. I have discussed plan of care as described above with RN.  Author: , MD [facility] 11/11/2020 7:21 PM  To reach On-call, see care teams to locate the attending and reach out via [email or web address]. Between 7PM-7AM, please contact night-coverage If you still have difficulty reaching the attending provider, please page the [facility] (Director on Call) for [facility] on amion for assistance.

## 2020-11-11 NOTE — Progress Notes (Signed)
Patient has home CPAP unit set up at bedside. No assistance needed from Respiratory at this time.

## 2020-11-12 LAB — GLUCOSE, CAPILLARY
Glucose-Capillary: 104 mg/dL — ABNORMAL HIGH (ref 70–99)
Glucose-Capillary: 97 mg/dL (ref 70–99)

## 2020-11-12 MED ORDER — RIVAROXABAN 20 MG PO TABS: 20.0000 mg | ORAL_TABLET | Freq: Every day | ORAL | 0 refills | Status: DC

## 2020-11-12 MED ORDER — OXYCODONE-ACETAMINOPHEN 10-325 MG PO TABS: 1.0000 | ORAL_TABLET | Freq: Four times a day (QID) | ORAL | 0 refills | Status: AC | PRN

## 2020-11-12 MED ORDER — ONDANSETRON HCL 4 MG PO TABS: 4.0000 mg | ORAL_TABLET | Freq: Three times a day (TID) | ORAL | 0 refills | Status: DC | PRN

## 2020-11-12 MED ORDER — FUROSEMIDE 20 MG PO TABS: 20.0000 mg | ORAL_TABLET | Freq: Every day | ORAL | 0 refills | Status: DC | PRN

## 2020-11-12 MED ORDER — POLYETHYLENE GLYCOL 3350 17 G PO PACK: 17.0000 g | PACK | Freq: Every day | ORAL | 0 refills | Status: DC | PRN

## 2020-11-12 MED ORDER — METHOCARBAMOL 500 MG PO TABS: 500.0000 mg | ORAL_TABLET | Freq: Three times a day (TID) | ORAL | 0 refills | Status: AC | PRN

## 2020-11-12 NOTE — Progress Notes (Signed)
Erika Woods  MRN: [id number] DOB/Age: 1966-04-08 55 y.o. 4 Days Post-Op Procedure(s) (LRB): LUMBAR FIVE AND SACRAL ONE LAMINECTOMY/DECOMPRESSION MICRODISCECTOMY 1 LEVEL (N/A)  Subjective: Resting comfortably in bed this morning.  No new complaints.  Reports no shortness of breath while resting in bed.  She does report shortness of breath with some desaturation when she gets up and goes to the bathroom.  Vital Signs Temp:  [98.1 F (36.7 C)-98.7 F (37.1 C)] 98.4 F (36.9 C) (07/04 0834) Pulse Rate:  [78-102] 88 (07/04 0834) Resp:  [14-19] 18 (07/04 0834) BP: (110-154)/(67-94) 116/80 (07/04 0834) SpO2:  [90 %-95 %] 93 % (07/04 0834)  Lab Results Recent Labs    11/11/20 0547  WBC 9.3  HGB 11.4*  HCT 37.4  PLT 219    BMET Recent Labs    11/11/20 0547  NA 138  K 4.5  CL 99  CO2 31  GLUCOSE 107*  BUN 16  CREATININE 0.96  CALCIUM 8.7*    No results found for: INR   Exam  Inspection of the lumbar dressing shows it to be clean and dry.  She has good strength to manual muscle testing in both lower extremities.      Plan Mobilize with physical therapy.  Anticoagulation has been resumed.  Discussed vital importance of frequent utilization of incentive spirometer. D/C home once cleared by PT and home oxygen ordered and available Darrick Grinder 11/12/2020, 10:59 AM   Contact # [phone]

## 2020-11-12 NOTE — TOC Transition Note (Signed)
Transition of Care (TOC) - CM/SW Discharge Note Donn Pierini RN,BSN Transitions of Care Unit 4NP (non trauma) - RN Case Manager See Treatment Team for direct Phone #    Patient Details  Name: Erika Woods MRN: [id number] Date of Birth: 1966/02/01  Transition of Care [facility]) CM/SW Contact:  Darrold Span, RN Phone Number: 11/12/2020, 1:44 PM   Clinical Narrative:    Pt stable for transition home today, orders have been placed for home 02 needs. CM called and spoke to pt over the phone regarding d/c needs. Discussed home 02 needs as well as other DME needs- per pt she needs a RW as well declines 3n1 or other DME. Choice offered for DME agency- pt states she does not have a preference and is agreeable to using in house provider- [facility].   Also discussed outpt therapy needs- pt reports she had bee going to the Ortho office outpt therapy prior to surgery and would like to continue there however needs to check on transportation and appointment availability with them. Pt to call tomorrow to check on this and will f/u regarding where she will go for outpt therapy needs as well as calling Dr. Shon Baton office for any referral needed.   Call made to [facility] for DME needs- RW and home 02 arranged- DME to be delivered to room prior to discharge.    Final next level of care: OP Rehab Barriers to Discharge: No Barriers Identified   Patient Goals and CMS Choice Patient states their goals for this hospitalization and ongoing recovery are:: return home CMS Medicare.gov Compare Post Acute Care list provided to:: Patient Choice offered to / list presented to : Patient  Discharge Placement                 Home      Discharge Plan and Services   Discharge Planning Services: CM Consult Post Acute Care Choice: Durable Medical Equipment          DME Arranged: Walker rolling, Oxygen DME Agency: [facility] Date DME Agency Contacted: 11/12/20 Time DME Agency Contacted: 1344 Representative  spoke with at DME Agency: Velna Hatchet HH Arranged: NA HH Agency: NA        Social Determinants of Health (SDOH) Interventions     Readmission Risk Interventions Readmission Risk Prevention Plan 11/12/2020  Post Dischage Appt Complete  Medication Screening Complete  Transportation Screening Complete  Some recent data might be hidden

## 2020-11-12 NOTE — Progress Notes (Signed)
Patient ambulated around the unit 3 times on RA, oxygen saturations remained 92-97% on RA.  At rest, oxygen sat remaining above 92%.  NAD noted with ambulation.

## 2020-11-12 NOTE — Progress Notes (Signed)
SATURATION QUALIFICATIONS: (This note is used to comply with regulatory documentation for home oxygen)  Patient Saturations on Room Air at Rest = 94%  Patient Saturations on Room Air while Ambulating = 88%  Patient Saturations on 2 Liters of oxygen while Ambulating = 90%  Please briefly explain why patient needs home oxygen: To maintain oxygen saturations at 90% or above during ambulation.  Lillia Pauls, PT, DPT Acute Rehabilitation Services Pager [phone] Office [phone]

## 2020-11-12 NOTE — Plan of Care (Signed)

## 2020-11-12 NOTE — Progress Notes (Signed)
[facility] Consultation Progress Note  Patient: Erika Woods UTM:[id number]   PCP: Dois Davenport, MD DOB: 1965-08-01   DOA: 11/05/2020   DOS: 11/12/2020   Date of Service: the patient was seen and examined on 11/12/2020 Primary service: Venita Lick, MD   Brief hospital course: Past medical history of DVT, PE on chronic anticoagulation, type II DM, thoracic aneurysm, morbid obesity, OSA on CPAP.  Presents with complaints of left leg pain on 6/27.  Underwent laminectomy. Found to have DVT and PE. Currently pt is medically ready for discharge.  Subjective: no fever in last 24 hours. No chest pain, now off of oxygen at rest with sats in 96%. No edema, no nausea, no diarrhea no focal deficit  Assessment and Plan: 1.  Acute respiratory failure with hypoxia Acute DVT and acute submassive pulmonary embolism with RV strain Patient noted to have desaturation into 80s during the surgery. Was on 2 LPM oxygen. Now on room air at rest. Will check walking sats. Has chronic anticoagulation for recurrent DVT PE. Due to being on the cruise and contracting/isolating for COVID, she has not taken her Xarelto since 6/18.  Due to COVID patient has been mostly in the bed and not moving. Patient drove between [place] and [place] back-and-forth between 6/15 and 6/23. She may have taken 1 or 2 doses of Xarelto on 6/22 and 6/23. Thus basically Patient has been off of anticoagulation since 6/18.  Started having pain in her left leg. Starts having shortness of breath in OR and found to have a left leg DVT as well as submassive PE.   Suspect this is a provoked event based on circumstances.  This is an acute DVT. This is not a Xarelto failure. Ideally patient would benefit from reloading of anticoagulation.   But she has undergone spine surgery and is at risk for hematoma per surgery. Patient received Lovenox therapeutic dose x2 every 12 hours and is currently on Xarelto maintenance  therapy. Patient does not require bedrest given that she has distal DVT only and is already on anticoagulation. Echocardiogram shows no evidence of RV failure.   2.  COVID-19 infection First positive On 6/15. Currently out of isolation window and therapy window. Does not require any therapy right now.   3.  Type II DM controlled without long term insulin use, with neuropathy Currently on sliding scale insulin.  Monitor. Resume metformin    4.  OSA on CPAP Continue CPAP nightly.   5.  Morbid obesity Placing the patient at high risk of poor outcome.   6.  Diabetic neuropathy On gabapentin.  Continue.   7.  Fever. Patient had a fever of 102.7 on 7/2. Afebrile since then.  She had headache and felt tired.  Chest x-ray unremarkable. Urinalysis unremarkable. Patient does not have any new symptoms right now. Suspect this is postop fever or PE associated fever and does not require any treatment right now.  8. Mild volume overload.  Possible HFPEF Echocardiogram shows EF 60-65%, no diastolic dysfunction and no RV issues. No WMA and no valvular issues.  CXR showed mild vascular congestion. Probably post op volume overload from fluid resuscitation. No prior history.  Will provide lasix PRN   Recommendation on discharge: Follow up with PCP in 1 week Lasix 20 mg PRN  Diet: Cardiac diet DVT Prophylaxis: Therapeutic Anticoagulation with xarelto  Family Communication: no family was present at bedside, at the time of interview.   Disposition: We will sign off at present,  please call us again as needed.  Other Consultants: none Procedures: Left L5-S1 disc herniation with radiculopathy LUMBAR FIVE AND SACRAL ONE LAMINECTOMY/DECOMPRESSION MICRODISCECTOMY 1 Antibiotics: Anti-infectives (From admission, onward)    Start     Dose/Rate Route Frequency Ordered Stop   11/09/20 0000  ceFAZolin (ANCEF) IVPB 2g/100 mL premix        2 g 200 mL/hr over 30 Minutes Intravenous Every 8 hours  11/08/20 2017 11/09/20 0546   11/08/20 1515  ceFAZolin (ANCEF) IVPB 3g/100 mL premix        3 g 200 mL/hr over 30 Minutes Intravenous  Once 11/08/20 1514 11/08/20 1616       Objective: Physical Exam: Vitals:   11/11/20 2014 11/11/20 2313 11/12/20 0317 11/12/20 0834  BP: (!) 154/94 117/69 110/67 116/80  Pulse: (!) 102 87 78 88  Resp: 19 15 17 18   Temp: 98.7 F (37.1 C) 98.2 F (36.8 C) 98.7 F (37.1 C) 98.4 F (36.9 C)  TempSrc: Oral Oral Oral Oral  SpO2: 90% 90% 93% 93%  Weight:      Height:        Intake/Output Summary (Last 24 hours) at 11/12/2020 0902 Last data filed at 11/12/2020 01/13/2021 Gross per 24 hour  Intake 3 ml  Output 1600 ml  Net -1597 ml   Filed Weights   11/05/20 2104  Weight: 131.6 kg   General: Appear in no distress, no Rash; Oral Mucosa Clear, moist. no Abnormal Neck Mass Or lumps, Conjunctiva normal  Cardiovascular: S1 and S2 Present, no Murmur, Respiratory: good respiratory effort, Bilateral Air entry present and Clear to Auscultation, no Crackles, no wheezes Abdomen: Bowel Sound present, Soft and no tenderness Extremities: no Pedal edema,  Neurology: alert and oriented to time, place, and person affect appropriate. no new focal deficit Gait not checked due to patient safety concerns  Data Reviewed: CBC: Recent Labs  Lab 11/05/20 1314 11/09/20 0754 11/11/20 0547  WBC 11.5* 9.7 9.3  NEUTROABS 8.7*  --   --   HGB 13.7 12.2 11.4*  HCT 43.7 38.6 37.4  MCV 91.6 90.4 93.3  PLT 272 187 219   Basic Metabolic Panel: Recent Labs  Lab 11/05/20 1314 11/11/20 0547  NA 141 138  K 4.4 4.5  CL 106 99  CO2 26 31  GLUCOSE 115* 107*  BUN 20 16  CREATININE 0.93 0.96  CALCIUM 9.5 8.7*   Liver Function Tests: No results for input(s): AST, ALT, ALKPHOS, BILITOT, PROT, ALBUMIN in the last 168 hours. No results for input(s): LIPASE, AMYLASE in the last 168 hours. No results for input(s): AMMONIA in the last 168 hours. Cardiac Enzymes: No results for  input(s): CKTOTAL, CKMB, CKMBINDEX, TROPONINI in the last 168 hours. BNP (last 3 results) Recent Labs    11/09/20 1511  BNP 129.4*   CBG: Recent Labs  Lab 11/11/20 0757 11/11/20 1241 11/11/20 1727 11/11/20 2153 11/12/20 0829  GLUCAP 119* 112* 100* 167* 104*   Recent Results (from the past 240 hour(s))  Resp Panel by RT-PCR (Flu A&B, Covid) Nasopharyngeal Swab     Status: Abnormal   Collection Time: 11/05/20  1:14 PM   Specimen: Nasopharyngeal Swab; Nasopharyngeal(NP) swabs in vial transport medium  Result Value Ref Range Status   SARS Coronavirus 2 by RT PCR POSITIVE (A) NEGATIVE Final    Comment: RESULT CALLED TO, READ BACK BY AND VERIFIED WITH: 11/07/20 RN 1450 11/05/20 A BROWNNG (NOTE) SARS-CoV-2 target nucleic acids are DETECTED.  The SARS-CoV-2 RNA is generally  detectable in upper respiratory specimens during the acute phase of infection. Positive results are indicative of the presence of the identified virus, but do not rule out bacterial infection or co-infection with other pathogens not detected by the test. Clinical correlation with patient history and other diagnostic information is necessary to determine patient infection status. The expected result is Negative.  Fact Sheet for Patients: BloggerCourse.com  Fact Sheet for Healthcare Providers: SeriousBroker.it  This test is not yet approved or cleared by the [place] FDA and  has been authorized for detection and/or diagnosis of SARS-CoV-2 by FDA under an Emergency Use Authorization (EUA).  This EUA will remain in effect (meaning this test can  be used) for the duration of  the COVID-19 declaration under Section 564(b)(1) of the Act, 21 U.S.C. section 360bbb-3(b)(1), unless the authorization is terminated or revoked sooner.     Influenza A by PCR NEGATIVE NEGATIVE Final   Influenza B by PCR NEGATIVE NEGATIVE Final    Comment: (NOTE) The Xpert Xpress  SARS-CoV-2/FLU/RSV plus assay is intended as an aid in the diagnosis of influenza from Nasopharyngeal swab specimens and should not be used as a sole basis for treatment. Nasal washings and aspirates are unacceptable for Xpert Xpress SARS-CoV-2/FLU/RSV testing.  Fact Sheet for Patients: BloggerCourse.com  Fact Sheet for Healthcare Providers: SeriousBroker.it  This test is not yet approved or cleared by the [place] FDA and has been authorized for detection and/or diagnosis of SARS-CoV-2 by FDA under an Emergency Use Authorization (EUA). This EUA will remain in effect (meaning this test can be used) for the duration of the COVID-19 declaration under Section 564(b)(1) of the Act, 21 U.S.C. section 360bbb-3(b)(1), unless the authorization is terminated or revoked.  Performed at [facility], [address], [place], [place] [address]   Surgical PCR screen     Status: Abnormal   Collection Time: 11/06/20  8:29 AM   Specimen: Nasal Mucosa; Nasal Swab  Result Value Ref Range Status   MRSA, PCR NEGATIVE NEGATIVE Final   Staphylococcus aureus POSITIVE (A) NEGATIVE Final    Comment: (NOTE) The Xpert SA Assay (FDA approved for NASAL specimens in patients 53 years of age and older), is one component of a comprehensive surveillance program. It is not intended to diagnose infection nor to guide or monitor treatment. Performed at [facility], [address] [place], [place] [address]     Studies: DG CHEST PORT 1 VIEW  Result Date: 11/11/2020 CLINICAL DATA:  Shob Possible DVT EXAM: PORTABLE CHEST 1 VIEW COMPARISON:  11/09/2020 FINDINGS: Normal cardiac silhouette. No effusion, infiltrate pneumothorax. No pulmonary infarction identified. Central venous congestion increased from prior. IMPRESSION: Increased is venous congestion compared to prior. No pulmonary infarction identified. Electronically Signed   By: Genevive Bi  M.D.   On: 11/11/2020 11:56     Scheduled Meds:  docusate sodium  100 mg Oral BID   gabapentin  300 mg Oral TID   insulin aspart  0-15 Units Subcutaneous TID WC   insulin aspart  0-5 Units Subcutaneous QHS   rivaroxaban  20 mg Oral Q supper   sodium chloride flush  3 mL Intravenous Q12H   Continuous Infusions:  sodium chloride     methocarbamol (ROBAXIN) IV     PRN Meds: acetaminophen **OR** acetaminophen, menthol-cetylpyridinium **OR** phenol, methocarbamol **OR** methocarbamol (ROBAXIN) IV, ondansetron **OR** ondansetron (ZOFRAN) IV, oxyCODONE, oxyCODONE, polyethylene glycol, sodium chloride flush  Time spent: 35 minutes  Author: Lynden Oxford, MD [facility]  11/12/2020 9:02 AM  To reach On-call, see care teams to locate the attending and reach out to them via [email or web address]. If 7PM-7AM, please contact night-coverage If you still have difficulty reaching the attending provider, please page the [facility] (Director on Call) for [facility] on amion for assistance.

## 2020-11-12 NOTE — Progress Notes (Signed)
Physical Therapy Treatment Patient Details Name: Erika Woods MRN: [id number] DOB: 1965-09-28 Today's Date: 11/12/2020    History of Present Illness 55 y.o. female who presented 6/27 with debilitating L leg pain and known L5-S1 herniation. S/p L5-S1 laminectomy/decompression micodiscectomy 6/30. New dx 11/09/20 BIL PE  PMH: PE/DVT anticoagulated on Xarelto, DM, thoracic aortic aneurysm, sleep apnea, osteoarthritis of knee, PTSD.    PT Comments    Pt progressing well towards her physical therapy goals; exhibiting improved ambulation distance and activity tolerance. Ambulating a total of ~250 ft with a walker and negotiated x 4 steps with a right railing to simulate home entrance. Desaturation to 88% on RA; rebounded to 90% with 2L O2 during ambulation. D/c plan remains appropriate.     Follow Up Recommendations  Supervision for mobility/OOB;Outpatient PT     Equipment Recommendations  Rolling walker with 5" wheels    Recommendations for Other Services       Precautions / Restrictions Precautions Precautions: Back;Other (comment) Precaution Booklet Issued: Yes (comment) Precaution Comments: watch o2 Required Braces or Orthoses: Spinal Brace Spinal Brace: Lumbar corset;Applied in sitting position Restrictions Weight Bearing Restrictions: No    Mobility  Bed Mobility               General bed mobility comments: oob to chair on arrival    Transfers Overall transfer level: Modified independent   Transfers: Sit to/from Stand           General transfer comment: use of bil hands on arm rest  Ambulation/Gait Ambulation/Gait assistance: Supervision Gait Distance (Feet): 250 Feet (150", 100") Assistive device: Rolling walker (2 wheeled) Gait Pattern/deviations: Step-through pattern;Decreased stride length;Wide base of support Gait velocity: decreased   General Gait Details: slow and steady pace, cues for pursed lip breathing and activity pacing. pt ambulating 150 ft,  then an additional 100 ft with a seated rest break in between   Stairs Stairs: Yes Stairs assistance: Supervision Stair Management: One rail Right Number of Stairs: 4 General stair comments: cues for step by step   Wheelchair Mobility    Modified Rankin (Stroke Patients Only)       Balance Overall balance assessment: Needs assistance Sitting-balance support: No upper extremity supported;Feet supported Sitting balance-Leahy Scale: Good     Standing balance support: No upper extremity supported;During functional activity Standing balance-Leahy Scale: Fair                              Cognition Arousal/Alertness: Awake/alert Behavior During Therapy: WFL for tasks assessed/performed Overall Cognitive Status: Within Functional Limits for tasks assessed                                        Exercises      General Comments General comments (skin integrity, edema, etc.): watch 02 sats with adls as pt 90-89% with transfer on RA      Pertinent Vitals/Pain Pain Assessment: Faces Faces Pain Scale: Hurts a little bit Pain Location: incision Pain Descriptors / Indicators: Sore Pain Intervention(s): Monitored during session    Home Living                      Prior Function            PT Goals (current goals can now be found in the care plan section) Acute Rehab  PT Goals Patient Stated Goal: to get a bath before home Potential to Achieve Goals: Good Progress towards PT goals: Progressing toward goals    Frequency    Min 5X/week      PT Plan Current plan remains appropriate    Co-evaluation              AM-PAC PT "6 Clicks" Mobility   Outcome Measure  Help needed turning from your back to your side while in a flat bed without using bedrails?: None Help needed moving from lying on your back to sitting on the side of a flat bed without using bedrails?: None Help needed moving to and from a bed to a chair  (including a wheelchair)?: A Little Help needed standing up from a chair using your arms (e.g., wheelchair or bedside chair)?: A Little Help needed to walk in hospital room?: A Little Help needed climbing 3-5 steps with a railing? : A Little 6 Click Score: 20    End of Session Equipment Utilized During Treatment: Oxygen;Back brace Activity Tolerance: Patient tolerated treatment well Patient left: in chair;with call bell/phone within reach;with chair alarm set Nurse Communication: Mobility status PT Visit Diagnosis: Difficulty in walking, not elsewhere classified (R26.2);Pain     Time: 5364-6803 PT Time Calculation (min) (ACUTE ONLY): 28 min  Charges:  $Therapeutic Activity: 23-37 mins                     Erika Woods, PT, DPT Acute Rehabilitation Services Pager [phone] Office [phone]    Norval Morton 11/12/2020, 3:38 PM

## 2020-11-12 NOTE — Progress Notes (Signed)
Occupational Therapy Treatment Patient Details Name: Erika Woods MRN: [id number] DOB: May 25, 1965 Today's Date: 11/12/2020    History of present illness 55 y.o. female who presented 6/27 with debilitating L leg pain and known L5-S1 herniation. S/p L5-S1 laminectomy/decompression micodiscectomy 6/30. New dx 11/09/20 BIL PE  PMH: PE/DVT anticoagulated on Xarelto, DM, thoracic aortic aneurysm, sleep apnea, osteoarthritis of knee, PTSD.   OT comments  Pt completed shower to demonstrate showering with back precautions with dressing covering being water proof at this time supervision (A). Pt sat 90-92% RA during session with cues to do pursed lip breathing and open door to bathroom to help with venting of the room. Recommendation to dc home without follow up   Follow Up Recommendations  No OT follow up    Equipment Recommendations  Other (comment) (shower seat pt plans to buy on [facility])    Recommendations for Other Services      Precautions / Restrictions Precautions Precautions: Back Required Braces or Orthoses: Spinal Brace Spinal Brace: Lumbar corset;Applied in sitting position       Mobility Bed Mobility               General bed mobility comments: oob to chair on arrival    Transfers Overall transfer level: Modified independent   Transfers: Sit to/from Stand           General transfer comment: use of bil hands on arm rest    Balance                                           ADL either performed or assessed with clinical judgement   ADL Overall ADL's : Needs assistance/impaired     Grooming: Wash/dry face;Wash/dry hands;Modified independent   Upper Body Bathing: Modified independent;Sitting   Lower Body Bathing: Moderate assistance;Sit to/from stand   Upper Body Dressing : Modified independent       Toilet Transfer: Supervision/safety           Functional mobility during ADLs: Supervision/safety General ADL Comments: pt  completing adl and close monitor of O2 Pt using reacher to dress LB supervision this session with cues for use / avoiding bending.     Vision   Additional Comments: wears glasses   Perception     Praxis      Cognition Arousal/Alertness: Awake/alert Behavior During Therapy: WFL for tasks assessed/performed Overall Cognitive Status: Within Functional Limits for tasks assessed                                          Exercises     Shoulder Instructions       General Comments watch 02 sats with adls as pt 90-89% with transfer on RA    Pertinent Vitals/ Pain       Pain Assessment: Faces Faces Pain Scale: Hurts a little bit Pain Location: incision Pain Descriptors / Indicators: Sore  Home Living                                          Prior Functioning/Environment              Frequency  Min 2X/week  Progress Toward Goals  OT Goals(current goals can now be found in the care plan section)  Progress towards OT goals: Progressing toward goals  Acute Rehab OT Goals Patient Stated Goal: to get a bath before home OT Goal Formulation: With patient Time For Goal Achievement: 11/24/20 Potential to Achieve Goals: Good ADL Goals Additional ADL Goal #1: pt will complete sink level adl with oxygen >90 on RA Additional ADL Goal #2: pt will demonstrate 2 EC strategies with adls.  Plan Discharge plan remains appropriate    Co-evaluation                 AM-PAC OT "6 Clicks" Daily Activity     Outcome Measure   Help from another person eating meals?: None Help from another person taking care of personal grooming?: None Help from another person toileting, which includes using toliet, bedpan, or urinal?: A Little Help from another person bathing (including washing, rinsing, drying)?: A Little Help from another person to put on and taking off regular upper body clothing?: None Help from another person to put on and taking  off regular lower body clothing?: A Little 6 Click Score: 21    End of Session    OT Visit Diagnosis: Unsteadiness on feet (R26.81);Pain   Activity Tolerance Patient tolerated treatment well   Patient Left in chair;with call bell/phone within reach;with chair alarm set   Nurse Communication Mobility status;Precautions        Time: 1254-     Charges: OT General Charges $OT Visit: 1 Visit   Erika Woods, OTR/L  Acute Rehabilitation Services Pager: [phone] Office: [phone] .    Mateo Flow 11/12/2020, 12:56 PM

## 2020-11-13 ENCOUNTER — Other Ambulatory Visit: Payer: Self-pay | Admitting: Gastroenterology

## 2020-11-13 DIAGNOSIS — K7460 Unspecified cirrhosis of liver: Secondary | ICD-10-CM

## 2020-11-13 NOTE — Discharge Summary (Signed)
Patient ID: Erika Woods MRN: [id number] DOB/AGE: 1966/05/11 55 y.o.  Admit date: 11/05/2020 Discharge date: 11/12/2020  Admission Diagnoses:  Active Problems:   Radiculopathy   Lumbar disc herniation Bilateral PE  Discharge Diagnoses:  Active Problems:   Radiculopathy   Lumbar disc herniation  status post Procedure(s): LUMBAR FIVE AND SACRAL ONE LAMINECTOMY/DECOMPRESSION MICRODISCECTOMY 1 LEVEL  Past Medical History:  Diagnosis Date   Acute pulmonary embolism ([name]) 11/11/2018   Acute pulmonary embolism with acute cor pulmonale ([name]) 09/16/2017   Acute superficial venous thrombosis of left lower extremity    Aortic insufficiency    Echocardiogram 08/2019: prob bicuspid AoV, mild to mod AI, mild AS (mean 12 mmHg), EF 55-60, no RWMA, Gr 1 DD, GLS -22.2%, normal RVSF, Ao Root 39 mm   Coronary CTA    Coronary CTA 08/2019: Calcium score 0, no evidence of CAD, ascending aorta 4 cm   Diabetes mellitus without complication ([name])    DM (diabetes mellitus) ([name]) 08/18/2011   Impaired fasting glucose    Mood disorder ([name]) 08/18/2011   Obesity, Class III, BMI 40-49.9 (morbid obesity) ([name]) 09/16/2017   Obstructive sleep apnea on CPAP    Osteoarthritis of knee    Personal history of pulmonary embolism    PTSD (post-traumatic stress disorder) 08/18/2011   Pulmonary emboli ([name]) 09/15/2017   Renal disorder    kideny stones   Restless leg syndrome    Severe recurrent major depression without psychotic features ([name]) 02/17/2018   Sleep apnea 08/18/2011   Thoracic aortic aneurysm [facility])    Thoracic aortic aneurysm without rupture ([name])     Surgeries: Procedure(s): LUMBAR FIVE AND SACRAL ONE LAMINECTOMY/DECOMPRESSION MICRODISCECTOMY 1 LEVEL on 11/08/2020   Consultants: Treatment Team:  Venita Lick, MD Hospitalist service  Discharged Condition: Improved  Hospital Course: Erika Woods is an 55 y.o. female who was admitted 11/05/2020 for operative treatment of Lumbar radiculopathy. Patient  failed conservative treatments (please see the history and physical for the specifics) and had severe unremitting pain that affects sleep, daily activities and work/hobbies. After pre-op clearance, the patient was taken to the operating room on 11/08/2020 and underwent  Procedure(s): LUMBAR FIVE AND SACRAL ONE LAMINECTOMY/DECOMPRESSION MICRODISCECTOMY 1 LEVEL.    Patient was given perioperative antibiotics:  Anti-infectives (From admission, onward)    Start     Dose/Rate Route Frequency Ordered Stop   11/09/20 0000  ceFAZolin (ANCEF) IVPB 2g/100 mL premix        2 g 200 mL/hr over 30 Minutes Intravenous Every 8 hours 11/08/20 2017 11/09/20 0546   11/08/20 1515  ceFAZolin (ANCEF) IVPB 3g/100 mL premix        3 g 200 mL/hr over 30 Minutes Intravenous  Once 11/08/20 1514 11/08/20 1616          Patient benefited maximally from hospital stay which was complicated by bilateral PE. At the time of discharge, the patient was urinating/moving their bowels without difficulty, tolerating a regular diet, pain is controlled with oral pain medications and they have been cleared by PT/OT. Hospitalist service indicated that patient was stable for discharge despite the bilateral PE. She has been resumed on anti-coagulation. She has a f/u scheduled with her PCP.  Recent vital signs: Patient Vitals for the past 24 hrs:  BP Temp Temp src Pulse Resp SpO2  11/12/20 1143 118/81 98.3 F (36.8 C) Oral 84 16 94 %     Recent laboratory studies:  Recent Labs    11/11/20 0547  WBC 9.3  HGB 11.4*  HCT 37.4  PLT 219  NA 138  K 4.5  CL 99  CO2 31  BUN 16  CREATININE 0.96  GLUCOSE 107*  CALCIUM 8.7*     Discharge Medications:   Allergies as of 11/12/2020       Reactions   Sulfa Antibiotics Hives        Medication List     TAKE these medications    Breztri Aerosphere 160-9-4.8 MCG/ACT Aero Generic drug: Budeson-Glycopyrrol-Formoterol Inhale 2 puffs into the lungs 2 (two) times daily as needed  (wheezing/shortness of breath).   Delsym 30 MG/5ML liquid Generic drug: dextromethorphan Take 15 mg by mouth as needed for cough.   doxepin 10 MG capsule Commonly known as: SINEQUAN Take 50 mg by mouth at bedtime.   furosemide 20 MG tablet Commonly known as: Lasix Take 1 tablet (20 mg total) by mouth daily as needed for edema (weight gain of >3lbs in 1 day or >5lbs in 2 days.).   gabapentin 300 MG capsule Commonly known as: NEURONTIN Take 300 mg by mouth 3 (three) times daily.   guaiFENesin-dextromethorphan 100-10 MG/5ML syrup Commonly known as: ROBITUSSIN DM Take 5 mLs by mouth every 4 (four) hours as needed for cough.   lamoTRIgine 150 MG tablet Commonly known as: LAMICTAL Take 150 mg by mouth daily after breakfast.   metFORMIN 500 MG tablet Commonly known as: GLUCOPHAGE Take 500 mg by mouth 2 (two) times daily with a meal.   methocarbamol 500 MG tablet Commonly known as: Robaxin Take 1 tablet (500 mg total) by mouth every 8 (eight) hours as needed for up to 5 days for muscle spasms.   ondansetron 4 MG tablet Commonly known as: Zofran Take 1 tablet (4 mg total) by mouth every 8 (eight) hours as needed for nausea or vomiting.   oxyCODONE-acetaminophen 10-325 MG tablet Commonly known as: Percocet Take 1 tablet by mouth every 6 (six) hours as needed for up to 5 days for pain.   oxymetazoline 0.05 % nasal spray Commonly known as: AFRIN Place 1 spray into both nostrils 2 (two) times daily as needed for congestion.   polyethylene glycol 17 g packet Commonly known as: MIRALAX / GLYCOLAX Take 17 g by mouth daily as needed for mild constipation.   pseudoephedrine 30 MG tablet Commonly known as: SUDAFED Take 30 mg by mouth every 4 (four) hours as needed for congestion.   rivaroxaban 20 MG Tabs tablet Commonly known as: XARELTO Take 1 tablet (20 mg total) by mouth daily with supper.   rOPINIRole 4 MG tablet Commonly known as: REQUIP Take 8 mg by mouth at bedtime.    Trintellix 20 MG Tabs tablet Generic drug: vortioxetine HBr Take 20 mg by mouth daily.        Diagnostic Studies: DG Chest 2 View  Result Date: 11/09/2020 CLINICAL DATA:  Shortness of breath EXAM: CHEST - 2 VIEW COMPARISON:  11/11/2018 FINDINGS: The heart size and mediastinal contours are within normal limits. Both lungs are clear. The visualized skeletal structures are unremarkable. IMPRESSION: No active cardiopulmonary disease. Electronically Signed   By: Alcide Clever M.D.   On: 11/09/2020 11:27   DG Lumbar Spine 2-3 Views  Result Date: 11/08/2020 CLINICAL DATA:  55 year old female with lumbar localization for L5-S1 laminectomy. EXAM: LUMBAR SPINE-1 VIEW COMPARISON:  Lumbar spine MRI dated 11/05/2020. FINDINGS: Two lateral view images provided. On film 1 there are two localizer markers. The tip of the superior marker is along the inferior margin of the  L3 spinous process and the tip of the inferior marker is at the level of the L5 spinous process. On the second image timed 16:24:33, a probe is noted with tip at the level of L5-S1 facet. IMPRESSION: Lateral radiograph of the lumbar spine as described. These results were called by telephone at the time of interpretation on 11/08/2020 at 4:41 pm to the OR nurse, Katrinka Blazing, who verbally acknowledged these results. Electronically Signed   By: Elgie Collard M.D.   On: 11/08/2020 16:42   CT Angio Chest Pulmonary Embolism (PE) W or WO Contrast  Result Date: 11/09/2020 CLINICAL DATA:  History of DVTs. EXAM: CT ANGIOGRAPHY CHEST WITH CONTRAST TECHNIQUE: Multidetector CT imaging of the chest was performed using the standard protocol during bolus administration of intravenous contrast. Multiplanar CT image reconstructions and MIPs were obtained to evaluate the vascular anatomy. CONTRAST:  OMNIPAQUE IOHEXOL 350 MG/ML SOLN COMPARISON:  May 12, 2019. FINDINGS: Cardiovascular: Large filling defect is noted in lower lobe branches of right pulmonary  artery consistent with acute pulmonary embolus. RV/LV ratio of 1.2 is noted suggesting right heart strain. Mild cardiomegaly is noted. No pericardial effusion. Probable small embolus seen in lower lobe branch of left pulmonary artery. 4.1 cm ascending thoracic aortic aneurysm is noted. Mediastinum/Nodes: No enlarged mediastinal, hilar, or axillary lymph nodes. Thyroid gland, trachea, and esophagus demonstrate no significant findings. Lungs/Pleura: Lungs are clear. No pleural effusion or pneumothorax. Upper Abdomen: No acute abnormality. Musculoskeletal: No chest wall abnormality. No acute or significant osseous findings. Review of the MIP images confirms the above findings. IMPRESSION: Bilateral pulmonary emboli are noted, with large embolus seen in the distal right pulmonary artery which extends into the lower lobe branches. Positive for acute PE with CT evidence of right heart strain (RV/LV Ratio = 1.2) consistent with at least submassive (intermediate risk) PE. The presence of right heart strain has been associated with an increased risk of morbidity and mortality. Please refer to the "PE Focused" order set in EPIC. Critical Value/emergent results were called by telephone at the time of interpretation on 11/09/2020 at 1:22 pm to provider [facility] , who verbally acknowledged these results. 4.1 cm ascending thoracic aortic aneurysm. Recommend annual imaging followup by CTA or MRA. This recommendation follows 2010 ACCF/AHA/AATS/ACR/ASA/SCA/SCAI/SIR/STS/SVM Guidelines for the Diagnosis and Management of Patients with Thoracic Aortic Disease. Circulation. 2010; 121: N829-F621. Aortic aneurysm NOS (ICD10-I71.9). Electronically Signed   By: Lupita Raider M.D.   On: 11/09/2020 13:23   MR LUMBAR SPINE WO CONTRAST  Result Date: 11/05/2020 CLINICAL DATA:  Low back pain with left leg pain. EXAM: MRI LUMBAR SPINE WITHOUT CONTRAST TECHNIQUE: Multiplanar, multisequence MR imaging of the lumbar spine was performed. No  intravenous contrast was administered. COMPARISON:  MRI lumbar spine 09/30/2020 FINDINGS: Segmentation:  Standard Alignment: Slight retrolisthesis L3-4. Slight anterolisthesis L4-5. Vertebrae:  Negative for fracture or mass Conus medullaris and cauda equina: Conus extends to the L1 level. Conus and cauda equina appear normal. Paraspinal and other soft tissues: Negative for paraspinous mass, adenopathy, or fluid collection. Disc levels: L1-2: Normal disc space with moderate facet degeneration. Negative for stenosis L2-3: Mild disc bulging and moderate facet degeneration. Mild spinal stenosis. L3-4: Mild disc bulging and moderate facet degeneration. Mild spinal stenosis and mild subarticular stenosis bilaterally. L4-5: Mild disc degeneration and disc bulging. Severe facet degeneration with bony overgrowth and bilateral facet joint effusions. Moderate to severe spinal stenosis. Moderate subarticular stenosis bilaterally. No interval change L5-S1: Central and left-sided disc protrusion with left S1  nerve root impingement. There is also right subarticular stenosis with less prominent right S1 nerve root impingement. No interval change from the prior study. IMPRESSION: Mild spinal stenosis L2-3 and L3-4 Moderate to severe spinal stenosis with moderate subarticular stenosis bilaterally L4-5. Severe bilateral facet degeneration. No interval change Central left-sided disc protrusion L5-S1 unchanged. There is bilateral S1 nerve root impingement left greater than right Electronically Signed   By: Marlan Palau M.D.   On: 11/05/2020 16:01   DG CHEST PORT 1 VIEW  Result Date: 11/11/2020 CLINICAL DATA:  Shob Possible DVT EXAM: PORTABLE CHEST 1 VIEW COMPARISON:  11/09/2020 FINDINGS: Normal cardiac silhouette. No effusion, infiltrate pneumothorax. No pulmonary infarction identified. Central venous congestion increased from prior. IMPRESSION: Increased is venous congestion compared to prior. No pulmonary infarction identified.  Electronically Signed   By: Genevive Bi M.D.   On: 11/11/2020 11:56   ECHOCARDIOGRAM COMPLETE BUBBLE STUDY  Result Date: 11/09/2020    ECHOCARDIOGRAM REPORT   Patient Name:   BRYANN GENTZ Date of Exam: 11/09/2020 Medical Rec #:  [id number]      Height:       65.0 in Accession #:    [id number]     Weight:       290.1 lb Date of Birth:  April 07, 1966      BSA:          2.317 m Patient Age:    55 years       BP:           118//66 mmHg Patient Gender: F              HR:           75 bpm. Exam Location:  Inpatient Procedure: 2D Echo, Cardiac Doppler and Color Doppler Indications:    Pulmonary emboli  History:        Patient has prior history of Echocardiogram examinations, most                 recent 08/10/2020. Risk Factors:Diabetes.  Sonographer:    Shirlean Kelly Referring Phys: [id number] RONDELL A SMITH IMPRESSIONS  1. Left ventricular ejection fraction, by estimation, is 60 to 65%. The left ventricle has normal function. The left ventricle has no regional wall motion abnormalities. Left ventricular diastolic parameters were normal.  2. Right ventricular systolic function is normal. The right ventricular size is moderately enlarged.  3. Right atrial size was mildly dilated.  4. The mitral valve is normal in structure. Trivial mitral valve regurgitation.  5. The aortic valve is normal in structure. Aortic valve regurgitation is trivial.  6. Aortic dilatation noted. There is moderate dilatation of the ascending aorta, measuring 43 mm. FINDINGS  Left Ventricle: Left ventricular ejection fraction, by estimation, is 60 to 65%. The left ventricle has normal function. The left ventricle has no regional wall motion abnormalities. The left ventricular internal cavity size was normal in size. There is  no left ventricular hypertrophy. Left ventricular diastolic parameters were normal. Right Ventricle: The right ventricular size is moderately enlarged. Right vetricular wall thickness was not well visualized. Right  ventricular systolic function is normal. Left Atrium: Left atrial size was normal in size. Right Atrium: Right atrial size was mildly dilated. Pericardium: There is no evidence of pericardial effusion. Mitral Valve: The mitral valve is normal in structure. Trivial mitral valve regurgitation. Tricuspid Valve: The tricuspid valve is grossly normal. Tricuspid valve regurgitation is mild. Aortic Valve: The aortic valve is normal in structure. Aortic valve  regurgitation is trivial. Aortic valve mean gradient measures 12.0 mmHg. Aortic valve peak gradient measures 23.2 mmHg. Aortic valve area, by VTI measures 1.51 cm. Pulmonic Valve: The pulmonic valve was grossly normal. Pulmonic valve regurgitation is not visualized. Aorta: Aortic dilatation noted. There is moderate dilatation of the ascending aorta, measuring 43 mm. IAS/Shunts: The atrial septum is grossly normal.  LEFT VENTRICLE PLAX 2D LVIDd:         5.40 cm  Diastology LVIDs:         3.60 cm  LV e' medial:    8.92 cm/s LV PW:         0.80 cm  LV E/e' medial:  7.0 LV IVS:        0.80 cm  LV e' lateral:   11.30 cm/s LVOT diam:     2.20 cm  LV E/e' lateral: 5.5 LV SV:         70 LV SV Index:   30 LVOT Area:     3.80 cm  RIGHT VENTRICLE             IVC RV Basal diam:  3.60 cm     IVC diam: 1.90 cm RV S prime:     10.30 cm/s TAPSE (M-mode): 2.2 cm LEFT ATRIUM             Index       RIGHT ATRIUM           Index LA diam:        2.80 cm 1.21 cm/m  RA Area:     18.70 cm LA Vol (A2C):   51.3 ml 22.14 ml/m RA Volume:   55.30 ml  23.87 ml/m LA Vol (A4C):   46.8 ml 20.20 ml/m LA Biplane Vol: 49.3 ml 21.28 ml/m  AORTIC VALVE AV Area (Vmax):    1.96 cm AV Area (Vmean):   2.11 cm AV Area (VTI):     1.51 cm AV Vmax:           241.00 cm/s AV Vmean:          158.000 cm/s AV VTI:            0.464 m AV Peak Grad:      23.2 mmHg AV Mean Grad:      12.0 mmHg LVOT Vmax:         124.00 cm/s LVOT Vmean:        87.500 cm/s LVOT VTI:          0.184 m LVOT/AV VTI ratio: 0.40  AORTA  Ao Root diam: 3.50 cm Ao Asc diam:  4.30 cm MITRAL VALVE MV Area (PHT): 3.77 cm     SHUNTS MV Decel Time: 201 msec     Systemic VTI:  0.18 m MV E velocity: 62.20 cm/s   Systemic Diam: 2.20 cm MV A velocity: 109.00 cm/s MV E/A ratio:  0.57 Kristeen Miss MD Electronically signed by Kristeen Miss MD Signature Date/Time: 11/09/2020/4:25:18 PM    Final    VAS [place] LOWER EXTREMITY VENOUS (DVT)  Result Date: 11/10/2020  Lower Venous DVT Study Patient Name:  KENLIE SEKI  Date of Exam:   11/09/2020 Medical Rec #: [id number]       Accession #:    [id number] Date of Birth: 14-May-1965       Patient Gender: F Patient Age:   29Y Exam Location:  [facility] Procedure:      VAS [place] LOWER EXTREMITY VENOUS (DVT) Referring Phys: [id number]  RONDELL A SMITH --------------------------------------------------------------------------------  Indications: Pain.  Comparison Study: 11/12/18 prior Performing Technologist: [place] Ponder RVS  Examination Guidelines: A complete evaluation includes B-mode imaging, spectral Doppler, color Doppler, and power Doppler as needed of all accessible portions of each vessel. Bilateral testing is considered an integral part of a complete examination. Limited examinations for reoccurring indications may be performed as noted. The reflux portion of the exam is performed with the patient in reverse Trendelenburg.  +---------+---------------+---------+-----------+----------+--------------+ RIGHT    CompressibilityPhasicitySpontaneityPropertiesThrombus Aging +---------+---------------+---------+-----------+----------+--------------+ CFV      Full           Yes      Yes                                 +---------+---------------+---------+-----------+----------+--------------+ SFJ      Full                                                        +---------+---------------+---------+-----------+----------+--------------+ FV Prox  Full                                                         +---------+---------------+---------+-----------+----------+--------------+ FV Mid   Full                                                        +---------+---------------+---------+-----------+----------+--------------+ FV DistalFull                                                        +---------+---------------+---------+-----------+----------+--------------+ PFV      Full                                                        +---------+---------------+---------+-----------+----------+--------------+ POP      Full           Yes      Yes                                 +---------+---------------+---------+-----------+----------+--------------+ PTV      Full                                                        +---------+---------------+---------+-----------+----------+--------------+ PERO     Full                                                        +---------+---------------+---------+-----------+----------+--------------+   +---------+---------------+---------+-----------+----------+-------------------+  LEFT     CompressibilityPhasicitySpontaneityPropertiesThrombus Aging      +---------+---------------+---------+-----------+----------+-------------------+ CFV      Full           Yes      Yes                                      +---------+---------------+---------+-----------+----------+-------------------+ SFJ      Full                                                             +---------+---------------+---------+-----------+----------+-------------------+ FV Prox  Full                                                             +---------+---------------+---------+-----------+----------+-------------------+ FV Mid   Full                                                             +---------+---------------+---------+-----------+----------+-------------------+ FV DistalFull                                                              +---------+---------------+---------+-----------+----------+-------------------+ PFV      Full                                                             +---------+---------------+---------+-----------+----------+-------------------+ POP      Partial        Yes      Yes                  Age Indeterminate   +---------+---------------+---------+-----------+----------+-------------------+ PTV      Full                                                             +---------+---------------+---------+-----------+----------+-------------------+ PERO                                                  Not well visualized +---------+---------------+---------+-----------+----------+-------------------+     Summary: RIGHT: - There is no evidence of deep vein thrombosis in the lower extremity.  - No cystic structure found in the popliteal fossa.  LEFT: - Findings consistent with age indeterminate deep vein thrombosis involving the left popliteal vein. - No cystic structure found in the popliteal fossa.  *See table(s) above for measurements and observations. Electronically signed by Fabienne Brunsharles Fields MD on 11/10/2020 at 9:21:02 AM.    Final     Discharge Instructions     Call MD / Call 911   Complete by: As directed    If you experience chest pain or shortness of breath, CALL 911 and be transported to the hospital emergency room.  If you develope a fever above 101 F, pus (white drainage) or increased drainage or redness at the wound, or calf pain, call your surgeon's office.   Constipation Prevention   Complete by: As directed    Drink plenty of fluids.  Prune juice may be helpful.  You may use a stool softener, such as Colace (over the counter) 100 mg twice a day.  Use MiraLax (over the counter) for constipation as needed.   Diet - low sodium heart healthy   Complete by: As directed    Discharge instructions   Complete by: As directed    Follow discharge instructions as  provided by Dr.Brooks   Incentive spirometry RT   Complete by: As directed    Increase activity slowly as tolerated   Complete by: As directed    Post-operative opioid taper instructions:   Complete by: As directed    POST-OPERATIVE OPIOID TAPER INSTRUCTIONS: It is important to wean off of your opioid medication as soon as possible. If you do not need pain medication after your surgery it is ok to stop day one. Opioids include: Codeine, Hydrocodone(Norco, Vicodin), Oxycodone(Percocet, oxycontin) and hydromorphone amongst others.  Long term and even short term use of opiods can cause: Increased pain response Dependence Constipation Depression Respiratory depression And more.  Withdrawal symptoms can include Flu like symptoms Nausea, vomiting And more Techniques to manage these symptoms Hydrate well Eat regular healthy meals Stay active Use relaxation techniques(deep breathing, meditating, yoga) Do Not substitute Alcohol to help with tapering If you have been on opioids for less than two weeks and do not have pain than it is ok to stop all together.  Plan to wean off of opioids This plan should start within one week post op of your joint replacement. Maintain the same interval or time between taking each dose and first decrease the dose.  Cut the total daily intake of opioids by one tablet each day Next start to increase the time between doses. The last dose that should be eliminated is the evening dose.           Follow-up Information     Venita LickBrooks, Dahari, MD Follow up in 2 week(s).   Specialty: Orthopedic Surgery Why: If symptoms worsen, For suture removal, For wound re-check Contact information: [address] [place] [address] [phone]         Dois Davenportichter, Karen L, MD. Schedule an appointment as soon as possible for a visit in 1 week(s).   Specialty: Family Medicine Contact information: [address] [place]  [id number] [phone]         Margarite GougeLlc, [facility] Follow up.   Why: rolling walker and home 02 arranged- equipment to be delivered to room prior to discharge including transport 02 tank Contact information: [address] [place] [address] [phone]         Outpt therapy. Call.   Why: follow up with Ortho outpt  therapy regarding continuing your outpt therapy needs (may have to call Dr. Shon Baton office to get new referral for outpt PT/OT needs-depending on where you decide to go for outpt therapy)                Discharge Plan:  discharge to home  Disposition: stable    Signed: Rhodia Albright for [facility] PA-C [facility] [phone] 11/13/2020, 11:23 AM

## 2020-11-15 ENCOUNTER — Other Ambulatory Visit: Payer: 59

## 2020-12-05 ENCOUNTER — Ambulatory Visit: Admit: 2020-12-05 | Payer: 59 | Admitting: Orthopedic Surgery

## 2020-12-05 SURGERY — LUMBAR LAMINECTOMY/DECOMPRESSION MICRODISCECTOMY
Anesthesia: General | Laterality: Left

## 2020-12-06 ENCOUNTER — Ambulatory Visit: Payer: Commercial Managed Care - PPO | Admitting: Neurology

## 2020-12-12 ENCOUNTER — Other Ambulatory Visit: Payer: Self-pay

## 2020-12-12 ENCOUNTER — Ambulatory Visit
Admission: RE | Admit: 2020-12-12 | Discharge: 2020-12-12 | Disposition: A | Discharge status: Home / Self Care | Payer: Commercial Managed Care - PPO | Source: Ambulatory Visit | Attending: Gastroenterology | Admitting: Gastroenterology

## 2020-12-12 DIAGNOSIS — K7460 Unspecified cirrhosis of liver: Secondary | ICD-10-CM

## 2020-12-16 NOTE — Progress Notes (Deleted)
Synopsis: Referred in August 2022 for PE by Dois Davenport, MD  Subjective:   PATIENT ID: Erika Woods GENDER: female DOB: [birth year], MRN: [id number]  No chief complaint on file.   This is a 55 year old female, past medical history of diabetes, kidney stones, sleep apnea on CPAP obesity, pulmonary embolism in 2019, pulmonary embolism in 2020.Patient referred for evaluation of PE.  Recent hospitalization in July 2022 after lumbar surgery.  Patient was started on Xarelto for PE during hospitalization.  This CTA of the chest was completed on 11/09/2020 which revealed bilateral pulmonary emboli, large embolus within the right distal pulmonary artery.  Enlarged right ventricle concerning for submassive PE.    Past Medical History:  Diagnosis Date   Acute pulmonary embolism ([name]) 11/11/2018   Acute pulmonary embolism with acute cor pulmonale ([name]) 09/16/2017   Acute superficial venous thrombosis of left lower extremity    Aortic insufficiency    Echocardiogram 08/2019: prob bicuspid AoV, mild to mod AI, mild AS (mean 12 mmHg), EF 55-60, no RWMA, Gr 1 DD, GLS -22.2%, normal RVSF, Ao Root 39 mm   Coronary CTA    Coronary CTA 08/2019: Calcium score 0, no evidence of CAD, ascending aorta 4 cm   Diabetes mellitus without complication ([name])    DM (diabetes mellitus) ([name]) 08/18/2011   Impaired fasting glucose    Mood disorder ([name]) 08/18/2011   Obesity, Class III, BMI 40-49.9 (morbid obesity) ([name]) 09/16/2017   Obstructive sleep apnea on CPAP    Osteoarthritis of knee    Personal history of pulmonary embolism    PTSD (post-traumatic stress disorder) 08/18/2011   Pulmonary emboli ([name]) 09/15/2017   Renal disorder    kideny stones   Restless leg syndrome    Severe recurrent major depression without psychotic features ([name]) 02/17/2018   Sleep apnea 08/18/2011   Thoracic aortic aneurysm [facility])    Thoracic aortic aneurysm without rupture ([name])      Family History  Adopted: Yes     Past Surgical History:   Procedure Laterality Date   arm surgery Bilateral Skin removal   DILATATION & CURETTAGE/HYSTEROSCOPY WITH MYOSURE N/A 06/11/2018   Procedure: DILATATION & CURETTAGE/HYSTEROSCOPY WITH MYOSURE RESECTION OF ENDOMETRIAL THICKENING;  Surgeon: Richardean Chimera, MD;  Location: [facility];  Service: Gynecology;  Laterality: N/A;  BMI 56   DILATION AND EVACUATION     ESOPHAGOGASTRODUODENOSCOPY (EGD) WITH PROPOFOL N/A 07/15/2018   Procedure: ESOPHAGOGASTRODUODENOSCOPY (EGD) WITH PROPOFOL;  Surgeon: Willis Modena, MD;  Location: [facility];  Service: Endoscopy;  Laterality: N/A;   ESOPHAGOGASTRODUODENOSCOPY (EGD) WITH PROPOFOL N/A 04/27/2019   Procedure: ESOPHAGOGASTRODUODENOSCOPY (EGD) WITH PROPOFOL;  Surgeon: Willis Modena, MD;  [facility];  Service: Endoscopy;  Laterality: N/A;   IR ABLATE LIVER CRYOABLATION  10/28/2019   IR ABLATE LIVER CRYOABLATION  05/07/2020   IR RADIOLOGIST EVAL & MGMT  10/14/2019   IR RADIOLOGIST EVAL & MGMT  04/03/2020   KIDNEY STONE SURGERY     KNEE SURGERY Right    LUMBAR LAMINECTOMY/DECOMPRESSION MICRODISCECTOMY N/A 11/08/2020   Procedure: LUMBAR FIVE AND SACRAL ONE LAMINECTOMY/DECOMPRESSION MICRODISCECTOMY 1 LEVEL;  Surgeon: Venita Lick, MD;  Location: [facility];  Service: Orthopedics;  Laterality: N/A;   TONSILLECTOMY      Social History   Socioeconomic History   Marital status: Married    Spouse name: Not on file   Number of children: 2   Years of education: Not on file   Highest education level: Not on file  Occupational History  Occupation: Administrator  Tobacco Use   Smoking status: Never   Smokeless tobacco: Never  Vaping Use   Vaping Use: Never used  Substance and Sexual Activity   Alcohol use: Yes    Comment: "wine once or twice a month"   Drug use: No   Sexual activity: Not on file  Other Topics Concern   Not on file  Social History Narrative   Not on file   Social Determinants of Health   Financial Resource Strain: Not on file   Food Insecurity: Not on file  Transportation Needs: Not on file  Physical Activity: Not on file  Stress: Not on file  Social Connections: Not on file  Intimate Partner Violence: Not on file     Allergies  Allergen Reactions   Sulfa Antibiotics Hives     Outpatient Medications Prior to Visit  Medication Sig Dispense Refill   BREZTRI AEROSPHERE 160-9-4.8 MCG/ACT AERO Inhale 2 puffs into the lungs 2 (two) times daily as needed (wheezing/shortness of breath).     dextromethorphan (DELSYM) 30 MG/5ML liquid Take 15 mg by mouth as needed for cough.     doxepin (SINEQUAN) 10 MG capsule Take 50 mg by mouth at bedtime.     furosemide (LASIX) 20 MG tablet Take 1 tablet (20 mg total) by mouth daily as needed for edema (weight gain of >3lbs in 1 day or >5lbs in 2 days.). 30 tablet 0   gabapentin (NEURONTIN) 300 MG capsule Take 300 mg by mouth 3 (three) times daily.     guaiFENesin-dextromethorphan (ROBITUSSIN DM) 100-10 MG/5ML syrup Take 5 mLs by mouth every 4 (four) hours as needed for cough.     lamoTRIgine (LAMICTAL) 150 MG tablet Take 150 mg by mouth daily after breakfast.      metFORMIN (GLUCOPHAGE) 500 MG tablet Take 500 mg by mouth 2 (two) times daily with a meal.      ondansetron (ZOFRAN) 4 MG tablet Take 1 tablet (4 mg total) by mouth every 8 (eight) hours as needed for nausea or vomiting. 30 tablet 0   oxymetazoline (AFRIN) 0.05 % nasal spray Place 1 spray into both nostrils 2 (two) times daily as needed for congestion.     polyethylene glycol (MIRALAX / GLYCOLAX) 17 g packet Take 17 g by mouth daily as needed for mild constipation. 14 each 0   pseudoephedrine (SUDAFED) 30 MG tablet Take 30 mg by mouth every 4 (four) hours as needed for congestion.     rivaroxaban (XARELTO) 20 MG TABS tablet Take 1 tablet (20 mg total) by mouth daily with supper. 30 tablet 0   rOPINIRole (REQUIP) 4 MG tablet Take 8 mg by mouth at bedtime.     TRINTELLIX 20 MG TABS tablet Take 20 mg by mouth daily.      No facility-administered medications prior to visit.    ROS   Objective:  Physical Exam   There were no vitals filed for this visit.   on *** LPM *** RA BMI Readings from Last 3 Encounters:  11/05/20 48.28 kg/m  04/18/20 48.36 kg/m  08/09/19 54.38 kg/m   Wt Readings from Last 3 Encounters:  11/05/20 290 lb 2 oz (131.6 kg)  04/18/20 290 lb 9.6 oz (131.8 kg)  08/09/19 (!) 326 lb 12.8 oz (148.2 kg)     CBC    Component Value Date/Time   WBC 9.3 11/11/2020 0547   RBC 4.01 11/11/2020 0547   HGB 11.4 (L) 11/11/2020 0547   HGB 12.5 04/18/2020  1326   HCT 37.4 11/11/2020 0547   HCT 37.7 04/18/2020 1326   PLT 219 11/11/2020 0547   PLT 265 04/18/2020 1326   MCV 93.3 11/11/2020 0547   MCV 85 04/18/2020 1326   MCH 28.4 11/11/2020 0547   MCHC 30.5 11/11/2020 0547   RDW 15.0 11/11/2020 0547   RDW 13.4 04/18/2020 1326   LYMPHSABS 2.1 11/05/2020 1314   MONOABS 0.5 11/05/2020 1314   EOSABS 0.0 11/05/2020 1314   BASOSABS 0.0 11/05/2020 1314     Chest Imaging: CTA chest 11/09/2020: Revealed bilateral pulmonary emboli with enlarged right ventricle concerning for submassive PE. The patient's images have been independently reviewed by me.    Pulmonary Functions Testing Results: No flowsheet data found.  FeNO:   Pathology:   Echocardiogram:   Heart Catheterization:     Assessment & Plan:     ICD-10-CM   1. Pulmonary embolism without acute cor pulmonale, unspecified chronicity, unspecified pulmonary embolism type ([name])  I26.99     2. History of pulmonary embolism  Z86.711     3. On continuous oral anticoagulation  Z79.01       Discussion: ***   Current Outpatient Medications:    BREZTRI AEROSPHERE 160-9-4.8 MCG/ACT AERO, Inhale 2 puffs into the lungs 2 (two) times daily as needed (wheezing/shortness of breath)., Disp: , Rfl:    dextromethorphan (DELSYM) 30 MG/5ML liquid, Take 15 mg by mouth as needed for cough., Disp: , Rfl:    doxepin (SINEQUAN) 10 MG  capsule, Take 50 mg by mouth at bedtime., Disp: , Rfl:    furosemide (LASIX) 20 MG tablet, Take 1 tablet (20 mg total) by mouth daily as needed for edema (weight gain of >3lbs in 1 day or >5lbs in 2 days.)., Disp: 30 tablet, Rfl: 0   gabapentin (NEURONTIN) 300 MG capsule, Take 300 mg by mouth 3 (three) times daily., Disp: , Rfl:    guaiFENesin-dextromethorphan (ROBITUSSIN DM) 100-10 MG/5ML syrup, Take 5 mLs by mouth every 4 (four) hours as needed for cough., Disp: , Rfl:    lamoTRIgine (LAMICTAL) 150 MG tablet, Take 150 mg by mouth daily after breakfast. , Disp: , Rfl:    metFORMIN (GLUCOPHAGE) 500 MG tablet, Take 500 mg by mouth 2 (two) times daily with a meal. , Disp: , Rfl:    ondansetron (ZOFRAN) 4 MG tablet, Take 1 tablet (4 mg total) by mouth every 8 (eight) hours as needed for nausea or vomiting., Disp: 30 tablet, Rfl: 0   oxymetazoline (AFRIN) 0.05 % nasal spray, Place 1 spray into both nostrils 2 (two) times daily as needed for congestion., Disp: , Rfl:    polyethylene glycol (MIRALAX / GLYCOLAX) 17 g packet, Take 17 g by mouth daily as needed for mild constipation., Disp: 14 each, Rfl: 0   pseudoephedrine (SUDAFED) 30 MG tablet, Take 30 mg by mouth every 4 (four) hours as needed for congestion., Disp: , Rfl:    rivaroxaban (XARELTO) 20 MG TABS tablet, Take 1 tablet (20 mg total) by mouth daily with supper., Disp: 30 tablet, Rfl: 0   rOPINIRole (REQUIP) 4 MG tablet, Take 8 mg by mouth at bedtime., Disp: , Rfl:    TRINTELLIX 20 MG TABS tablet, Take 20 mg by mouth daily., Disp: , Rfl:   I spent *** minutes dedicated to the care of this patient on the date of this encounter to include pre-visit review of records, face-to-face time with the patient discussing conditions above, post visit ordering of testing, clinical documentation with  the electronic health record, making appropriate referrals as documented, and communicating necessary findings to members of the patients care team.   Josephine Igo, DO [facility] 12/16/2020 11:00 AM

## 2020-12-17 ENCOUNTER — Institutional Professional Consult (permissible substitution): Payer: Commercial Managed Care - PPO | Admitting: Pulmonary Disease

## 2020-12-17 DIAGNOSIS — Z86711 Personal history of pulmonary embolism: Secondary | ICD-10-CM

## 2020-12-17 DIAGNOSIS — I2699 Other pulmonary embolism without acute cor pulmonale: Secondary | ICD-10-CM

## 2020-12-17 DIAGNOSIS — Z7901 Long term (current) use of anticoagulants: Secondary | ICD-10-CM

## 2020-12-19 ENCOUNTER — Other Ambulatory Visit: Payer: Self-pay | Admitting: Gastroenterology

## 2020-12-19 DIAGNOSIS — R935 Abnormal findings on diagnostic imaging of other abdominal regions, including retroperitoneum: Secondary | ICD-10-CM

## 2021-01-04 ENCOUNTER — Ambulatory Visit
Admission: RE | Admit: 2021-01-04 | Discharge: 2021-01-04 | Disposition: A | Discharge status: Home / Self Care | Payer: Commercial Managed Care - PPO | Source: Ambulatory Visit | Attending: Obstetrics and Gynecology | Admitting: Obstetrics and Gynecology

## 2021-01-04 ENCOUNTER — Other Ambulatory Visit: Payer: Self-pay

## 2021-01-04 DIAGNOSIS — Z9189 Other specified personal risk factors, not elsewhere classified: Secondary | ICD-10-CM

## 2021-01-04 MED ORDER — GADOBUTROL 1 MMOL/ML IV SOLN
10.0000 mL | Freq: Once | INTRAVENOUS | Status: AC | PRN
  Administered 2021-01-04: 10 mL via INTRAVENOUS

## 2021-01-05 ENCOUNTER — Other Ambulatory Visit: Payer: Commercial Managed Care - PPO

## 2021-01-05 ENCOUNTER — Ambulatory Visit
Admission: RE | Admit: 2021-01-05 | Discharge: 2021-01-05 | Disposition: A | Discharge status: Home / Self Care | Payer: Commercial Managed Care - PPO | Source: Ambulatory Visit | Attending: Gastroenterology | Admitting: Gastroenterology

## 2021-01-05 DIAGNOSIS — R935 Abnormal findings on diagnostic imaging of other abdominal regions, including retroperitoneum: Secondary | ICD-10-CM

## 2021-01-05 MED ORDER — GADOBENATE DIMEGLUMINE 529 MG/ML IV SOLN
20.0000 mL | Freq: Once | INTRAVENOUS | Status: AC | PRN
  Administered 2021-01-05: 20 mL via INTRAVENOUS

## 2021-01-29 ENCOUNTER — Encounter (HOSPITAL_BASED_OUTPATIENT_CLINIC_OR_DEPARTMENT_OTHER): Payer: Self-pay | Admitting: Emergency Medicine

## 2021-01-29 ENCOUNTER — Emergency Department (HOSPITAL_BASED_OUTPATIENT_CLINIC_OR_DEPARTMENT_OTHER): Payer: Commercial Managed Care - PPO

## 2021-01-29 ENCOUNTER — Other Ambulatory Visit: Payer: Self-pay

## 2021-01-29 ENCOUNTER — Emergency Department (HOSPITAL_BASED_OUTPATIENT_CLINIC_OR_DEPARTMENT_OTHER): Payer: Commercial Managed Care - PPO | Admitting: Radiology

## 2021-01-29 ENCOUNTER — Emergency Department (HOSPITAL_BASED_OUTPATIENT_CLINIC_OR_DEPARTMENT_OTHER)
Admission: EM | Admit: 2021-01-29 | Discharge: 2021-01-29 | Disposition: A | Discharge status: Home / Self Care | Payer: Commercial Managed Care - PPO | Attending: Emergency Medicine | Admitting: Emergency Medicine

## 2021-01-29 DIAGNOSIS — R059 Cough, unspecified: Secondary | ICD-10-CM | POA: Diagnosis not present

## 2021-01-29 DIAGNOSIS — Z7984 Long term (current) use of oral hypoglycemic drugs: Secondary | ICD-10-CM | POA: Insufficient documentation

## 2021-01-29 DIAGNOSIS — E119 Type 2 diabetes mellitus without complications: Secondary | ICD-10-CM | POA: Insufficient documentation

## 2021-01-29 DIAGNOSIS — Z20822 Contact with and (suspected) exposure to covid-19: Secondary | ICD-10-CM | POA: Insufficient documentation

## 2021-01-29 DIAGNOSIS — Z8616 Personal history of COVID-19: Secondary | ICD-10-CM | POA: Diagnosis not present

## 2021-01-29 DIAGNOSIS — Z7901 Long term (current) use of anticoagulants: Secondary | ICD-10-CM | POA: Insufficient documentation

## 2021-01-29 DIAGNOSIS — R0602 Shortness of breath: Secondary | ICD-10-CM | POA: Diagnosis present

## 2021-01-29 LAB — COMPREHENSIVE METABOLIC PANEL
ALT: 18 U/L (ref 0–44)
AST: 18 U/L (ref 15–41)
Albumin: 4.1 g/dL (ref 3.5–5.0)
Alkaline Phosphatase: 87 U/L (ref 38–126)
Anion gap: 9 (ref 5–15)
BUN: 24 mg/dL — ABNORMAL HIGH (ref 6–20)
CO2: 25 mmol/L (ref 22–32)
Calcium: 9.5 mg/dL (ref 8.9–10.3)
Chloride: 106 mmol/L (ref 98–111)
Creatinine, Ser: 1.19 mg/dL — ABNORMAL HIGH (ref 0.44–1.00)
GFR, Estimated: 54 mL/min — ABNORMAL LOW (ref >60)
Glucose, Bld: 101 mg/dL — ABNORMAL HIGH (ref 70–99)
Potassium: 4.7 mmol/L (ref 3.5–5.1)
Sodium: 140 mmol/L (ref 135–145)
Total Bilirubin: 0.4 mg/dL (ref 0.3–1.2)
Total Protein: 6.7 g/dL (ref 6.5–8.1)

## 2021-01-29 LAB — CBC WITH DIFFERENTIAL/PLATELET
Abs Immature Granulocytes: 0.03 10*3/uL (ref 0.00–0.07)
Basophils Absolute: 0.1 10*3/uL (ref 0.0–0.1)
Basophils Relative: 1 %
Eosinophils Absolute: 0.5 10*3/uL (ref 0.0–0.5)
Eosinophils Relative: 7 %
HCT: 38.4 % (ref 36.0–46.0)
Hemoglobin: 12 g/dL (ref 12.0–15.0)
Immature Granulocytes: 0 %
Lymphocytes Relative: 23 %
Lymphs Abs: 1.7 10*3/uL (ref 0.7–4.0)
MCH: 28.1 pg (ref 26.0–34.0)
MCHC: 31.3 g/dL (ref 30.0–36.0)
MCV: 89.9 fL (ref 80.0–100.0)
Monocytes Absolute: 0.3 10*3/uL (ref 0.1–1.0)
Monocytes Relative: 5 %
Neutro Abs: 4.6 10*3/uL (ref 1.7–7.7)
Neutrophils Relative %: 64 %
Platelets: 263 10*3/uL (ref 150–400)
RBC: 4.27 MIL/uL (ref 3.87–5.11)
RDW: 14.6 % (ref 11.5–15.5)
WBC: 7.2 10*3/uL (ref 4.0–10.5)
nRBC: 0 % (ref 0.0–0.2)

## 2021-01-29 LAB — RESP PANEL BY RT-PCR (FLU A&B, COVID) ARPGX2
Influenza A by PCR: NEGATIVE
Influenza B by PCR: NEGATIVE
SARS Coronavirus 2 by RT PCR: NEGATIVE

## 2021-01-29 LAB — TROPONIN I (HIGH SENSITIVITY): Troponin I (High Sensitivity): 3 ng/L (ref <18)

## 2021-01-29 LAB — BRAIN NATRIURETIC PEPTIDE: B Natriuretic Peptide: 62.3 pg/mL (ref 0.0–100.0)

## 2021-01-29 MED ORDER — IOHEXOL 350 MG/ML SOLN
75.0000 mL | Freq: Once | INTRAVENOUS | Status: AC | PRN
  Administered 2021-01-29: 75 mL via INTRAVENOUS

## 2021-01-29 NOTE — ED Notes (Signed)
Patient transported to CT 

## 2021-01-29 NOTE — ED Provider Notes (Signed)
[facility] EMERGENCY DEPT Provider Note   CSN: [id number] Arrival date & time: 01/29/21  1641     History Chief Complaint  Patient presents with   Shortness of Breath    Erika Woods is a 55 y.o. female with a past medical history significant for diabetes, history of PE on chronic Xarelto, history of thoracic aortic aneurysm, and PTSD who presents to the ED due to severe shortness of breath with exertion x3 days. No associated chest pain. Patient states symptoms started out with extreme fatigue then progressed to dyspnea with exertion.  Denies lower extremity edema.  Patient states she checked her oxygen saturation at home which was 91% with ambulation.  Patient is concerned about a possible new PE.  Patient had recent lumbar surgery in June 2020 which is when she developed a PE status post surgery.  No history of heart failure.  Admits to some intermittent nonproductive cough.  No fever or chills.  No sick contacts or known COVID exposures.  Patient recently had COVID in June.  No treatment prior to arrival.  History obtained from patient and past medical records. No interpreter used during encounter.       Past Medical History:  Diagnosis Date   Acute pulmonary embolism ([name]) 11/11/2018   Acute pulmonary embolism with acute cor pulmonale ([name]) 09/16/2017   Acute superficial venous thrombosis of left lower extremity    Aortic insufficiency    Echocardiogram 08/2019: prob bicuspid AoV, mild to mod AI, mild AS (mean 12 mmHg), EF 55-60, no RWMA, Gr 1 DD, GLS -22.2%, normal RVSF, Ao Root 39 mm   Coronary CTA    Coronary CTA 08/2019: Calcium score 0, no evidence of CAD, ascending aorta 4 cm   Diabetes mellitus without complication ([name])    DM (diabetes mellitus) ([name]) 08/18/2011   Impaired fasting glucose    Mood disorder ([name]) 08/18/2011   Obesity, Class III, BMI 40-49.9 (morbid obesity) ([name]) 09/16/2017   Obstructive sleep apnea on CPAP    Osteoarthritis of knee    Personal  history of pulmonary embolism    PTSD (post-traumatic stress disorder) 08/18/2011   Pulmonary emboli ([name]) 09/15/2017   Renal disorder    kideny stones   Restless leg syndrome    Severe recurrent major depression without psychotic features ([name]) 02/17/2018   Sleep apnea 08/18/2011   Thoracic aortic aneurysm [facility])    Thoracic aortic aneurysm without rupture [facility])     Patient Active Problem List   Diagnosis Date Noted   Lumbar disc herniation 11/08/2020   Radiculopathy 11/05/2020   COVID-19 virus detected 06/07/2019   Acute pulmonary embolism ([name]) 11/11/2018   Severe recurrent major depression without psychotic features ([name]) 02/17/2018   Obesity, Class III, BMI 40-49.9 (morbid obesity) ([name]) 09/16/2017   Acute pulmonary embolism with acute cor pulmonale ([name]) 09/16/2017   Acute superficial venous thrombosis of left lower extremity    Thoracic aortic aneurysm without rupture ([name])    Pulmonary emboli ([name]) 09/15/2017   PTSD (post-traumatic stress disorder) 08/18/2011   DM (diabetes mellitus) ([name]) 08/18/2011   Sleep apnea 08/18/2011   Mood disorder ([name]) 08/18/2011    Past Surgical History:  Procedure Laterality Date   arm surgery Bilateral Skin removal   DILATATION & CURETTAGE/HYSTEROSCOPY WITH MYOSURE N/A 06/11/2018   Procedure: DILATATION & CURETTAGE/HYSTEROSCOPY WITH MYOSURE RESECTION OF ENDOMETRIAL THICKENING;  Surgeon: Richardean Chimera, MD;  Location: [facility];  Service: Gynecology;  Laterality: N/A;  BMI 56   DILATION AND EVACUATION  ESOPHAGOGASTRODUODENOSCOPY (EGD) WITH PROPOFOL N/A 07/15/2018   Procedure: ESOPHAGOGASTRODUODENOSCOPY (EGD) WITH PROPOFOL;  Surgeon: Willis Modena, MD;  [facility];  Service: Endoscopy;  Laterality: N/A;   ESOPHAGOGASTRODUODENOSCOPY (EGD) WITH PROPOFOL N/A 04/27/2019   Procedure: ESOPHAGOGASTRODUODENOSCOPY (EGD) WITH PROPOFOL;  Surgeon: Willis Modena, MD;  [facility];  Service: Endoscopy;  Laterality: N/A;   IR ABLATE LIVER  CRYOABLATION  10/28/2019   IR ABLATE LIVER CRYOABLATION  05/07/2020   IR RADIOLOGIST EVAL & MGMT  10/14/2019   IR RADIOLOGIST EVAL & MGMT  04/03/2020   KIDNEY STONE SURGERY     KNEE SURGERY Right    LUMBAR LAMINECTOMY/DECOMPRESSION MICRODISCECTOMY N/A 11/08/2020   Procedure: LUMBAR FIVE AND SACRAL ONE LAMINECTOMY/DECOMPRESSION MICRODISCECTOMY 1 LEVEL;  Surgeon: Venita Lick, MD;  Location: [facility];  Service: Orthopedics;  Laterality: N/A;   TONSILLECTOMY       OB History   No obstetric history on file.     Family History  Adopted: Yes    Social History   Tobacco Use   Smoking status: Never   Smokeless tobacco: Never  Vaping Use   Vaping Use: Never used  Substance Use Topics   Alcohol use: Yes    Comment: "wine once or twice a month"   Drug use: No    Home Medications Prior to Admission medications   Medication Sig Start Date End Date Taking? Authorizing Provider  BREZTRI AEROSPHERE 160-9-4.8 MCG/ACT AERO Inhale 2 puffs into the lungs 2 (two) times daily as needed (wheezing/shortness of breath). 10/15/20   [provider]  dextromethorphan (DELSYM) 30 MG/5ML liquid Take 15 mg by mouth as needed for cough.    [provider]  doxepin (SINEQUAN) 10 MG capsule Take 50 mg by mouth at bedtime. 03/12/20   [provider]  furosemide (LASIX) 20 MG tablet Take 1 tablet (20 mg total) by mouth daily as needed for edema (weight gain of >3lbs in 1 day or >5lbs in 2 days.). 11/12/20 11/12/21  Rolly Salter, MD  gabapentin (NEURONTIN) 300 MG capsule Take 300 mg by mouth 3 (three) times daily. 04/01/20   [provider]  guaiFENesin-dextromethorphan (ROBITUSSIN DM) 100-10 MG/5ML syrup Take 5 mLs by mouth every 4 (four) hours as needed for cough.    [provider]  lamoTRIgine (LAMICTAL) 150 MG tablet Take 150 mg by mouth daily after breakfast.  06/30/18   [provider]  metFORMIN (GLUCOPHAGE) 500 MG tablet Take 500 mg by mouth 2 (two) times  daily with a meal.  10/28/13   [provider]  ondansetron (ZOFRAN) 4 MG tablet Take 1 tablet (4 mg total) by mouth every 8 (eight) hours as needed for nausea or vomiting. 11/12/20   Darrick Grinder, PA  oxymetazoline (AFRIN) 0.05 % nasal spray Place 1 spray into both nostrils 2 (two) times daily as needed for congestion.    [provider]  polyethylene glycol (MIRALAX / GLYCOLAX) 17 g packet Take 17 g by mouth daily as needed for mild constipation. 11/12/20   Rolly Salter, MD  pseudoephedrine (SUDAFED) 30 MG tablet Take 30 mg by mouth every 4 (four) hours as needed for congestion.    [provider]  rivaroxaban (XARELTO) 20 MG TABS tablet Take 1 tablet (20 mg total) by mouth daily with supper. 11/12/20   Rolly Salter, MD  rOPINIRole (REQUIP) 4 MG tablet Take 8 mg by mouth at bedtime. 11/03/20   [provider]  TRINTELLIX 20 MG TABS tablet Take 20  mg by mouth daily. 05/13/19   [provider]    Allergies    Sulfa antibiotics  Review of Systems   Review of Systems  Constitutional:  Negative for chills and fever.  Respiratory:  Positive for cough and shortness of breath.   Cardiovascular:  Negative for chest pain and leg swelling.  All other systems reviewed and are negative.  Physical Exam Updated Vital Signs BP 111/62 (BP Location: Left Arm)   Pulse 63   Temp 97.9 F (36.6 C) (Oral)   Resp 20   Ht 5\' 5"  (1.651 m) Comment: Simultaneous filing. User may not have seen previous data.  Wt 131.5 kg Comment: Simultaneous filing. User may not have seen previous data.  LMP 01/29/2013   SpO2 100%   BMI 48.26 kg/m   Physical Exam Vitals and nursing note reviewed.  Constitutional:      General: She is not in acute distress.    Appearance: She is not ill-appearing.  HENT:     Head: Normocephalic.  Eyes:     Pupils: Pupils are equal, round, and reactive to light.  Cardiovascular:     Rate and Rhythm: Normal rate and regular rhythm.      Pulses: Normal pulses.     Heart sounds: Normal heart sounds. No murmur heard.   No friction rub. No gallop.  Pulmonary:     Effort: Pulmonary effort is normal.     Breath sounds: Normal breath sounds.     Comments: 1L [place]  Abdominal:     General: Abdomen is flat. There is no distension.     Palpations: Abdomen is soft.     Tenderness: There is no abdominal tenderness. There is no guarding or rebound.  Musculoskeletal:        General: Normal range of motion.     Cervical back: Neck supple.     Comments: No pitting edema bilaterally  Skin:    General: Skin is warm and dry.  Neurological:     General: No focal deficit present.     Mental Status: She is alert.  Psychiatric:        Mood and Affect: Mood normal.        Behavior: Behavior normal.    ED Results / Procedures / Treatments   Labs (all labs ordered are listed, but only abnormal results are displayed) Labs Reviewed  COMPREHENSIVE METABOLIC PANEL - Abnormal; Notable for the following components:      Result Value   Glucose, Bld 101 (*)    BUN 24 (*)    Creatinine, Ser 1.19 (*)    GFR, Estimated 54 (*)    All other components within normal limits  RESP PANEL BY RT-PCR (FLU A&B, COVID) [id number]  CBC WITH DIFFERENTIAL/PLATELET  BRAIN NATRIURETIC PEPTIDE  TROPONIN I (HIGH SENSITIVITY)    EKG None  Radiology DG Chest 2 View  Result Date: 01/29/2021 CLINICAL DATA:  Shortness of breath and fatigue. EXAM: CHEST - 2 VIEW COMPARISON:  Radiograph and chest CTA 12/16/2020 at [facility]. FINDINGS: The cardiomediastinal contours are stable. Occasional areas of atelectasis, left greater than right. Pulmonary vasculature is normal. No consolidation, pleural effusion, or pneumothorax. No acute osseous abnormalities are seen. IMPRESSION: Occasional areas of atelectasis, left greater than right. Electronically Signed   By: Narda Rutherford M.D.   On: 01/29/2021 19:02   CT Angio Chest PE W and/or Wo Contrast  Result Date:  01/29/2021 CLINICAL DATA:  Shortness of breath for 3 days, history of prior  COVID-19 infection as well as pulmonary embolus EXAM: CT ANGIOGRAPHY CHEST WITH CONTRAST TECHNIQUE: Multidetector CT imaging of the chest was performed using the standard protocol during bolus administration of intravenous contrast. Multiplanar CT image reconstructions and MIPs were obtained to evaluate the vascular anatomy. CONTRAST:  42mL OMNIPAQUE IOHEXOL 350 MG/ML SOLN COMPARISON:  12/16/2020 FINDINGS: Cardiovascular: Thoracic aorta again demonstrates dilatation to 4.2 cm in the ascending aorta. Normal tapering in the thoracic aortic arch is noted. Descending thoracic aorta is within normal limits. The pulmonary artery is well visualized with a normal branching pattern bilaterally. Previously seen residual right lower lobe thrombus has resolved in the interval from the prior exam. No new pulmonary emboli are noted. Mediastinum/Nodes: Thoracic inlet is within normal limits. No sizable hilar or mediastinal adenopathy is noted. The esophagus as visualized is within normal limits. Lungs/Pleura: Lungs are well aerated bilaterally. No focal infiltrate or sizable effusion is seen. No parenchymal nodules are noted. Previously seen left basilar atelectasis has resolved. Upper Abdomen: Visualized upper abdomen again shows fatty infiltration of the liver. Musculoskeletal: Degenerative changes of the thoracic spine are noted. Review of the MIP images confirms the above findings. IMPRESSION: Resolution of previously seen residual pulmonary emboli within the right pulmonary artery. No new pulmonary emboli are seen. Persistent dilatation of the ascending aorta to 4.2 cm. Recommend annual imaging followup by CTA or MRA. This recommendation follows 2010 ACCF/AHA/AATS/ACR/ASA/SCA/SCAI/SIR/STS/SVM Guidelines for the Diagnosis and Management of Patients with Thoracic Aortic Disease. Circulation. 2010; 121: L244-W102. Aortic aneurysm NOS (ICD10-I71.9) Fatty  liver Electronically Signed   By: Alcide Clever M.D.   On: 01/29/2021 19:57    Procedures Procedures   Medications Ordered in ED Medications  iohexol (OMNIPAQUE) 350 MG/ML injection 75 mL (75 mLs Intravenous Contrast Given 01/29/21 1904)    ED Course  I have reviewed the triage vital signs and the nursing notes.  Pertinent labs & imaging results that were available during my care of the patient were reviewed by me and considered in my medical decision making (see chart for details).    MDM Rules/Calculators/A&P                           55 year old female presents to the ED due to persistent shortness of breath x3 days.  History of PE on chronic Xarelto which she has been compliant with.  No associated chest pain.  Patient states she checked her oxygen saturation at home which was 91%.  Upon arrival, patient afebrile, not tachycardic or hypoxic.  Patient in no acute distress.  Reassuring physical exam.  No pitting edema bilaterally.  Lungs clear to auscultation bilaterally without wheeze.  Routine labs ordered, chest x-ray, EKG.  CTA chest given history of PE to rule out PE.   CBC unremarkable no leukocytosis and normal hemoglobin.  CMP significant for elevated creatinine 1.19 and BUN at 24.  No major electrolyte derangements.  Troponin normal at 3.  Low suspicion for ACS.  CTA personally reviewed which is negative for PE. It demonstrates persistent dilatation of ascending aorta with recommendation to repeat scan in 1 year. Patient made aware. Chest x-ray demonstrates occasional atelectasis.  No pneumonia or pneumothorax.  EKG demonstrates normal sinus rhythm with right bundle branch block.  No signs of acute ischemia.  Patient able to ambulate in the ED and maintain O2 saturation above 93%.  No episodes of hypoxia during her ED stay.  COVID/influenza negative.  Low suspicion for cardiac etiology.  BNP  normal.  Low suspicion for CHF.  Advised patient to follow-up with PCP within 1 week for  further evaluation. Strict ED precautions discussed with patient. Patient states understanding and agrees to plan. Patient discharged home in no acute distress and stable vitals  Discussed case with Dr. Particia Nearing who agrees with assessment and plan.  Final Clinical Impression(s) / ED Diagnoses Final diagnoses:  Shortness of breath    Rx / DC Orders ED Discharge Orders     None        Jesusita Oka 01/29/21 2059    Jacalyn Lefevre, MD 01/29/21 2115

## 2021-01-29 NOTE — ED Triage Notes (Signed)
Pt arrives to ED with c/o of SOB and fatigue for x3 days 9/18. Pt reports she is extremely weak. Pt with recent hx of COVID in June and a PE in July. No recent swelling in legs. Pt reports new DOE and dyspnea at rest. SpO2 at home as been 91%.

## 2021-01-29 NOTE — Discharge Instructions (Addendum)
It was a pleasure taking care of you today.  As discussed, your CT scan did not show any blood clots.  It did show dilation of your ascending aorta.  Please have repeat scan in 1 year.  All of your labs are reassuring.  Please follow-up with PCP within 1 week for further evaluation.  Return to the ER for new or worsening symptoms.

## 2021-01-29 NOTE — ED Notes (Signed)
Patient removed from oxygen at this time.

## 2021-01-30 ENCOUNTER — Encounter: Payer: Self-pay | Admitting: Pulmonary Disease

## 2021-01-30 ENCOUNTER — Ambulatory Visit (INDEPENDENT_AMBULATORY_CARE_PROVIDER_SITE_OTHER): Payer: Commercial Managed Care - PPO | Admitting: Pulmonary Disease

## 2021-01-30 VITALS — BP 126/72 | HR 80 | Ht 65.0 in | Wt 312.4 lb

## 2021-01-30 DIAGNOSIS — I2699 Other pulmonary embolism without acute cor pulmonale: Secondary | ICD-10-CM | POA: Diagnosis not present

## 2021-01-30 DIAGNOSIS — Z7901 Long term (current) use of anticoagulants: Secondary | ICD-10-CM | POA: Diagnosis not present

## 2021-01-30 DIAGNOSIS — E66813 Obesity, class 3: Secondary | ICD-10-CM

## 2021-01-30 NOTE — Progress Notes (Signed)
Synopsis: Referred in September 2022 for pulmonary embolism by Dois Davenport, MD  Subjective:   PATIENT ID: Erika Woods GENDER: female DOB: [birth year], MRN: [id number]  Chief Complaint  Patient presents with   Consult    Referred by PCP for history of PEs. Last PE was back in July 2022. States this has been doing on for the past 2 years.     This is a 55 year old female, past medical history of pulmonary embolism, first PE was in 2019-second PE in July 2020.,  Obesity, diabetes, coronary artery disease.  She has had 3 episodes of pulmonary embolism.  1 after a arthroscopic knee surgery.  She was treated for 6 months of anticoagulation then stopped.  She then developed a second pulmonary embolism and was started on anticoagulation indefinitely.  She missed approximately 8 days of anticoagulation during time in which she had COVID and developed a third PE.  So each time she is actually had pulmonary emboli have been off of anticoagulation.  She had work-up completed by primary care for hypercoagulability panel, prothrombin gene mutation and factor V Leiden negative.  She had a repeat echocardiogram in July that showed moderate right ventricular dysfunction.  She also has obstructive sleep apnea on CPAP, BMI 51.   Past Medical History:  Diagnosis Date   Acute pulmonary embolism ([name]) 11/11/2018   Acute pulmonary embolism with acute cor pulmonale ([name]) 09/16/2017   Acute superficial venous thrombosis of left lower extremity    Aortic insufficiency    Echocardiogram 08/2019: prob bicuspid AoV, mild to mod AI, mild AS (mean 12 mmHg), EF 55-60, no RWMA, Gr 1 DD, GLS -22.2%, normal RVSF, Ao Root 39 mm   Coronary CTA    Coronary CTA 08/2019: Calcium score 0, no evidence of CAD, ascending aorta 4 cm   Diabetes mellitus without complication ([name])    DM (diabetes mellitus) ([name]) 08/18/2011   Impaired fasting glucose    Mood disorder ([name]) 08/18/2011   Obesity, Class III, BMI 40-49.9 (morbid obesity)  ([name]) 09/16/2017   Obstructive sleep apnea on CPAP    Osteoarthritis of knee    Personal history of pulmonary embolism    PTSD (post-traumatic stress disorder) 08/18/2011   Pulmonary emboli ([name]) 09/15/2017   Renal disorder    kideny stones   Restless leg syndrome    Severe recurrent major depression without psychotic features ([name]) 02/17/2018   Sleep apnea 08/18/2011   Thoracic aortic aneurysm [facility])    Thoracic aortic aneurysm without rupture ([name])      Family History  Adopted: Yes     Past Surgical History:  Procedure Laterality Date   arm surgery Bilateral Skin removal   DILATATION & CURETTAGE/HYSTEROSCOPY WITH MYOSURE N/A 06/11/2018   Procedure: DILATATION & CURETTAGE/HYSTEROSCOPY WITH MYOSURE RESECTION OF ENDOMETRIAL THICKENING;  Surgeon: Richardean Chimera, MD;  Location: [facility];  Service: Gynecology;  Laterality: N/A;  BMI 56   DILATION AND EVACUATION     ESOPHAGOGASTRODUODENOSCOPY (EGD) WITH PROPOFOL N/A 07/15/2018   Procedure: ESOPHAGOGASTRODUODENOSCOPY (EGD) WITH PROPOFOL;  Surgeon: Willis Modena, MD;  Location: [facility];  Service: Endoscopy;  Laterality: N/A;   ESOPHAGOGASTRODUODENOSCOPY (EGD) WITH PROPOFOL N/A 04/27/2019   Procedure: ESOPHAGOGASTRODUODENOSCOPY (EGD) WITH PROPOFOL;  Surgeon: Willis Modena, MD;  Location: [facility];  Service: Endoscopy;  Laterality: N/A;   IR ABLATE LIVER CRYOABLATION  10/28/2019   IR ABLATE LIVER CRYOABLATION  05/07/2020   IR RADIOLOGIST EVAL & MGMT  10/14/2019   IR RADIOLOGIST EVAL & MGMT  04/03/2020  KIDNEY STONE SURGERY     KNEE SURGERY Right    LUMBAR LAMINECTOMY/DECOMPRESSION MICRODISCECTOMY N/A 11/08/2020   Procedure: LUMBAR FIVE AND SACRAL ONE LAMINECTOMY/DECOMPRESSION MICRODISCECTOMY 1 LEVEL;  Surgeon: Venita Lick, MD;  Location: [facility];  Service: Orthopedics;  Laterality: N/A;   TONSILLECTOMY      Social History   Socioeconomic History   Marital status: Married    Spouse name: Not on file   Number of children: 2   Years of  education: Not on file   Highest education level: Not on file  Occupational History   Occupation: Pre-school teacher  Tobacco Use   Smoking status: Never   Smokeless tobacco: Never  Vaping Use   Vaping Use: Never used  Substance and Sexual Activity   Alcohol use: Yes    Comment: "wine once or twice a month"   Drug use: No   Sexual activity: Not on file  Other Topics Concern   Not on file  Social History Narrative   Not on file   Social Determinants of Health   Financial Resource Strain: Not on file  Food Insecurity: Not on file  Transportation Needs: Not on file  Physical Activity: Not on file  Stress: Not on file  Social Connections: Not on file  Intimate Partner Violence: Not on file     Allergies  Allergen Reactions   Sulfa Antibiotics Hives     Outpatient Medications Prior to Visit  Medication Sig Dispense Refill   BREZTRI AEROSPHERE 160-9-4.8 MCG/ACT AERO Inhale 2 puffs into the lungs 2 (two) times daily as needed (wheezing/shortness of breath).     dextromethorphan (DELSYM) 30 MG/5ML liquid Take 15 mg by mouth as needed for cough.     doxepin (SINEQUAN) 10 MG capsule Take 50 mg by mouth at bedtime.     furosemide (LASIX) 20 MG tablet Take 1 tablet (20 mg total) by mouth daily as needed for edema (weight gain of >3lbs in 1 day or >5lbs in 2 days.). 30 tablet 0   gabapentin (NEURONTIN) 300 MG capsule Take 300 mg by mouth 3 (three) times daily.     guaiFENesin-dextromethorphan (ROBITUSSIN DM) 100-10 MG/5ML syrup Take 5 mLs by mouth every 4 (four) hours as needed for cough.     lamoTRIgine (LAMICTAL) 150 MG tablet Take 150 mg by mouth daily after breakfast.      metFORMIN (GLUCOPHAGE) 500 MG tablet Take 500 mg by mouth 2 (two) times daily with a meal.      ondansetron (ZOFRAN) 4 MG tablet Take 1 tablet (4 mg total) by mouth every 8 (eight) hours as needed for nausea or vomiting. 30 tablet 0   oxymetazoline (AFRIN) 0.05 % nasal spray Place 1 spray into both nostrils 2  (two) times daily as needed for congestion.     polyethylene glycol (MIRALAX / GLYCOLAX) 17 g packet Take 17 g by mouth daily as needed for mild constipation. 14 each 0   pseudoephedrine (SUDAFED) 30 MG tablet Take 30 mg by mouth every 4 (four) hours as needed for congestion.     rivaroxaban (XARELTO) 20 MG TABS tablet Take 1 tablet (20 mg total) by mouth daily with supper. 30 tablet 0   rOPINIRole (REQUIP) 4 MG tablet Take 8 mg by mouth at bedtime.     TRINTELLIX 20 MG TABS tablet Take 20 mg by mouth daily.     No facility-administered medications prior to visit.    Review of Systems  Constitutional:  Negative for chills, fever, malaise/fatigue  and weight loss.  HENT:  Negative for hearing loss, sore throat and tinnitus.   Eyes:  Negative for blurred vision and double vision.  Respiratory:  Positive for shortness of breath. Negative for cough, hemoptysis, sputum production, wheezing and stridor.   Cardiovascular:  Positive for leg swelling. Negative for chest pain, palpitations, orthopnea and PND.  Gastrointestinal:  Negative for abdominal pain, constipation, diarrhea, heartburn, nausea and vomiting.  Genitourinary:  Negative for dysuria, hematuria and urgency.  Musculoskeletal:  Negative for joint pain and myalgias.  Skin:  Negative for itching and rash.  Neurological:  Negative for dizziness, tingling, weakness and headaches.  Endo/Heme/Allergies:  Negative for environmental allergies. Does not bruise/bleed easily.  Psychiatric/Behavioral:  Negative for depression. The patient is not nervous/anxious and does not have insomnia.   All other systems reviewed and are negative.   Objective:  Physical Exam Vitals reviewed.  Constitutional:      General: She is not in acute distress.    Appearance: She is well-developed. She is obese.  HENT:     Head: Normocephalic and atraumatic.  Eyes:     General: No scleral icterus.    Conjunctiva/sclera: Conjunctivae normal.     Pupils: Pupils  are equal, round, and reactive to light.  Neck:     Vascular: No JVD.     Trachea: No tracheal deviation.  Cardiovascular:     Rate and Rhythm: Normal rate and regular rhythm.     Heart sounds: Normal heart sounds. No murmur heard. Pulmonary:     Effort: Pulmonary effort is normal. No tachypnea, accessory muscle usage or respiratory distress.     Breath sounds: No stridor. No wheezing, rhonchi or rales.  Abdominal:     General: Bowel sounds are normal. There is no distension.     Palpations: Abdomen is soft.     Tenderness: There is no abdominal tenderness.  Musculoskeletal:        General: No tenderness.     Cervical back: Neck supple.     Right lower leg: Edema present.     Left lower leg: Edema present.  Lymphadenopathy:     Cervical: No cervical adenopathy.  Skin:    General: Skin is warm and dry.     Capillary Refill: Capillary refill takes less than 2 seconds.     Findings: No rash.  Neurological:     Mental Status: She is alert and oriented to person, place, and time.  Psychiatric:        Behavior: Behavior normal.     Vitals:   01/30/21 1351  BP: 126/72  Pulse: 80  SpO2: 97%  Weight: (!) 312 lb 6.4 oz (141.7 kg)  Height: 5\' 5"  (1.651 m)   97% on RA BMI Readings from Last 3 Encounters:  01/30/21 51.99 kg/m  01/29/21 48.26 kg/m  11/05/20 48.28 kg/m   Wt Readings from Last 3 Encounters:  01/30/21 (!) 312 lb 6.4 oz (141.7 kg)  01/29/21 290 lb (131.5 kg)  11/05/20 290 lb 2 oz (131.6 kg)     CBC    Component Value Date/Time   WBC 7.2 01/29/2021 1711   RBC 4.27 01/29/2021 1711   HGB 12.0 01/29/2021 1711   HGB 12.5 04/18/2020 1326   HCT 38.4 01/29/2021 1711   HCT 37.7 04/18/2020 1326   PLT 263 01/29/2021 1711   PLT 265 04/18/2020 1326   MCV 89.9 01/29/2021 1711   MCV 85 04/18/2020 1326   MCH 28.1 01/29/2021 1711   MCHC 31.3 01/29/2021  1711   RDW 14.6 01/29/2021 1711   RDW 13.4 04/18/2020 1326   LYMPHSABS 1.7 01/29/2021 1711   MONOABS 0.3  01/29/2021 1711   EOSABS 0.5 01/29/2021 1711   BASOSABS 0.1 01/29/2021 1711      Chest Imaging: CTA chest 01/29/2021: No pulmonary emboli.  No acute parenchymal abnormality. The patient's images have been independently reviewed by me.    Pulmonary Functions Testing Results: No flowsheet data found.  FeNO:   Pathology:   Echocardiogram:   Heart Catheterization:     Assessment & Plan:     ICD-10-CM   1. Recurrent pulmonary emboli ([name])  I26.99 NM Pulmonary Per & Vent    2. Obesity, Class III, BMI 40-49.9 (morbid obesity) ([name])  E66.01     3. On continuous oral anticoagulation  Z79.01       Discussion:  55 year old morbidly obese female, BMI 51, recurrent pulmonary emboli.  She is had 3 episodes in her lifetime.  1 secondary to a surgery, second unprovoked, third off anticoagulation with COVID.  She has an echo with moderate right ventricular dysfunction.  Plan: Since last event was in July 2022.  Would recommend at least 6 months of anticoagulation followed by a VQ scan. After the VQ scan we will have her follow-up with one of her pulmonary hypertension specialist Dr. Judeth Horn to review results. Since she has had 3 separate VTE events may need to be followed closely for any evidence or the development of pulmonary hypertension. Patient is agreeable to this plan.    Current Outpatient Medications:    BREZTRI AEROSPHERE 160-9-4.8 MCG/ACT AERO, Inhale 2 puffs into the lungs 2 (two) times daily as needed (wheezing/shortness of breath)., Disp: , Rfl:    dextromethorphan (DELSYM) 30 MG/5ML liquid, Take 15 mg by mouth as needed for cough., Disp: , Rfl:    doxepin (SINEQUAN) 10 MG capsule, Take 50 mg by mouth at bedtime., Disp: , Rfl:    furosemide (LASIX) 20 MG tablet, Take 1 tablet (20 mg total) by mouth daily as needed for edema (weight gain of >3lbs in 1 day or >5lbs in 2 days.)., Disp: 30 tablet, Rfl: 0   gabapentin (NEURONTIN) 300 MG capsule, Take 300 mg by mouth 3  (three) times daily., Disp: , Rfl:    guaiFENesin-dextromethorphan (ROBITUSSIN DM) 100-10 MG/5ML syrup, Take 5 mLs by mouth every 4 (four) hours as needed for cough., Disp: , Rfl:    lamoTRIgine (LAMICTAL) 150 MG tablet, Take 150 mg by mouth daily after breakfast. , Disp: , Rfl:    metFORMIN (GLUCOPHAGE) 500 MG tablet, Take 500 mg by mouth 2 (two) times daily with a meal. , Disp: , Rfl:    ondansetron (ZOFRAN) 4 MG tablet, Take 1 tablet (4 mg total) by mouth every 8 (eight) hours as needed for nausea or vomiting., Disp: 30 tablet, Rfl: 0   oxymetazoline (AFRIN) 0.05 % nasal spray, Place 1 spray into both nostrils 2 (two) times daily as needed for congestion., Disp: , Rfl:    polyethylene glycol (MIRALAX / GLYCOLAX) 17 g packet, Take 17 g by mouth daily as needed for mild constipation., Disp: 14 each, Rfl: 0   pseudoephedrine (SUDAFED) 30 MG tablet, Take 30 mg by mouth every 4 (four) hours as needed for congestion., Disp: , Rfl:    rivaroxaban (XARELTO) 20 MG TABS tablet, Take 1 tablet (20 mg total) by mouth daily with supper., Disp: 30 tablet, Rfl: 0   rOPINIRole (REQUIP) 4 MG tablet, Take 8 mg by  mouth at bedtime., Disp: , Rfl:    TRINTELLIX 20 MG TABS tablet, Take 20 mg by mouth daily., Disp: , Rfl:    Josephine Igo, DO [facility] 01/30/2021 3:31 PM

## 2021-01-30 NOTE — Patient Instructions (Addendum)
Thank you for visiting Dr. Tonia Brooms at [facility]. Today we recommend the following:  Orders Placed This Encounter  Procedures   NM Pulmonary Per & Vent   Return if symptoms worsen or fail to improve.  Please schedule with Dr. Judeth Horn in Jan 2023 after VQ Scan complete     Please do your part to reduce the spread of COVID-19.

## 2021-02-11 ENCOUNTER — Telehealth: Payer: Self-pay | Admitting: Pulmonary Disease

## 2021-02-11 NOTE — Telephone Encounter (Signed)
Called and spoke with pt and she is aware that she will need to have the VQ scan done prior to her APPT with [facility].  This is already listed in the Recall order.  Nothing further is needed.

## 2021-02-11 NOTE — Telephone Encounter (Signed)
ATC Patient. LM to cal back. Per pulmonary perf and vent orders placed by Dr. Tonia Brooms.  Test needs scheduled for 05/2021.

## 2021-02-18 ENCOUNTER — Encounter (HOSPITAL_BASED_OUTPATIENT_CLINIC_OR_DEPARTMENT_OTHER): Payer: Self-pay | Admitting: Emergency Medicine

## 2021-02-18 ENCOUNTER — Ambulatory Visit: Payer: Commercial Managed Care - PPO | Admitting: Adult Health

## 2021-02-18 ENCOUNTER — Other Ambulatory Visit: Payer: Self-pay

## 2021-02-18 ENCOUNTER — Encounter: Payer: Self-pay | Admitting: Adult Health

## 2021-02-18 ENCOUNTER — Telehealth: Payer: Self-pay | Admitting: Adult Health

## 2021-02-18 ENCOUNTER — Telehealth: Payer: Self-pay

## 2021-02-18 ENCOUNTER — Ambulatory Visit (HOSPITAL_BASED_OUTPATIENT_CLINIC_OR_DEPARTMENT_OTHER)
Admission: RE | Admit: 2021-02-18 | Discharge: 2021-02-18 | Disposition: A | Discharge status: Home / Self Care | Payer: Commercial Managed Care - PPO | Source: Ambulatory Visit | Attending: Adult Health | Admitting: Adult Health

## 2021-02-18 ENCOUNTER — Encounter (HOSPITAL_BASED_OUTPATIENT_CLINIC_OR_DEPARTMENT_OTHER): Payer: Self-pay

## 2021-02-18 ENCOUNTER — Inpatient Hospital Stay (HOSPITAL_BASED_OUTPATIENT_CLINIC_OR_DEPARTMENT_OTHER)
Admission: EM | Admit: 2021-02-18 | Discharge: 2021-02-21 | DRG: 176 | Disposition: A | Discharge status: Home / Self Care | Payer: Commercial Managed Care - PPO | Attending: Internal Medicine | Admitting: Internal Medicine

## 2021-02-18 VITALS — BP 138/78 | HR 92 | Temp 97.8°F | Ht 65.0 in | Wt 309.8 lb

## 2021-02-18 DIAGNOSIS — I2699 Other pulmonary embolism without acute cor pulmonale: Secondary | ICD-10-CM | POA: Insufficient documentation

## 2021-02-18 DIAGNOSIS — M170 Bilateral primary osteoarthritis of knee: Secondary | ICD-10-CM | POA: Diagnosis present

## 2021-02-18 DIAGNOSIS — Z6841 Body Mass Index (BMI) 40.0 and over, adult: Secondary | ICD-10-CM | POA: Diagnosis not present

## 2021-02-18 DIAGNOSIS — G4730 Sleep apnea, unspecified: Secondary | ICD-10-CM | POA: Diagnosis present

## 2021-02-18 DIAGNOSIS — I712 Thoracic aortic aneurysm, without rupture, unspecified: Secondary | ICD-10-CM | POA: Diagnosis present

## 2021-02-18 DIAGNOSIS — G4733 Obstructive sleep apnea (adult) (pediatric): Secondary | ICD-10-CM | POA: Diagnosis present

## 2021-02-18 DIAGNOSIS — E1169 Type 2 diabetes mellitus with other specified complication: Secondary | ICD-10-CM

## 2021-02-18 DIAGNOSIS — G2581 Restless legs syndrome: Secondary | ICD-10-CM | POA: Diagnosis present

## 2021-02-18 DIAGNOSIS — R0902 Hypoxemia: Secondary | ICD-10-CM

## 2021-02-18 DIAGNOSIS — K5900 Constipation, unspecified: Secondary | ICD-10-CM | POA: Diagnosis present

## 2021-02-18 DIAGNOSIS — T45516A Underdosing of anticoagulants, initial encounter: Secondary | ICD-10-CM | POA: Diagnosis present

## 2021-02-18 DIAGNOSIS — F4310 Post-traumatic stress disorder, unspecified: Secondary | ICD-10-CM | POA: Diagnosis present

## 2021-02-18 DIAGNOSIS — Z20822 Contact with and (suspected) exposure to covid-19: Secondary | ICD-10-CM | POA: Diagnosis present

## 2021-02-18 DIAGNOSIS — F332 Major depressive disorder, recurrent severe without psychotic features: Secondary | ICD-10-CM | POA: Diagnosis present

## 2021-02-18 DIAGNOSIS — I2602 Saddle embolus of pulmonary artery with acute cor pulmonale: Secondary | ICD-10-CM | POA: Diagnosis not present

## 2021-02-18 DIAGNOSIS — Z7901 Long term (current) use of anticoagulants: Secondary | ICD-10-CM | POA: Diagnosis not present

## 2021-02-18 DIAGNOSIS — Z23 Encounter for immunization: Secondary | ICD-10-CM | POA: Diagnosis not present

## 2021-02-18 DIAGNOSIS — Z882 Allergy status to sulfonamides status: Secondary | ICD-10-CM | POA: Diagnosis not present

## 2021-02-18 DIAGNOSIS — E119 Type 2 diabetes mellitus without complications: Secondary | ICD-10-CM

## 2021-02-18 LAB — BASIC METABOLIC PANEL
Anion gap: 9 (ref 5–15)
BUN: 20 mg/dL (ref 6–23)
BUN: 21 mg/dL — ABNORMAL HIGH (ref 6–20)
CO2: 27 mEq/L (ref 19–32)
CO2: 24 mmol/L (ref 22–32)
Calcium: 9.7 mg/dL (ref 8.4–10.5)
Calcium: 9 mg/dL (ref 8.9–10.3)
Chloride: 106 mEq/L (ref 96–112)
Chloride: 106 mmol/L (ref 98–111)
Creatinine, Ser: 1.2 mg/dL (ref 0.40–1.20)
Creatinine, Ser: 1.09 mg/dL — ABNORMAL HIGH (ref 0.44–1.00)
GFR, Estimated: 60 mL/min — ABNORMAL LOW (ref >60)
GFR: 50.58 mL/min — ABNORMAL LOW (ref >60.00)
Glucose, Bld: 123 mg/dL — ABNORMAL HIGH (ref 70–99)
Glucose, Bld: 103 mg/dL — ABNORMAL HIGH (ref 70–99)
Potassium: 4.5 mEq/L (ref 3.5–5.1)
Potassium: 4.2 mmol/L (ref 3.5–5.1)
Sodium: 143 mEq/L (ref 135–145)
Sodium: 139 mmol/L (ref 135–145)

## 2021-02-18 LAB — CBC WITH DIFFERENTIAL/PLATELET
Abs Immature Granulocytes: 0.03 10*3/uL (ref 0.00–0.07)
Basophils Absolute: 0.1 10*3/uL (ref 0.0–0.1)
Basophils Absolute: 0.1 10*3/uL (ref 0.0–0.1)
Basophils Relative: 0.7 % (ref 0.0–3.0)
Basophils Relative: 1 %
Eosinophils Absolute: 0.3 10*3/uL (ref 0.0–0.7)
Eosinophils Absolute: 0.3 10*3/uL (ref 0.0–0.5)
Eosinophils Relative: 3.8 % (ref 0.0–5.0)
Eosinophils Relative: 4 %
HCT: 39.6 % (ref 36.0–46.0)
HCT: 39.6 % (ref 36.0–46.0)
Hemoglobin: 12.9 g/dL (ref 12.0–15.0)
Hemoglobin: 12.3 g/dL (ref 12.0–15.0)
Immature Granulocytes: 0 %
Lymphocytes Relative: 20.6 % (ref 12.0–46.0)
Lymphocytes Relative: 21 %
Lymphs Abs: 1.8 10*3/uL (ref 0.7–4.0)
Lymphs Abs: 1.8 10*3/uL (ref 0.7–4.0)
MCH: 27.7 pg (ref 26.0–34.0)
MCHC: 32.5 g/dL (ref 30.0–36.0)
MCHC: 31.1 g/dL (ref 30.0–36.0)
MCV: 87.5 fl (ref 78.0–100.0)
MCV: 89.2 fL (ref 80.0–100.0)
Monocytes Absolute: 0.5 10*3/uL (ref 0.1–1.0)
Monocytes Absolute: 0.4 10*3/uL (ref 0.1–1.0)
Monocytes Relative: 5.6 % (ref 3.0–12.0)
Monocytes Relative: 5 %
Neutro Abs: 6.1 10*3/uL (ref 1.4–7.7)
Neutro Abs: 5.7 10*3/uL (ref 1.7–7.7)
Neutrophils Relative %: 69.3 % (ref 43.0–77.0)
Neutrophils Relative %: 69 %
Platelets: 248 10*3/uL (ref 150.0–400.0)
Platelets: 236 10*3/uL (ref 150–400)
RBC: 4.52 Mil/uL (ref 3.87–5.11)
RBC: 4.44 MIL/uL (ref 3.87–5.11)
RDW: 15.3 % (ref 11.5–15.5)
RDW: 14.3 % (ref 11.5–15.5)
WBC: 8.7 10*3/uL (ref 4.0–10.5)
WBC: 8.3 10*3/uL (ref 4.0–10.5)
nRBC: 0 % (ref 0.0–0.2)

## 2021-02-18 LAB — RESP PANEL BY RT-PCR (FLU A&B, COVID) ARPGX2
Influenza A by PCR: NEGATIVE
Influenza B by PCR: NEGATIVE
SARS Coronavirus 2 by RT PCR: NEGATIVE

## 2021-02-18 LAB — TROPONIN I (HIGH SENSITIVITY)
Troponin I (High Sensitivity): 14 ng/L (ref <18)
Troponin I (High Sensitivity): 21 ng/L — ABNORMAL HIGH (ref <18)

## 2021-02-18 LAB — BRAIN NATRIURETIC PEPTIDE: B Natriuretic Peptide: 110.5 pg/mL — ABNORMAL HIGH (ref 0.0–100.0)

## 2021-02-18 LAB — GLUCOSE, CAPILLARY: Glucose-Capillary: 160 mg/dL — ABNORMAL HIGH (ref 70–99)

## 2021-02-18 MED ORDER — DIPHENHYDRAMINE HCL 25 MG PO CAPS
25.0000 mg | ORAL_CAPSULE | Freq: Four times a day (QID) | ORAL | Status: DC | PRN
  Administered 2021-02-18: 25 mg via ORAL
  Filled 2021-02-18: qty 1

## 2021-02-18 MED ORDER — IOHEXOL 350 MG/ML SOLN
100.0000 mL | Freq: Once | INTRAVENOUS | Status: AC | PRN
  Administered 2021-02-18: 100 mL via INTRAVENOUS

## 2021-02-18 MED ORDER — VORTIOXETINE HBR 20 MG PO TABS
20.0000 mg | ORAL_TABLET | Freq: Every day | ORAL | Status: DC
  Administered 2021-02-19 – 2021-02-21 (×3): 20 mg via ORAL
  Filled 2021-02-18 (×3): qty 1

## 2021-02-18 MED ORDER — GABAPENTIN 300 MG PO CAPS
300.0000 mg | ORAL_CAPSULE | Freq: Three times a day (TID) | ORAL | Status: DC
  Administered 2021-02-18 – 2021-02-21 (×8): 300 mg via ORAL
  Filled 2021-02-18 (×8): qty 1

## 2021-02-18 MED ORDER — ROPINIROLE HCL 1 MG PO TABS
8.0000 mg | ORAL_TABLET | Freq: Every day | ORAL | Status: DC
  Administered 2021-02-18 – 2021-02-20 (×3): 8 mg via ORAL
  Filled 2021-02-18 (×3): qty 8

## 2021-02-18 MED ORDER — INSULIN ASPART 100 UNIT/ML IJ SOLN
0.0000 [IU] | Freq: Three times a day (TID) | INTRAMUSCULAR | Status: DC
  Administered 2021-02-20: 3 [IU] via SUBCUTANEOUS

## 2021-02-18 MED ORDER — HEPARIN BOLUS VIA INFUSION
5500.0000 [IU] | Freq: Once | INTRAVENOUS | Status: AC
  Administered 2021-02-18: 5500 [IU] via INTRAVENOUS

## 2021-02-18 MED ORDER — LAMOTRIGINE 100 MG PO TABS
150.0000 mg | ORAL_TABLET | Freq: Every day | ORAL | Status: DC
  Administered 2021-02-19 – 2021-02-21 (×3): 150 mg via ORAL
  Filled 2021-02-18 (×3): qty 2

## 2021-02-18 MED ORDER — HEPARIN (PORCINE) 25000 UT/250ML-% IV SOLN
1800.0000 [IU]/h | INTRAVENOUS | Status: DC
  Administered 2021-02-18: 1650 [IU]/h via INTRAVENOUS
  Administered 2021-02-19: 1800 [IU]/h via INTRAVENOUS
  Filled 2021-02-18 (×2): qty 250

## 2021-02-18 MED ORDER — SODIUM CHLORIDE 0.9% FLUSH
3.0000 mL | Freq: Two times a day (BID) | INTRAVENOUS | Status: DC
  Administered 2021-02-19 – 2021-02-21 (×5): 3 mL via INTRAVENOUS

## 2021-02-18 MED ORDER — ACETAMINOPHEN 650 MG RE SUPP: 650.0000 mg | Freq: Four times a day (QID) | RECTAL | Status: DC | PRN

## 2021-02-18 MED ORDER — POLYETHYLENE GLYCOL 3350 17 G PO PACK
17.0000 g | PACK | Freq: Every day | ORAL | Status: DC | PRN
  Administered 2021-02-20: 17 g via ORAL
  Filled 2021-02-18: qty 1

## 2021-02-18 MED ORDER — ACETAMINOPHEN 325 MG PO TABS
650.0000 mg | ORAL_TABLET | Freq: Four times a day (QID) | ORAL | Status: DC | PRN
  Administered 2021-02-19: 650 mg via ORAL
  Filled 2021-02-18: qty 2

## 2021-02-18 NOTE — ED Triage Notes (Signed)
Pt arrives to ED from Pulmonology with CTA Chest scan today showing nearly occlusive, lobar to segmental embolus throughout the right lung with concern for right heart strain. Pt reports she started feeling SOB on 10/7. She is experiencing extreme DOE that only resolves with rest.

## 2021-02-18 NOTE — Assessment & Plan Note (Signed)
Continue on CPAP at bedtime 

## 2021-02-18 NOTE — Telephone Encounter (Signed)
Spoke with Melissa with [employer] to ensure pt gets home O2 at latest tomorrow 02/19/21. Pt was give home concentrator. New O2 order was placed and signed by Rubye Oaks, NP

## 2021-02-18 NOTE — Assessment & Plan Note (Signed)
Exertional hypoxemia questionable etiology.   Patient had a break in therapy with her anticoagulation.  We will need to rule out PE.  CTA with PE protocol pending.  Begin oxygen 2 L with activity to maintain O2 saturation greater than 88 to 90%. Home concentrator was sent from our office.  DME company has been contacted to set up oxygen at home.  Plan  Patient Instructions  Stat CT chest today  Labs today .  Hold Metformin x 2 days then resume Begin Oxygen 2l/m with activity and At bedtime  . Goal is O2 sats >88-90% Activity as tolerated.  Follow in 1 week and As needed   Please contact office for sooner follow up if symptoms do not improve or worsen or seek emergency care

## 2021-02-18 NOTE — Assessment & Plan Note (Signed)
Patient will hold metformin for the next 2 days due to contrasted CT today. Continue follow-up with PCP for diabetes. Advise if blood sugars are greater than 250 to call our office or PCP.

## 2021-02-18 NOTE — Progress Notes (Signed)
55 year old lady with prior h/o PE recently diagnosed , missed a week of xarelto due to ( prior auth), presented with respiratory symptoms to the pulmonary clinic, sent to ED, was found to have submassive PE with right heart strain.  Pts vitals were wnl. ED discussed case with PCCM, deferred the admission to [facility].  Pt is on RA with sats around 93%.  Will be admitted to telemetry , started on IV heparin.  She will need echocardiogram for further evaluation of right heart strain.    Kathlen Mody, MD

## 2021-02-18 NOTE — Assessment & Plan Note (Signed)
Healthy weight loss discussed 

## 2021-02-18 NOTE — ED Provider Notes (Signed)
[facility] EMERGENCY DEPT Provider Note   CSN: [id number] Arrival date & time: 02/18/21  1654     History Chief Complaint  Patient presents with   Pulmonary Embolism    Erika Woods is a 55 y.o. female.  Sent from doctor's office with blood clot.  She is on Xarelto for history of PE.  However she was off Xarelto for for 5 days recently because of insurance issues.  She has been back on Xarelto the last 7 days.  Last dose was this morning.  She has been having shortness of breath with exertion.  Denies any chest pain.  No syncope episodes.  The history is provided by the patient.  Shortness of Breath Severity:  Moderate Timing:  Constant Progression:  Unchanged Chronicity:  New Context: activity   Relieved by:  Nothing Worsened by:  Nothing Associated symptoms: no abdominal pain, no chest pain, no cough, no ear pain, no fever, no rash, no sore throat and no vomiting   Risk factors: hx of PE/DVT       Past Medical History:  Diagnosis Date   Acute pulmonary embolism ([name]) 11/11/2018   Acute pulmonary embolism with acute cor pulmonale ([name]) 09/16/2017   Acute superficial venous thrombosis of left lower extremity    Aortic insufficiency    Echocardiogram 08/2019: prob bicuspid AoV, mild to mod AI, mild AS (mean 12 mmHg), EF 55-60, no RWMA, Gr 1 DD, GLS -22.2%, normal RVSF, Ao Root 39 mm   Coronary CTA    Coronary CTA 08/2019: Calcium score 0, no evidence of CAD, ascending aorta 4 cm   Diabetes mellitus without complication ([name])    DM (diabetes mellitus) ([name]) 08/18/2011   Impaired fasting glucose    Mood disorder ([name]) 08/18/2011   Obesity, Class III, BMI 40-49.9 (morbid obesity) ([name]) 09/16/2017   Obstructive sleep apnea on CPAP    Osteoarthritis of knee    Personal history of pulmonary embolism    PTSD (post-traumatic stress disorder) 08/18/2011   Pulmonary emboli ([name]) 09/15/2017   Renal disorder    kideny stones   Restless leg syndrome    Severe recurrent major  depression without psychotic features ([name]) 02/17/2018   Sleep apnea 08/18/2011   Thoracic aortic aneurysm    Thoracic aortic aneurysm without rupture     Patient Active Problem List   Diagnosis Date Noted   Recurrent pulmonary emboli ([name]) 02/18/2021   Hypoxemia 02/18/2021   Pulmonary embolus, right ([name]) 02/18/2021   Lumbar disc herniation 11/08/2020   Radiculopathy 11/05/2020   COVID-19 virus detected 06/07/2019   Acute pulmonary embolism ([name]) 11/11/2018   Severe recurrent major depression without psychotic features ([name]) 02/17/2018   Obesity, Class III, BMI 40-49.9 (morbid obesity) ([name]) 09/16/2017   Acute pulmonary embolism with acute cor pulmonale ([name]) 09/16/2017   Acute superficial venous thrombosis of left lower extremity    Thoracic aortic aneurysm without rupture    Pulmonary emboli ([name]) 09/15/2017   PTSD (post-traumatic stress disorder) 08/18/2011   DM (diabetes mellitus) ([name]) 08/18/2011   Sleep apnea 08/18/2011   Mood disorder ([name]) 08/18/2011    Past Surgical History:  Procedure Laterality Date   arm surgery Bilateral Skin removal   DILATATION & CURETTAGE/HYSTEROSCOPY WITH MYOSURE N/A 06/11/2018   Procedure: DILATATION & CURETTAGE/HYSTEROSCOPY WITH MYOSURE RESECTION OF ENDOMETRIAL THICKENING;  Surgeon: Richardean Chimera, MD;  Location: [facility];  Service: Gynecology;  Laterality: N/A;  BMI 56   DILATION AND EVACUATION     ESOPHAGOGASTRODUODENOSCOPY (EGD) WITH PROPOFOL N/A  07/15/2018   Procedure: ESOPHAGOGASTRODUODENOSCOPY (EGD) WITH PROPOFOL;  Surgeon: Willis Modena, MD;  Location: [facility];  Service: Endoscopy;  Laterality: N/A;   ESOPHAGOGASTRODUODENOSCOPY (EGD) WITH PROPOFOL N/A 04/27/2019   Procedure: ESOPHAGOGASTRODUODENOSCOPY (EGD) WITH PROPOFOL;  Surgeon: Willis Modena, MD;  Location: [facility];  Service: Endoscopy;  Laterality: N/A;   IR ABLATE LIVER CRYOABLATION  10/28/2019   IR ABLATE LIVER CRYOABLATION  05/07/2020   IR RADIOLOGIST EVAL & MGMT  10/14/2019    IR RADIOLOGIST EVAL & MGMT  04/03/2020   KIDNEY STONE SURGERY     KNEE SURGERY Right    LUMBAR LAMINECTOMY/DECOMPRESSION MICRODISCECTOMY N/A 11/08/2020   Procedure: LUMBAR FIVE AND SACRAL ONE LAMINECTOMY/DECOMPRESSION MICRODISCECTOMY 1 LEVEL;  Surgeon: Venita Lick, MD;  Location: [facility];  Service: Orthopedics;  Laterality: N/A;   TONSILLECTOMY       OB History   No obstetric history on file.     Family History  Adopted: Yes    Social History   Tobacco Use   Smoking status: Never   Smokeless tobacco: Never  Vaping Use   Vaping Use: Never used  Substance Use Topics   Alcohol use: Yes    Comment: "wine once or twice a month"   Drug use: No    Home Medications Prior to Admission medications   Medication Sig Start Date End Date Taking? Authorizing Provider  rivaroxaban (XARELTO) 20 MG TABS tablet Take 1 tablet (20 mg total) by mouth daily with supper. 11/12/20  Yes Rolly Salter, MD  BREZTRI AEROSPHERE 160-9-4.8 MCG/ACT AERO Inhale 2 puffs into the lungs 2 (two) times daily as needed (wheezing/shortness of breath). Patient not taking: Reported on 02/18/2021 10/15/20   [provider]  dextromethorphan (DELSYM) 30 MG/5ML liquid Take 15 mg by mouth as needed for cough.    [provider]  doxepin (SINEQUAN) 10 MG capsule Take 50 mg by mouth at bedtime. 03/12/20   [provider]  furosemide (LASIX) 20 MG tablet Take 1 tablet (20 mg total) by mouth daily as needed for edema (weight gain of >3lbs in 1 day or >5lbs in 2 days.). 11/12/20 11/12/21  Rolly Salter, MD  gabapentin (NEURONTIN) 300 MG capsule Take 300 mg by mouth 3 (three) times daily. 04/01/20   [provider]  guaiFENesin-dextromethorphan (ROBITUSSIN DM) 100-10 MG/5ML syrup Take 5 mLs by mouth every 4 (four) hours as needed for cough.    [provider]  lamoTRIgine (LAMICTAL) 150 MG tablet Take 150 mg by mouth daily after breakfast.  06/30/18   [provider]   metFORMIN (GLUCOPHAGE) 500 MG tablet Take 500 mg by mouth 2 (two) times daily with a meal.  10/28/13   [provider]  ondansetron (ZOFRAN) 4 MG tablet Take 1 tablet (4 mg total) by mouth every 8 (eight) hours as needed for nausea or vomiting. 11/12/20   Darrick Grinder, PA  oxymetazoline (AFRIN) 0.05 % nasal spray Place 1 spray into both nostrils 2 (two) times daily as needed for congestion.    [provider]  polyethylene glycol (MIRALAX / GLYCOLAX) 17 g packet Take 17 g by mouth daily as needed for mild constipation. 11/12/20   Rolly Salter, MD  pseudoephedrine (SUDAFED) 30 MG tablet Take 30 mg by mouth every 4 (four) hours as needed for congestion.    [provider]  rOPINIRole (REQUIP) 4 MG tablet Take 8 mg by mouth at bedtime. 11/03/20   [provider]  TRINTELLIX 20 MG TABS tablet Take  20 mg by mouth daily. 05/13/19   [provider]    Allergies    Sulfa antibiotics  Review of Systems   Review of Systems  Constitutional:  Negative for chills and fever.  HENT:  Negative for ear pain and sore throat.   Eyes:  Negative for pain and visual disturbance.  Respiratory:  Positive for shortness of breath. Negative for cough.   Cardiovascular:  Negative for chest pain and palpitations.  Gastrointestinal:  Negative for abdominal pain and vomiting.  Genitourinary:  Negative for dysuria and hematuria.  Musculoskeletal:  Negative for arthralgias and back pain.  Skin:  Negative for color change and rash.  Neurological:  Negative for seizures and syncope.  All other systems reviewed and are negative.  Physical Exam Updated Vital Signs BP 130/83 (BP Location: Left Arm)   Pulse 84   Temp 98.4 F (36.9 C) (Oral)   Resp (!) 22   LMP 01/29/2013   SpO2 93%   Physical Exam Vitals and nursing note reviewed.  Constitutional:      General: She is not in acute distress.    Appearance: She is well-developed. She is not ill-appearing.  HENT:      Head: Normocephalic and atraumatic.     Nose: Nose normal.     Mouth/Throat:     Mouth: Mucous membranes are moist.  Eyes:     Extraocular Movements: Extraocular movements intact.     Conjunctiva/sclera: Conjunctivae normal.     Pupils: Pupils are equal, round, and reactive to light.  Cardiovascular:     Rate and Rhythm: Normal rate and regular rhythm.     Pulses: Normal pulses.     Heart sounds: Normal heart sounds. No murmur heard. Pulmonary:     Effort: No respiratory distress.     Comments: Mildly increased work of breathing Abdominal:     Palpations: Abdomen is soft.     Tenderness: There is no abdominal tenderness.  Musculoskeletal:     Cervical back: Normal range of motion and neck supple.  Skin:    General: Skin is warm and dry.     Capillary Refill: Capillary refill takes less than 2 seconds.  Neurological:     General: No focal deficit present.     Mental Status: She is alert.    ED Results / Procedures / Treatments   Labs (all labs ordered are listed, but only abnormal results are displayed) Labs Reviewed  BASIC METABOLIC PANEL - Abnormal; Notable for the following components:      Result Value   Glucose, Bld 103 (*)    BUN 21 (*)    Creatinine, Ser 1.09 (*)    GFR, Estimated 60 (*)    All other components within normal limits  BRAIN NATRIURETIC PEPTIDE - Abnormal; Notable for the following components:   B Natriuretic Peptide 110.5 (*)    All other components within normal limits  RESP PANEL BY RT-PCR (FLU A&B, COVID) ARPGX2  CBC WITH DIFFERENTIAL/PLATELET  APTT  APTT  HEPARIN LEVEL (UNFRACTIONATED)  CBC  TROPONIN I (HIGH SENSITIVITY)    EKG None  Radiology CT Angio Chest W/Cm &/Or Wo Cm  Result Date: 02/18/2021 CLINICAL DATA:  Recurrent PE, new onset shortness of breath EXAM: CT ANGIOGRAPHY CHEST WITH CONTRAST TECHNIQUE: Multidetector CT imaging of the chest was performed using the standard protocol during bolus administration of intravenous  contrast. Multiplanar CT image reconstructions and MIPs were obtained to evaluate the vascular anatomy. CONTRAST:  OMNIPAQUE IOHEXOL 350 MG/ML  SOLN COMPARISON:  01/29/2021 FINDINGS: Cardiovascular: Satisfactory opacification of the pulmonary arteries to the segmental level. There is nearly occlusive, lobar to segmental embolus throughout the right lung (series 4, image 48, 67, 76). No embolus identified in the left lung.Global cardiomegaly. Mildly elevated RV LV ratio, 1.1-1. Gross enlargement of the main pulmonary artery, measuring up to 4.2 cm in caliber. No pericardial effusion. Mediastinum/Nodes: No enlarged mediastinal, hilar, or axillary lymph nodes. Small hiatal hernia. Thyroid gland, trachea, and esophagus demonstrate no significant findings. Lungs/Pleura: Diffuse mosaic attenuation of the airspaces. No pleural effusion or pneumothorax. Upper Abdomen: No acute abnormality.  Hepatic steatosis. Musculoskeletal: No chest wall abnormality. No acute or significant osseous findings. Review of the MIP images confirms the above findings. IMPRESSION: 1. There is nearly occlusive, lobar to segmental embolus throughout the right lung. No embolus identified in the left lung. 2. Global cardiomegaly. Mildly elevated RV LV ratio, 1.1-1. Gross enlargement of the main pulmonary artery. Findings are consistent with submassive pulmonary embolus and concerning for right heart strain. Correlate with echocardiographic findings. 3. Diffuse mosaic attenuation of the airspaces, suggestive of small airways disease and or chronic thromboembolic disease. 4. Hepatic steatosis. Call report request was placed at the time of interpretation. Final communication will be documented. Electronically Signed   By: Jearld Lesch M.D.   On: 02/18/2021 16:36    Procedures .Critical Care Performed by: Virgina Norfolk, DO Authorized by: Virgina Norfolk, DO   Critical care provider statement:    Critical care time (minutes):  35   Critical  care was necessary to treat or prevent imminent or life-threatening deterioration of the following conditions: acute Pulmonary embolism with right heart strain.   Critical care was time spent personally by me on the following activities:  Blood draw for specimens, development of treatment plan with patient or surrogate, discussions with consultants, discussions with primary provider, evaluation of patient's response to treatment, obtaining history from patient or surrogate, ordering and performing treatments and interventions, ordering and review of laboratory studies, ordering and review of radiographic studies, pulse oximetry, re-evaluation of patient's condition and review of old charts   Care discussed with: admitting provider     Medications Ordered in ED Medications  heparin bolus via infusion 5,500 Units (has no administration in time range)  heparin ADULT infusion 100 units/mL (25000 units/218mL) (has no administration in time range)    ED Course  I have reviewed the triage vital signs and the nursing notes.  Pertinent labs & imaging results that were available during my care of the patient were reviewed by me and considered in my medical decision making (see chart for details).    MDM Rules/Calculators/A&P                           Erika Woods is here for pulmonary embolism.  Normal vitals.  No fever.  History of the same.  Was off Xarelto for 5 days due to insurance issues.  Has been back on Xarelto for a week.  However was having worsening shortness of breath.  Her pulmonologist ordered a CT scan that showed right-sided blood clot with right heart strain.  Hemodynamically she stable.  Lab work overall unremarkable.  Talked with pulmonology on the phone and we will restart her on IV heparin and have her admitted for echocardiogram and further care.  Admitted to the hospitalist service on heparin drip for acute PE with right heart strain.  Troponin within normal limits.  This chart  was dictated using voice recognition software.  Despite best efforts to proofread,  errors can occur which can change the documentation meaning.   Final Clinical Impression(s) / ED Diagnoses Final diagnoses:  Acute pulmonary embolism, unspecified pulmonary embolism type, unspecified whether acute cor pulmonale present [facility])    Rx / DC Orders ED Discharge Orders     None        Virgina Norfolk, DO 02/18/21 1750

## 2021-02-18 NOTE — H&P (Addendum)
History and Physical   Erika Woods QHU:[id number] DOB: Apr 28, 1966 DOA: 02/18/2021  PCP: Dois Davenport, MD   Patient coming from: Home  Chief Complaint: Shortness of breath  HPI: Erika Woods is a 55 y.o. female with medical history significant of recurrent pulmonary embolism, diabetes, degenerative disc disease, depression, OSA, thoracic aortic aneurysm presenting with ongoing shortness of breath.  Patient has had shortness of breath that has been worsening for the past several days.  She has known history of recurrent PE on chronic anticoagulation.  She had stopped taking her Xarelto for 5 days due to issues with her insurance where they refused to pay for the medication when she needed the refill.  She has been back on the medication for the past 7 days per her report.  She had some progressive shortness of breath especially on exertion.  She initially went to her pulmonologist for evaluation for this and was sent to get a CTA.  This showed lobar to segmental emboli at the right lung no emboli at the left lung.  Cardiomegaly noted and RV to LV ratio of 1.1-1.  Findings consistent with submassive PE and possible right heart strain given elevated pulmonary artery pressures.  Hepatic steatosis also noted.  She was sent to the ED for further evaluation and anticoagulation.    She has here symptoms, chest pain, abdominal pain, constipation, diarrhea, nausea, vomiting.   ED Course: Vital signs in the ED were stable.  Lab work-up showed CMP with glucose of 123.  CBC within the limits.  BNP 110.  Troponin 21.  Respiratory flu COVID-negative.  CTA results as above prior to presentation (positive for right-sided pulmonary embolus with RV to LV ratio 1.1 to 1).  Patient was started on heparin drip in the ED.  PCCM was consulted per report and deferred her admission given her stable vitals and RV LV ratio.  Review of Systems: As per HPI otherwise all other systems reviewed and are negative.  Past  Medical History:  Diagnosis Date   Acute pulmonary embolism ([name]) 11/11/2018   Acute pulmonary embolism with acute cor pulmonale ([name]) 09/16/2017   Acute superficial venous thrombosis of left lower extremity    Aortic insufficiency    Echocardiogram 08/2019: prob bicuspid AoV, mild to mod AI, mild AS (mean 12 mmHg), EF 55-60, no RWMA, Gr 1 DD, GLS -22.2%, normal RVSF, Ao Root 39 mm   Coronary CTA    Coronary CTA 08/2019: Calcium score 0, no evidence of CAD, ascending aorta 4 cm   Diabetes mellitus without complication ([name])    DM (diabetes mellitus) ([name]) 08/18/2011   Impaired fasting glucose    Mood disorder ([name]) 08/18/2011   Obesity, Class III, BMI 40-49.9 (morbid obesity) ([name]) 09/16/2017   Obstructive sleep apnea on CPAP    Osteoarthritis of knee    Personal history of pulmonary embolism    PTSD (post-traumatic stress disorder) 08/18/2011   Pulmonary emboli ([name]) 09/15/2017   Renal disorder    kideny stones   Restless leg syndrome    Severe recurrent major depression without psychotic features ([name]) 02/17/2018   Sleep apnea 08/18/2011   Thoracic aortic aneurysm    Thoracic aortic aneurysm without rupture     Past Surgical History:  Procedure Laterality Date   arm surgery Bilateral Skin removal   DILATATION & CURETTAGE/HYSTEROSCOPY WITH MYOSURE N/A 06/11/2018   Procedure: DILATATION & CURETTAGE/HYSTEROSCOPY WITH MYOSURE RESECTION OF ENDOMETRIAL THICKENING;  Surgeon: Richardean Chimera, MD;  Location: [facility];  Service:  Gynecology;  Laterality: N/A;  BMI 56   DILATION AND EVACUATION     ESOPHAGOGASTRODUODENOSCOPY (EGD) WITH PROPOFOL N/A 07/15/2018   Procedure: ESOPHAGOGASTRODUODENOSCOPY (EGD) WITH PROPOFOL;  Surgeon: Willis Modena, MD;  Location: [facility];  Service: Endoscopy;  Laterality: N/A;   ESOPHAGOGASTRODUODENOSCOPY (EGD) WITH PROPOFOL N/A 04/27/2019   Procedure: ESOPHAGOGASTRODUODENOSCOPY (EGD) WITH PROPOFOL;  Surgeon: Willis Modena, MD;  Location: [facility];  Service: Endoscopy;   Laterality: N/A;   IR ABLATE LIVER CRYOABLATION  10/28/2019   IR ABLATE LIVER CRYOABLATION  05/07/2020   IR RADIOLOGIST EVAL & MGMT  10/14/2019   IR RADIOLOGIST EVAL & MGMT  04/03/2020   KIDNEY STONE SURGERY     KNEE SURGERY Right    LUMBAR LAMINECTOMY/DECOMPRESSION MICRODISCECTOMY N/A 11/08/2020   Procedure: LUMBAR FIVE AND SACRAL ONE LAMINECTOMY/DECOMPRESSION MICRODISCECTOMY 1 LEVEL;  Surgeon: Venita Lick, MD;  Location: [facility];  Service: Orthopedics;  Laterality: N/A;   TONSILLECTOMY      Social History  reports that she has never smoked. She has never used smokeless tobacco. She reports current alcohol use. She reports that she does not use drugs.  Allergies  Allergen Reactions   Sulfa Antibiotics Hives    Family History  Adopted: Yes  Reviewed on admission  Prior to Admission medications   Medication Sig Start Date End Date Taking? Authorizing Provider  rivaroxaban (XARELTO) 20 MG TABS tablet Take 1 tablet (20 mg total) by mouth daily with supper. 11/12/20  Yes Rolly Salter, MD  BREZTRI AEROSPHERE 160-9-4.8 MCG/ACT AERO Inhale 2 puffs into the lungs 2 (two) times daily as needed (wheezing/shortness of breath). Patient not taking: Reported on 02/18/2021 10/15/20   [provider]  dextromethorphan (DELSYM) 30 MG/5ML liquid Take 15 mg by mouth as needed for cough.    [provider]  furosemide (LASIX) 20 MG tablet Take 1 tablet (20 mg total) by mouth daily as needed for edema (weight gain of >3lbs in 1 day or >5lbs in 2 days.). 11/12/20 11/12/21  Rolly Salter, MD  gabapentin (NEURONTIN) 300 MG capsule Take 300 mg by mouth 3 (three) times daily. 04/01/20   [provider]  guaiFENesin-dextromethorphan (ROBITUSSIN DM) 100-10 MG/5ML syrup Take 5 mLs by mouth every 4 (four) hours as needed for cough.    [provider]  lamoTRIgine (LAMICTAL) 150 MG tablet Take 150 mg by mouth daily after breakfast.  06/30/18   [provider]  metFORMIN  (GLUCOPHAGE) 500 MG tablet Take 500 mg by mouth 2 (two) times daily with a meal.  10/28/13   [provider]  oxymetazoline (AFRIN) 0.05 % nasal spray Place 1 spray into both nostrils 2 (two) times daily as needed for congestion.    [provider]  pseudoephedrine (SUDAFED) 30 MG tablet Take 30 mg by mouth every 4 (four) hours as needed for congestion.    [provider]  rOPINIRole (REQUIP) 4 MG tablet Take 8 mg by mouth at bedtime. 11/03/20   [provider]  TRINTELLIX 20 MG TABS tablet Take 20 mg by mouth daily. 05/13/19   [provider]    Physical Exam: Vitals:   02/18/21 1715 02/18/21 1800 02/18/21 1908 02/18/21 2100  BP: 117/74 116/67  134/74  Pulse: 83 83  69  Resp: (!) 25 18  19   Temp:    98.1 F (36.7 C)  TempSrc:    Oral  SpO2: 93% 94%  93%  Weight:   (!) 140.5 kg   Height:   5\' 5"  (  1.651 m)    Physical Exam Constitutional:      General: She is not in acute distress.    Appearance: Normal appearance. She is obese.  HENT:     Head: Normocephalic and atraumatic.     Mouth/Throat:     Mouth: Mucous membranes are moist.     Pharynx: Oropharynx is clear.  Eyes:     Extraocular Movements: Extraocular movements intact.     Pupils: Pupils are equal, round, and reactive to light.  Cardiovascular:     Rate and Rhythm: Normal rate and regular rhythm.     Pulses: Normal pulses.     Heart sounds: Normal heart sounds.  Pulmonary:     Effort: Pulmonary effort is normal. No respiratory distress.     Breath sounds: Normal breath sounds.  Abdominal:     General: Bowel sounds are normal. There is no distension.     Palpations: Abdomen is soft.     Tenderness: There is no abdominal tenderness.  Musculoskeletal:        General: No swelling or deformity.  Skin:    General: Skin is warm and dry.  Neurological:     General: No focal deficit present.     Mental Status: Mental status is at baseline.   Labs on Admission: I have personally  reviewed following labs and imaging studies  CBC: Recent Labs  Lab 02/18/21 1512 02/18/21 1658  WBC 8.7 8.3  NEUTROABS 6.1 5.7  HGB 12.9 12.3  HCT 39.6 39.6  MCV 87.5 89.2  PLT 248.0 236    Basic Metabolic Panel: Recent Labs  Lab 02/18/21 1512 02/18/21 1658  NA 143 139  K 4.5 4.2  CL 106 106  CO2 27 24  GLUCOSE 123* 103*  BUN 20 21*  CREATININE 1.20 1.09*  CALCIUM 9.7 9.0    GFR: Estimated Creatinine Clearance: 82.2 mL/min (A) (by C-G formula based on SCr of 1.09 mg/dL (H)).  Liver Function Tests: No results for input(s): AST, ALT, ALKPHOS, BILITOT, PROT, ALBUMIN in the last 168 hours.  Urine analysis:    Component Value Date/Time   COLORURINE YELLOW 11/11/2020 1228   APPEARANCEUR CLEAR 11/11/2020 1228   LABSPEC 1.012 11/11/2020 1228   PHURINE 7.0 11/11/2020 1228   GLUCOSEU NEGATIVE 11/11/2020 1228   HGBUR NEGATIVE 11/11/2020 1228   BILIRUBINUR NEGATIVE 11/11/2020 1228   KETONESUR NEGATIVE 11/11/2020 1228   PROTEINUR NEGATIVE 11/11/2020 1228   NITRITE NEGATIVE 11/11/2020 1228   LEUKOCYTESUR NEGATIVE 11/11/2020 1228    Radiological Exams on Admission: CT Angio Chest W/Cm &/Or Wo Cm  Result Date: 02/18/2021 CLINICAL DATA:  Recurrent PE, new onset shortness of breath EXAM: CT ANGIOGRAPHY CHEST WITH CONTRAST TECHNIQUE: Multidetector CT imaging of the chest was performed using the standard protocol during bolus administration of intravenous contrast. Multiplanar CT image reconstructions and MIPs were obtained to evaluate the vascular anatomy. CONTRAST:  OMNIPAQUE IOHEXOL 350 MG/ML SOLN COMPARISON:  01/29/2021 FINDINGS: Cardiovascular: Satisfactory opacification of the pulmonary arteries to the segmental level. There is nearly occlusive, lobar to segmental embolus throughout the right lung (series 4, image 48, 67, 76). No embolus identified in the left lung.Global cardiomegaly. Mildly elevated RV LV ratio, 1.1-1. Gross enlargement of the main pulmonary artery,  measuring up to 4.2 cm in caliber. No pericardial effusion. Mediastinum/Nodes: No enlarged mediastinal, hilar, or axillary lymph nodes. Small hiatal hernia. Thyroid gland, trachea, and esophagus demonstrate no significant findings. Lungs/Pleura: Diffuse mosaic attenuation of the airspaces. No pleural effusion or pneumothorax. Upper  Abdomen: No acute abnormality.  Hepatic steatosis. Musculoskeletal: No chest wall abnormality. No acute or significant osseous findings. Review of the MIP images confirms the above findings. IMPRESSION: 1. There is nearly occlusive, lobar to segmental embolus throughout the right lung. No embolus identified in the left lung. 2. Global cardiomegaly. Mildly elevated RV LV ratio, 1.1-1. Gross enlargement of the main pulmonary artery. Findings are consistent with submassive pulmonary embolus and concerning for right heart strain. Correlate with echocardiographic findings. 3. Diffuse mosaic attenuation of the airspaces, suggestive of small airways disease and or chronic thromboembolic disease. 4. Hepatic steatosis. Call report request was placed at the time of interpretation. Final communication Woods be documented. Electronically Signed   By: Jearld Lesch M.D.   On: 02/18/2021 16:36    EKG: Independently reviewed.  Sinus rhythm with right bundle branch block.  Similar to previous.  Assessment/Plan Principal Problem:   Pulmonary embolus, right ([name]) Active Problems:   DM (diabetes mellitus) ([name])   Sleep apnea   Severe recurrent major depression without psychotic features ([name])  Pulmonary embolus > In the setting of known recurrent pulmonary bliss on chronic anticoagulation, but out of anticoagulation for 5 days 1 week ago due to insurance issues.  Has been back on anticoagulation for 7 days but had worsening shortness of breath and went to pulmonologist office who sent her for CTA which demonstrated right-sided PE. > RV to LV ratio 1.1; 1, elevated pulmonary artery pressures,  otherwise stable vital signs saturating well on room air. - Monitor on telemetry - Continue with heparin drip - Check echocardiogram to further evaluate for signs of strain  Depression PTSD Mood disorder - Continue home Trintellix, Lamictal  Diabetes - SSI  OSA - Continue home CPAP (she brought hers from home)  DVT prophylaxis: Heparin  Code Status:   Full  Family Communication:  None on admission. Disposition Plan:   Patient is from:  Home  Anticipated DC to:  Home  Anticipated DC date:  1 to 2 days  Anticipated DC barriers: None  Consults called:  None  Admission status:  Inpatient, telemetry   Severity of Illness: The appropriate patient status for this patient is INPATIENT. Inpatient status is judged to be reasonable and necessary in order to provide the required intensity of service to ensure the patient's safety. The patient's presenting symptoms, physical exam findings, and initial radiographic and laboratory data in the context of their chronic comorbidities is felt to place them at high risk for further clinical deterioration. Furthermore, it is not anticipated that the patient Woods be medically stable for discharge from the hospital within 2 midnights of admission. The following factors support the patient status of inpatient.   " The patient's presenting symptoms include shortness of breath. " The worrisome physical exam findings include stable physical exam when seen. " The initial radiographic and laboratory data are worrisome because of Lab work-up showed CMP with glucose of 123.  CBC within the limits.  BNP 110.  Troponin 21.  Respiratory flu COVID-negative.  CTA results as above prior to presentation (positive for right-sided pulmonary embolus with RV to LV ratio 1.1 to 1). " The chronic co-morbidities include recurrent PE, diabetes, OSA.   * I certify that at the point of admission it is my clinical judgment that the patient Woods require inpatient hospital care  spanning beyond 2 midnights from the point of admission due to high intensity of service, high risk for further deterioration and high frequency of surveillance required.*  Synetta Fail MD [facility]  How to contact the [facility] Attending or Consulting provider 7A - 7P or covering provider during after hours 7P -7A, for this patient?   Check the care team in [facility] and look for a) attending/consulting [facility] provider listed and b) the [facility] team listed Log into [email or web address] and use [facility] universal password to access. If you do not have the password, please contact the hospital operator. Locate the [facility] provider you are looking for under [facility] and page to a number that you can be directly reached. If you still have difficulty reaching the provider, please page the [facility] (Director on Call) for the Hospitalists listed on amion for assistance.  02/18/2021, 9:34 PM
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
## 2021-02-18 NOTE — Progress Notes (Signed)
ANTICOAGULATION CONSULT NOTE  Pharmacy Consult for heparin Indication: acute pulmonary embolus  Heparin Dosing Weight: 92kg  Labs: Recent Labs    02/18/21 1512 02/18/21 1658  HGB 12.9 12.3  HCT 39.6 39.6  PLT 248.0 236  CREATININE 1.20  --     Assessment: 19 yof with hx recurrent PE (most recently reported 10/2020) on lifelong anticoagulation with Xarelto presenting with increased SOB and found to have recurrent PE concerning for RHS. Patient reports she missed 5 days of Xarelto PTA recently to insurance formulary issues, now back on therapy for 1 week now (last dose reported 10/10 at 0630). Pharmacy consulted to start heparin. CBC wnl, SCr 1.2 on presentation. No active bleed issues reported.  Goal of Therapy:  Heparin level 0.3-0.7 units/ml aPTT 66-102 seconds Monitor platelets by anticoagulation protocol: Yes   Plan:  Hold Xarelto Heparin 5500 unit bolus - will give bolus due to new near-occlusive PE with RHS, lower end of dosing range with Xarelto dose taken this AM Start heparin at 1650 units/h 6h aPTT Monitor daily heparin level and aPTT until correlating, CBC, s/sx bleeding   Leia Alf, PharmD, BCPS Clinical Pharmacist 02/18/2021 5:21 PM

## 2021-02-18 NOTE — Assessment & Plan Note (Addendum)
Patient with recurrent PE on lifelong anticoagulation Patient with new onset shortness of breath, decreased activity tolerance and exertional hypoxemia suspicious for recurrent PE as she had a break in therapy for 5 days.  We will set patient up for a stat CTA PE protocol Stat labs with CBC and bmet .  Patient has restarted her anticoagulation therapy has been on back on Xarelto for 1 week.   Patient is stable in the office with O2 saturations normal on room air at rest.  We will begin oxygen with activity at 2 L.  She was given a home concentrator to take home from our office.  With O2 saturations greater than 90% on 2 L with walking.  DME company has been contacted to begin oxygen therapy at home. Pending CT results.  If positive and no significant right heart strain noted will continue on Xarelto and follow in the outpatient setting if able Will have close follow-up in 1 week, sooner if positive PE We will have her hold metformin as patient is a diabetic and has had recent CT chest with contrast bmet  is pending today Patient is advised to seek emergency room care if develops syncope, hemoptysis, chest pain, palpitations, dizziness  Plan  Patient Instructions  Stat CT chest today  Labs today .  Hold Metformin x 2 days then resume Begin Oxygen 2l/m with activity and At bedtime  . Goal is O2 sats >88-90% Activity as tolerated.  Follow in 1 week and As needed   Please contact office for sooner follow up if symptoms do not improve or worsen or seek emergency care

## 2021-02-18 NOTE — Patient Instructions (Addendum)
Stat CT chest today  Labs today .  Hold Metformin x 2 days then resume Begin Oxygen 2l/m with activity and At bedtime  . Goal is O2 sats >88-90% Activity as tolerated.  Follow in 1 week and As needed   Please contact office for sooner follow up if symptoms do not improve or worsen or seek emergency care

## 2021-02-19 ENCOUNTER — Inpatient Hospital Stay (HOSPITAL_COMMUNITY): Payer: Commercial Managed Care - PPO

## 2021-02-19 DIAGNOSIS — I2602 Saddle embolus of pulmonary artery with acute cor pulmonale: Secondary | ICD-10-CM

## 2021-02-19 LAB — CBC
HCT: 38.8 % (ref 36.0–46.0)
Hemoglobin: 12.1 g/dL (ref 12.0–15.0)
MCH: 28.2 pg (ref 26.0–34.0)
MCHC: 31.2 g/dL (ref 30.0–36.0)
MCV: 90.4 fL (ref 80.0–100.0)
Platelets: 224 K/uL (ref 150–400)
RBC: 4.29 MIL/uL (ref 3.87–5.11)
RDW: 14.5 % (ref 11.5–15.5)
WBC: 7.5 K/uL (ref 4.0–10.5)
nRBC: 0 % (ref 0.0–0.2)

## 2021-02-19 LAB — BASIC METABOLIC PANEL
Anion gap: 9 (ref 5–15)
BUN: 15 mg/dL (ref 6–20)
CO2: 25 mmol/L (ref 22–32)
Calcium: 9.3 mg/dL (ref 8.9–10.3)
Chloride: 107 mmol/L (ref 98–111)
Creatinine, Ser: 1.04 mg/dL — ABNORMAL HIGH (ref 0.44–1.00)
GFR, Estimated: >60 mL/min (ref >60)
Glucose, Bld: 94 mg/dL (ref 70–99)
Potassium: 4.1 mmol/L (ref 3.5–5.1)
Sodium: 141 mmol/L (ref 135–145)

## 2021-02-19 LAB — ECHOCARDIOGRAM COMPLETE
AR max vel: 1.53 cm2
AV Area VTI: 1.54 cm2
AV Area mean vel: 1.46 cm2
AV Mean grad: 14 mmHg
AV Peak grad: 24.4 mmHg
Ao pk vel: 2.47 m/s
Area-P 1/2: 4.19 cm2
Height: 65 in
S' Lateral: 3.2 cm
Weight: 4903.03 oz

## 2021-02-19 LAB — HIV ANTIBODY (ROUTINE TESTING W REFLEX): HIV Screen 4th Generation wRfx: NONREACTIVE

## 2021-02-19 LAB — GLUCOSE, CAPILLARY
Glucose-Capillary: 100 mg/dL — ABNORMAL HIGH (ref 70–99)
Glucose-Capillary: 116 mg/dL — ABNORMAL HIGH (ref 70–99)
Glucose-Capillary: 82 mg/dL (ref 70–99)
Glucose-Capillary: 120 mg/dL — ABNORMAL HIGH (ref 70–99)

## 2021-02-19 LAB — APTT: aPTT: 62 s — ABNORMAL HIGH (ref 24–36)

## 2021-02-19 LAB — HEPARIN LEVEL (UNFRACTIONATED): Heparin Unfractionated: 0.44 [IU]/mL (ref 0.30–0.70)

## 2021-02-19 MED ORDER — INFLUENZA VAC SPLIT QUAD 0.5 ML IM SUSY
0.5000 mL | PREFILLED_SYRINGE | INTRAMUSCULAR | Status: AC
  Administered 2021-02-19: 0.5 mL via INTRAMUSCULAR
  Filled 2021-02-19: qty 0.5

## 2021-02-19 MED ORDER — CLONIDINE HCL 0.1 MG PO TABS
0.3000 mg | ORAL_TABLET | Freq: Every day | ORAL | Status: DC
  Administered 2021-02-19 – 2021-02-20 (×2): 0.3 mg via ORAL
  Filled 2021-02-19 (×2): qty 3

## 2021-02-19 MED ORDER — ENOXAPARIN SODIUM 150 MG/ML IJ SOSY
140.0000 mg | PREFILLED_SYRINGE | Freq: Two times a day (BID) | INTRAMUSCULAR | Status: AC
Start: 2021-02-19 — End: 2021-02-20
  Administered 2021-02-19 – 2021-02-20 (×3): 140 mg via SUBCUTANEOUS
  Filled 2021-02-19 (×4): qty 0.94

## 2021-02-19 MED ORDER — TRAMADOL HCL 50 MG PO TABS
50.0000 mg | ORAL_TABLET | Freq: Once | ORAL | Status: AC
  Administered 2021-02-19: 50 mg via ORAL
  Filled 2021-02-19: qty 1

## 2021-02-19 MED ORDER — BUTALBITAL-APAP-CAFFEINE 50-325-40 MG PO TABS
1.0000 | ORAL_TABLET | Freq: Four times a day (QID) | ORAL | Status: AC | PRN
Start: 2021-02-19 — End: 2021-02-19
  Administered 2021-02-19 (×3): 1 via ORAL
  Filled 2021-02-19 (×3): qty 1

## 2021-02-19 NOTE — Progress Notes (Signed)
PROGRESS NOTE                                                                                                                                                                                                             Patient Demographics:    Erika Woods, is a 55 y.o. female, DOB - Dec 06, 1965, UMP:[id number]  Outpatient Primary MD for the patient is Dois Davenport, MD    LOS - 1  Admit date - 02/18/2021    Chief Complaint  Patient presents with   Pulmonary Embolism       Brief Narrative (HPI from H&P)   Erika Woods is a 55 y.o. female with medical history significant of recurrent pulmonary embolism, diabetes, degenerative disc disease, depression, OSA, thoracic aortic aneurysm presenting with ongoing shortness of breath, apparently she was off of her chronic Xarelto for 5 days due to insurance issues, in the ER she was diagnosed with large PE on CTA and admitted to the hospital.   Subjective:    Erika Woods today has, No headache, No chest pain, No abdominal pain - No Nausea, No new weakness tingling or numbness, no SOB.   Assessment  & Plan :     Acute Hypoxic Resp. Failure due to Acute large right lung PE - large burden on CTA -thankfully she is hemodynamically stable and her troponins unrelentingly unremarkable, she has received almost 12 hrs of IV Heparin with switch her to full dose Lovenox, note patient was due to discussion in Xarelto home regimen for about 5 days due to insurance issues.  This is not Xarelto failure.  We will switch her to oral Xarelto within the next 24 to 48 hours based on her clinical progress, follow-up echocardiogram.  Note patient has had 2 previous PEs.  Will require lifelong anticoagulation.  SpO2: 94 % O2 Flow Rate (L/min): 1.5 L/min    2.  Morbid Obesity with OSA - BMI 50, follow with PCP, CPAP QHS.   3.  History of depression, PTSD and mood disorder.  Home medications which  Lamictal and Trintellix will be continued.  4.  DM2 - ISS  Lab Results  Component Value Date   HGBA1C 6.2 (H) 11/08/2020   CBG (last 3)  Recent Labs    02/18/21 2255 02/19/21 0826  GLUCAP 160* 100*  Condition - Fair  Family Communication  :  None present  Code Status :  Full  Consults  :  None  PUD Prophylaxis :    Procedures  :     CTA - 1. There is nearly occlusive, lobar to segmental embolus throughout the right lung. No embolus identified in the left lung. 2. Global cardiomegaly. Mildly elevated RV LV ratio, 1.1-1. Gross enlargement of the main pulmonary artery. Findings are consistent with submassive pulmonary embolus and concerning for right heart strain. Correlate with echocardiographic findings. 3. Diffuse mosaic attenuation of the airspaces, suggestive of small airways disease and or chronic thromboembolic disease. 4. Hepatic steatosis.     TTE      Disposition Plan  :    Status is: Inpatient  Remains inpatient appropriate because:IV treatments appropriate due to intensity of illness or inability to take PO  Dispo: The patient is from: Home              Anticipated d/c is to: Home              Patient currently is not medically stable to d/c.   Difficult to place patient No      DVT Prophylaxis  :  Lovenox    Lab Results  Component Value Date   PLT 224 02/19/2021    Diet :  Diet Order             Diet heart healthy/carb modified Room service appropriate? Yes; Fluid consistency: Thin  Diet effective now                    Inpatient Medications  Scheduled Meds:  enoxaparin (LOVENOX) injection  140 mg Subcutaneous Q12H   gabapentin  300 mg Oral TID   influenza vac split quadrivalent PF  0.5 mL Intramuscular Tomorrow-1000   insulin aspart  0-15 Units Subcutaneous TID WC   lamoTRIgine  150 mg Oral QPC breakfast   rOPINIRole  8 mg Oral QHS   sodium chloride flush  3 mL Intravenous Q12H   vortioxetine HBr  20 mg Oral Daily    Continuous Infusions: PRN Meds:.acetaminophen **OR** acetaminophen, butalbital-acetaminophen-caffeine, diphenhydrAMINE, polyethylene glycol  Antibiotics  :    Anti-infectives (From admission, onward)    None        Time Spent in minutes  30   Susa Raring M.D on 02/19/2021 at 9:27 AM  To page go to [email or web address]   [facility] -  Office  [phone]  See all Orders from today for further details    Objective:   Vitals:   02/18/21 2335 02/19/21 0500 02/19/21 0549 02/19/21 0821  BP:   (!) 151/75 (!) 147/79  Pulse: 73  69 68  Resp: 18  14 13   Temp:   97.9 F (36.6 C) 98.6 F (37 C)  TempSrc:   Oral Oral  SpO2: 94%  95% 94%  Weight:  (!) 139 kg    Height:        Wt Readings from Last 3 Encounters:  02/19/21 (!) 139 kg  02/18/21 (!) 140.5 kg  01/30/21 (!) 141.7 kg     Intake/Output Summary (Last 24 hours) at 02/19/2021 0927 Last data filed at 02/19/2021 0400 Gross per 24 hour  Intake 410.44 ml  Output 1200 ml  Net -789.56 ml     Physical Exam  Awake Alert, No new F.N deficits, Normal affect .AT,PERRAL Supple Neck, No JVD,   Symmetrical Chest wall movement, Good air movement bilaterally, CTAB  RRR,No Gallops,Rubs or new Murmurs, No Parasternal Heave +ve B.Sounds, Abd Soft, No tenderness,   No Cyanosis, Clubbing or edema,        Data Review:    CBC Recent Labs  Lab 02/18/21 1512 02/18/21 1658 02/19/21 0022  WBC 8.7 8.3 7.5  HGB 12.9 12.3 12.1  HCT 39.6 39.6 38.8  PLT 248.0 236 224  MCV 87.5 89.2 90.4  MCH  --  27.7 28.2  MCHC 32.5 31.1 31.2  RDW 15.3 14.3 14.5  LYMPHSABS 1.8 1.8  --   MONOABS 0.5 0.4  --   EOSABS 0.3 0.3  --   BASOSABS 0.1 0.1  --     Recent Labs  Lab 02/18/21 1512 02/18/21 1658 02/19/21 0022  NA 143 139 141  K 4.5 4.2 4.1  CL 106 106 107  CO2 27 24 25   GLUCOSE 123* 103* 94  BUN 20 21* 15  CREATININE 1.20 1.09* 1.04*  CALCIUM 9.7 9.0 9.3  BNP  --  110.5*  --      ------------------------------------------------------------------------------------------------------------------ No results for input(s): CHOL, HDL, LDLCALC, TRIG, CHOLHDL, LDLDIRECT in the last 72 hours.  Lab Results  Component Value Date   HGBA1C 6.2 (H) 11/08/2020   ------------------------------------------------------------------------------------------------------------------ No results for input(s): TSH, T4TOTAL, T3FREE, THYROIDAB in the last 72 hours.  Invalid input(s): FREET3  Cardiac Enzymes No results for input(s): CKMB, TROPONINI, MYOGLOBIN in the last 168 hours.  Invalid input(s): CK ------------------------------------------------------------------------------------------------------------------    Component Value Date/Time   BNP 110.5 (H) 02/18/2021 1658    Radiology Reports DG Chest 2 View  Result Date: 01/29/2021 CLINICAL DATA:  Shortness of breath and fatigue. EXAM: CHEST - 2 VIEW COMPARISON:  Radiograph and chest CTA 12/16/2020 at [facility]. FINDINGS: The cardiomediastinal contours are stable. Occasional areas of atelectasis, left greater than right. Pulmonary vasculature is normal. No consolidation, pleural effusion, or pneumothorax. No acute osseous abnormalities are seen. IMPRESSION: Occasional areas of atelectasis, left greater than right. Electronically Signed   By: [facility] M.D.   On: 01/29/2021 19:02   CT Angio Chest W/Cm &/Or Wo Cm  Result Date: 02/18/2021 CLINICAL DATA:  Recurrent PE, new onset shortness of breath EXAM: CT ANGIOGRAPHY CHEST WITH CONTRAST TECHNIQUE: Multidetector CT imaging of the chest was performed using the standard protocol during bolus administration of intravenous contrast. Multiplanar CT image reconstructions and MIPs were obtained to evaluate the vascular anatomy. CONTRAST:  04/20/2021 OMNIPAQUE IOHEXOL 350 MG/ML SOLN COMPARISON:  01/29/2021 FINDINGS: Cardiovascular: Satisfactory opacification of the pulmonary arteries to  the segmental level. There is nearly occlusive, lobar to segmental embolus throughout the right lung (series 4, image 48, 67, 76). No embolus identified in the left lung.Global cardiomegaly. Mildly elevated RV LV ratio, 1.1-1. Gross enlargement of the main pulmonary artery, measuring up to 4.2 cm in caliber. No pericardial effusion. Mediastinum/Nodes: No enlarged mediastinal, hilar, or axillary lymph nodes. Small hiatal hernia. Thyroid gland, trachea, and esophagus demonstrate no significant findings. Lungs/Pleura: Diffuse mosaic attenuation of the airspaces. No pleural effusion or pneumothorax. Upper Abdomen: No acute abnormality.  Hepatic steatosis. Musculoskeletal: No chest wall abnormality. No acute or significant osseous findings. Review of the MIP images confirms the above findings. IMPRESSION: 1. There is nearly occlusive, lobar to segmental embolus throughout the right lung. No embolus identified in the left lung. 2. Global cardiomegaly. Mildly elevated RV LV ratio, 1.1-1. Gross enlargement of the main pulmonary artery. Findings are consistent with submassive pulmonary embolus and concerning for right heart strain. Correlate with echocardiographic findings.  3. Diffuse mosaic attenuation of the airspaces, suggestive of small airways disease and or chronic thromboembolic disease. 4. Hepatic steatosis. Call report request was placed at the time of interpretation. Final communication will be documented. Electronically Signed   By: Jearld Lesch M.D.   On: 02/18/2021 16:36   CT Angio Chest PE W and/or Wo Contrast  Result Date: 01/29/2021 CLINICAL DATA:  Shortness of breath for 3 days, history of prior COVID-19 infection as well as pulmonary embolus EXAM: CT ANGIOGRAPHY CHEST WITH CONTRAST TECHNIQUE: Multidetector CT imaging of the chest was performed using the standard protocol during bolus administration of intravenous contrast. Multiplanar CT image reconstructions and MIPs were obtained to evaluate the  vascular anatomy. CONTRAST:  43mL OMNIPAQUE IOHEXOL 350 MG/ML SOLN COMPARISON:  12/16/2020 FINDINGS: Cardiovascular: Thoracic aorta again demonstrates dilatation to 4.2 cm in the ascending aorta. Normal tapering in the thoracic aortic arch is noted. Descending thoracic aorta is within normal limits. The pulmonary artery is well visualized with a normal branching pattern bilaterally. Previously seen residual right lower lobe thrombus has resolved in the interval from the prior exam. No new pulmonary emboli are noted. Mediastinum/Nodes: Thoracic inlet is within normal limits. No sizable hilar or mediastinal adenopathy is noted. The esophagus as visualized is within normal limits. Lungs/Pleura: Lungs are well aerated bilaterally. No focal infiltrate or sizable effusion is seen. No parenchymal nodules are noted. Previously seen left basilar atelectasis has resolved. Upper Abdomen: Visualized upper abdomen again shows fatty infiltration of the liver. Musculoskeletal: Degenerative changes of the thoracic spine are noted. Review of the MIP images confirms the above findings. IMPRESSION: Resolution of previously seen residual pulmonary emboli within the right pulmonary artery. No new pulmonary emboli are seen. Persistent dilatation of the ascending aorta to 4.2 cm. Recommend annual imaging followup by CTA or MRA. This recommendation follows 2010 ACCF/AHA/AATS/ACR/ASA/SCA/SCAI/SIR/STS/SVM Guidelines for the Diagnosis and Management of Patients with Thoracic Aortic Disease. Circulation. 2010; 121: V893-Y101. Aortic aneurysm NOS (ICD10-I71.9) Fatty liver Electronically Signed   By: Alcide Clever M.D.   On: 01/29/2021 19:57

## 2021-02-19 NOTE — Progress Notes (Signed)
ANTICOAGULATION CONSULT NOTE  Pharmacy Consult for Heparin>>Lovenox Indication: acute pulmonary embolus  Heparin Dosing Weight: 92kg  Labs: Recent Labs    02/18/21 1512 02/18/21 1658 02/19/21 0022  HGB 12.9 12.3 12.1  HCT 39.6 39.6 38.8  PLT 248.0 236 224  APTT  --   --  62*  HEPARINUNFRC  --   --  0.44  CREATININE 1.20 1.09* 1.04*     Assessment: 18 yof with hx recurrent PE (most recently reported 10/2020) on lifelong anticoagulation with Xarelto presenting with increased SOB and found to have recurrent PE concerning for RHS. Patient reports she missed 5 days of Xarelto PTA recently to insurance formulary issues, now back on therapy for 1 week now (last dose reported 10/10 at 0630). Pharmacy consulted to start heparin. CBC wnl, SCr 1.2 on presentation. No active bleed issues reported.  D/w Dr Thedore Mins and we will transition her to SQ Lovenox for now before going back to oral anticoagulation.   Goal of Therapy:  Anti-Xa 0.6-1.2 Monitor platelets by anticoagulation protocol: Yes   Plan:  Dc heparin Lovenox 140mg  SQ BID F/u with resume oral anticoagulation  , PharmD, BCIDP, AAHIVP, CPP Infectious Disease Pharmacist 02/19/2021 9:18 AM

## 2021-02-19 NOTE — Telephone Encounter (Signed)
Nothing on message.  Patient currently in patient at hospital at direction from NP Parrett.    Will close encounter at this time. (Probably a duplicate message)

## 2021-02-19 NOTE — Progress Notes (Signed)
ANTICOAGULATION CONSULT NOTE  Pharmacy Consult for Heparin Indication: acute pulmonary embolus  Heparin Dosing Weight: 92kg  Labs: Recent Labs    02/18/21 1512 02/18/21 1658 02/19/21 0022  HGB 12.9 12.3 12.1  HCT 39.6 39.6 38.8  PLT 248.0 236 224  APTT  --   --  62*  HEPARINUNFRC  --   --  0.44  CREATININE 1.20 1.09* 1.04*     Assessment: 35 yof with hx recurrent PE (most recently reported 10/2020) on lifelong anticoagulation with Xarelto presenting with increased SOB and found to have recurrent PE concerning for RHS. Patient reports she missed 5 days of Xarelto PTA recently to insurance formulary issues, now back on therapy for 1 week now (last dose reported 10/10 at 0630). Pharmacy consulted to start heparin. CBC wnl, SCr 1.2 on presentation. No active bleed issues reported.  10/11 AM update:  aPTT just below goal Should be able to start using heparin level only 10/12  Goal of Therapy:  Heparin level 0.3-0.7 units/ml aPTT 66-102 seconds Monitor platelets by anticoagulation protocol: Yes   Plan:  Inc heparin to 1800 units/hr Re-check heparin level and aPTT in 8 hours Monitor daily heparin level and aPTT until correlating, CBC, s/sx bleeding   Abran Duke, PharmD, BCPS Clinical Pharmacist Phone: [phone]

## 2021-02-19 NOTE — Progress Notes (Signed)
PT Cancellation Note  Patient Details Name: Erika Woods MRN: [id number] DOB: 1965-05-14   Cancelled Treatment:    Reason Eval/Treat Not Completed: Medical issues which prohibited therapy Pt with large new PE with heparin started 1757 yesterday.  Per guidelines, needs heparin 24 hr prior to initiation of therapy eval.  Will f/u later date. Anise Salvo, PT Acute Rehab Services Pager [phone] [facility] [phone]   Rayetta Humphrey 02/19/2021, 12:54 PM

## 2021-02-19 NOTE — Progress Notes (Signed)
  Echocardiogram 2D Echocardiogram has been performed.  Roosvelt Maser F 02/19/2021, 2:10 PM

## 2021-02-20 ENCOUNTER — Other Ambulatory Visit (HOSPITAL_COMMUNITY): Payer: Self-pay

## 2021-02-20 LAB — COMPREHENSIVE METABOLIC PANEL
ALT: 24 U/L (ref 0–44)
AST: 23 U/L (ref 15–41)
Albumin: 3.3 g/dL — ABNORMAL LOW (ref 3.5–5.0)
Alkaline Phosphatase: 77 U/L (ref 38–126)
Anion gap: 7 (ref 5–15)
BUN: 13 mg/dL (ref 6–20)
CO2: 26 mmol/L (ref 22–32)
Calcium: 8.9 mg/dL (ref 8.9–10.3)
Chloride: 106 mmol/L (ref 98–111)
Creatinine, Ser: 1.01 mg/dL — ABNORMAL HIGH (ref 0.44–1.00)
GFR, Estimated: >60 mL/min (ref >60)
Glucose, Bld: 104 mg/dL — ABNORMAL HIGH (ref 70–99)
Potassium: 4.2 mmol/L (ref 3.5–5.1)
Sodium: 139 mmol/L (ref 135–145)
Total Bilirubin: 0.8 mg/dL (ref 0.3–1.2)
Total Protein: 5.9 g/dL — ABNORMAL LOW (ref 6.5–8.1)

## 2021-02-20 LAB — GLUCOSE, CAPILLARY
Glucose-Capillary: 159 mg/dL — ABNORMAL HIGH (ref 70–99)
Glucose-Capillary: 74 mg/dL (ref 70–99)
Glucose-Capillary: 109 mg/dL — ABNORMAL HIGH (ref 70–99)
Glucose-Capillary: 115 mg/dL — ABNORMAL HIGH (ref 70–99)

## 2021-02-20 LAB — CBC
HCT: 37.5 % (ref 36.0–46.0)
Hemoglobin: 11.5 g/dL — ABNORMAL LOW (ref 12.0–15.0)
MCH: 27.9 pg (ref 26.0–34.0)
MCHC: 30.7 g/dL (ref 30.0–36.0)
MCV: 91 fL (ref 80.0–100.0)
Platelets: 210 10*3/uL (ref 150–400)
RBC: 4.12 MIL/uL (ref 3.87–5.11)
RDW: 14.4 % (ref 11.5–15.5)
WBC: 5.4 10*3/uL (ref 4.0–10.5)
nRBC: 0 % (ref 0.0–0.2)

## 2021-02-20 LAB — BRAIN NATRIURETIC PEPTIDE: B Natriuretic Peptide: 77.9 pg/mL (ref 0.0–100.0)

## 2021-02-20 LAB — MAGNESIUM: Magnesium: 2.1 mg/dL (ref 1.7–2.4)

## 2021-02-20 MED ORDER — RIVAROXABAN 15 MG PO TABS
15.0000 mg | ORAL_TABLET | Freq: Two times a day (BID) | ORAL | Status: DC
  Administered 2021-02-20 – 2021-02-21 (×2): 15 mg via ORAL
  Filled 2021-02-20 (×2): qty 1

## 2021-02-20 MED ORDER — RIVAROXABAN 20 MG PO TABS: 20.0000 mg | ORAL_TABLET | Freq: Every day | ORAL | Status: DC

## 2021-02-20 NOTE — Progress Notes (Signed)
SATURATION QUALIFICATIONS: (This note is used to comply with regulatory documentation for home oxygen)  Patient Saturations on Room Air at Rest = 100%  Patient Saturations on Room Air while Ambulating = 98%  Patient Saturations on -- Liters of oxygen while Ambulating = NA oxygen not needed    Jerolyn Center, PT Pager [phone]

## 2021-02-20 NOTE — Progress Notes (Signed)
Spoke with patient regarding her Xerelto prescription. She states the issue with the insurance has been resolved and she realizes how important this medicine is.

## 2021-02-20 NOTE — Evaluation (Signed)
Physical Therapy Evaluation and Discharge Patient Details Name: Erika Woods MRN: [id number] DOB: 28-Nov-1965 Today's Date: 02/20/2021  History of Present Illness  55 y.o. female presenting with ongoing shortness of breath 02/18/21. +recurrent large PE (had been off anticoagulation x 5 days)  PMH significant of recurrent pulmonary embolism, diabetes, degenerative disc disease, depression, OSA, thoracic aortic aneurysm  Clinical Impression   Patient evaluated by Physical Therapy with no further acute PT needs identified. Patient ambulated 350 ft independently on room air with sats 98%. PT is signing off. Thank you for this referral.        Recommendations for follow up therapy are one component of a multi-disciplinary discharge planning process, led by the attending physician.  Recommendations may be updated based on patient status, additional functional criteria and insurance authorization.  Follow Up Recommendations No PT follow up    Equipment Recommendations  None recommended by PT    Recommendations for Other Services       Precautions / Restrictions Precautions Precautions: None      Mobility  Bed Mobility               General bed mobility comments: up in chair    Transfers Overall transfer level: Independent                  Ambulation/Gait Ambulation/Gait assistance: Independent Gait Distance (Feet): 300 Feet Assistive device: None Gait Pattern/deviations: Antalgic     General Gait Details: pt reports she needs a knee replacement and limps due to knee pain  Stairs            Wheelchair Mobility    Modified Rankin (Stroke Patients Only)       Balance Overall balance assessment: Independent                                           Pertinent Vitals/Pain Pain Assessment: No/denies pain    Home Living Family/patient expects to be discharged to:: Private residence                 Additional Comments: pt  independent with mobility; further assessment deferred    Prior Function Level of Independence: Independent               Hand Dominance   Dominant Hand: Right    Extremity/Trunk Assessment   Upper Extremity Assessment Upper Extremity Assessment: Overall WFL for tasks assessed    Lower Extremity Assessment Lower Extremity Assessment: Overall WFL for tasks assessed    Cervical / Trunk Assessment Cervical / Trunk Assessment: Other exceptions Cervical / Trunk Exceptions: overweight  Communication   Communication: No difficulties  Cognition Arousal/Alertness: Awake/alert Behavior During Therapy: WFL for tasks assessed/performed Overall Cognitive Status: Within Functional Limits for tasks assessed                                        General Comments General comments (skin integrity, edema, etc.): on room air; with ambulation sats 98% RR 38 bpm able to lower RR when instructed not to talk while walking    Exercises     Assessment/Plan    PT Assessment Patent does not need any further PT services  PT Problem List         PT Treatment Interventions  PT Goals (Current goals can be found in the Care Plan section)  Acute Rehab PT Goals Patient Stated Goal: go home PT Goal Formulation: All assessment and education complete, DC therapy    Frequency     Barriers to discharge        Co-evaluation               AM-PAC PT "6 Clicks" Mobility  Outcome Measure Help needed turning from your back to your side while in a flat bed without using bedrails?: None Help needed moving from lying on your back to sitting on the side of a flat bed without using bedrails?: None Help needed moving to and from a bed to a chair (including a wheelchair)?: None Help needed standing up from a chair using your arms (e.g., wheelchair or bedside chair)?: None Help needed to walk in hospital room?: None Help needed climbing 3-5 steps with a railing? : None 6  Click Score: 24    End of Session   Activity Tolerance: Patient tolerated treatment well Patient left: in chair;with call bell/phone within reach Nurse Communication: Mobility status;Other (comment) (O2 sat walking test done) PT Visit Diagnosis: Difficulty in walking, not elsewhere classified (R26.2)    Time: 8413-2440 PT Time Calculation (min) (ACUTE ONLY): 13 min   Charges:   PT Evaluation $PT Eval Low Complexity: 1 Low           Jerolyn Center, PT Pager [phone]   Zena Amos 02/20/2021, 10:50 AM

## 2021-02-20 NOTE — Discharge Instructions (Addendum)
Follow with Primary MD Dois Davenport, MD in 7 days   Get CBC, CMP, 2 view Chest X ray -  checked next visit within 1 week by Primary MD   Activity: As tolerated with Full fall precautions use walker/cane & assistance as needed  Disposition Home    Diet: Heart Healthy - Low Carb  Special Instructions: If you have smoked or chewed Tobacco  in the last 2 yrs please stop smoking, stop any regular Alcohol  and or any Recreational drug use.  On your next visit with your primary care physician please Get Medicines reviewed and adjusted.  Please request your Prim.MD to go over all Hospital Tests and Procedure/Radiological results at the follow up, please get all Hospital records sent to your Prim MD by signing hospital release before you go home.  If you experience worsening of your admission symptoms, develop shortness of breath, life threatening emergency, suicidal or homicidal thoughts you must seek medical attention immediately by calling 911 or calling your MD immediately  if symptoms less severe.  You Must read complete instructions/literature along with all the possible adverse reactions/side effects for all the Medicines you take and that have been prescribed to you. Take any new Medicines after you have completely understood and accpet all the possible adverse reactions/side effects.         Information on my medicine - XARELTO (rivaroxaban)  This medication education was reviewed with me or my healthcare representative as part of my discharge preparation.  The pharmacist that spoke with me during my hospital stay was:    WHY WAS XARELTO PRESCRIBED FOR YOU? Xarelto was prescribed to treat blood clots that may have been found in the veins of your legs (deep vein thrombosis) or in your lungs (pulmonary embolism) and to reduce the risk of them occurring again.  What do you need to know about Xarelto? The starting dose is one 15 mg tablet taken TWICE daily with food for the FIRST  21 DAYS then on (enter date)  03/13/21  the dose is changed to one 20 mg tablet taken ONCE A DAY with your evening meal.  DO NOT stop taking Xarelto without talking to the health care provider who prescribed the medication.  Refill your prescription for 20 mg tablets before you run out.  After discharge, you should have regular check-up appointments with your healthcare provider that is prescribing your Xarelto.  In the future your dose may need to be changed if your kidney function changes by a significant amount.  What do you do if you miss a dose? If you are taking Xarelto TWICE DAILY and you miss a dose, take it as soon as you remember. You may take two 15 mg tablets (total 30 mg) at the same time then resume your regularly scheduled 15 mg twice daily the next day.  If you are taking Xarelto ONCE DAILY and you miss a dose, take it as soon as you remember on the same day then continue your regularly scheduled once daily regimen the next day. Do not take two doses of Xarelto at the same time.   Important Safety Information Xarelto is a blood thinner medicine that can cause bleeding. You should call your healthcare provider right away if you experience any of the following: Bleeding from an injury or your nose that does not stop. Unusual colored urine (red or dark brown) or unusual colored stools (red or black). Unusual bruising for unknown reasons. A serious fall or if you  hit your head (even if there is no bleeding).  Some medicines may interact with Xarelto and might increase your risk of bleeding while on Xarelto. To help avoid this, consult your healthcare provider or pharmacist prior to using any new prescription or non-prescription medications, including herbals, vitamins, non-steroidal anti-inflammatory drugs (NSAIDs) and supplements.  This website has more information on Xarelto: [email or web address].com.br.

## 2021-02-20 NOTE — Progress Notes (Signed)
PROGRESS NOTE                                                                                                                                                                                                             Patient Demographics:    Erika Woods, is a 55 y.o. female, DOB - 1965/11/14, WER:[id number]  Outpatient Primary MD for the patient is Dois Davenport, MD    LOS - 2  Admit date - 02/18/2021    Chief Complaint  Patient presents with   Pulmonary Embolism       Brief Narrative (HPI from H&P)   Erika Woods is a 55 y.o. female with medical history significant of recurrent pulmonary embolism, diabetes, degenerative disc disease, depression, OSA, thoracic aortic aneurysm presenting with ongoing shortness of breath, apparently she was off of her chronic Xarelto for 5 days due to insurance issues, in the ER she was diagnosed with large PE on CTA and admitted to the hospital.   Subjective:   Patient in bed, appears comfortable, denies any headache, no fever, no chest pain or pressure, no shortness of breath , no abdominal pain. No new focal weakness.    Assessment  & Plan :     Acute Hypoxic Resp. Failure due to Acute large right lung PE with RV starin on CTA - large burden on CTA - thankfully she is hemodynamically stable and her troponins unrelentingly unremarkable, she has received almost 12 hrs of IV Heparin with switch her to full dose Lovenox, note patient was due to discussion in Xarelto home regimen for about 5 days due to insurance issues.  This is not Xarelto failure.  We will switch her to oral Xarelto this evening, if tolerates well and no new symptoms DC on 02/21/21.  Note patient has had 3 previous PEs, this is the 4th PE.  Will require lifelong anticoagulation. TTE non acute.   2.  Morbid Obesity with OSA - BMI 50, follow with PCP, CPAP QHS.   3.  History of depression, PTSD and mood disorder.   Home medications which Lamictal and Trintellix will be continued.  4.  DM2 - ISS  Lab Results  Component Value Date   HGBA1C 6.2 (H) 11/08/2020   CBG (last 3)  Recent Labs    02/19/21 1614 02/19/21 2047 02/20/21 0840  GLUCAP  82 120* 159*          Condition - Fair  Family Communication  :  None present  Code Status :  Full  Consults  :  None  PUD Prophylaxis :    Procedures  :     CTA - 1. There is nearly occlusive, lobar to segmental embolus throughout the right lung. No embolus identified in the left lung. 2. Global cardiomegaly. Mildly elevated RV LV ratio, 1.1-1. Gross enlargement of the main pulmonary artery. Findings are consistent with submassive pulmonary embolus and concerning for right heart strain. Correlate with echocardiographic findings. 3. Diffuse mosaic attenuation of the airspaces, suggestive of small airways disease and or chronic thromboembolic disease. 4. Hepatic steatosis.     TTE -  1. Left ventricular ejection fraction, by estimation, is 60%. The left ventricle has normal function. The left ventricle has no regional wall motion abnormalities. Left ventricular diastolic parameters were normal.  2. Right ventricular systolic function is normal. The right ventricular size is mildly enlarged. There is mildly elevated pulmonary artery systolic pressure. The estimated right ventricular systolic pressure is 37.8 mmHg.  3. The mitral valve is normal in structure. No evidence of mitral valve regurgitation. No evidence of mitral stenosis.  4. Tricuspid valve regurgitation is mild to moderate.  5. The aortic valve is abnormal. Aortic valve regurgitation is mild. Mild aortic valve stenosis. Aortic valve mean gradient measures 14.0 mmHg. Aortic valve Vmax measures 2.47 m/s. The phenotype of forme fruste bicuspid valve is not well shown in this evaluation (biplane assesment). Comparison(s): A prior study was performed on 08/11/2019. No significant change from prior study. No  significant aortic dilation seen in this study      Disposition Plan  :    Status is: Inpatient  Remains inpatient appropriate because:IV treatments appropriate due to intensity of illness or inability to take PO  Dispo: The patient is from: Home              Anticipated d/c is to: Home              Patient currently is not medically stable to d/c.   Difficult to place patient No  DVT Prophylaxis  :  Lovenox    Lab Results  Component Value Date   PLT 210 02/20/2021    Diet :  Diet Order             Diet heart healthy/carb modified Room service appropriate? Yes; Fluid consistency: Thin  Diet effective now                    Inpatient Medications  Scheduled Meds:  cloNIDine  0.3 mg Oral QHS   gabapentin  300 mg Oral TID   insulin aspart  0-15 Units Subcutaneous TID WC   lamoTRIgine  150 mg Oral QPC breakfast   rivaroxaban  15 mg Oral BID WC   Followed by   Melene Muller ON 03/13/2021] rivaroxaban  20 mg Oral Q supper   rOPINIRole  8 mg Oral QHS   sodium chloride flush  3 mL Intravenous Q12H   vortioxetine HBr  20 mg Oral Daily   Continuous Infusions: PRN Meds:.acetaminophen **OR** acetaminophen, diphenhydrAMINE, polyethylene glycol  Antibiotics  :    Anti-infectives (From admission, onward)    None        Time Spent in minutes  30   Susa Raring M.D on 02/20/2021 at 11:08 AM  To page go to [email or web address]   [facility] -  Office  [phone]  See all Orders from today for further details    Objective:   Vitals:   02/19/21 2315 02/20/21 0347 02/20/21 0700 02/20/21 0743  BP: 124/79 97/60  132/68  Pulse: 67 65 (!) 51 (!) 52  Resp: 18 15 15 15   Temp: 98.1 F (36.7 C) 97.7 F (36.5 C)  97.6 F (36.4 C)  TempSrc: Oral Oral  Oral  SpO2: 92% 96% 99% 98%  Weight:  (!) 137.4 kg (!) 138.2 kg   Height:        Wt Readings from Last 3 Encounters:  02/20/21 (!) 138.2 kg  02/18/21 (!) 140.5 kg  01/30/21 (!) 141.7 kg     Intake/Output  Summary (Last 24 hours) at 02/20/2021 1108 Last data filed at 02/20/2021 04/22/2021 Gross per 24 hour  Intake 120 ml  Output --  Net 120 ml     Physical Exam  Awake Alert, No new F.N deficits, Normal affect [place].AT,PERRAL Supple Neck, No JVD,   Symmetrical Chest wall movement, Good air movement bilaterally, CTAB RRR,No Gallops, Rubs or new Murmurs, No Parasternal Heave +ve B.Sounds, Abd Soft, No tenderness,   No Cyanosis, Clubbing or edema,         Data Review:    CBC Recent Labs  Lab 02/18/21 1512 02/18/21 1658 02/19/21 0022 02/20/21 0125  WBC 8.7 8.3 7.5 5.4  HGB 12.9 12.3 12.1 11.5*  HCT 39.6 39.6 38.8 37.5  PLT 248.0 236 224 210  MCV 87.5 89.2 90.4 91.0  MCH  --  27.7 28.2 27.9  MCHC 32.5 31.1 31.2 30.7  RDW 15.3 14.3 14.5 14.4  LYMPHSABS 1.8 1.8  --   --   MONOABS 0.5 0.4  --   --   EOSABS 0.3 0.3  --   --   BASOSABS 0.1 0.1  --   --     Recent Labs  Lab 02/18/21 1512 02/18/21 1658 02/19/21 0022 02/20/21 0125  NA 143 139 141 139  K 4.5 4.2 4.1 4.2  CL 106 106 107 106  CO2 27 24 25 26   GLUCOSE 123* 103* 94 104*  BUN 20 21* 15 13  CREATININE 1.20 1.09* 1.04* 1.01*  CALCIUM 9.7 9.0 9.3 8.9  AST  --   --   --  23  ALT  --   --   --  24  ALKPHOS  --   --   --  77  BILITOT  --   --   --  0.8  ALBUMIN  --   --   --  3.3*  MG  --   --   --  2.1  BNP  --  110.5*  --  77.9    ------------------------------------------------------------------------------------------------------------------ No results for input(s): CHOL, HDL, LDLCALC, TRIG, CHOLHDL, LDLDIRECT in the last 72 hours.  Lab Results  Component Value Date   HGBA1C 6.2 (H) 11/08/2020   ------------------------------------------------------------------------------------------------------------------ No results for input(s): TSH, T4TOTAL, T3FREE, THYROIDAB in the last 72 hours.  Invalid input(s): FREET3  Cardiac Enzymes No results for input(s): CKMB, TROPONINI, MYOGLOBIN in the last 168  hours.  Invalid input(s): CK ------------------------------------------------------------------------------------------------------------------    Component Value Date/Time   BNP 77.9 02/20/2021 0125    Radiology Reports DG Chest 2 View  Result Date: 01/29/2021 CLINICAL DATA:  Shortness of breath and fatigue. EXAM: CHEST - 2 VIEW COMPARISON:  Radiograph and chest CTA 12/16/2020 at [facility]. FINDINGS: The cardiomediastinal contours are stable. Occasional areas of atelectasis, left greater than right. Pulmonary  vasculature is normal. No consolidation, pleural effusion, or pneumothorax. No acute osseous abnormalities are seen. IMPRESSION: Occasional areas of atelectasis, left greater than right. Electronically Signed   By: Narda Rutherford M.D.   On: 01/29/2021 19:02   CT Angio Chest W/Cm &/Or Wo Cm  Result Date: 02/18/2021 CLINICAL DATA:  Recurrent PE, new onset shortness of breath EXAM: CT ANGIOGRAPHY CHEST WITH CONTRAST TECHNIQUE: Multidetector CT imaging of the chest was performed using the standard protocol during bolus administration of intravenous contrast. Multiplanar CT image reconstructions and MIPs were obtained to evaluate the vascular anatomy. CONTRAST:  OMNIPAQUE IOHEXOL 350 MG/ML SOLN COMPARISON:  01/29/2021 FINDINGS: Cardiovascular: Satisfactory opacification of the pulmonary arteries to the segmental level. There is nearly occlusive, lobar to segmental embolus throughout the right lung (series 4, image 48, 67, 76). No embolus identified in the left lung.Global cardiomegaly. Mildly elevated RV LV ratio, 1.1-1. Gross enlargement of the main pulmonary artery, measuring up to 4.2 cm in caliber. No pericardial effusion. Mediastinum/Nodes: No enlarged mediastinal, hilar, or axillary lymph nodes. Small hiatal hernia. Thyroid gland, trachea, and esophagus demonstrate no significant findings. Lungs/Pleura: Diffuse mosaic attenuation of the airspaces. No pleural effusion or pneumothorax.  Upper Abdomen: No acute abnormality.  Hepatic steatosis. Musculoskeletal: No chest wall abnormality. No acute or significant osseous findings. Review of the MIP images confirms the above findings. IMPRESSION: 1. There is nearly occlusive, lobar to segmental embolus throughout the right lung. No embolus identified in the left lung. 2. Global cardiomegaly. Mildly elevated RV LV ratio, 1.1-1. Gross enlargement of the main pulmonary artery. Findings are consistent with submassive pulmonary embolus and concerning for right heart strain. Correlate with echocardiographic findings. 3. Diffuse mosaic attenuation of the airspaces, suggestive of small airways disease and or chronic thromboembolic disease. 4. Hepatic steatosis. Call report request was placed at the time of interpretation. Final communication will be documented. Electronically Signed   By: Jearld Lesch M.D.   On: 02/18/2021 16:36   CT Angio Chest PE W and/or Wo Contrast  Result Date: 01/29/2021 CLINICAL DATA:  Shortness of breath for 3 days, history of prior COVID-19 infection as well as pulmonary embolus EXAM: CT ANGIOGRAPHY CHEST WITH CONTRAST TECHNIQUE: Multidetector CT imaging of the chest was performed using the standard protocol during bolus administration of intravenous contrast. Multiplanar CT image reconstructions and MIPs were obtained to evaluate the vascular anatomy. CONTRAST:  85mL OMNIPAQUE IOHEXOL 350 MG/ML SOLN COMPARISON:  12/16/2020 FINDINGS: Cardiovascular: Thoracic aorta again demonstrates dilatation to 4.2 cm in the ascending aorta. Normal tapering in the thoracic aortic arch is noted. Descending thoracic aorta is within normal limits. The pulmonary artery is well visualized with a normal branching pattern bilaterally. Previously seen residual right lower lobe thrombus has resolved in the interval from the prior exam. No new pulmonary emboli are noted. Mediastinum/Nodes: Thoracic inlet is within normal limits. No sizable hilar or  mediastinal adenopathy is noted. The esophagus as visualized is within normal limits. Lungs/Pleura: Lungs are well aerated bilaterally. No focal infiltrate or sizable effusion is seen. No parenchymal nodules are noted. Previously seen left basilar atelectasis has resolved. Upper Abdomen: Visualized upper abdomen again shows fatty infiltration of the liver. Musculoskeletal: Degenerative changes of the thoracic spine are noted. Review of the MIP images confirms the above findings. IMPRESSION: Resolution of previously seen residual pulmonary emboli within the right pulmonary artery. No new pulmonary emboli are seen. Persistent dilatation of the ascending aorta to 4.2 cm. Recommend annual imaging followup by CTA or MRA. This recommendation  follows 2010 ACCF/AHA/AATS/ACR/ASA/SCA/SCAI/SIR/STS/SVM Guidelines for the Diagnosis and Management of Patients with Thoracic Aortic Disease. Circulation. 2010; 121: U272-Z366. Aortic aneurysm NOS (ICD10-I71.9) Fatty liver Electronically Signed   By: Alcide Clever M.D.   On: 01/29/2021 19:57   ECHOCARDIOGRAM COMPLETE  Result Date: 02/19/2021    ECHOCARDIOGRAM REPORT   Patient Name:   TRAMYA SCHOENFELDER Date of Exam: 02/19/2021 Medical Rec #:  [id number]      Height:       65.0 in Accession #:    [id number]     Weight:       306.4 lb Date of Birth:  July 17, 1965      BSA:          2.371 m Patient Age:    55 years       BP:           144/86 mmHg Patient Gender: F              HR:           80 bpm. Exam Location:  Inpatient Procedure: 2D Echo, Cardiac Doppler and Color Doppler Indications:    I26.02 Pulmonary embolus  History:        Patient has prior history of Echocardiogram examinations, most                 recent 11/09/2020. Risk Factors:Diabetes.  Sonographer:    Roosvelt Maser RDCS Referring Phys: [id number] Cecille Po MELVIN IMPRESSIONS  1. Left ventricular ejection fraction, by estimation, is 60%. The left ventricle has normal function. The left ventricle has no regional wall motion  abnormalities. Left ventricular diastolic parameters were normal.  2. Right ventricular systolic function is normal. The right ventricular size is mildly enlarged. There is mildly elevated pulmonary artery systolic pressure. The estimated right ventricular systolic pressure is 37.8 mmHg.  3. The mitral valve is normal in structure. No evidence of mitral valve regurgitation. No evidence of mitral stenosis.  4. Tricuspid valve regurgitation is mild to moderate.  5. The aortic valve is abnormal. Aortic valve regurgitation is mild. Mild aortic valve stenosis. Aortic valve mean gradient measures 14.0 mmHg. Aortic valve Vmax measures 2.47 m/s. The phenotype of forme fruste bicuspid valve is not well shown in this evaluation (biplane assesment). Comparison(s): A prior study was performed on 08/11/2019. No significant change from prior study. No significant aortic dilation seen in this study. FINDINGS  Left Ventricle: Left ventricular ejection fraction, by estimation, is 60%. The left ventricle has normal function. The left ventricle has no regional wall motion abnormalities. The left ventricular internal cavity size was normal in size. There is no left ventricular hypertrophy. Left ventricular diastolic parameters were normal. Right Ventricle: The right ventricular size is mildly enlarged. Right vetricular wall thickness was not well visualized. Right ventricular systolic function is normal. There is mildly elevated pulmonary artery systolic pressure. The tricuspid regurgitant  velocity is 2.95 m/s, and with an assumed right atrial pressure of 3 mmHg, the estimated right ventricular systolic pressure is 37.8 mmHg. Left Atrium: Left atrial size was normal in size. Right Atrium: Right atrial size was normal in size. Pericardium: Trivial pericardial effusion is present. Presence of pericardial fat pad. Mitral Valve: The mitral valve is normal in structure. No evidence of mitral valve regurgitation. No evidence of mitral valve  stenosis. Tricuspid Valve: The tricuspid valve is grossly normal. Tricuspid valve regurgitation is mild to moderate. No evidence of tricuspid stenosis. Aortic Valve: The aortic valve is abnormal. Aortic valve regurgitation is  mild. Mild aortic stenosis is present. Aortic valve mean gradient measures 14.0 mmHg. Aortic valve peak gradient measures 24.4 mmHg. Aortic valve area, by VTI measures 1.54 cm. Pulmonic Valve: The pulmonic valve was grossly normal. Pulmonic valve regurgitation is not visualized. No evidence of pulmonic stenosis. Aorta: The aortic root is normal in size and structure. IAS/Shunts: The atrial septum is grossly normal.  LEFT VENTRICLE PLAX 2D LVIDd:         5.00 cm   Diastology LVIDs:         3.20 cm   LV e' medial:    8 cm/s LV PW:         1.00 cm   LV E/e' medial:  11 LV IVS:        1.00 cm   LV e' lateral:   10 cm/s LVOT diam:     2.10 cm   LV E/e' lateral: 7.6 LV SV:         77 LV SV Index:   33 LVOT Area:     3.46 cm  RIGHT VENTRICLE RV Basal diam:  4.20 cm RV Mid diam:    3.10 cm LEFT ATRIUM             Index        RIGHT ATRIUM           Index LA diam:        4.50 cm 1.90 cm/m   RA Area:     23.50 cm LA Vol (A2C):   75.6 ml 31.88 ml/m  RA Volume:   80.00 ml  33.73 ml/m LA Vol (A4C):   41.7 ml 17.58 ml/m LA Biplane Vol: 57.2 ml 24.12 ml/m  AORTIC VALVE AV Area (Vmax):    1.53 cm AV Area (Vmean):   1.46 cm AV Area (VTI):     1.54 cm AV Vmax:           247.00 cm/s AV Vmean:          176.000 cm/s AV VTI:            0.501 m AV Peak Grad:      24.4 mmHg AV Mean Grad:      14.0 mmHg LVOT Vmax:         109.00 cm/s LVOT Vmean:        74.000 cm/s LVOT VTI:          0.223 m LVOT/AV VTI ratio: 0.45  AORTA Ao Root diam: 3.10 cm MITRAL VALVE                TRICUSPID VALVE MV Area (PHT): 4.19 cm     TR Peak grad:   34.8 mmHg MV Decel Time: 181 msec     TR Vmax:        295.00 cm/s MV E velocity: 77.10 cm/s MV A velocity: 104.00 cm/s  SHUNTS MV E/A ratio:  0.74         Systemic VTI:  0.22 m                              Systemic Diam: 2.10 cm Riley Lam MD Electronically signed by Riley Lam MD Signature Date/Time: 02/19/2021/2:34:55 PM    Final

## 2021-02-20 NOTE — Progress Notes (Signed)
ANTICOAGULATION CONSULT NOTE  Pharmacy Consult for Heparin>>Lovenox>>Xarelto Indication: acute pulmonary embolus, recurrent  Heparin Dosing Weight: 92kg  Labs: Recent Labs    02/18/21 1658 02/19/21 0022 02/20/21 0125  HGB 12.3 12.1 11.5*  HCT 39.6 38.8 37.5  PLT 236 224 210  APTT  --  62*  --   HEPARINUNFRC  --  0.44  --   CREATININE 1.09* 1.04* 1.01*     Assessment: 11 yof with hx recurrent PE (most recently reported 10/2020) on lifelong anticoagulation with Xarelto presenting with increased SOB and found to have recurrent PE concerning for RHS. Patient reports she missed 5 days of Xarelto PTA recently to insurance formulary issues, now back on therapy for 1 week now (last dose reported 10/10 at 0630). Pharmacy consulted to start heparin. CBC wnl, SCr 1.01 on presentation. No active bleed issues reported.    Plan:  Dc heparin DC lovenox 140mg  SQ BID Start Xarelto 15 mg po bid X21 days followed by 20 mg po qday after supper starting 03/13/2021   13/06/2020 BS, PharmD, BCPS, RPh 02/20/2021 8:57 AM

## 2021-02-20 NOTE — TOC Benefit Eligibility Note (Signed)
Patient Product/process development scientist completed.    The patient is currently admitted and upon discharge could be taking Eliquis 5 mg.  The current 30 day co-pay is, $154.22.   The patient is currently admitted and upon discharge could be taking Xarelto 20 mg.  The current 30 day co-pay is, $150.61.   The patient is insured through [employer]     Roland Earl, CPhT Pharmacy Patient Advocate Specialist [facility] Antimicrobial Stewardship Team Direct Number: [phone]  Fax: [phone]

## 2021-02-21 ENCOUNTER — Other Ambulatory Visit (HOSPITAL_COMMUNITY): Payer: Self-pay

## 2021-02-21 LAB — GLUCOSE, CAPILLARY: Glucose-Capillary: 118 mg/dL — ABNORMAL HIGH (ref 70–99)

## 2021-02-21 MED ORDER — RIVAROXABAN (XARELTO) VTE STARTER PACK (15 & 20 MG)
15.0000 mg | ORAL_TABLET | Freq: Two times a day (BID) | ORAL | 0 refills | Status: DC
  Filled 2021-02-21: qty 51, 30d supply, fill #0

## 2021-02-21 MED ORDER — RIVAROXABAN 20 MG PO TABS
20.0000 mg | ORAL_TABLET | Freq: Every day | ORAL | 0 refills | Status: DC
  Filled 2021-02-21: qty 30, 30d supply, fill #0

## 2021-02-21 NOTE — Progress Notes (Signed)
Discussed discharge instructions with patient she verbalized understanding. All belongings returned to patient. Pt given xarelto starter pack.

## 2021-02-21 NOTE — Discharge Summary (Signed)
ALESHKA CORNEY WPY:[id number] DOB: 1965-08-28 DOA: 02/18/2021  PCP: Dois Davenport, MD  Admit date: 02/18/2021  Discharge date: 02/21/2021  Admitted From: Home  Disposition:  Home   Recommendations for Outpatient Follow-up:   Follow up with PCP in 1-2 weeks  PCP Please obtain BMP/CBC, 2 view CXR in 1week,  (see Discharge instructions)   PCP Please follow up on the following pending results:    Home Health: None   Equipment/Devices: None  Consultations: None  Discharge Condition: Stable    CODE STATUS: Full    Diet Recommendation: Heart Healthy Low Carb    Chief Complaint  Patient presents with   Pulmonary Embolism     Brief history of present illness from the day of admission and additional interim summary    LILLITH MCNEFF is a 55 y.o. female with medical history significant of recurrent pulmonary embolism, diabetes, degenerative disc disease, depression, OSA, thoracic aortic aneurysm presenting with ongoing shortness of breath, apparently she was off of her chronic Xarelto for 5 days due to insurance issues, in the ER she was diagnosed with large PE on CTA and admitted to the hospital.                                                                 Hospital Course    Acute Hypoxic Resp. Failure due to Acute large right lung PE with RV starin on CTA - large burden on CTA - thankfully she was hemodynamically stable and her troponins were unremarkable, she was on Hep>>Lovenox for > 24hrs, now on Xaralto,  note patient was due to discussion in Xarelto home regimen for about 5 days due to insurance issues.  This is not Xarelto failure. She has had 3 previous PEs, this is the 4th PE.  Will require lifelong anticoagulation. TTE non acute.     2.  Morbid Obesity with OSA - BMI 50, follow with PCP, CPAP QHS.     3.  History of depression, PTSD and mood disorder.  Home medications which Lamictal and Trintellix will be continued.   4.  DM2 - Home Rx  Lab Results  Component Value Date   HGBA1C 6.2 (H) 11/08/2020    Discharge diagnosis     Principal Problem:   Pulmonary embolus, right ([name]) Active Problems:   DM (diabetes mellitus) ([name])   Sleep apnea   Severe recurrent major depression without psychotic features [facility])    Discharge instructions    Discharge Instructions     Diet - low sodium heart healthy   Complete by: As directed    Discharge instructions   Complete by: As directed    Follow with Primary MD Dois Davenport, MD in 7 days   Get CBC, CMP, 2 view Chest X ray -  checked next  visit within 1 week by Primary MD   Activity: As tolerated with Full fall precautions use walker/cane & assistance as needed  Disposition Home    Diet: Heart Healthy - Low Carb  Special Instructions: If you have smoked or chewed Tobacco  in the last 2 yrs please stop smoking, stop any regular Alcohol  and or any Recreational drug use.  On your next visit with your primary care physician please Get Medicines reviewed and adjusted.  Please request your Prim.MD to go over all Hospital Tests and Procedure/Radiological results at the follow up, please get all Hospital records sent to your Prim MD by signing hospital release before you go home.  If you experience worsening of your admission symptoms, develop shortness of breath, life threatening emergency, suicidal or homicidal thoughts you must seek medical attention immediately by calling 911 or calling your MD immediately  if symptoms less severe.  You Must read complete instructions/literature along with all the possible adverse reactions/side effects for all the Medicines you take and that have been prescribed to you. Take any new Medicines after you have completely understood and accpet all the possible adverse reactions/side effects.    Increase activity slowly   Complete by: As directed        Discharge Medications   Allergies as of 02/21/2021       Reactions   Sulfa Antibiotics Hives        Medication List     STOP taking these medications    rivaroxaban 20 MG Tabs tablet Commonly known as: XARELTO Replaced by: Xarelto Starter Pack       TAKE these medications    albuterol 108 (90 Base) MCG/ACT inhaler Commonly known as: VENTOLIN HFA Inhale 2 puffs into the lungs every 6 (six) hours as needed for wheezing or shortness of breath.   Breztri Aerosphere 160-9-4.8 MCG/ACT Aero Generic drug: Budeson-Glycopyrrol-Formoterol Inhale 2 puffs into the lungs 2 (two) times daily as needed (wheezing/shortness of breath).   cloNIDine 0.1 MG tablet Commonly known as: CATAPRES Take 0.1-0.3 mg by mouth at bedtime. Taking 0.3 mg at bedtime now   dextromethorphan 30 MG/5ML liquid Commonly known as: DELSYM Take 15 mg by mouth as needed for cough.   furosemide 20 MG tablet Commonly known as: Lasix Take 1 tablet (20 mg total) by mouth daily as needed for edema (weight gain of >3lbs in 1 day or >5lbs in 2 days.).   gabapentin 300 MG capsule Commonly known as: NEURONTIN Take 300-600 mg by mouth See admin instructions. Taking 2 capsules (600 mg) in the am and 300 mg at bedtime   guaiFENesin-dextromethorphan 100-10 MG/5ML syrup Commonly known as: ROBITUSSIN DM Take 5 mLs by mouth every 4 (four) hours as needed for cough.   L-Methylfolate 15 MG Tabs Take 15 mg by mouth daily.   lamoTRIgine 150 MG tablet Commonly known as: LAMICTAL Take 150 mg by mouth daily after breakfast.   metFORMIN 500 MG tablet Commonly known as: GLUCOPHAGE Take 500 mg by mouth 2 (two) times daily with a meal.   oxymetazoline 0.05 % nasal spray Commonly known as: AFRIN Place 1 spray into both nostrils 2 (two) times daily as needed for congestion.   pantoprazole 40 MG tablet Commonly known as: PROTONIX Take 40 mg by mouth daily as  needed for indigestion.   pseudoephedrine 30 MG tablet Commonly known as: SUDAFED Take 30 mg by mouth every 4 (four) hours as needed for congestion.   rOPINIRole 4 MG tablet Commonly known as:  REQUIP Take 8-12 mg by mouth at bedtime.   Trintellix 20 MG Tabs tablet Generic drug: vortioxetine HBr Take 20 mg by mouth daily.   Xarelto Starter Pack Generic drug: Rivaroxaban Stater Pack (15 mg and 20 mg) Take one 15 mg tablet by mouth 2 (two) times daily with a meal for 21 days then decrease to 1 tablet 20mg  once daily with meal. Replaces: rivaroxaban 20 MG Tabs tablet   rivaroxaban 20 MG Tabs tablet Commonly known as: XARELTO Take 1 tablet (20 mg total) by mouth daily with supper. Start taking on: March 13, 2021         Follow-up Information     March 15, 2021, MD. Schedule an appointment as soon as possible for a visit in 1 week(s).   Specialty: Family Medicine Contact information: [address] [place] [address] [phone]         [phone], MD .   Specialty: Cardiology Contact information: [address] [place] [phone]                 Major procedures and Radiology Reports - PLEASE review detailed and final reports thoroughly  -       DG Chest 2 View  Result Date: 01/29/2021 CLINICAL DATA:  Shortness of breath and fatigue. EXAM: CHEST - 2 VIEW COMPARISON:  Radiograph and chest [facility]A 12/16/2020 at [facility]. FINDINGS: The cardiomediastinal contours are stable. Occasional areas of atelectasis, left greater than right. Pulmonary vasculature is normal. No consolidation, pleural effusion, or pneumothorax. No acute osseous abnormalities are seen. IMPRESSION: Occasional areas of atelectasis, left greater than right. Electronically Signed   By: [facility] M.D.   On: 01/29/2021 19:02   CT Angio Chest W/Cm &/Or Wo Cm  Result Date: 02/18/2021 CLINICAL DATA:  Recurrent PE, new onset  shortness of breath EXAM: CT ANGIOGRAPHY CHEST WITH CONTRAST TECHNIQUE: Multidetector CT imaging of the chest was performed using the standard protocol during bolus administration of intravenous contrast. Multiplanar CT image reconstructions and MIPs were obtained to evaluate the vascular anatomy. CONTRAST:  04/20/2021 OMNIPAQUE IOHEXOL 350 MG/ML SOLN COMPARISON:  01/29/2021 FINDINGS: Cardiovascular: Satisfactory opacification of the pulmonary arteries to the segmental level. There is nearly occlusive, lobar to segmental embolus throughout the right lung (series 4, image 48, 67, 76). No embolus identified in the left lung.Global cardiomegaly. Mildly elevated RV LV ratio, 1.1-1. Gross enlargement of the main pulmonary artery, measuring up to 4.2 cm in caliber. No pericardial effusion. Mediastinum/Nodes: No enlarged mediastinal, hilar, or axillary lymph nodes. Small hiatal hernia. Thyroid gland, trachea, and esophagus demonstrate no significant findings. Lungs/Pleura: Diffuse mosaic attenuation of the airspaces. No pleural effusion or pneumothorax. Upper Abdomen: No acute abnormality.  Hepatic steatosis. Musculoskeletal: No chest wall abnormality. No acute or significant osseous findings. Review of the MIP images confirms the above findings. IMPRESSION: 1. There is nearly occlusive, lobar to segmental embolus throughout the right lung. No embolus identified in the left lung. 2. Global cardiomegaly. Mildly elevated RV LV ratio, 1.1-1. Gross enlargement of the main pulmonary artery. Findings are consistent with submassive pulmonary embolus and concerning for right heart strain. Correlate with echocardiographic findings. 3. Diffuse mosaic attenuation of the airspaces, suggestive of small airways disease and or chronic thromboembolic disease. 4. Hepatic steatosis. Call report request was placed at the time of interpretation. Final communication will be documented. Electronically Signed   By: 01/31/2021 M.D.   On: 02/18/2021  16:36  CT Angio Chest PE W and/or Wo Contrast  Result Date: 01/29/2021 CLINICAL DATA:  Shortness of breath for 3 days, history of prior COVID-19 infection as well as pulmonary embolus EXAM: CT ANGIOGRAPHY CHEST WITH CONTRAST TECHNIQUE: Multidetector CT imaging of the chest was performed using the standard protocol during bolus administration of intravenous contrast. Multiplanar CT image reconstructions and MIPs were obtained to evaluate the vascular anatomy. CONTRAST:  2mL OMNIPAQUE IOHEXOL 350 MG/ML SOLN COMPARISON:  12/16/2020 FINDINGS: Cardiovascular: Thoracic aorta again demonstrates dilatation to 4.2 cm in the ascending aorta. Normal tapering in the thoracic aortic arch is noted. Descending thoracic aorta is within normal limits. The pulmonary artery is well visualized with a normal branching pattern bilaterally. Previously seen residual right lower lobe thrombus has resolved in the interval from the prior exam. No new pulmonary emboli are noted. Mediastinum/Nodes: Thoracic inlet is within normal limits. No sizable hilar or mediastinal adenopathy is noted. The esophagus as visualized is within normal limits. Lungs/Pleura: Lungs are well aerated bilaterally. No focal infiltrate or sizable effusion is seen. No parenchymal nodules are noted. Previously seen left basilar atelectasis has resolved. Upper Abdomen: Visualized upper abdomen again shows fatty infiltration of the liver. Musculoskeletal: Degenerative changes of the thoracic spine are noted. Review of the MIP images confirms the above findings. IMPRESSION: Resolution of previously seen residual pulmonary emboli within the right pulmonary artery. No new pulmonary emboli are seen. Persistent dilatation of the ascending aorta to 4.2 cm. Recommend annual imaging followup by CTA or MRA. This recommendation follows 2010 ACCF/AHA/AATS/ACR/ASA/SCA/SCAI/SIR/STS/SVM Guidelines for the Diagnosis and Management of Patients with Thoracic Aortic Disease.  Circulation. 2010; 121: W098-J191. Aortic aneurysm NOS (ICD10-I71.9) Fatty liver Electronically Signed   By: Alcide Clever M.D.   On: 01/29/2021 19:57   ECHOCARDIOGRAM COMPLETE  Result Date: 02/19/2021    ECHOCARDIOGRAM REPORT   Patient Name:   SHYONNA CARLIN Date of Exam: 02/19/2021 Medical Rec #:  [id number]      Height:       65.0 in Accession #:    [id number]     Weight:       306.4 lb Date of Birth:  08-09-1965      BSA:          2.371 m Patient Age:    55 years       BP:           144/86 mmHg Patient Gender: F              HR:           80 bpm. Exam Location:  Inpatient Procedure: 2D Echo, Cardiac Doppler and Color Doppler Indications:    I26.02 Pulmonary embolus  History:        Patient has prior history of Echocardiogram examinations, most                 recent 11/09/2020. Risk Factors:Diabetes.  Sonographer:    Roosvelt Maser RDCS Referring Phys: [id number] Cecille Po MELVIN IMPRESSIONS  1. Left ventricular ejection fraction, by estimation, is 60%. The left ventricle has normal function. The left ventricle has no regional wall motion abnormalities. Left ventricular diastolic parameters were normal.  2. Right ventricular systolic function is normal. The right ventricular size is mildly enlarged. There is mildly elevated pulmonary artery systolic pressure. The estimated right ventricular systolic pressure is 37.8 mmHg.  3. The mitral valve is normal in structure. No evidence of mitral valve regurgitation. No evidence of mitral stenosis.  4. Tricuspid valve  regurgitation is mild to moderate.  5. The aortic valve is abnormal. Aortic valve regurgitation is mild. Mild aortic valve stenosis. Aortic valve mean gradient measures 14.0 mmHg. Aortic valve Vmax measures 2.47 m/s. The phenotype of forme fruste bicuspid valve is not well shown in this evaluation (biplane assesment). Comparison(s): A prior study was performed on 08/11/2019. No significant change from prior study. No significant aortic dilation seen in this  study. FINDINGS  Left Ventricle: Left ventricular ejection fraction, by estimation, is 60%. The left ventricle has normal function. The left ventricle has no regional wall motion abnormalities. The left ventricular internal cavity size was normal in size. There is no left ventricular hypertrophy. Left ventricular diastolic parameters were normal. Right Ventricle: The right ventricular size is mildly enlarged. Right vetricular wall thickness was not well visualized. Right ventricular systolic function is normal. There is mildly elevated pulmonary artery systolic pressure. The tricuspid regurgitant  velocity is 2.95 m/s, and with an assumed right atrial pressure of 3 mmHg, the estimated right ventricular systolic pressure is 37.8 mmHg. Left Atrium: Left atrial size was normal in size. Right Atrium: Right atrial size was normal in size. Pericardium: Trivial pericardial effusion is present. Presence of pericardial fat pad. Mitral Valve: The mitral valve is normal in structure. No evidence of mitral valve regurgitation. No evidence of mitral valve stenosis. Tricuspid Valve: The tricuspid valve is grossly normal. Tricuspid valve regurgitation is mild to moderate. No evidence of tricuspid stenosis. Aortic Valve: The aortic valve is abnormal. Aortic valve regurgitation is mild. Mild aortic stenosis is present. Aortic valve mean gradient measures 14.0 mmHg. Aortic valve peak gradient measures 24.4 mmHg. Aortic valve area, by VTI measures 1.54 cm. Pulmonic Valve: The pulmonic valve was grossly normal. Pulmonic valve regurgitation is not visualized. No evidence of pulmonic stenosis. Aorta: The aortic root is normal in size and structure. IAS/Shunts: The atrial septum is grossly normal.  LEFT VENTRICLE PLAX 2D LVIDd:         5.00 cm   Diastology LVIDs:         3.20 cm   LV e' medial:    8 cm/s LV PW:         1.00 cm   LV E/e' medial:  11 LV IVS:        1.00 cm   LV e' lateral:   10 cm/s LVOT diam:     2.10 cm   LV E/e'  lateral: 7.6 LV SV:         77 LV SV Index:   33 LVOT Area:     3.46 cm  RIGHT VENTRICLE RV Basal diam:  4.20 cm RV Mid diam:    3.10 cm LEFT ATRIUM             Index        RIGHT ATRIUM           Index LA diam:        4.50 cm 1.90 cm/m   RA Area:     23.50 cm LA Vol (A2C):   75.6 ml 31.88 ml/m  RA Volume:   80.00 ml  33.73 ml/m LA Vol (A4C):   41.7 ml 17.58 ml/m LA Biplane Vol: 57.2 ml 24.12 ml/m  AORTIC VALVE AV Area (Vmax):    1.53 cm AV Area (Vmean):   1.46 cm AV Area (VTI):     1.54 cm AV Vmax:           247.00 cm/s AV Vmean:  176.000 cm/s AV VTI:            0.501 m AV Peak Grad:      24.4 mmHg AV Mean Grad:      14.0 mmHg LVOT Vmax:         109.00 cm/s LVOT Vmean:        74.000 cm/s LVOT VTI:          0.223 m LVOT/AV VTI ratio: 0.45  AORTA Ao Root diam: 3.10 cm MITRAL VALVE                TRICUSPID VALVE MV Area (PHT): 4.19 cm     TR Peak grad:   34.8 mmHg MV Decel Time: 181 msec     TR Vmax:        295.00 cm/s MV E velocity: 77.10 cm/s MV A velocity: 104.00 cm/s  SHUNTS MV E/A ratio:  0.74         Systemic VTI:  0.22 m                             Systemic Diam: 2.10 cm Riley Lam MD Electronically signed by Riley Lam MD Signature Date/Time: 02/19/2021/2:34:55 PM    Final       Today   Subjective    [name] today has no headache,no chest abdominal pain,no new weakness tingling or numbness, feels much better wants to go home today.     Objective   Blood pressure 112/70, pulse 72, temperature 98.4 F (36.9 C), temperature source Oral, resp. rate 16, height  (1.651 m), weight (!) 138.2 kg, last menstrual period 01/29/2013, SpO2 100 %.  No intake or output data in the 24 hours ending 02/21/21 1110  Exam  Awake Alert, No new F.N deficits, Normal affect [place].AT,PERRAL Supple Neck,No JVD, No cervical lymphadenopathy appriciated.  Symmetrical Chest wall movement, Good air movement bilaterally, CTAB RRR,No Gallops,Rubs or new Murmurs, No  Parasternal Heave +ve B.Sounds, Abd Soft, Non tender, No organomegaly appriciated, No rebound -guarding or rigidity. No Cyanosis, Clubbing or edema, No new Rash or bruise   Data Review   CBC w Diff:  Lab Results  Component Value Date   WBC 5.4 02/20/2021   HGB 11.5 (L) 02/20/2021   HGB 12.5 04/18/2020   HCT 37.5 02/20/2021   HCT 37.7 04/18/2020   PLT 210 02/20/2021   PLT 265 04/18/2020   LYMPHOPCT 21 02/18/2021   MONOPCT 5 02/18/2021   EOSPCT 4 02/18/2021   BASOPCT 1 02/18/2021    CMP:  Lab Results  Component Value Date   NA 139 02/20/2021   NA 141 04/18/2020   K 4.2 02/20/2021   CL 106 02/20/2021   CO2 26 02/20/2021   BUN 13 02/20/2021   BUN 15 04/18/2020   CREATININE 1.01 (H) 02/20/2021   CREATININE 0.98 09/25/2015   PROT 5.9 (L) 02/20/2021   PROT 6.6 04/18/2020   ALBUMIN 3.3 (L) 02/20/2021   ALBUMIN 4.2 04/18/2020   BILITOT 0.8 02/20/2021   BILITOT 0.4 04/18/2020   ALKPHOS 77 02/20/2021   AST 23 02/20/2021   ALT 24 02/20/2021  .   Total Time in preparing paper work, data evaluation and todays exam - 35 minutes  Susa Raring M.D on 02/21/2021 at 11:10 AM  [facility]

## 2021-02-22 ENCOUNTER — Telehealth: Payer: Self-pay | Admitting: *Deleted

## 2021-02-22 ENCOUNTER — Ambulatory Visit: Payer: Commercial Managed Care - PPO | Admitting: Adult Health

## 2021-02-22 ENCOUNTER — Encounter: Payer: Self-pay | Admitting: Adult Health

## 2021-02-22 ENCOUNTER — Other Ambulatory Visit: Payer: Self-pay

## 2021-02-22 DIAGNOSIS — E66813 Obesity, class 3: Secondary | ICD-10-CM

## 2021-02-22 DIAGNOSIS — I2699 Other pulmonary embolism without acute cor pulmonale: Secondary | ICD-10-CM | POA: Diagnosis not present

## 2021-02-22 NOTE — Telephone Encounter (Signed)
[name] with [employer], requested a return call regarding the return of the portable oxygen concentrator that she received from our office 2 days ago.  She was told to return it to the [facility] office, however, I was directed to have the patient arrange the return with [employer] directly.  Requested clarification on the process of return of portable oxygen concentrators (on wheels) that we give to patients that need oxygen late in the day that may not get oxygen set up until the following day.  Will await return call.

## 2021-02-22 NOTE — Progress Notes (Signed)
  ID: Erika Woods, female    DOB: 1966-03-29, 55 y.o.   MRN: [id number]  Chief Complaint  Patient presents with   Follow-up    Referring provider: Dois Davenport, MD  HPI: 55 year old female never smoker seen for pulmonary consult January 30, 2021 for recurrent pulmonary embolism (first PE 2019-second PE in July 2020 and third PE June 2022 patient is on lifelong anticoagulation Previous work-up with hypercoagulable panel, prothrombin gene mutation and factor V Leiden were all negative during work-up in July 2020 while on anticoagulation Medical history significant for sleep apnea on CPAP, diabetes, fatty liver, nonalcoholic cirrhosis  TEST/EVENTS :  2D echo November 09, 2020 showed EF at 60-65%, right ventricular systolic function is normal.  RV size is moderately enlarged.  02/22/2021 Follow up: Recurrent PE  Patient returns for a post hospital follow-up.  Patient has a known history of recurrent PE as above.  Patient was seen last week with increased shortness of breath and exertional hypoxemia.  Patient was set up for a CT chest.  Patient had a break in Xarelto therapy for 5 days due to insurance issues.  CT chest showed nearly occlusive lobar to segmental embolus throughout the right lung.  Global cardiomegaly.  And diffuse mosaic attenuation of the airspaces. Patient required hospitalization.  She was treated with heparin and transition to Lovenox and then back onto Xarelto.  She was discharged on a Xarelto starter pack.  And then will resume Xarelto 20 mg daily.  It was felt that this was not a Xarelto failure since she had had a break in therapy.  2D echo showed preserved EF at 60%, mildly elevated pulmonary artery systolic pressure and mildly enlarged right ventricle. Patient's troponin was negative. During hospitalization patient's O2 saturations improved and she did not require oxygen at discharge.  Today on arrival O2 saturations are 94% on room air.  Patient says she is  feeling better she has less shortness of breath.  Has had no further hypoxemia at home. Patient says she is tolerating Xarelto well.  Patient education was given.   Allergies  Allergen Reactions   Sulfa Antibiotics Hives    Immunization History  Administered Date(s) Administered   Influenza,inj,Quad PF,6+ Mos 03/14/2019, 03/21/2019, 02/19/2021   Influenza-Unspecified 01/12/2018   PFIZER(Purple Top)SARS-COV-2 Vaccination 09/20/2019, 10/13/2019    Past Medical History:  Diagnosis Date   Acute pulmonary embolism ([name]) 11/11/2018   Acute pulmonary embolism with acute cor pulmonale ([name]) 09/16/2017   Acute superficial venous thrombosis of left lower extremity    Aortic insufficiency    Echocardiogram 08/2019: prob bicuspid AoV, mild to mod AI, mild AS (mean 12 mmHg), EF 55-60, no RWMA, Gr 1 DD, GLS -22.2%, normal RVSF, Ao Root 39 mm   Coronary CTA    Coronary CTA 08/2019: Calcium score 0, no evidence of CAD, ascending aorta 4 cm   Diabetes mellitus without complication ([name])    DM (diabetes mellitus) ([name]) 08/18/2011   Impaired fasting glucose    Mood disorder ([name]) 08/18/2011   Obesity, Class III, BMI 40-49.9 (morbid obesity) ([name]) 09/16/2017   Obstructive sleep apnea on CPAP    Osteoarthritis of knee    Personal history of pulmonary embolism    PTSD (post-traumatic stress disorder) 08/18/2011   Pulmonary emboli ([name]) 09/15/2017   Renal disorder    kideny stones   Restless leg syndrome    Severe recurrent major depression without psychotic features ([name]) 02/17/2018   Sleep apnea 08/18/2011   Thoracic aortic aneurysm  Thoracic aortic aneurysm without rupture     Tobacco History: Social History   Tobacco Use  Smoking Status Never  Smokeless Tobacco Never   Counseling given: Not Answered   Outpatient Medications Prior to Visit  Medication Sig Dispense Refill   albuterol (VENTOLIN HFA) 108 (90 Base) MCG/ACT inhaler Inhale 2 puffs into the lungs every 6 (six) hours as needed for  wheezing or shortness of breath.     cloNIDine (CATAPRES) 0.1 MG tablet Take 0.1-0.3 mg by mouth at bedtime. Taking 0.3 mg at bedtime now     dextromethorphan (DELSYM) 30 MG/5ML liquid Take 15 mg by mouth as needed for cough.     gabapentin (NEURONTIN) 300 MG capsule Take 300-600 mg by mouth See admin instructions. Taking 2 capsules (600 mg) in the am and 300 mg at bedtime     guaiFENesin-dextromethorphan (ROBITUSSIN DM) 100-10 MG/5ML syrup Take 5 mLs by mouth every 4 (four) hours as needed for cough.     L-Methylfolate 15 MG TABS Take 15 mg by mouth daily.     lamoTRIgine (LAMICTAL) 150 MG tablet Take 150 mg by mouth daily after breakfast.      metFORMIN (GLUCOPHAGE) 500 MG tablet Take 500 mg by mouth 2 (two) times daily with a meal.      oxymetazoline (AFRIN) 0.05 % nasal spray Place 1 spray into both nostrils 2 (two) times daily as needed for congestion.     pantoprazole (PROTONIX) 40 MG tablet Take 40 mg by mouth daily as needed for indigestion.     pseudoephedrine (SUDAFED) 30 MG tablet Take 30 mg by mouth every 4 (four) hours as needed for congestion.     RIVAROXABAN (XARELTO) VTE STARTER PACK (15 & 20 MG) Take one 15 mg tablet by mouth 2 (two) times daily with a meal for 21 days then decrease to 1 tablet 20mg  once daily with meal. 51 each 0   rOPINIRole (REQUIP) 4 MG tablet Take 8-12 mg by mouth at bedtime.     TRINTELLIX 20 MG TABS tablet Take 20 mg by mouth daily.     [START ON 03/13/2021] rivaroxaban (XARELTO) 20 MG TABS tablet Take 1 tablet (20 mg total) by mouth daily with supper. (Patient not taking: Reported on 02/22/2021) 30 tablet 0   BREZTRI AEROSPHERE 160-9-4.8 MCG/ACT AERO Inhale 2 puffs into the lungs 2 (two) times daily as needed (wheezing/shortness of breath). (Patient not taking: Reported on 02/22/2021)     furosemide (LASIX) 20 MG tablet Take 1 tablet (20 mg total) by mouth daily as needed for edema (weight gain of >3lbs in 1 day or >5lbs in 2 days.). (Patient not taking:  Reported on 02/22/2021) 30 tablet 0   No facility-administered medications prior to visit.     Review of Systems:   Constitutional:   No  weight loss, night sweats,  Fevers, chills,  +fatigue, or  lassitude.  HEENT:   No headaches,  Difficulty swallowing,  Tooth/dental problems, or  Sore throat,                No sneezing, itching, ear ache, nasal congestion, post nasal drip,   CV:  No chest pain,  Orthopnea, PND, swelling in lower extremities, anasarca, dizziness, palpitations, syncope.   GI  No heartburn, indigestion, abdominal pain, nausea, vomiting, diarrhea, change in bowel habits, loss of appetite, bloody stools.   Resp:  No excess mucus, no productive cough,  No non-productive cough,  No coughing up of blood.  No change in color  of mucus.  No wheezing.  No chest wall deformity  Skin: no rash or lesions.  GU: no dysuria, change in color of urine, no urgency or frequency.  No flank pain, no hematuria   MS:  No joint pain or swelling.  No decreased range of motion.  No back pain.    Physical Exam  BP 110/80 (BP Location: Left Wrist, Patient Position: Sitting, Cuff Size: Small)   Pulse (!) 106   Temp 98 F (36.7 C) (Oral)   Ht 5\' 5"  (1.651 m)   Wt (!) 304 lb 6.4 oz (138.1 kg)   LMP 01/29/2013   SpO2 94%   BMI 50.65 kg/m   GEN: A/Ox3; pleasant , NAD, well nourished    HEENT:  [place]/AT,  NOSE-clear, THROAT-clear, no lesions, no postnasal drip or exudate noted.   NECK:  Supple w/ fair ROM; no JVD; normal carotid impulses w/o bruits; no thyromegaly or nodules palpated; no lymphadenopathy.    RESP  Clear  P & A; w/o, wheezes/ rales/ or rhonchi. no accessory muscle use, no dullness to percussion  CARD:  RRR, no m/r/g, tr  peripheral edema, pulses intact, no cyanosis or clubbing.  GI:   Soft & nt; nml bowel sounds; no organomegaly or masses detected.   Musco: Warm bil, no deformities or joint swelling noted.   Neuro: alert, no focal deficits noted.    Skin: Warm, no  lesions or rashes    Lab Results:  CBC    Component Value Date/Time   WBC 5.4 02/20/2021 0125   RBC 4.12 02/20/2021 0125   HGB 11.5 (L) 02/20/2021 0125   HGB 12.5 04/18/2020 1326   HCT 37.5 02/20/2021 0125   HCT 37.7 04/18/2020 1326   PLT 210 02/20/2021 0125   PLT 265 04/18/2020 1326   MCV 91.0 02/20/2021 0125   MCV 85 04/18/2020 1326   MCH 27.9 02/20/2021 0125   MCHC 30.7 02/20/2021 0125   RDW 14.4 02/20/2021 0125   RDW 13.4 04/18/2020 1326   LYMPHSABS 1.8 02/18/2021 1658   MONOABS 0.4 02/18/2021 1658   EOSABS 0.3 02/18/2021 1658   BASOSABS 0.1 02/18/2021 1658    BMET    Component Value Date/Time   NA 139 02/20/2021 0125   NA 141 04/18/2020 1326   K 4.2 02/20/2021 0125   CL 106 02/20/2021 0125   CO2 26 02/20/2021 0125   GLUCOSE 104 (H) 02/20/2021 0125   BUN 13 02/20/2021 0125   BUN 15 04/18/2020 1326   CREATININE 1.01 (H) 02/20/2021 0125   CREATININE 0.98 09/25/2015 0837   CALCIUM 8.9 02/20/2021 0125   GFRNONAA >60 02/20/2021 0125   GFRNONAA 67 09/25/2015 0837   GFRAA 66 04/18/2020 1326   GFRAA 77 09/25/2015 0837    BNP    Component Value Date/Time   BNP 77.9 02/20/2021 0125    ProBNP No results found for: PROBNP  Imaging: DG Chest 2 View  Result Date: 01/29/2021 CLINICAL DATA:  Shortness of breath and fatigue. EXAM: CHEST - 2 VIEW COMPARISON:  Radiograph and chest CTA 12/16/2020 at [facility]. FINDINGS: The cardiomediastinal contours are stable. Occasional areas of atelectasis, left greater than right. Pulmonary vasculature is normal. No consolidation, pleural effusion, or pneumothorax. No acute osseous abnormalities are seen. IMPRESSION: Occasional areas of atelectasis, left greater than right. Electronically Signed   By: [facility] M.D.   On: 01/29/2021 19:02   CT Angio Chest W/Cm &/Or Wo Cm  Result Date: 02/18/2021 CLINICAL DATA:  Recurrent PE, new  onset shortness of breath EXAM: CT ANGIOGRAPHY CHEST WITH CONTRAST TECHNIQUE: Multidetector  CT imaging of the chest was performed using the standard protocol during bolus administration of intravenous contrast. Multiplanar CT image reconstructions and MIPs were obtained to evaluate the vascular anatomy. CONTRAST:  OMNIPAQUE IOHEXOL 350 MG/ML SOLN COMPARISON:  01/29/2021 FINDINGS: Cardiovascular: Satisfactory opacification of the pulmonary arteries to the segmental level. There is nearly occlusive, lobar to segmental embolus throughout the right lung (series 4, image 48, 67, 76). No embolus identified in the left lung.Global cardiomegaly. Mildly elevated RV LV ratio, 1.1-1. Gross enlargement of the main pulmonary artery, measuring up to 4.2 cm in caliber. No pericardial effusion. Mediastinum/Nodes: No enlarged mediastinal, hilar, or axillary lymph nodes. Small hiatal hernia. Thyroid gland, trachea, and esophagus demonstrate no significant findings. Lungs/Pleura: Diffuse mosaic attenuation of the airspaces. No pleural effusion or pneumothorax. Upper Abdomen: No acute abnormality.  Hepatic steatosis. Musculoskeletal: No chest wall abnormality. No acute or significant osseous findings. Review of the MIP images confirms the above findings. IMPRESSION: 1. There is nearly occlusive, lobar to segmental embolus throughout the right lung. No embolus identified in the left lung. 2. Global cardiomegaly. Mildly elevated RV LV ratio, 1.1-1. Gross enlargement of the main pulmonary artery. Findings are consistent with submassive pulmonary embolus and concerning for right heart strain. Correlate with echocardiographic findings. 3. Diffuse mosaic attenuation of the airspaces, suggestive of small airways disease and or chronic thromboembolic disease. 4. Hepatic steatosis. Call report request was placed at the time of interpretation. Final communication will be documented. Electronically Signed   By: Jearld Lesch M.D.   On: 02/18/2021 16:36   CT Angio Chest PE W and/or Wo Contrast  Result Date: 01/29/2021 CLINICAL  DATA:  Shortness of breath for 3 days, history of prior COVID-19 infection as well as pulmonary embolus EXAM: CT ANGIOGRAPHY CHEST WITH CONTRAST TECHNIQUE: Multidetector CT imaging of the chest was performed using the standard protocol during bolus administration of intravenous contrast. Multiplanar CT image reconstructions and MIPs were obtained to evaluate the vascular anatomy. CONTRAST:  67mL OMNIPAQUE IOHEXOL 350 MG/ML SOLN COMPARISON:  12/16/2020 FINDINGS: Cardiovascular: Thoracic aorta again demonstrates dilatation to 4.2 cm in the ascending aorta. Normal tapering in the thoracic aortic arch is noted. Descending thoracic aorta is within normal limits. The pulmonary artery is well visualized with a normal branching pattern bilaterally. Previously seen residual right lower lobe thrombus has resolved in the interval from the prior exam. No new pulmonary emboli are noted. Mediastinum/Nodes: Thoracic inlet is within normal limits. No sizable hilar or mediastinal adenopathy is noted. The esophagus as visualized is within normal limits. Lungs/Pleura: Lungs are well aerated bilaterally. No focal infiltrate or sizable effusion is seen. No parenchymal nodules are noted. Previously seen left basilar atelectasis has resolved. Upper Abdomen: Visualized upper abdomen again shows fatty infiltration of the liver. Musculoskeletal: Degenerative changes of the thoracic spine are noted. Review of the MIP images confirms the above findings. IMPRESSION: Resolution of previously seen residual pulmonary emboli within the right pulmonary artery. No new pulmonary emboli are seen. Persistent dilatation of the ascending aorta to 4.2 cm. Recommend annual imaging followup by CTA or MRA. This recommendation follows 2010 ACCF/AHA/AATS/ACR/ASA/SCA/SCAI/SIR/STS/SVM Guidelines for the Diagnosis and Management of Patients with Thoracic Aortic Disease. Circulation. 2010; 121: G401-U272. Aortic aneurysm NOS (ICD10-I71.9) Fatty liver Electronically  Signed   By: Alcide Clever M.D.   On: 01/29/2021 19:57   ECHOCARDIOGRAM COMPLETE  Result Date: 02/19/2021    ECHOCARDIOGRAM REPORT   Patient Name:  Erika Woods Date of Exam: 02/19/2021 Medical Rec #:  [id number]      Height:       65.0 in Accession #:    [id number]     Weight:       306.4 lb Date of Birth:  17-Aug-1965      BSA:          2.371 m Patient Age:    55 years       BP:           144/86 mmHg Patient Gender: F              HR:           80 bpm. Exam Location:  Inpatient Procedure: 2D Echo, Cardiac Doppler and Color Doppler Indications:    I26.02 Pulmonary embolus  History:        Patient has prior history of Echocardiogram examinations, most                 recent 11/09/2020. Risk Factors:Diabetes.  Sonographer:    Roosvelt Maser RDCS Referring Phys: [id number] Cecille Po MELVIN IMPRESSIONS  1. Left ventricular ejection fraction, by estimation, is 60%. The left ventricle has normal function. The left ventricle has no regional wall motion abnormalities. Left ventricular diastolic parameters were normal.  2. Right ventricular systolic function is normal. The right ventricular size is mildly enlarged. There is mildly elevated pulmonary artery systolic pressure. The estimated right ventricular systolic pressure is 37.8 mmHg.  3. The mitral valve is normal in structure. No evidence of mitral valve regurgitation. No evidence of mitral stenosis.  4. Tricuspid valve regurgitation is mild to moderate.  5. The aortic valve is abnormal. Aortic valve regurgitation is mild. Mild aortic valve stenosis. Aortic valve mean gradient measures 14.0 mmHg. Aortic valve Vmax measures 2.47 m/s. The phenotype of forme fruste bicuspid valve is not well shown in this evaluation (biplane assesment). Comparison(s): A prior study was performed on 08/11/2019. No significant change from prior study. No significant aortic dilation seen in this study. FINDINGS  Left Ventricle: Left ventricular ejection fraction, by estimation, is 60%. The left  ventricle has normal function. The left ventricle has no regional wall motion abnormalities. The left ventricular internal cavity size was normal in size. There is no left ventricular hypertrophy. Left ventricular diastolic parameters were normal. Right Ventricle: The right ventricular size is mildly enlarged. Right vetricular wall thickness was not well visualized. Right ventricular systolic function is normal. There is mildly elevated pulmonary artery systolic pressure. The tricuspid regurgitant  velocity is 2.95 m/s, and with an assumed right atrial pressure of 3 mmHg, the estimated right ventricular systolic pressure is 37.8 mmHg. Left Atrium: Left atrial size was normal in size. Right Atrium: Right atrial size was normal in size. Pericardium: Trivial pericardial effusion is present. Presence of pericardial fat pad. Mitral Valve: The mitral valve is normal in structure. No evidence of mitral valve regurgitation. No evidence of mitral valve stenosis. Tricuspid Valve: The tricuspid valve is grossly normal. Tricuspid valve regurgitation is mild to moderate. No evidence of tricuspid stenosis. Aortic Valve: The aortic valve is abnormal. Aortic valve regurgitation is mild. Mild aortic stenosis is present. Aortic valve mean gradient measures 14.0 mmHg. Aortic valve peak gradient measures 24.4 mmHg. Aortic valve area, by VTI measures 1.54 cm. Pulmonic Valve: The pulmonic valve was grossly normal. Pulmonic valve regurgitation is not visualized. No evidence of pulmonic stenosis. Aorta: The aortic root is normal in size and structure. IAS/Shunts: The  atrial septum is grossly normal.  LEFT VENTRICLE PLAX 2D LVIDd:         5.00 cm   Diastology LVIDs:         3.20 cm   LV e' medial:    8 cm/s LV PW:         1.00 cm   LV E/e' medial:  11 LV IVS:        1.00 cm   LV e' lateral:   10 cm/s LVOT diam:     2.10 cm   LV E/e' lateral: 7.6 LV SV:         77 LV SV Index:   33 LVOT Area:     3.46 cm  RIGHT VENTRICLE RV Basal diam:   4.20 cm RV Mid diam:    3.10 cm LEFT ATRIUM             Index        RIGHT ATRIUM           Index LA diam:        4.50 cm 1.90 cm/m   RA Area:     23.50 cm LA Vol (A2C):   75.6 ml 31.88 ml/m  RA Volume:   80.00 ml  33.73 ml/m LA Vol (A4C):   41.7 ml 17.58 ml/m LA Biplane Vol: 57.2 ml 24.12 ml/m  AORTIC VALVE AV Area (Vmax):    1.53 cm AV Area (Vmean):   1.46 cm AV Area (VTI):     1.54 cm AV Vmax:           247.00 cm/s AV Vmean:          176.000 cm/s AV VTI:            0.501 m AV Peak Grad:      24.4 mmHg AV Mean Grad:      14.0 mmHg LVOT Vmax:         109.00 cm/s LVOT Vmean:        74.000 cm/s LVOT VTI:          0.223 m LVOT/AV VTI ratio: 0.45  AORTA Ao Root diam: 3.10 cm MITRAL VALVE                TRICUSPID VALVE MV Area (PHT): 4.19 cm     TR Peak grad:   34.8 mmHg MV Decel Time: 181 msec     TR Vmax:        295.00 cm/s MV E velocity: 77.10 cm/s MV A velocity: 104.00 cm/s  SHUNTS MV E/A ratio:  0.74         Systemic VTI:  0.22 m                             Systemic Diam: 2.10 cm Riley Lam MD Electronically signed by Riley Lam MD Signature Date/Time: 02/19/2021/2:34:55 PM    Final       No flowsheet data found.  No results found for: NITRICOXIDE      Assessment & Plan:   Acute pulmonary embolism ([name])     Obesity, Class III, BMI 40-49.9 (morbid obesity) ([name]) Healthy weight loss  Recurrent pulmonary emboli ([name]) Patient with recurrent pulmonary embolism after a break in therapy with Xarelto.  Patient has now been restarted on anticoagulation and appears to be doing well.  Patient education was given.  Plan  Patient Instructions  Continue on Xarelto starter pack then transition to Xarelto  20mg  daily.  Activity as tolerated.  Follow up in 4 weeks and As needed .  Please contact office for sooner follow up if symptoms do not improve or worsen or seek emergency care           , NP 02/22/2021

## 2021-02-22 NOTE — Patient Instructions (Signed)
Continue on Xarelto starter pack then transition to Xarelto 20mg  daily.  Activity as tolerated.  Follow up in 4 weeks and As needed .  Please contact office for sooner follow up if symptoms do not improve or worsen or seek emergency care

## 2021-02-22 NOTE — Assessment & Plan Note (Signed)
Healthy weight loss 

## 2021-02-22 NOTE — Telephone Encounter (Signed)
Order has been sent in to have the POC picked up per pts request.  Nothing further is needed.

## 2021-02-22 NOTE — Assessment & Plan Note (Signed)
Patient with recurrent pulmonary embolism after a break in therapy with Xarelto.  Patient has now been restarted on anticoagulation and appears to be doing well.  Patient education was given.  Plan  Patient Instructions  Continue on Xarelto starter pack then transition to Xarelto 20mg  daily.  Activity as tolerated.  Follow up in 4 weeks and As needed .  Please contact office for sooner follow up if symptoms do not improve or worsen or seek emergency care

## 2021-02-25 ENCOUNTER — Telehealth (HOSPITAL_COMMUNITY): Payer: Self-pay | Admitting: Pharmacist

## 2021-02-25 ENCOUNTER — Other Ambulatory Visit (HOSPITAL_COMMUNITY): Payer: Self-pay

## 2021-02-25 NOTE — Telephone Encounter (Signed)
Pharmacy Transitions of Care Follow-up Telephone Call  Date of discharge: 02/21/21  Discharge Diagnosis: PE  How have you been since you were released from the hospital? Good   Medication changes made at discharge:  - START: Xarelto starter pack  - STOPPED: None  - CHANGED: None  Medication changes verified by the patient? YEs (Yes/No)    Medication Accessibility:  Home Pharmacy: NA   Was the patient provided with refills on discharged medications? No   Medication Review: RIVAROXABAN (XARELTO)  Rivaroxaban 15 mg BID initiated on . Will switch to 20 mg daily after 21 days (DATE 03/13/21).   - Discussed importance of taking medication with food and around the same time everyday  - Reviewed potential DDIs with patient  - Advised patient of medications to avoid (NSAIDs, ASA)  - Educated that Tylenol (acetaminophen) will be the preferred analgesic to prevent risk of bleeding  - Emphasized importance of monitoring for signs and symptoms of bleeding (abnormal bruising, prolonged bleeding, nose bleeds, bleeding from gums, discolored urine, black tarry stools)  - Advised patient to alert all providers of anticoagulation therapy prior to starting a new medication or having a procedure    Follow-up Appointments:  PCP Hospital f/u appt confirmed? Already had  Specialist Hospital f/u appt confirmed? Already had  If their condition worsens, is the pt aware to call PCP or go to the Emergency Dept.? Yes  Final Patient Assessment: Patient states she is feeling good.  Reviewed medications and side effects.

## 2021-03-13 ENCOUNTER — Other Ambulatory Visit: Payer: Self-pay | Admitting: Physician Assistant

## 2021-03-13 DIAGNOSIS — R1310 Dysphagia, unspecified: Secondary | ICD-10-CM

## 2021-03-19 ENCOUNTER — Other Ambulatory Visit: Payer: Commercial Managed Care - PPO

## 2021-03-19 ENCOUNTER — Ambulatory Visit
Admission: RE | Admit: 2021-03-19 | Discharge: 2021-03-19 | Disposition: A | Discharge status: Home / Self Care | Payer: Commercial Managed Care - PPO | Source: Ambulatory Visit | Attending: Physician Assistant | Admitting: Physician Assistant

## 2021-03-19 DIAGNOSIS — R1310 Dysphagia, unspecified: Secondary | ICD-10-CM

## 2021-03-20 ENCOUNTER — Other Ambulatory Visit: Payer: Self-pay | Admitting: Gastroenterology

## 2021-03-20 ENCOUNTER — Other Ambulatory Visit: Payer: Commercial Managed Care - PPO

## 2021-03-28 ENCOUNTER — Other Ambulatory Visit: Payer: Self-pay

## 2021-03-28 ENCOUNTER — Ambulatory Visit (INDEPENDENT_AMBULATORY_CARE_PROVIDER_SITE_OTHER): Payer: Commercial Managed Care - PPO | Admitting: Adult Health

## 2021-03-28 ENCOUNTER — Encounter: Payer: Self-pay | Admitting: Adult Health

## 2021-03-28 VITALS — BP 110/80 | HR 102 | Temp 98.3°F | Ht 65.0 in | Wt 304.8 lb

## 2021-03-28 DIAGNOSIS — G4730 Sleep apnea, unspecified: Secondary | ICD-10-CM

## 2021-03-28 DIAGNOSIS — I2699 Other pulmonary embolism without acute cor pulmonale: Secondary | ICD-10-CM

## 2021-03-28 DIAGNOSIS — E66813 Obesity, class 3: Secondary | ICD-10-CM

## 2021-03-28 NOTE — Assessment & Plan Note (Signed)
History of sleep apnea-excellent control compliance on nocturnal CPAP.  Plan  Patient Instructions  Continue on Xarelto 20mg  daily.  No NSAIDS.  Report any bleeding .  Activity as tolerated.  2 D ECho in January 2023. Continue on CPAP At bedtime   Work healthy weight loss  Do not drive if sleepy .  VQ scan in January 2023 as planned.  Follow up in 2 months with Dr. February 2023 and As needed  (30 min slot)  Please contact office for sooner follow up if symptoms do not improve or worsen or seek emergency care

## 2021-03-28 NOTE — Addendum Note (Signed)
Addended by: Delrae Rend on: 03/28/2021 04:56 PM   Modules accepted: Orders

## 2021-03-28 NOTE — Patient Instructions (Addendum)
Continue on Xarelto 20mg  daily.  No NSAIDS.  Report any bleeding .  Activity as tolerated.  2 D ECho in January 2023. Continue on CPAP At bedtime   Work healthy weight loss  Do not drive if sleepy .  VQ scan in January 2023 as planned.  Follow up in 2 months with Dr. February 2023 and As needed  (30 min slot)  Please contact office for sooner follow up if symptoms do not improve or worsen or seek emergency care

## 2021-03-28 NOTE — Assessment & Plan Note (Signed)
Recurrent PE-patient is continue on lifelong anticoagulation therapy 2D echo in October did show mild RV enlargement and elevated pulmonary artery pressure.  Repeat echo in 3 months January 2023 Patient has a planned VQ scan as she has had recurrent PE in the past.-Rule out CETPH Patient education on anticoagulation therapy.  Patient is advised to avoid nonsteroidals.  Report any signs of bleeding.  Plan  Patient Instructions  Continue on Xarelto 20mg  daily.  No NSAIDS.  Report any bleeding .  Activity as tolerated.  2 D ECho in January 2023. Continue on CPAP At bedtime   Work healthy weight loss  Do not drive if sleepy .  VQ scan in January 2023 as planned.  Follow up in 2 months with Dr. February 2023 and As needed  (30 min slot)  Please contact office for sooner follow up if symptoms do not improve or worsen or seek emergency care

## 2021-03-28 NOTE — Progress Notes (Signed)
@Patient  ID: Erika Woods, female    DOB: 10-15-1965, 55 y.o.   MRN: [id number]  Chief Complaint  Patient presents with   Follow-up    Referring provider: [name], MD  HPI: 55 year old female never smoker seen for pulmonary consult January 30, 2021 for recurrent PE (first PE 2019, second PE in July 2020 and third PE June 2022 patient is on lifelong anticoagulation), recurrent PE October 2022 Previous work-up with hypercoagulable panel, prothrombin gene mutation and factor V Leiden were all negative during work-up in July 2020 while on anticoagulation Medical history significant for sleep apnea on CPAP, diabetes, fatty liver, nonalcoholic cirrhosis  TEST/EVENTS :  2D echo November 09, 2020 showed EF at 60-65%, right ventricular systolic function is normal.  RV size is moderately enlarged.  03/28/2021 Follow up : Recurrent PE Patient returns for a 1 month follow-up.  Patient was seen last month for post hospital follow-up.  Patient has been seen previously with increased shortness of breath and exertional hypoxemia.  She had had a break in Xarelto therapy for 5 days due to insurance issues.  CT chest showed nearly occlusive lobar to segmental embolus throughout the right lung. Patient was hospitalized.  Treated with heparin and transition to Lovenox and then onto Xarelto.  2D echo during hospital stay showed preserved EF at 60%, mildly elevated pulmonary artery systolic pressure and mildly enlarged right ventricle. Since last visit patient is doing well, feeling better. Decreased dyspnea. No chest pain or hemoptysis. She remains on Xarelto.  She denies any known bleeding. Patient education on anticoagulation.   Has OSA on CPAP. Has been on CPAP since 2004. Wears CPAP every night for 7 hrs .  Has CPAP APP on phone. Wears nasal CPAP mask. Feels rested, can not sleep with out  CPAP download over the last 30 days shows excellent compliance with 100% usage.  Daily average usage at 8  hours.  Patient is on auto CPAP 4 to 16 cm H2O.  AHI 0.8.     Allergies  Allergen Reactions   Sulfa Antibiotics Hives    Immunization History  Administered Date(s) Administered   Influenza,inj,Quad PF,6+ Mos 03/14/2019, 03/21/2019, 02/19/2021   Influenza-Unspecified 01/12/2018   PFIZER(Purple Top)SARS-COV-2 Vaccination 09/20/2019, 10/13/2019    Past Medical History:  Diagnosis Date   Acute pulmonary embolism ([place]) 11/11/2018   Acute pulmonary embolism with acute cor pulmonale ([place]) 09/16/2017   Acute superficial venous thrombosis of left lower extremity    Aortic insufficiency    Echocardiogram 08/2019: prob bicuspid AoV, mild to mod AI, mild AS (mean 12 mmHg), EF 55-60, no RWMA, Gr 1 DD, GLS -22.2%, normal RVSF, Ao Root 39 mm   Coronary CTA    Coronary CTA 08/2019: Calcium score 0, no evidence of CAD, ascending aorta 4 cm   Diabetes mellitus without complication ([name])    DM (diabetes mellitus) ([place]) 08/18/2011   Impaired fasting glucose    Mood disorder ([place]) 08/18/2011   Obesity, Class III, BMI 40-49.9 (morbid obesity) ([place]) 09/16/2017   Obstructive sleep apnea on CPAP    Osteoarthritis of knee    Personal history of pulmonary embolism    PTSD (post-traumatic stress disorder) 08/18/2011   Pulmonary emboli ([place]) 09/15/2017   Renal disorder    kideny stones   Restless leg syndrome    Severe recurrent major depression without psychotic features ([place]) 02/17/2018   Sleep apnea 08/18/2011   Thoracic aortic aneurysm    Thoracic aortic aneurysm without rupture  Tobacco History: Social History   Tobacco Use  Smoking Status Never  Smokeless Tobacco Never   Counseling given: Not Answered   Outpatient Medications Prior to Visit  Medication Sig Dispense Refill   albuterol (VENTOLIN HFA) 108 (90 Base) MCG/ACT inhaler Inhale 2 puffs into the lungs every 6 (six) hours as needed for wheezing or shortness of breath.     cloNIDine (CATAPRES) 0.1 MG tablet Take 0.1-0.3 mg by mouth at bedtime.  Taking 0.3 mg at bedtime now     dextromethorphan (DELSYM) 30 MG/5ML liquid Take 15 mg by mouth as needed for cough.     esomeprazole (NEXIUM) 40 MG capsule 1 capsule     gabapentin (NEURONTIN) 300 MG capsule Take 300-600 mg by mouth See admin instructions. Taking 2 capsules (600 mg) in the am and 300 mg at bedtime     guaiFENesin-dextromethorphan (ROBITUSSIN DM) 100-10 MG/5ML syrup Take 5 mLs by mouth every 4 (four) hours as needed for cough.     L-Methylfolate 15 MG TABS Take 15 mg by mouth daily.     lamoTRIgine (LAMICTAL) 150 MG tablet Take 150 mg by mouth daily after breakfast.      metFORMIN (GLUCOPHAGE) 500 MG tablet Take 500 mg by mouth 2 (two) times daily with a meal.      oxymetazoline (AFRIN) 0.05 % nasal spray Place 1 spray into both nostrils 2 (two) times daily as needed for congestion.     pseudoephedrine (SUDAFED) 30 MG tablet Take 30 mg by mouth every 4 (four) hours as needed for congestion.     rivaroxaban (XARELTO) 20 MG TABS tablet Take 1 tablet (20 mg total) by mouth daily with supper. 30 tablet 0   rOPINIRole (REQUIP) 4 MG tablet Take 8-12 mg by mouth at bedtime.     TRINTELLIX 20 MG TABS tablet Take 20 mg by mouth daily.     pantoprazole (PROTONIX) 40 MG tablet Take 40 mg by mouth daily as needed for indigestion.     famotidine (PEPCID) 20 MG tablet Take 20 mg by mouth 2 (two) times daily.     RIVAROXABAN (XARELTO) VTE STARTER PACK (15 & 20 MG) Take one 15 mg tablet by mouth 2 (two) times daily with a meal for 21 days then decrease to 1 tablet 20mg  once daily with meal. (Patient not taking: Reported on 03/28/2021) 51 each 0   No facility-administered medications prior to visit.     Review of Systems:   Constitutional:   No  weight loss, night sweats,  Fevers, chills, fatigue, or  lassitude.  HEENT:   No headaches,  Difficulty swallowing,  Tooth/dental problems, or  Sore throat,                No sneezing, itching, ear ache, nasal congestion, post nasal drip,   CV:   No chest pain,  Orthopnea, PND, swelling in lower extremities, anasarca, dizziness, palpitations, syncope.   GI  No heartburn, indigestion, abdominal pain, nausea, vomiting, diarrhea, change in bowel habits, loss of appetite, bloody stools.   Resp: No shortness of breath with exertion or at rest.  No excess mucus, no productive cough,  No non-productive cough,  No coughing up of blood.  No change in color of mucus.  No wheezing.  No chest wall deformity  Skin: no rash or lesions.  GU: no dysuria, change in color of urine, no urgency or frequency.  No flank pain, no hematuria   MS:  No joint pain or swelling.  No  decreased range of motion.  No back pain.    Physical Exam  BP 110/80 (BP Location: Left Arm, Patient Position: Sitting, Cuff Size: Large)   Pulse (!) 102   Temp 98.3 F (36.8 C) (Oral)   Ht 5\' 5"  (1.651 m)   Wt (!) 304 lb 12.8 oz (138.3 kg)   LMP 01/29/2013   SpO2 94%   BMI 50.72 kg/m   GEN: A/Ox3; pleasant , NAD, well nourished    HEENT:  [place]/AT,   NOSE-clear, THROAT-clear, no lesions, no postnasal drip or exudate noted.   NECK:  Supple w/ fair ROM; no JVD; normal carotid impulses w/o bruits; no thyromegaly or nodules palpated; no lymphadenopathy.    RESP  Clear  P & A; w/o, wheezes/ rales/ or rhonchi. no accessory muscle use, no dullness to percussion  CARD:  RRR, no m/r/g, no peripheral edema, pulses intact, no cyanosis or clubbing.  GI:   Soft & nt; nml bowel sounds; no organomegaly or masses detected.   Musco: Warm bil, no deformities or joint swelling noted.   Neuro: alert, no focal deficits noted.    Skin: Warm, no lesions or rashes    Lab Results:  CBC    Component Value Date/Time   WBC 5.4 02/20/2021 0125   RBC 4.12 02/20/2021 0125   HGB 11.5 (L) 02/20/2021 0125   HGB 12.5 04/18/2020 1326   HCT 37.5 02/20/2021 0125   HCT 37.7 04/18/2020 1326   PLT 210 02/20/2021 0125   PLT 265 04/18/2020 1326   MCV 91.0 02/20/2021 0125   MCV 85 04/18/2020  1326   MCH 27.9 02/20/2021 0125   MCHC 30.7 02/20/2021 0125   RDW 14.4 02/20/2021 0125   RDW 13.4 04/18/2020 1326   LYMPHSABS 1.8 02/18/2021 1658   MONOABS 0.4 02/18/2021 1658   EOSABS 0.3 02/18/2021 1658   BASOSABS 0.1 02/18/2021 1658    BMET    Component Value Date/Time   NA 139 02/20/2021 0125   NA 141 04/18/2020 1326   K 4.2 02/20/2021 0125   CL 106 02/20/2021 0125   CO2 26 02/20/2021 0125   GLUCOSE 104 (H) 02/20/2021 0125   BUN 13 02/20/2021 0125   BUN 15 04/18/2020 1326   CREATININE 1.01 (H) 02/20/2021 0125   CREATININE 0.98 09/25/2015 0837   CALCIUM 8.9 02/20/2021 0125   GFRNONAA >60 02/20/2021 0125   GFRNONAA 67 09/25/2015 0837   GFRAA 66 04/18/2020 1326   GFRAA 77 09/25/2015 0837    BNP    Component Value Date/Time   BNP 77.9 02/20/2021 0125    ProBNP No results found for: PROBNP  Imaging: DG ESOPHAGUS W DOUBLE CM (HD)  Result Date: 03/19/2021 CLINICAL DATA:  55 year old female with dysphagia with solid food sticking in the throat with occasional food regurgitation. EXAM: ESOPHOGRAM / BARIUM SWALLOW / BARIUM TABLET STUDY TECHNIQUE: Combined double contrast and single contrast examination performed using effervescent crystals, thick barium liquid, and thin barium liquid. The patient was observed with fluoroscopy swallowing a 13 mm barium sulphate tablet. FLUOROSCOPY TIME:  Fluoroscopy Time:  3 minutes 6 seconds Radiation Exposure Index (if provided by the fluoroscopic device): 71 mGy Number of Acquired Spot Images: 5 COMPARISON:  02/18/2021 chest CT angiogram. FINDINGS: Normal oral and pharyngeal phases of swallowing, with no laryngeal penetration or tracheobronchial aspiration. No significant barium retention in the pharynx. No evidence of pharyngeal mass, stricture or diverticulum. No significant cricopharyngeus muscle dysfunction. Normal esophageal motility. Small to moderate hiatal hernia. Mild gastroesophageal reflux elicited  to the level of the lower thoracic  esophagus with water siphon test. Normal esophageal distensibility, with no evidence of esophageal stricture. The barium tablet traversed the esophagus into the stomach without delay. There is a slightly irregular eccentric luminal contour in the lower thoracic esophagus just above the esophagogastric junction best seen on the prone swallow sequence, without discrete mass or luminal narrowing. IMPRESSION: 1. Small to moderate hiatal hernia. Mild gastroesophageal reflux elicited. 2. No evidence of esophageal stricture. 3. Slightly irregular eccentric luminal contour in the lower thoracic esophagus just above the esophagogastric junction, without a discrete mass. Upper endoscopic correlation is advised to exclude Barrett's esophagus or neoplasm. Electronically Signed   By: Ilona Sorrel M.D.   On: 03/19/2021 12:33      No flowsheet data found.  No results found for: NITRICOXIDE      Assessment & Plan:   Pulmonary emboli ([place]) Recurrent PE-patient is continue on lifelong anticoagulation therapy 2D echo in October did show mild RV enlargement and elevated pulmonary artery pressure.  Repeat echo in 3 months January 2023 Patient has a planned VQ scan as she has had recurrent PE in the past.-Rule out [place] Patient education on anticoagulation therapy.  Patient is advised to avoid nonsteroidals.  Report any signs of bleeding.  Plan  Patient Instructions  Continue on Xarelto 20mg  daily.  No NSAIDS.  Report any bleeding .  Activity as tolerated.  2 D ECho in January 2023. Continue on CPAP At bedtime   Work healthy weight loss  Do not drive if sleepy .  VQ scan in January 2023 as planned.  Follow up in 2 months with Dr. Silas Flood and As needed  (30 min slot)  Please contact office for sooner follow up if symptoms do not improve or worsen or seek emergency care         Sleep apnea History of sleep apnea-excellent control compliance on nocturnal CPAP.  Plan  Patient Instructions   Continue on Xarelto 20mg  daily.  No NSAIDS.  Report any bleeding .  Activity as tolerated.  2 D ECho in January 2023. Continue on CPAP At bedtime   Work healthy weight loss  Do not drive if sleepy .  VQ scan in January 2023 as planned.  Follow up in 2 months with Dr. Silas Flood and As needed  (30 min slot)  Please contact office for sooner follow up if symptoms do not improve or worsen or seek emergency care         Obesity, Class III, BMI 40-49.9 (morbid obesity) ([place]) Healthy weight discussed      Rexene Edison, NP 03/28/2021

## 2021-03-28 NOTE — Assessment & Plan Note (Signed)
Healthy weight discussed  ?

## 2021-04-30 ENCOUNTER — Ambulatory Visit (HOSPITAL_COMMUNITY): Payer: Commercial Managed Care - PPO

## 2021-04-30 ENCOUNTER — Other Ambulatory Visit: Payer: Self-pay

## 2021-04-30 ENCOUNTER — Ambulatory Visit (HOSPITAL_COMMUNITY): Payer: Commercial Managed Care - PPO | Attending: Cardiology

## 2021-04-30 DIAGNOSIS — I2609 Other pulmonary embolism with acute cor pulmonale: Secondary | ICD-10-CM

## 2021-04-30 DIAGNOSIS — I2699 Other pulmonary embolism without acute cor pulmonale: Secondary | ICD-10-CM | POA: Insufficient documentation

## 2021-04-30 LAB — ECHOCARDIOGRAM COMPLETE
AR max vel: 1.36 cm2
AV Area VTI: 1.41 cm2
AV Area mean vel: 1.29 cm2
AV Mean grad: 10.7 mmHg
AV Peak grad: 17.4 mmHg
Ao pk vel: 2.08 m/s
Area-P 1/2: 2.82 cm2
P 1/2 time: 657 msec
S' Lateral: 4.1 cm

## 2021-05-02 ENCOUNTER — Telehealth: Payer: Self-pay | Admitting: Adult Health

## 2021-05-02 ENCOUNTER — Other Ambulatory Visit: Payer: Self-pay | Admitting: *Deleted

## 2021-05-02 DIAGNOSIS — I2699 Other pulmonary embolism without acute cor pulmonale: Secondary | ICD-10-CM

## 2021-05-03 NOTE — Telephone Encounter (Signed)
Called and spoke with pt. Pt said she received a call from TP yesterday 12/22 about some recent test results and while TP was speaking with her, she also discussed this with her during that call as well. Pt stated that TP was going to touch base with Celso Amy, PA about this. Nothing further needed.

## 2021-05-17 ENCOUNTER — Telehealth: Payer: Self-pay | Admitting: *Deleted

## 2021-05-17 NOTE — Telephone Encounter (Signed)
Received message from Dr. Valeta Harms that he is working nights and to send message to DOD.  Please advise.  Thank you.  I have a form for this patient who is having an endoscopy at [facility] with Dr. Paulita Fujita on 05/29/2021 to check for Barrett's.  She is on Xarelto for a recent PE.  I spoke with Rexene Edison NP who is currently out of the office and she indicated that the patient cannot be off of the blood thinner and there was conversation about doing a Lovenox bridge.

## 2021-05-17 NOTE — Telephone Encounter (Signed)
Good morning Dr. Tonia Brooms, I have a form for this patient who is having an endoscopy at [facility] with Dr. Dulce Sellar on 05/29/2021 to check for Barrett's.  She is on Xarelto for a recent PE.  I spoke with Rubye Oaks NP who is currently out of the office and she indicated that the patient cannot be off of the blood thinner and there was conversation about doing a Lovenox bridge.  I can fax this form to you if you can provide the best fax number.  Please advise.  Thank you.

## 2021-05-20 ENCOUNTER — Other Ambulatory Visit (HOSPITAL_COMMUNITY): Payer: Commercial Managed Care - PPO

## 2021-05-20 ENCOUNTER — Ambulatory Visit (HOSPITAL_COMMUNITY): Payer: Commercial Managed Care - PPO

## 2021-05-20 MED ORDER — ENOXAPARIN SODIUM 40 MG/0.4ML IJ SOSY: 50.0000 mg | PREFILLED_SYRINGE | Freq: Two times a day (BID) | INTRAMUSCULAR | 0 refills | Status: DC

## 2021-05-20 NOTE — Telephone Encounter (Signed)
Spoke with patient procedure canceled per GI.  Called pharmacy Lovenox canceled.  Patient  aware . Has follow up in office next week.

## 2021-05-24 ENCOUNTER — Other Ambulatory Visit (HOSPITAL_COMMUNITY): Payer: Commercial Managed Care - PPO

## 2021-05-27 ENCOUNTER — Ambulatory Visit (HOSPITAL_COMMUNITY): Admission: RE | Admit: 2021-05-27 | Payer: Commercial Managed Care - PPO | Source: Ambulatory Visit

## 2021-05-28 ENCOUNTER — Ambulatory Visit: Payer: Commercial Managed Care - PPO | Admitting: Adult Health

## 2021-05-29 ENCOUNTER — Other Ambulatory Visit: Payer: Self-pay

## 2021-05-29 ENCOUNTER — Ambulatory Visit (HOSPITAL_COMMUNITY)
Admission: RE | Admit: 2021-05-29 | Discharge: 2021-05-29 | Disposition: A | Discharge status: Home / Self Care | Payer: Commercial Managed Care - PPO | Source: Ambulatory Visit | Attending: Pulmonary Disease | Admitting: Pulmonary Disease

## 2021-05-29 ENCOUNTER — Ambulatory Visit (HOSPITAL_COMMUNITY): Admit: 2021-05-29 | Payer: Commercial Managed Care - PPO | Admitting: Gastroenterology

## 2021-05-29 ENCOUNTER — Encounter (HOSPITAL_COMMUNITY): Payer: Self-pay

## 2021-05-29 DIAGNOSIS — I2699 Other pulmonary embolism without acute cor pulmonale: Secondary | ICD-10-CM | POA: Insufficient documentation

## 2021-05-29 SURGERY — ESOPHAGOGASTRODUODENOSCOPY (EGD) WITH PROPOFOL
Anesthesia: Monitor Anesthesia Care

## 2021-05-29 MED ORDER — TECHNETIUM TO 99M ALBUMIN AGGREGATED
4.4000 | Freq: Once | INTRAVENOUS | Status: AC
  Administered 2021-05-29: 4.4 via INTRAVENOUS

## 2021-05-30 ENCOUNTER — Encounter: Payer: Self-pay | Admitting: Adult Health

## 2021-05-30 ENCOUNTER — Ambulatory Visit (INDEPENDENT_AMBULATORY_CARE_PROVIDER_SITE_OTHER): Payer: Commercial Managed Care - PPO | Admitting: Adult Health

## 2021-05-30 VITALS — BP 110/66 | HR 89 | Temp 98.4°F | Ht 65.0 in | Wt 292.0 lb

## 2021-05-30 DIAGNOSIS — I2699 Other pulmonary embolism without acute cor pulmonale: Secondary | ICD-10-CM

## 2021-05-30 DIAGNOSIS — G4730 Sleep apnea, unspecified: Secondary | ICD-10-CM | POA: Diagnosis not present

## 2021-05-30 LAB — CBC WITH DIFFERENTIAL/PLATELET
Basophils Absolute: 0.1 10*3/uL (ref 0.0–0.1)
Basophils Relative: 0.7 % (ref 0.0–3.0)
Eosinophils Absolute: 0.3 10*3/uL (ref 0.0–0.7)
Eosinophils Relative: 3.4 % (ref 0.0–5.0)
HCT: 39.8 % (ref 36.0–46.0)
Hemoglobin: 12.6 g/dL (ref 12.0–15.0)
Lymphocytes Relative: 28.1 % (ref 12.0–46.0)
Lymphs Abs: 2.2 10*3/uL (ref 0.7–4.0)
MCHC: 31.7 g/dL (ref 30.0–36.0)
MCV: 86.1 fl (ref 78.0–100.0)
Monocytes Absolute: 0.4 10*3/uL (ref 0.1–1.0)
Monocytes Relative: 5.6 % (ref 3.0–12.0)
Neutro Abs: 4.8 10*3/uL (ref 1.4–7.7)
Neutrophils Relative %: 62.2 % (ref 43.0–77.0)
Platelets: 251 10*3/uL (ref 150.0–400.0)
RBC: 4.62 Mil/uL (ref 3.87–5.11)
RDW: 15 % (ref 11.5–15.5)
WBC: 7.7 10*3/uL (ref 4.0–10.5)

## 2021-05-30 LAB — BASIC METABOLIC PANEL
BUN: 25 mg/dL — ABNORMAL HIGH (ref 6–23)
CO2: 28 mEq/L (ref 19–32)
Calcium: 9.4 mg/dL (ref 8.4–10.5)
Chloride: 104 mEq/L (ref 96–112)
Creatinine, Ser: 1.13 mg/dL (ref 0.40–1.20)
GFR: 54.26 mL/min — ABNORMAL LOW (ref >60.00)
Glucose, Bld: 90 mg/dL (ref 70–99)
Potassium: 4.3 mEq/L (ref 3.5–5.1)
Sodium: 140 mEq/L (ref 135–145)

## 2021-05-30 NOTE — Patient Instructions (Signed)
Continue on Xarelto 20mg  daily.  No NSAIDS.  Report any bleeding .  Activity as tolerated.  Continue on CPAP At bedtime   Work healthy weight loss  Do not drive if sleepy .  Follow up in 2 -3 months with Dr. and As needed  (30 min slot)  Please contact office for sooner follow up if symptoms do not improve or worsen or seek emergency care

## 2021-05-30 NOTE — Assessment & Plan Note (Signed)
Patient has had recurrent PE x4.  All have been suspected provoked or break in therapy.  We will continue on Xarelto for now. VQ scan shows normal perfusion so low probability for PE.  Echo last month showed normal RV and normal pulmonary artery systolic pressure. Patient does have some increased fatigue decreased activity out and shortness of breath over the last 4 days.  Low suspicion for PE as VQ scan was negative yesterday and echo last month returned back to normal. Patient is continue on current regimen.  Plan  Patient Instructions  Continue on Xarelto 20mg  daily.  No NSAIDS.  Report any bleeding .  Activity as tolerated.  Continue on CPAP At bedtime   Work healthy weight loss  Do not drive if sleepy .  Follow up in 2 -3 months with Dr. Silas Woods and As needed  (30 min slot)  Please contact office for sooner follow up if symptoms do not improve or worsen or seek emergency care

## 2021-05-30 NOTE — Assessment & Plan Note (Signed)
Excellent control compliance on CPAP.  CPAP download shows daily usage at 8 hours.  She is on auto CPAP 4 to 16 cm H2O.  AHI is 0.7.'  Plan  Patient Instructions  Continue on Xarelto 20mg  daily.  No NSAIDS.  Report any bleeding .  Activity as tolerated.  Continue on CPAP At bedtime   Work healthy weight loss  Do not drive if sleepy .  Follow up in 2 -3 months with Dr. Silas Flood and As needed  (30 min slot)  Please contact office for sooner follow up if symptoms do not improve or worsen or seek emergency care

## 2021-05-30 NOTE — Progress Notes (Signed)
@Patient  ID: Erika Woods, female    DOB: Aug 02, 1965, 56 y.o.   MRN: [id number]  Chief Complaint  Patient presents with   Follow-up    Referring provider: [name], MD  HPI: 56 year old female never smoker seen for pulmonary consult January 30, 2021 for recurrent PE (first PE 2019, second PE in July 2020 and third PE June 2022, fourth PE October 2022 (she is on lifelong anticoagulation. Previous work-up with hypercoagulable panel, prothrombin gene mutation and factor of 5 Leiden were all negative during work-up in July 2020 while on anticoagulation Medical history significant for sleep apnea on CPAP, diabetes, fatty liver, nonalcoholic cirrhosis    TEST/EVENTS :  2D echo November 09, 2020 showed EF at 60-65%, right ventricular systolic function is normal.  RV size is moderately enlarged.  05/30/2021 Follow up : Recurrent PE Patient presents for 19-month follow-up.  Patient has a history as above of recurrent PE x4 most recently in October 2022 when she had a break in therapy while on Xarelto for 5 days due to insurance issues.  CT chest showed nearly occlusive lobar to segmental embolus throughout the right lung.  She was hospitalized.  She was treated with heparin and Lovenox and transition to Xarelto.  Echo showed preserved EF and mildly elevated pulmonary artery systolic pressure and mildly enlarged right ventricle. Patient says she is doing well on Xarelto.  She denies any known bleeding. Endorces good compliance .  Follow-up echo December 2022 showed EF at 60 to 65%, normal pulmonary artery systolic pressure.  VQ scan done May 29, 2021 showed normal perfusion scan.  Chest x-ray yesterday showed clear lungs.  Over the last 4 days , feels tired, fatigued, decreased activity tolerance , some dyspnea with activity. No chest pain or hemoptysis . Left ankle/lower leg twinges. No calf pain, edema or redness.  Working on weight loss. Lost 20lbs with weight watchers.  No fever, no  body aches  Flu shot utd and Covid vaccine x 3 .   Allergies  Allergen Reactions   Sulfa Antibiotics Hives    Immunization History  Administered Date(s) Administered   Hep A / Hep B 06/28/2018, 07/28/2018, 12/21/2018   Influenza,inj,Quad PF,6+ Mos 03/14/2019, 03/21/2019, 02/19/2021   Influenza-Unspecified 01/12/2018   PFIZER(Purple Top)SARS-COV-2 Vaccination 09/20/2019, 10/13/2019   Pneumococcal Polysaccharide-23 02/26/2010    Past Medical History:  Diagnosis Date   Acute pulmonary embolism ([place]) 11/11/2018   Acute pulmonary embolism with acute cor pulmonale ([place]) 09/16/2017   Acute superficial venous thrombosis of left lower extremity    Aortic insufficiency    Echocardiogram 08/2019: prob bicuspid AoV, mild to mod AI, mild AS (mean 12 mmHg), EF 55-60, no RWMA, Gr 1 DD, GLS -22.2%, normal RVSF, Ao Root 39 mm   Coronary CTA    Coronary CTA 08/2019: Calcium score 0, no evidence of CAD, ascending aorta 4 cm   Diabetes mellitus without complication ([place])    DM (diabetes mellitus) ([place]) 08/18/2011   Impaired fasting glucose    Mood disorder ([place]) 08/18/2011   Obesity, Class III, BMI 40-49.9 (morbid obesity) ([place]) 09/16/2017   Obstructive sleep apnea on CPAP    Osteoarthritis of knee    Personal history of pulmonary embolism    PTSD (post-traumatic stress disorder) 08/18/2011   Pulmonary emboli ([place]) 09/15/2017   Renal disorder    kideny stones   Restless leg syndrome    Severe recurrent major depression without psychotic features ([place]) 02/17/2018   Sleep apnea 08/18/2011   Thoracic  aortic aneurysm    Thoracic aortic aneurysm without rupture     Tobacco History: Social History   Tobacco Use  Smoking Status Never  Smokeless Tobacco Never   Counseling given: Not Answered   Outpatient Medications Prior to Visit  Medication Sig Dispense Refill   albuterol (VENTOLIN HFA) 108 (90 Base) MCG/ACT inhaler Inhale 2 puffs into the lungs every 6 (six) hours as needed for wheezing or shortness of  breath.     cloNIDine (CATAPRES) 0.1 MG tablet Take 0.1-0.3 mg by mouth at bedtime. Taking 0.3 mg at bedtime now     dextromethorphan (DELSYM) 30 MG/5ML liquid Take 15 mg by mouth as needed for cough.     esomeprazole (NEXIUM) 40 MG capsule 1 capsule     famotidine (PEPCID) 20 MG tablet Take 20 mg by mouth 2 (two) times daily.     gabapentin (NEURONTIN) 300 MG capsule Take 300-600 mg by mouth See admin instructions. Taking 2 capsules (600 mg) in the am and 300 mg at bedtime     guaiFENesin-dextromethorphan (ROBITUSSIN DM) 100-10 MG/5ML syrup Take 5 mLs by mouth every 4 (four) hours as needed for cough.     lamoTRIgine (LAMICTAL) 150 MG tablet Take 150 mg by mouth daily after breakfast.      metFORMIN (GLUCOPHAGE) 500 MG tablet Take 500 mg by mouth 2 (two) times daily with a meal.      oxymetazoline (AFRIN) 0.05 % nasal spray Place 1 spray into both nostrils 2 (two) times daily as needed for congestion.     pseudoephedrine (SUDAFED) 30 MG tablet Take 30 mg by mouth every 4 (four) hours as needed for congestion.     rivaroxaban (XARELTO) 20 MG TABS tablet Take 1 tablet (20 mg total) by mouth daily with supper. 30 tablet 0   rOPINIRole (REQUIP) 4 MG tablet Take 8-12 mg by mouth at bedtime.     TRINTELLIX 20 MG TABS tablet Take 20 mg by mouth daily.     L-Methylfolate 15 MG TABS Take 15 mg by mouth daily.     No facility-administered medications prior to visit.     Review of Systems:   Constitutional:   No  weight loss, night sweats,  Fevers, chills,  fatigue, or  lassitude.  HEENT:   No headaches,  Difficulty swallowing,  Tooth/dental problems, or  Sore throat,                No sneezing, itching, ear ache, nasal congestion, post nasal drip,   CV:  No chest pain,  Orthopnea, PND, swelling in lower extremities, anasarca, dizziness, palpitations, syncope.   GI  No heartburn, indigestion, abdominal pain, nausea, vomiting, diarrhea, change in bowel habits, loss of appetite, bloody stools.    Resp:   No chest wall deformity  Skin: no rash or lesions.  GU: no dysuria, change in color of urine, no urgency or frequency.  No flank pain, no hematuria   MS:  No joint pain or swelling.  No decreased range of motion.  No back pain.    Physical Exam  BP 110/66 (BP Location: Left Arm, Patient Position: Sitting, Cuff Size: Large)    Pulse 89    Temp 98.4 F (36.9 C) (Oral)    Ht 5\' 5"  (1.651 m)    Wt 292 lb (132.5 kg)    LMP 01/29/2013    SpO2 91%    BMI 48.59 kg/m   GEN: A/Ox3; pleasant , NAD, well nourished    HEENT:  [place]/AT,  NOSE-clear, THROAT-clear, no lesions, no postnasal drip or exudate noted.   NECK:  Supple w/ fair ROM; no JVD; normal carotid impulses w/o bruits; no thyromegaly or nodules palpated; no lymphadenopathy.    RESP  Clear  P & A; w/o, wheezes/ rales/ or rhonchi. no accessory muscle use, no dullness to percussion  CARD:  RRR, no m/r/g, tr  peripheral edema, pulses intact, no cyanosis or clubbing. Neg calf pain , neg Homan's sign.   GI:   Soft & nt; nml bowel sounds; no organomegaly or masses detected.   Musco: Warm bil, no deformities or joint swelling noted.   Neuro: alert, no focal deficits noted.    Skin: Warm, no lesions or rashes    Lab Results:  CBC   BMET   BNP   ProBNP No results found for: PROBNP  Imaging: DG Chest 2 View  Result Date: 05/29/2021 CLINICAL DATA:  Shortness of breath, history of pulmonary embolism EXAM: CHEST - 2 VIEW COMPARISON:  01/29/2021 FINDINGS: Mild enlargement of cardiac silhouette. Tortuous aorta. Pulmonary vascularity normal. Lungs clear. No infiltrate, pleural effusion, or pneumothorax. Osseous structures unremarkable. IMPRESSION: Enlargement of cardiac silhouette. No acute abnormalities. Electronically Signed   By: Lavonia Dana M.D.   On: 05/29/2021 14:34   NM Pulmonary Perfusion  Result Date: 05/29/2021 CLINICAL DATA:  Shortness of breath, question chronic thromboembolic pulmonary hypertension,  history of pulmonary embolism. EXAM: NUCLEAR MEDICINE PERFUSION LUNG SCAN TECHNIQUE: Perfusion images were obtained in multiple projections after intravenous injection of radiopharmaceutical. Ventilation scans intentionally deferred if perfusion scan and chest x-ray adequate for interpretation during COVID 19 epidemic. RADIOPHARMACEUTICALS:  4.4 mCi Tc-58m MAA IV COMPARISON:  CT angio chest 02/18/2021, chest radiograph 05/29/2021 FINDINGS: Normal perfusion lung scan. No perfusion defects. IMPRESSION: Normal perfusion lung scan. Electronically Signed   By: Lavonia Dana M.D.   On: 05/29/2021 14:33      No flowsheet data found.  No results found for: NITRICOXIDE      Assessment & Plan:   Recurrent pulmonary emboli [facility]) Patient has had recurrent PE x4.  All have been suspected provoked or break in therapy.  We will continue on Xarelto for now. VQ scan shows normal perfusion so low probability for PE.  Echo last month showed normal RV and normal pulmonary artery systolic pressure. Patient does have some increased fatigue decreased activity out and shortness of breath over the last 4 days.  Low suspicion for PE as VQ scan was negative yesterday and echo last month returned back to normal. Patient is continue on current regimen.  Plan  Patient Instructions  Continue on Xarelto 20mg  daily.  No NSAIDS.  Report any bleeding .  Activity as tolerated.  Continue on CPAP At bedtime   Work healthy weight loss  Do not drive if sleepy .  Follow up in 2 -3 months with Dr. Silas Flood and As needed  (30 min slot)  Please contact office for sooner follow up if symptoms do not improve or worsen or seek emergency care         Obesity, Class III, BMI 40-49.9 (morbid obesity) ([place]) Healthy weight loss  Sleep apnea Excellent control compliance on CPAP.  CPAP download shows daily usage at 8 hours.  She is on auto CPAP 4 to 16 cm H2O.  AHI is 0.7.'  Plan  Patient Instructions  Continue on Xarelto 20mg   daily.  No NSAIDS.  Report any bleeding .  Activity as tolerated.  Continue on CPAP At bedtime  Work healthy weight loss  Do not drive if sleepy .  Follow up in 2 -3 months with Dr. Silas Flood and As needed  (30 min slot)  Please contact office for sooner follow up if symptoms do not improve or worsen or seek emergency care           Rexene Edison, NP 05/30/2021

## 2021-05-30 NOTE — Assessment & Plan Note (Signed)
Healthy weight loss 

## 2021-06-05 ENCOUNTER — Emergency Department (HOSPITAL_BASED_OUTPATIENT_CLINIC_OR_DEPARTMENT_OTHER)
Admission: EM | Admit: 2021-06-05 | Discharge: 2021-06-05 | Disposition: A | Discharge status: Home / Self Care | Payer: Commercial Managed Care - PPO | Attending: Emergency Medicine | Admitting: Emergency Medicine

## 2021-06-05 ENCOUNTER — Emergency Department (HOSPITAL_BASED_OUTPATIENT_CLINIC_OR_DEPARTMENT_OTHER): Payer: Commercial Managed Care - PPO | Admitting: Radiology

## 2021-06-05 ENCOUNTER — Encounter (HOSPITAL_BASED_OUTPATIENT_CLINIC_OR_DEPARTMENT_OTHER): Payer: Self-pay | Admitting: Obstetrics and Gynecology

## 2021-06-05 ENCOUNTER — Telehealth: Payer: Self-pay | Admitting: Adult Health

## 2021-06-05 ENCOUNTER — Other Ambulatory Visit: Payer: Self-pay

## 2021-06-05 ENCOUNTER — Emergency Department (HOSPITAL_BASED_OUTPATIENT_CLINIC_OR_DEPARTMENT_OTHER): Payer: Commercial Managed Care - PPO

## 2021-06-05 DIAGNOSIS — R079 Chest pain, unspecified: Secondary | ICD-10-CM

## 2021-06-05 DIAGNOSIS — R059 Cough, unspecified: Secondary | ICD-10-CM | POA: Insufficient documentation

## 2021-06-05 DIAGNOSIS — Z20822 Contact with and (suspected) exposure to covid-19: Secondary | ICD-10-CM | POA: Diagnosis not present

## 2021-06-05 DIAGNOSIS — R0602 Shortness of breath: Secondary | ICD-10-CM | POA: Diagnosis not present

## 2021-06-05 DIAGNOSIS — R600 Localized edema: Secondary | ICD-10-CM | POA: Insufficient documentation

## 2021-06-05 DIAGNOSIS — R0789 Other chest pain: Secondary | ICD-10-CM | POA: Insufficient documentation

## 2021-06-05 DIAGNOSIS — K760 Fatty (change of) liver, not elsewhere classified: Secondary | ICD-10-CM | POA: Insufficient documentation

## 2021-06-05 DIAGNOSIS — Z7901 Long term (current) use of anticoagulants: Secondary | ICD-10-CM | POA: Insufficient documentation

## 2021-06-05 LAB — CBC
HCT: 39.4 % (ref 36.0–46.0)
Hemoglobin: 12.2 g/dL (ref 12.0–15.0)
MCH: 27.5 pg (ref 26.0–34.0)
MCHC: 31 g/dL (ref 30.0–36.0)
MCV: 88.9 fL (ref 80.0–100.0)
Platelets: 225 10*3/uL (ref 150–400)
RBC: 4.43 MIL/uL (ref 3.87–5.11)
RDW: 14.6 % (ref 11.5–15.5)
WBC: 6.7 10*3/uL (ref 4.0–10.5)
nRBC: 0 % (ref 0.0–0.2)

## 2021-06-05 LAB — BASIC METABOLIC PANEL
Anion gap: 9 (ref 5–15)
BUN: 24 mg/dL — ABNORMAL HIGH (ref 6–20)
CO2: 26 mmol/L (ref 22–32)
Calcium: 9.5 mg/dL (ref 8.9–10.3)
Chloride: 108 mmol/L (ref 98–111)
Creatinine, Ser: 0.97 mg/dL (ref 0.44–1.00)
GFR, Estimated: >60 mL/min (ref >60)
Glucose, Bld: 97 mg/dL (ref 70–99)
Potassium: 4.6 mmol/L (ref 3.5–5.1)
Sodium: 143 mmol/L (ref 135–145)

## 2021-06-05 LAB — RESP PANEL BY RT-PCR (FLU A&B, COVID) ARPGX2
Influenza A by PCR: NEGATIVE
Influenza B by PCR: NEGATIVE
SARS Coronavirus 2 by RT PCR: NEGATIVE

## 2021-06-05 LAB — TROPONIN I (HIGH SENSITIVITY): Troponin I (High Sensitivity): 3 ng/L (ref <18)

## 2021-06-05 LAB — PREGNANCY, URINE: Preg Test, Ur: NEGATIVE

## 2021-06-05 MED ORDER — IOHEXOL 350 MG/ML SOLN
100.0000 mL | Freq: Once | INTRAVENOUS | Status: AC | PRN
  Administered 2021-06-05: 19:00:00 100 mL via INTRAVENOUS

## 2021-06-05 NOTE — ED Provider Notes (Signed)
[facility]   CSN: [id number] Arrival date & time: 06/05/21  1743     History  Chief Complaint  Patient presents with   Chest Pain    Ralene OkJeanna W Minasyan is a 56 y.o. female.   Chest Pain Associated symptoms: cough   Associated symptoms: no abdominal pain, no back pain, no fatigue and no weakness   Patient presents with chest pain shortness of breath.  Began 2 days ago.  History of pulmonary embolism.  Has had 4 different pulmonary embolisms all of which she was off anticoagulation ordered had a break in her anticoagulation.  No break this time.  States she is lost her voice.  Also having some right-sided chest pain.  This is different than she has had in the past with pulmonary embolisms.  Had been seen about a week ago.  Had some shortness of breath at that time for her pulmonologist.  Had a negative perfusion scan at that time.  This episode feeling worse.  Does not feel like other episodes    Home Medications Prior to Admission medications   Medication Sig Start Date End Date Taking? Authorizing Provider  albuterol (VENTOLIN HFA) 108 (90 Base) MCG/ACT inhaler Inhale 2 puffs into the lungs every 6 (six) hours as needed for wheezing or shortness of breath.    [provider]  cloNIDine (CATAPRES) 0.1 MG tablet Take 0.1-0.3 mg by mouth at bedtime. Taking 0.3 mg at bedtime now 02/01/21   [provider]  dextromethorphan (DELSYM) 30 MG/5ML liquid Take 15 mg by mouth as needed for cough.    [provider]  esomeprazole (NEXIUM) 40 MG capsule 1 capsule 07/06/19   [provider]  famotidine (PEPCID) 20 MG tablet Take 20 mg by mouth 2 (two) times daily. 03/13/21   [provider]  gabapentin (NEURONTIN) 300 MG capsule Take 300-600 mg by mouth See admin instructions. Taking 2 capsules (600 mg) in the am and 300 mg at bedtime 04/01/20   [provider]  guaiFENesin-dextromethorphan (ROBITUSSIN DM)  100-10 MG/5ML syrup Take 5 mLs by mouth every 4 (four) hours as needed for cough.    [provider]  lamoTRIgine (LAMICTAL) 150 MG tablet Take 150 mg by mouth daily after breakfast.  06/30/18   [provider]  metFORMIN (GLUCOPHAGE) 500 MG tablet Take 500 mg by mouth 2 (two) times daily with a meal.  10/28/13   [provider]  oxymetazoline (AFRIN) 0.05 % nasal spray Place 1 spray into both nostrils 2 (two) times daily as needed for congestion.    [provider]  pseudoephedrine (SUDAFED) 30 MG tablet Take 30 mg by mouth every 4 (four) hours as needed for congestion.    [provider]  rivaroxaban (XARELTO) 20 MG TABS tablet Take 1 tablet (20 mg total) by mouth daily with supper. 03/13/21   Leroy SeaSingh, Prashant K, MD  rOPINIRole (REQUIP) 4 MG tablet Take 8-12 mg by mouth at bedtime. 11/03/20   [provider]  TRINTELLIX 20 MG TABS tablet Take 20 mg by mouth daily. 05/13/19   [provider]      Allergies    Sulfa antibiotics    Review of Systems   Review of Systems  Constitutional:  Negative for fatigue.  HENT:         Voice change  Respiratory:  Positive for cough.   Cardiovascular:  Positive for chest pain and leg swelling.  Gastrointestinal:  Negative for abdominal pain.  Musculoskeletal:  Negative for back pain.  Skin:  Negative for wound.  Neurological:  Negative for weakness.   Physical Exam Updated Vital Signs BP 127/70    Pulse (!) 55    Temp 98.3 F (36.8 C)    Resp 18    Ht 5\' 5"  (1.651 m)    Wt 130.6 kg    LMP 01/29/2013    SpO2 99%    BMI 47.93 kg/m  Physical Exam Vitals and nursing note reviewed.  Cardiovascular:     Rate and Rhythm: Normal rate and regular rhythm.  Pulmonary:     Breath sounds: No wheezing, rhonchi or rales.  Chest:     Chest wall: Tenderness present.     Comments: Right parasternal tenderness.  No rash. Musculoskeletal:     Right lower leg: Edema present.     Left lower leg: Edema  present.  Skin:    General: Skin is warm.     Capillary Refill: Capillary refill takes less than 2 seconds.  Neurological:     Mental Status: She is alert.    ED Results / Procedures / Treatments   Labs (all labs ordered are listed, but only abnormal results are displayed) Labs Reviewed  BASIC METABOLIC PANEL - Abnormal; Notable for the following components:      Result Value   BUN 24 (*)    All other components within normal limits  RESP PANEL BY RT-PCR (FLU A&B, COVID) ARPGX2  CBC  PREGNANCY, URINE  TROPONIN I (HIGH SENSITIVITY)    EKG EKG Interpretation  Date/Time:  Wednesday June 05 2021 17:50:30 EST Ventricular Rate:  61 PR Interval:  157 QRS Duration: 147 QT Interval:  427 QTC Calculation: 431 R Axis:   -83 Text Interpretation: Sinus rhythm RBBB and LAFB Confirmed by [id number] [phone]) on 06/05/2021 6:12:47 PM  Radiology DG Chest 2 View  Result Date: 06/05/2021 CLINICAL DATA:  Chest pain EXAM: CHEST - 2 VIEW COMPARISON:  05/29/2021 FINDINGS: The heart size and mediastinal contours are within normal limits. Both lungs are clear. The visualized skeletal structures are unremarkable. IMPRESSION: No active cardiopulmonary disease. Electronically Signed   By: 05/31/2021 M.D.   On: 06/05/2021 18:23   CT Angio Chest PE W and/or Wo Contrast  Result Date: 06/05/2021 CLINICAL DATA:  Pulmonary embolism. EXAM: CT ANGIOGRAPHY CHEST WITH CONTRAST TECHNIQUE: Multidetector CT imaging of the chest was performed using the standard protocol during bolus administration of intravenous contrast. Multiplanar CT image reconstructions and MIPs were obtained to evaluate the vascular anatomy. RADIATION DOSE REDUCTION: This exam was performed according to the departmental dose-optimization program which includes automated exposure control, adjustment of the mA and/or kV according to patient size and/or use of iterative reconstruction technique. CONTRAST:  06/07/2021 OMNIPAQUE IOHEXOL 350  MG/ML SOLN COMPARISON:  CT status chest CT dated 02/18/2021 and radiograph dated 06/05/2021. FINDINGS: Cardiovascular: Borderline cardiomegaly. No pericardial effusion. The thoracic aorta is unremarkable. The origins of the great vessels of the aortic arch appear patent. Mildly dilated main pulmonary trunk suggestive of pulmonary hypertension. Evaluation of the pulmonary arteries is limited due to respiratory motion artifact and suboptimal opacification and timing of the contrast. No large or central pulmonary artery embolus identified. Mediastinum/Nodes: No hilar or mediastinal adenopathy. Small hiatal hernia. The esophagus and the thyroid gland are grossly unremarkable. No mediastinal fluid collection. Lungs/Pleura: There is diffuse patchy ground-glass density throughout the lungs, likely atelectasis. No focal consolidation, pleural effusion, pneumothorax. The central airways are patent. Upper Abdomen: Fatty liver.  Musculoskeletal: Degenerative changes of the spine. No acute osseous pathology. Review of the MIP images confirms the above findings. IMPRESSION: 1. No CT evidence of central pulmonary artery embolus. 2. Borderline cardiomegaly with mild dilatation of the main pulmonary trunk suggestive of pulmonary hypertension. 3. Fatty liver. Electronically Signed   By: Elgie Collard M.D.   On: 06/05/2021 19:53    Procedures Procedures    Medications Ordered in ED Medications  iohexol (OMNIPAQUE) 350 MG/ML injection 100 mL (100 mLs Intravenous Contrast Given 06/05/21 1927)    ED Course/ Medical Decision Making/ A&P                           Medical Decision Making Amount and/or Complexity of Data Reviewed External Data Reviewed: notes.    Details: Previous pulmonary notes Labs: ordered. Radiology: ordered and independent interpretation performed. Decision-making details documented in ED Course. ECG/medicine tests: independent interpretation performed. Decision-making details documented in ED  Course.  Risk Prescription drug management.  Patient has a history of pulmonary embolisms.  Has had 4 different pulmonary embolisms.  None of which she had been on anticoagulation for, a week ago had a perfusion scan that was reassuring.  However over the last couple days worsening shortness of breath.  Right-sided chest pain.  States she is worried it is another pulmonary embolism.  Mild voice change.  No fevers.  States does not really feel like other pulmonary embolisms.  No new swelling in her legs.  EKG independently interpreted by me and reassuring.  Negative COVID and flu testing.  CT angiography done due to high risk.  Negative for pulmonary embolism.  Discussed with patient.  Will discharge home.  Doubt cardiac ischemia.  Doubt pneumonia.  Initial differential diagnosis for chest pain with history of pulm embolism as long includes life-threatening conditions such as pneumothorax, pulm embolism, cardiac ischemia.        Final Clinical Impression(s) / ED Diagnoses Final diagnoses:  Nonspecific chest pain    Rx / DC Orders ED Discharge Orders     None         Benjiman Core, MD 06/06/21 1235

## 2021-06-05 NOTE — ED Notes (Signed)
Sob and cp that started yesterday  , has had a dry cough no n/v some sob when she exerts herself

## 2021-06-05 NOTE — ED Notes (Signed)
Pt ambulated to [facility]

## 2021-06-05 NOTE — ED Triage Notes (Signed)
Patient reports to the ER for Chest pain and ShOB. Patient reports she has a hx of Pe's and she called her pulmonologist and was told to come be assessed for PE or other chest complications.

## 2021-06-05 NOTE — Telephone Encounter (Signed)
Called and spoke with pt to see if she was still taking her Xarelto and pt said that she was. Pt said that she was not feeling right stated that she has been having some labored breathing, has a dry cough, O2 sats highest at 93%, and also has had some chest pain to the right of her sternum.  Stated to her due to her history and since she was not feeling right that it would be best for her to go to emergency room to be evaluated. Pt verbalized understanding.nothing further needed.

## 2021-06-06 ENCOUNTER — Telehealth (INDEPENDENT_AMBULATORY_CARE_PROVIDER_SITE_OTHER): Payer: Commercial Managed Care - PPO | Admitting: Adult Health

## 2021-06-06 ENCOUNTER — Ambulatory Visit: Payer: Commercial Managed Care - PPO | Admitting: Adult Health

## 2021-06-06 ENCOUNTER — Encounter: Payer: Self-pay | Admitting: Adult Health

## 2021-06-06 DIAGNOSIS — I2699 Other pulmonary embolism without acute cor pulmonale: Secondary | ICD-10-CM | POA: Diagnosis not present

## 2021-06-06 DIAGNOSIS — G4730 Sleep apnea, unspecified: Secondary | ICD-10-CM

## 2021-06-06 DIAGNOSIS — R058 Other specified cough: Secondary | ICD-10-CM | POA: Diagnosis not present

## 2021-06-06 MED ORDER — PREDNISONE 10 MG PO TABS: ORAL_TABLET | ORAL | 0 refills | Status: DC

## 2021-06-06 NOTE — Progress Notes (Signed)
Virtual Visit via Video Note  I connected with Erika Woods on 06/06/21 at  2:00 PM EST by a video enabled telemedicine application and verified that I am speaking with the correct person using two identifiers.  Location: Patient: Home  Provider: Office    I discussed the limitations of evaluation and management by telemedicine and the availability of in person appointments. The patient expressed understanding and agreed to proceed.  History of Present Illness: 56 yo female never smoker seen for pulmonary consult 01/30/21 for recurrent PE (First PE 2019, second PE 11/2018, and third PE 10/2020, fourth PE 02/2021) . She is on lifelong anticoagulation.  Previous work up  with hypercoagulable panel were all negative while on anticoagulation.  Medical history significant for sleep apnea on CPAP , DM , FLD-non-alcoholic cirrhosis.   Today's video visit is for an ER follow up . Patient was seen in office last week for follow up . Discussed recent test with VQ 05/29/21 with normal perfusion scan and echo 04/2021 normal PAP and nml EF , RV. Over last week she was feeling tired, low energy and stamina . Decreased activity tolerance and increased DOE. Labs showed normal CBC. She went to ER on 06/05/21 , CT chest neg PE . Shows mild patchy GGO suspected atelectasis.  Says she continued to have ongoing dyspnea , intermittent wheezing and chest tightness. No fever, no discolored mucus .  Troponin was neg. Flu and covid neg.   Covid 19 infection with viral symptoms 10/2020, and 05/2019 . Looking back at CT scan over last 6 months , notable for mosaic attentuation is noted bilaterally. CT chest yesterday with areas of ground glass bilaterally.   Past Medical History:  Diagnosis Date   Acute pulmonary embolism ([place]) 11/11/2018   Acute pulmonary embolism with acute cor pulmonale ([place]) 09/16/2017   Acute superficial venous thrombosis of left lower extremity    Aortic insufficiency    Echocardiogram 08/2019: prob  bicuspid AoV, mild to mod AI, mild AS (mean 12 mmHg), EF 55-60, no RWMA, Gr 1 DD, GLS -22.2%, normal RVSF, Ao Root 39 mm   Coronary CTA    Coronary CTA 08/2019: Calcium score 0, no evidence of CAD, ascending aorta 4 cm   Diabetes mellitus without complication ([name])    DM (diabetes mellitus) ([place]) 08/18/2011   Impaired fasting glucose    Mood disorder ([place]) 08/18/2011   Obesity, Class III, BMI 40-49.9 (morbid obesity) ([place]) 09/16/2017   Obstructive sleep apnea on CPAP    Osteoarthritis of knee    Personal history of pulmonary embolism    PTSD (post-traumatic stress disorder) 08/18/2011   Pulmonary emboli ([place]) 09/15/2017   Renal disorder    kideny stones   Restless leg syndrome    Severe recurrent major depression without psychotic features ([place]) 02/17/2018   Sleep apnea 08/18/2011   Thoracic aortic aneurysm    Thoracic aortic aneurysm without rupture     Current Outpatient Medications on File Prior to Visit  Medication Sig Dispense Refill   albuterol (VENTOLIN HFA) 108 (90 Base) MCG/ACT inhaler Inhale 2 puffs into the lungs every 6 (six) hours as needed for wheezing or shortness of breath.     cloNIDine (CATAPRES) 0.1 MG tablet Take 0.1-0.3 mg by mouth at bedtime. Taking 0.3 mg at bedtime now     dextromethorphan (DELSYM) 30 MG/5ML liquid Take 15 mg by mouth as needed for cough.     esomeprazole (NEXIUM) 40 MG capsule 1 capsule     famotidine (  PEPCID) 20 MG tablet Take 20 mg by mouth 2 (two) times daily.     gabapentin (NEURONTIN) 300 MG capsule Take 300-600 mg by mouth See admin instructions. Taking 2 capsules (600 mg) in the am and 300 mg at bedtime     guaiFENesin-dextromethorphan (ROBITUSSIN DM) 100-10 MG/5ML syrup Take 5 mLs by mouth every 4 (four) hours as needed for cough.     lamoTRIgine (LAMICTAL) 150 MG tablet Take 150 mg by mouth daily after breakfast.      metFORMIN (GLUCOPHAGE) 500 MG tablet Take 500 mg by mouth 2 (two) times daily with a meal.      oxymetazoline (AFRIN) 0.05 % nasal  spray Place 1 spray into both nostrils 2 (two) times daily as needed for congestion.     pseudoephedrine (SUDAFED) 30 MG tablet Take 30 mg by mouth every 4 (four) hours as needed for congestion.     rivaroxaban (XARELTO) 20 MG TABS tablet Take 1 tablet (20 mg total) by mouth daily with supper. 30 tablet 0   rOPINIRole (REQUIP) 4 MG tablet Take 8-12 mg by mouth at bedtime.     TRINTELLIX 20 MG TABS tablet Take 20 mg by mouth daily.     No current facility-administered medications on file prior to visit.       Observations/Objective: October 2022 when she had a break in therapy while on Xarelto for 5 days due to insurance issues.  CT chest showed nearly occlusive lobar to segmental embolus throughout the right lung.  Echo December 2022 showed EF at 60 to 65%, normal pulmonary artery systolic pressure.    VQ scan done May 29, 2021 showed normal perfusion scan.    Assessment and Plan: Cough/Wheezing with abnormal CT chest with GGO bilaterally. ? Pneumonitis ? Etiology  Will treat with empiric steroids . Close follow up in office in 1 week.  Check ESR , consider autoimmune /CTD labs, HSP .  Will need PFT going forward .  Hold on HRCT chest as patient has had multiple CT scans .   OSA -cont on CPAP   Recurrent PE on lifelong anticoagulation- recent workup neg for new PE, chronic PE , no sign of CTEPH . VQ scan neg , CT chest neg for PE . Echo back to normal with Normal PAP.  Hx of recurrent PE in 84 female-will need lifelong anticoagulation , has 2 daughters, will refer to hematology for any further recommendations .   Plan  Patient Instructions  Prednisone taper over next week.  Mucinex DM Twice daily  As needed  cough/congestion .  Continue on Xarelto 31m daily.  No NSAIDS.  Report any bleeding .  Activity as tolerated.  Continue on CPAP At bedtime   Work healthy weight loss  Do not drive if sleepy .  Refer to Hematology (not stat referral )  (Recurrent PE)  Follow up with  Akif Weldy NP in 1 week and As needed   Follow up in 2 months with Dr. HSilas Floodand As needed  (30 min slot)  Please contact office for sooner follow up if symptoms do not improve or worsen or seek emergency care        Follow Up Instructions:    I discussed the assessment and treatment plan with the patient. The patient was provided an opportunity to ask questions and all were answered. The patient agreed with the plan and demonstrated an understanding of the instructions.   The patient was advised to call back or seek an in-person  evaluation if the symptoms worsen or if the condition fails to improve as anticipated.  I provided 30  minutes of non-face-to-face time during this encounter.   Rexene Edison, NP

## 2021-06-06 NOTE — Patient Instructions (Addendum)
Prednisone taper over next week.  Mucinex DM Twice daily  As needed  cough/congestion .  Continue on Xarelto 20mg  daily.  No NSAIDS.  Report any bleeding .  Activity as tolerated.  Continue on CPAP At bedtime   Work healthy weight loss  Do not drive if sleepy .  Refer to Hematology (not stat referral )  (Recurrent PE)  Follow up with Erika Bienaime NP in 1 week and As needed   Follow up in 2 months with Dr. Silas Woods and As needed  (30 min slot)  Please contact office for sooner follow up if symptoms do not improve or worsen or seek emergency care

## 2021-06-06 NOTE — Addendum Note (Signed)
Addended byRennis Petty on: 06/06/2021 02:53 PM   Modules accepted: Orders

## 2021-06-06 NOTE — Telephone Encounter (Signed)
Called and spoke with pt who stated she did go to the ED, had a CTa performed which showed that there was no PE. Pt said that she did have concerns about certain things she saw on the CT report that she wanted to have further discussed.  Appt has been scheduled for pt with TP today 1/26 at 3:30.nothing further needed.

## 2021-06-11 ENCOUNTER — Other Ambulatory Visit: Payer: Self-pay

## 2021-06-11 ENCOUNTER — Telehealth: Payer: Self-pay | Admitting: Hematology and Oncology

## 2021-06-11 ENCOUNTER — Encounter: Payer: Self-pay | Admitting: Cardiovascular Disease

## 2021-06-11 ENCOUNTER — Ambulatory Visit (INDEPENDENT_AMBULATORY_CARE_PROVIDER_SITE_OTHER): Payer: Commercial Managed Care - PPO | Admitting: Cardiovascular Disease

## 2021-06-11 VITALS — BP 116/84 | HR 91 | Ht 65.0 in | Wt 286.2 lb

## 2021-06-11 DIAGNOSIS — I351 Nonrheumatic aortic (valve) insufficiency: Secondary | ICD-10-CM

## 2021-06-11 DIAGNOSIS — I712 Thoracic aortic aneurysm, without rupture, unspecified: Secondary | ICD-10-CM

## 2021-06-11 NOTE — Progress Notes (Signed)
Chief Complaint  Patient presents with   Follow-up    Thoracic aortic aneurysm   History of Present Illness: 56 yo female with hstory of diabetes, sleep apnea on CPAP, prior pulmonary embolism and thoracic aortic aneurysm who is today for cardiac follow up. I saw her in August 2019 for evaluation of thoracic aortic aneurysm. She was admitted to [facility] in May 2019 with an acute left thigh superficial thrombosis and found to have a PE. She had a procedure on her knee in March 2019. Echo May 2019 with LVEF=65-70%. Mild AI, dilated aortic root. 4.0 cm ascending aortic aneurysm noted on CTA chest. Repeat chest CTA July 2019 with resolution of PE. She was readmitted to [facility] July 2020 with recurrent PE and she has remained on Xarelto. She stopped Xarelto in June 2022 for back surgery and had recurrent PE in June 2022. She missed several doses of Xarelto in October 2022 and had another PE.  RV strain noted on that echo in October 2022. Echo December 2022 with LVEF=60-65%, trivial MR. Bicuspid aortic valve with mild to moderate AI. Mild aortic root dilatation at 4.2 cm. RV function back to normal. She was seen in the ED 06/05/21 with dyspnea and right sided chest pain. Chest CTA without evidence of PE. Troponin negative.   She is here today for follow up. The patient denies any dyspnea, palpitations, lower extremity edema, orthopnea, PND, dizziness, near syncope or syncope. She has occasional mild right sided chest pain. No exertional pain.   Primary Care Physician: Dois Davenport, MD  Past Medical History:  Diagnosis Date   Acute pulmonary embolism ([name]) 11/11/2018   Acute pulmonary embolism with acute cor pulmonale ([name]) 09/16/2017   Acute superficial venous thrombosis of left lower extremity    Aortic insufficiency    Echocardiogram 08/2019: prob bicuspid AoV, mild to mod AI, mild AS (mean 12 mmHg), EF 55-60, no RWMA, Gr 1 DD, GLS -22.2%, normal RVSF, Ao Root 39 mm   Coronary CTA    Coronary CTA 08/2019:  Calcium score 0, no evidence of CAD, ascending aorta 4 cm   Diabetes mellitus without complication ([name])    DM (diabetes mellitus) ([name]) 08/18/2011   Impaired fasting glucose    Mood disorder ([name]) 08/18/2011   Obesity, Class III, BMI 40-49.9 (morbid obesity) ([name]) 09/16/2017   Obstructive sleep apnea on CPAP    Osteoarthritis of knee    Personal history of pulmonary embolism    PTSD (post-traumatic stress disorder) 08/18/2011   Pulmonary emboli ([name]) 09/15/2017   Renal disorder    kideny stones   Restless leg syndrome    Severe recurrent major depression without psychotic features ([name]) 02/17/2018   Sleep apnea 08/18/2011   Thoracic aortic aneurysm    Thoracic aortic aneurysm without rupture     Past Surgical History:  Procedure Laterality Date   arm surgery Bilateral Skin removal   DILATATION & CURETTAGE/HYSTEROSCOPY WITH MYOSURE N/A 06/11/2018   Procedure: DILATATION & CURETTAGE/HYSTEROSCOPY WITH MYOSURE RESECTION OF ENDOMETRIAL THICKENING;  Surgeon: Richardean Chimera, MD;  Location: [facility];  Service: Gynecology;  Laterality: N/A;  BMI 56   DILATION AND EVACUATION     ESOPHAGOGASTRODUODENOSCOPY (EGD) WITH PROPOFOL N/A 07/15/2018   Procedure: ESOPHAGOGASTRODUODENOSCOPY (EGD) WITH PROPOFOL;  Surgeon: Willis Modena, MD;  Location: [facility];  Service: Endoscopy;  Laterality: N/A;   ESOPHAGOGASTRODUODENOSCOPY (EGD) WITH PROPOFOL N/A 04/27/2019   Procedure: ESOPHAGOGASTRODUODENOSCOPY (EGD) WITH PROPOFOL;  Surgeon: Willis Modena, MD;  Location: [facility];  Service: Endoscopy;  Laterality: N/A;   IR ABLATE LIVER CRYOABLATION  10/28/2019   IR ABLATE LIVER CRYOABLATION  05/07/2020   IR RADIOLOGIST EVAL & MGMT  10/14/2019   IR RADIOLOGIST EVAL & MGMT  04/03/2020   KIDNEY STONE SURGERY     KNEE SURGERY Right    LUMBAR LAMINECTOMY/DECOMPRESSION MICRODISCECTOMY N/A 11/08/2020   Procedure: LUMBAR FIVE AND SACRAL ONE LAMINECTOMY/DECOMPRESSION MICRODISCECTOMY 1 LEVEL;  Surgeon: Venita LickBrooks, Dahari, MD;   Location: [facility];  Service: Orthopedics;  Laterality: N/A;   TONSILLECTOMY      Current Outpatient Medications  Medication Sig Dispense Refill   albuterol (VENTOLIN HFA) 108 (90 Base) MCG/ACT inhaler Inhale 2 puffs into the lungs every 6 (six) hours as needed for wheezing or shortness of breath.     cloNIDine (CATAPRES) 0.1 MG tablet Take 0.1-0.3 mg by mouth at bedtime. Taking 0.3 mg at bedtime now     dextromethorphan (DELSYM) 30 MG/5ML liquid Take 15 mg by mouth as needed for cough.     esomeprazole (NEXIUM) 40 MG capsule 1 capsule     famotidine (PEPCID) 20 MG tablet Take 20 mg by mouth 2 (two) times daily.     fluticasone (FLONASE) 50 MCG/ACT nasal spray Place 1 spray into both nostrils as needed.     gabapentin (NEURONTIN) 300 MG capsule Take 300-600 mg by mouth See admin instructions. Taking 2 capsules (600 mg) in the am and 300 mg at bedtime     guaiFENesin-dextromethorphan (ROBITUSSIN DM) 100-10 MG/5ML syrup Take 5 mLs by mouth every 4 (four) hours as needed for cough.     lamoTRIgine (LAMICTAL) 150 MG tablet Take 150 mg by mouth daily after breakfast.      metFORMIN (GLUCOPHAGE) 500 MG tablet Take 500 mg by mouth 2 (two) times daily with a meal.      oxymetazoline (AFRIN) 0.05 % nasal spray Place 1 spray into both nostrils 2 (two) times daily as needed for congestion.     predniSONE (DELTASONE) 10 MG tablet 4 tabs for 2 days, then 3 tabs for 2 days, 2 tabs for 2 days, then 1 tab for 2 days, then stop 20 tablet 0   pseudoephedrine (SUDAFED) 30 MG tablet Take 30 mg by mouth every 4 (four) hours as needed for congestion.     rivaroxaban (XARELTO) 20 MG TABS tablet Take 1 tablet (20 mg total) by mouth daily with supper. 30 tablet 0   rOPINIRole (REQUIP) 4 MG tablet Take 8-12 mg by mouth at bedtime.     traMADol (ULTRAM) 50 MG tablet Take 50 mg by mouth as needed.     TRINTELLIX 20 MG TABS tablet Take 20 mg by mouth daily.     Vitamin D, Ergocalciferol, (DRISDOL) 1.25 MG (50000 UNIT) CAPS  capsule Take 50,000 Units by mouth once a week.     No current facility-administered medications for this visit.    Allergies  Allergen Reactions   Sulfa Antibiotics Hives   Sulfur     Other reaction(s): hives    Social History   Socioeconomic History   Marital status: Married    Spouse name: Not on file   Number of children: 2   Years of education: Not on file   Highest education level: Not on file  Occupational History   Occupation: Pre-school teacher  Tobacco Use   Smoking status: Never   Smokeless tobacco: Never  Vaping Use   Vaping Use: Never used  Substance and Sexual Activity   Alcohol use: Yes  Comment: "wine once or twice a month"   Drug use: No   Sexual activity: Not on file  Other Topics Concern   Not on file  Social History Narrative   Not on file   Social Determinants of Health   Financial Resource Strain: Not on file  Food Insecurity: Not on file  Transportation Needs: Not on file  Physical Activity: Not on file  Stress: Not on file  Social Connections: Not on file  Intimate Partner Violence: Not on file    Family History  Adopted: Yes    Review of Systems:  As stated in the HPI and otherwise negative.   BP 116/84    Pulse 91    Ht 5\' 5"  (1.651 m)    Wt 286 lb 3.2 oz (129.8 kg)    LMP 01/29/2013    SpO2 96%    BMI 47.63 kg/m   Physical Examination: General: Well developed, well nourished, NAD  HEENT: OP clear, mucus membranes moist  SKIN: warm, dry. No rashes. Neuro: No focal deficits  Musculoskeletal: Muscle strength 5/5 all ext  Psychiatric: Mood and affect normal  Neck: No JVD, no carotid bruits, no thyromegaly, no lymphadenopathy.  Lungs:Clear bilaterally, no wheezes, rhonci, crackles Cardiovascular: Regular rate and rhythm. No murmurs, gallops or rubs. Abdomen:Soft. Bowel sounds present. Non-tender.  Extremities: No lower extremity edema. Pulses are 2 + in the bilateral DP/PT.  EKG:  EKG is not ordered today. The ekg ordered  today demonstrates   Echo 04/30/21:  1. Left ventricular ejection fraction, by estimation, is 60 to 65%. The  left ventricle has normal function. The left ventricle has no regional  wall motion abnormalities. Left ventricular diastolic parameters were  normal. The average left ventricular  global longitudinal strain is -20.2 %. The global longitudinal strain is  normal.   2. Right ventricular systolic function is normal. The right ventricular  size is normal. There is normal pulmonary artery systolic pressure.   3. The mitral valve is normal in structure. Trivial mitral valve  regurgitation. No evidence of mitral stenosis.   4. Partial fusion of right and left coronary cusp. The aortic valve is  bicuspid. Aortic valve regurgitation is mild to moderate. No aortic  stenosis is present. Aortic regurgitation PHT measures 657 msec.   5. Aortic dilatation noted. There is mild dilatation of the ascending  aorta, measuring 42 mm.   6. The inferior vena cava is normal in size with greater than 50%  respiratory variability, suggesting right atrial pressure of 3 mmHg.   Recent Labs: 02/20/2021: ALT 24; B Natriuretic Peptide 77.9; Magnesium 2.1 06/05/2021: BUN 24; Creatinine, Ser 0.97; Hemoglobin 12.2; Platelets 225; Potassium 4.6; Sodium 143   Lipid Panel    Component Value Date/Time   CHOL 169 02/18/2018 0623   TRIG 113 02/18/2018 0623   HDL 43 02/18/2018 0623   CHOLHDL 3.9 02/18/2018 0623   VLDL 23 02/18/2018 0623   LDLCALC 103 (H) 02/18/2018 0623     Wt Readings from Last 3 Encounters:  06/11/21 286 lb 3.2 oz (129.8 kg)  06/05/21 288 lb (130.6 kg)  05/30/21 292 lb (132.5 kg)     Other studies Reviewed: Additional studies/ records that were reviewed today include:  Review of the above records demonstrates:    Assessment and Plan:   1. Thoracic aortic aneurysm: 4.0-4.2 cm by recent scans and echo.   2. Aortic insufficiency: Mild to moderate by echo December 2022. Will follow.  Likely bicuspid aortic valve.  Current medicines are reviewed at length with the patient today.  The patient does not have concerns regarding medicines.  The following changes have been made:   Labs/ tests ordered today include:   No orders of the defined types were placed in this encounter.  Disposition:   FU with me in 12 months  Signed, Verne Carrowhristopher Axel Frisk, MD 06/11/2021 3:36 PM    [facility] [address], [place], [place]  [address] Phone: [phone]; Fax: [phone]

## 2021-06-11 NOTE — Telephone Encounter (Signed)
Scheduled appt per 1/26 referral. Spoke to pt who is aware of appt date and time. Pt is aware to arrive 15 mins prior to appt time.  °

## 2021-06-11 NOTE — Patient Instructions (Signed)
Medication Instructions:  ?Your physician recommends that you continue on your current medications as directed. Please refer to the Current Medication list given to you today. ? ?*If you need a refill on your cardiac medications before your next appointment, please call your pharmacy* ? ? ?Lab Work: ?None ordered  ? ?If you have labs (blood work) drawn today and your tests are completely normal, you will receive your results only by: ?MyChart Message (if you have MyChart) OR ?A paper copy in the mail ?If you have any lab test that is abnormal or we need to change your treatment, we will call you to review the results. ? ? ?Testing/Procedures: ?None ordered  ? ? ?Follow-Up: ?At [facility], you and your health needs are our priority.  As part of our continuing mission to provide you with exceptional heart care, we have created designated Provider Care Teams.  These Care Teams include your primary Cardiologist (physician) and Advanced Practice Providers (APPs -  Physician Assistants and Nurse Practitioners) who all work together to provide you with the care you need, when you need it. ? ?We recommend signing up for the patient portal called "MyChart".  Sign up information is provided on this After Visit Summary.  MyChart is used to connect with patients for Virtual Visits (Telemedicine).  Patients are able to view lab/test results, encounter notes, upcoming appointments, etc.  Non-urgent messages can be sent to your provider as well.   ?To learn more about what you can do with MyChart, go to [email or web address].   ? ?Your next appointment:   ?12 month(s) ? ?The format for your next appointment:   ?In Person ? ?Provider:   ?Christopher McAlhany, MD   ? ? ?Other Instructions ?None   ?

## 2021-06-13 ENCOUNTER — Other Ambulatory Visit: Payer: Self-pay | Admitting: Obstetrics and Gynecology

## 2021-06-13 ENCOUNTER — Ambulatory Visit: Payer: Commercial Managed Care - PPO | Admitting: Adult Health

## 2021-06-13 DIAGNOSIS — N6311 Unspecified lump in the right breast, upper outer quadrant: Secondary | ICD-10-CM

## 2021-06-14 ENCOUNTER — Encounter: Payer: Self-pay | Admitting: Adult Health

## 2021-06-14 ENCOUNTER — Other Ambulatory Visit: Payer: Self-pay

## 2021-06-14 ENCOUNTER — Ambulatory Visit (INDEPENDENT_AMBULATORY_CARE_PROVIDER_SITE_OTHER): Payer: Commercial Managed Care - PPO | Admitting: Adult Health

## 2021-06-14 VITALS — BP 106/70 | HR 86 | Temp 98.5°F | Ht 65.0 in | Wt 289.8 lb

## 2021-06-14 DIAGNOSIS — I2699 Other pulmonary embolism without acute cor pulmonale: Secondary | ICD-10-CM | POA: Diagnosis not present

## 2021-06-14 DIAGNOSIS — J189 Pneumonia, unspecified organism: Secondary | ICD-10-CM | POA: Insufficient documentation

## 2021-06-14 LAB — SEDIMENTATION RATE: Sed Rate: 23 mm/hr (ref 0–30)

## 2021-06-14 NOTE — Assessment & Plan Note (Signed)
Possible Pneumonitis with mildly diffuse groundglass opacities throughout the lungs- Improvement after steroid taper.  We will check autoimmune and connective tissue labs. Check PFTs on return  Plan  Patient Instructions  Labs today .  Mucinex DM Twice daily  As needed  cough/congestion .  Continue on Xarelto 20mg  daily.  No NSAIDS.  Report any bleeding .  Activity as tolerated.  Continue on CPAP At bedtime   Work healthy weight loss  Do not drive if sleepy .  Hematology referral   (Recurrent PE)  Follow up in 6 weeks with Dr. and As needed  (30 min slot) -with PFT  Please contact office for sooner follow up if symptoms do not improve or worsen or seek emergency care

## 2021-06-14 NOTE — Assessment & Plan Note (Signed)
Recurrent PE-continue on lifelong anticoagulation therapy No evidence of chronic emboli/  CTEPH as VQ scan was normal and repeat 2D echo is normal. Patient education on anticoagulation therapy .  Patient is aware if she has procedures with need lovenox /heparin bridge  Hematology consult as planned   Plan  Patient Instructions  Labs today .  Mucinex DM Twice daily  As needed  cough/congestion .  Continue on Xarelto 20mg  daily.  No NSAIDS.  Report any bleeding .  Activity as tolerated.  Continue on CPAP At bedtime   Work healthy weight loss  Do not drive if sleepy .  Hematology referral   (Recurrent PE)  Follow up in 6 weeks with Dr. Silas Flood and As needed  (30 min slot) -with PFT  Please contact office for sooner follow up if symptoms do not improve or worsen or seek emergency care

## 2021-06-14 NOTE — Patient Instructions (Addendum)
Labs today .  Mucinex DM Twice daily  As needed  cough/congestion .  Continue on Xarelto 20mg  daily.  No NSAIDS.  Report any bleeding .  Activity as tolerated.  Continue on CPAP At bedtime   Work healthy weight loss  Do not drive if sleepy .  Hematology referral   (Recurrent PE)  Follow up in 6 weeks with Dr. and As needed  (30 min slot) -with PFT  Please contact office for sooner follow up if symptoms do not improve or worsen or seek emergency care

## 2021-06-14 NOTE — Progress Notes (Signed)
@Patient  ID: Erika Woods, female    DOB: Oct 17, 1965, 56 y.o.   MRN: [id number]  Chief Complaint  Patient presents with   Follow-up    Referring provider: [name], MD  HPI: 56 year old female never smoker seen for pulmonary consult January 30, 2021 for recurrent PE.  Patient was first diagnosed with pulmonary emboli in  2019.  She had her second pulmonary emboli on July 2020.  In her third PE in June 2022.  Fourth PE was in October 2022.  She is on lifelong anticoagulation.  Previous work-up with hypercoagulable panel were negative while on anticoagulation therapy. Medical history significant for sleep apnea on CPAP, diabetes, fatty liver disease-nonalcoholic cirrhosis  TEST/EVENTS :   06/14/2021 Follow up ; Dyspnea , PE , Abnormal CT chest  Patient returns for 1 week follow-up.  Patient was seen last visit after emergency room visit.  Patient had been having increased shortness of breath, decreased activity tolerance.  She went to the emergency room on June 05, 2021.  CT chest was negative for PE.  There was mild patchy groundglass opacities bilaterally. Recent recurrent PE work-up , VQ scan on May 29, 2021 was normal with no perfusion defects.  2D echo done December 2022 showed normal pulmonary artery pressure and normal EF.  Normal RV size. Patient had COVID-19 infection in January 2021 and also in June 2022.  CT chest 11/2020 has shown a mosaic attenuation bilaterally in the past.  And most recent CT last month showed groundglass opacities bilaterally. CT chest 09/2017 showed clear lungs , + PE.  Last week patient was treated for possible pneumonitis.  She was started on a prednisone taper.  Patient says she is feeling better with less dyspnea /DOE. Feels her breathing is improved. Does not feel she is back to her baseline . Still have fatigue. No cough,fever , chest pain or edema.   She was adopted, has 2 girls. No known history of autoimmune    Allergies  Allergen  Reactions   Sulfa Antibiotics Hives   Sulfur     Other reaction(s): hives    Immunization History  Administered Date(s) Administered   Hep A / Hep B 06/28/2018, 07/28/2018, 12/21/2018   Influenza,inj,Quad PF,6+ Mos 03/14/2019, 03/21/2019, 02/19/2021   Influenza-Unspecified 01/12/2018   PFIZER(Purple Top)SARS-COV-2 Vaccination 09/20/2019, 10/13/2019   Pneumococcal Polysaccharide-23 02/26/2010    Past Medical History:  Diagnosis Date   Acute pulmonary embolism ([place]) 11/11/2018   Acute pulmonary embolism with acute cor pulmonale ([place]) 09/16/2017   Acute superficial venous thrombosis of left lower extremity    Aortic insufficiency    Echocardiogram 08/2019: prob bicuspid AoV, mild to mod AI, mild AS (mean 12 mmHg), EF 55-60, no RWMA, Gr 1 DD, GLS -22.2%, normal RVSF, Ao Root 39 mm   Coronary CTA    Coronary CTA 08/2019: Calcium score 0, no evidence of CAD, ascending aorta 4 cm   Diabetes mellitus without complication ([name])    DM (diabetes mellitus) ([place]) 08/18/2011   Impaired fasting glucose    Mood disorder ([place]) 08/18/2011   Obesity, Class III, BMI 40-49.9 (morbid obesity) ([place]) 09/16/2017   Obstructive sleep apnea on CPAP    Osteoarthritis of knee    Personal history of pulmonary embolism    PTSD (post-traumatic stress disorder) 08/18/2011   Pulmonary emboli ([place]) 09/15/2017   Renal disorder    kideny stones   Restless leg syndrome    Severe recurrent major depression without psychotic features ([place]) 02/17/2018   Sleep  apnea 08/18/2011   Thoracic aortic aneurysm    Thoracic aortic aneurysm without rupture     Tobacco History: Social History   Tobacco Use  Smoking Status Never  Smokeless Tobacco Never   Counseling given: Not Answered   Outpatient Medications Prior to Visit  Medication Sig Dispense Refill   albuterol (VENTOLIN HFA) 108 (90 Base) MCG/ACT inhaler Inhale 2 puffs into the lungs every 6 (six) hours as needed for wheezing or shortness of breath.     cloNIDine (CATAPRES)  0.1 MG tablet Take 0.1-0.3 mg by mouth at bedtime. Taking 0.3 mg at bedtime now     dextromethorphan (DELSYM) 30 MG/5ML liquid Take 15 mg by mouth as needed for cough.     esomeprazole (NEXIUM) 40 MG capsule 1 capsule     famotidine (PEPCID) 20 MG tablet Take 20 mg by mouth 2 (two) times daily.     fluticasone (FLONASE) 50 MCG/ACT nasal spray Place 1 spray into both nostrils as needed.     gabapentin (NEURONTIN) 300 MG capsule Take 300-600 mg by mouth See admin instructions. Taking 2 capsules (600 mg) in the am and 300 mg at bedtime     guaiFENesin-dextromethorphan (ROBITUSSIN DM) 100-10 MG/5ML syrup Take 5 mLs by mouth every 4 (four) hours as needed for cough.     lamoTRIgine (LAMICTAL) 150 MG tablet Take 150 mg by mouth daily after breakfast.      metFORMIN (GLUCOPHAGE) 500 MG tablet Take 500 mg by mouth 2 (two) times daily with a meal.      oxymetazoline (AFRIN) 0.05 % nasal spray Place 1 spray into both nostrils 2 (two) times daily as needed for congestion.     predniSONE (DELTASONE) 10 MG tablet 4 tabs for 2 days, then 3 tabs for 2 days, 2 tabs for 2 days, then 1 tab for 2 days, then stop 20 tablet 0   pseudoephedrine (SUDAFED) 30 MG tablet Take 30 mg by mouth every 4 (four) hours as needed for congestion.     rivaroxaban (XARELTO) 20 MG TABS tablet Take 1 tablet (20 mg total) by mouth daily with supper. 30 tablet 0   rOPINIRole (REQUIP) 4 MG tablet Take 8-12 mg by mouth at bedtime.     traMADol (ULTRAM) 50 MG tablet Take 50 mg by mouth as needed.     TRINTELLIX 20 MG TABS tablet Take 20 mg by mouth daily.     Vitamin D, Ergocalciferol, (DRISDOL) 1.25 MG (50000 UNIT) CAPS capsule Take 50,000 Units by mouth once a week.     No facility-administered medications prior to visit.     Review of Systems:   Constitutional:   No  weight loss, night sweats,  Fevers, chills,  +fatigue, or  lassitude.  HEENT:   No headaches,  Difficulty swallowing,  Tooth/dental problems, or  Sore throat,                 No sneezing, itching, ear ache, nasal congestion, post nasal drip,   CV:  No chest pain,  Orthopnea, PND, swelling in lower extremities, anasarca, dizziness, palpitations, syncope.   GI  No heartburn, indigestion, abdominal pain, nausea, vomiting, diarrhea, change in bowel habits, loss of appetite, bloody stools.   Resp:   No chest wall deformity  Skin: no rash or lesions.  GU: no dysuria, change in color of urine, no urgency or frequency.  No flank pain, no hematuria   MS:  No joint pain or swelling.  No decreased range of motion.  No back pain.    Physical Exam  BP 106/70 (BP Location: Left Arm, Patient Position: Sitting, Cuff Size: Large)    Pulse 86    Temp 98.5 F (36.9 C) (Oral)    Ht 5\' 5"  (1.651 m)    Wt 289 lb 12.8 oz (131.5 kg)    LMP 01/29/2013    SpO2 96%    BMI 48.23 kg/m   GEN: A/Ox3; pleasant , NAD, well nourished    HEENT:  [place]/AT,  EACs-clear, TMs-wnl, NOSE-clear, THROAT-clear, no lesions, no postnasal drip or exudate noted.   NECK:  Supple w/ fair ROM; no JVD; normal carotid impulses w/o bruits; no thyromegaly or nodules palpated; no lymphadenopathy.    RESP  Clear  P & A; w/o, wheezes/ rales/ or rhonchi. no accessory muscle use, no dullness to percussion  CARD:  RRR, no m/r/g, tr peripheral edema, pulses intact, no cyanosis or clubbing.  GI:   Soft & nt; nml bowel sounds; no organomegaly or masses detected.   Musco: Warm bil, no deformities or joint swelling noted.   Neuro: alert, no focal deficits noted.    Skin: Warm, no lesions or rashes    Lab Results:  CBC    Component Value Date/Time   WBC 6.7 06/05/2021 1828   RBC 4.43 06/05/2021 1828   HGB 12.2 06/05/2021 1828   HGB 12.5 04/18/2020 1326   HCT 39.4 06/05/2021 1828   HCT 37.7 04/18/2020 1326   PLT 225 06/05/2021 1828   PLT 265 04/18/2020 1326   MCV 88.9 06/05/2021 1828   MCV 85 04/18/2020 1326   MCH 27.5 06/05/2021 1828   MCHC 31.0 06/05/2021 1828   RDW 14.6 06/05/2021 1828    RDW 13.4 04/18/2020 1326   LYMPHSABS 2.2 05/30/2021 1602   MONOABS 0.4 05/30/2021 1602   EOSABS 0.3 05/30/2021 1602   BASOSABS 0.1 05/30/2021 1602    BMET    Component Value Date/Time   NA 143 06/05/2021 1828   NA 141 04/18/2020 1326   K 4.6 06/05/2021 1828   CL 108 06/05/2021 1828   CO2 26 06/05/2021 1828   GLUCOSE 97 06/05/2021 1828   BUN 24 (H) 06/05/2021 1828   BUN 15 04/18/2020 1326   CREATININE 0.97 06/05/2021 1828   CREATININE 0.98 09/25/2015 0837   CALCIUM 9.5 06/05/2021 1828   GFRNONAA >60 06/05/2021 1828   GFRNONAA 67 09/25/2015 0837   GFRAA 66 04/18/2020 1326   GFRAA 77 09/25/2015 0837    BNP    Component Value Date/Time   BNP 77.9 02/20/2021 0125    ProBNP No results found for: PROBNP  Imaging: DG Chest 2 View  Result Date: 06/05/2021 CLINICAL DATA:  Chest pain EXAM: CHEST - 2 VIEW COMPARISON:  05/29/2021 FINDINGS: The heart size and mediastinal contours are within normal limits. Both lungs are clear. The visualized skeletal structures are unremarkable. IMPRESSION: No active cardiopulmonary disease. Electronically Signed   By: Franchot Gallo M.D.   On: 06/05/2021 18:23   DG Chest 2 View  Result Date: 05/29/2021 CLINICAL DATA:  Shortness of breath, history of pulmonary embolism EXAM: CHEST - 2 VIEW COMPARISON:  01/29/2021 FINDINGS: Mild enlargement of cardiac silhouette. Tortuous aorta. Pulmonary vascularity normal. Lungs clear. No infiltrate, pleural effusion, or pneumothorax. Osseous structures unremarkable. IMPRESSION: Enlargement of cardiac silhouette. No acute abnormalities. Electronically Signed   By: Lavonia Dana M.D.   On: 05/29/2021 14:34   CT Angio Chest PE W and/or Wo Contrast  Result Date: 06/05/2021 CLINICAL DATA:  Pulmonary embolism. EXAM: CT ANGIOGRAPHY CHEST WITH CONTRAST TECHNIQUE: Multidetector CT imaging of the chest was performed using the standard protocol during bolus administration of intravenous contrast. Multiplanar CT image  reconstructions and MIPs were obtained to evaluate the vascular anatomy. RADIATION DOSE REDUCTION: This exam was performed according to the departmental dose-optimization program which includes automated exposure control, adjustment of the mA and/or kV according to patient size and/or use of iterative reconstruction technique. CONTRAST:  11mL OMNIPAQUE IOHEXOL 350 MG/ML SOLN COMPARISON:  CT status chest CT dated 02/18/2021 and radiograph dated 06/05/2021. FINDINGS: Cardiovascular: Borderline cardiomegaly. No pericardial effusion. The thoracic aorta is unremarkable. The origins of the great vessels of the aortic arch appear patent. Mildly dilated main pulmonary trunk suggestive of pulmonary hypertension. Evaluation of the pulmonary arteries is limited due to respiratory motion artifact and suboptimal opacification and timing of the contrast. No large or central pulmonary artery embolus identified. Mediastinum/Nodes: No hilar or mediastinal adenopathy. Small hiatal hernia. The esophagus and the thyroid gland are grossly unremarkable. No mediastinal fluid collection. Lungs/Pleura: There is diffuse patchy ground-glass density throughout the lungs, likely atelectasis. No focal consolidation, pleural effusion, pneumothorax. The central airways are patent. Upper Abdomen: Fatty liver. Musculoskeletal: Degenerative changes of the spine. No acute osseous pathology. Review of the MIP images confirms the above findings. IMPRESSION: 1. No CT evidence of central pulmonary artery embolus. 2. Borderline cardiomegaly with mild dilatation of the main pulmonary trunk suggestive of pulmonary hypertension. 3. Fatty liver. Electronically Signed   By: Anner Crete M.D.   On: 06/05/2021 19:53   NM Pulmonary Perfusion  Result Date: 05/29/2021 CLINICAL DATA:  Shortness of breath, question chronic thromboembolic pulmonary hypertension, history of pulmonary embolism. EXAM: NUCLEAR MEDICINE PERFUSION LUNG SCAN TECHNIQUE: Perfusion  images were obtained in multiple projections after intravenous injection of radiopharmaceutical. Ventilation scans intentionally deferred if perfusion scan and chest x-ray adequate for interpretation during COVID 19 epidemic. RADIOPHARMACEUTICALS:  4.4 mCi Tc-8m MAA IV COMPARISON:  CT angio chest 02/18/2021, chest radiograph 05/29/2021 FINDINGS: Normal perfusion lung scan. No perfusion defects. IMPRESSION: Normal perfusion lung scan. Electronically Signed   By: Lavonia Dana M.D.   On: 05/29/2021 14:33      No flowsheet data found.  No results found for: NITRICOXIDE      Assessment & Plan:   Pulmonary emboli ([name]) Recurrent PE-continue on lifelong anticoagulation therapy No evidence of chronic emboli/  CTEPH as VQ scan was normal and repeat 2D echo is normal. Patient education on anticoagulation therapy .  Patient is aware if she has procedures with need lovenox /heparin bridge  Hematology consult as planned   Plan  Patient Instructions  Labs today .  Mucinex DM Twice daily  As needed  cough/congestion .  Continue on Xarelto 20mg  daily.  No NSAIDS.  Report any bleeding .  Activity as tolerated.  Continue on CPAP At bedtime   Work healthy weight loss  Do not drive if sleepy .  Hematology referral   (Recurrent PE)  Follow up in 6 weeks with Dr. Silas Flood and As needed  (30 min slot) -with PFT  Please contact office for sooner follow up if symptoms do not improve or worsen or seek emergency care         Pneumonitis Possible Pneumonitis with mildly diffuse groundglass opacities throughout the lungs- Improvement after steroid taper.  We will check autoimmune and connective tissue labs. Check PFTs on return  Plan  Patient Instructions  Labs today .  Mucinex DM Twice daily  As  needed  cough/congestion .  Continue on Xarelto 20mg  daily.  No NSAIDS.  Report any bleeding .  Activity as tolerated.  Continue on CPAP At bedtime   Work healthy weight loss  Do not drive if  sleepy .  Hematology referral   (Recurrent PE)  Follow up in 6 weeks with Dr. Silas Flood and As needed  (30 min slot) -with PFT  Please contact office for sooner follow up if symptoms do not improve or worsen or seek emergency care           Rexene Edison, NP 06/14/2021

## 2021-06-17 LAB — ANA: Anti Nuclear Antibody (ANA): NEGATIVE

## 2021-06-17 LAB — CYCLIC CITRUL PEPTIDE ANTIBODY, IGG: Cyclic Citrullin Peptide Ab: <16 UNITS

## 2021-06-17 LAB — ANTI-DNA ANTIBODY, DOUBLE-STRANDED: ds DNA Ab: <1 IU/mL

## 2021-06-17 LAB — RHEUMATOID FACTOR: Rheumatoid fact SerPl-aCnc: <14 IU/mL (ref <14)

## 2021-06-17 LAB — SJOGRENS SYNDROME-B EXTRACTABLE NUCLEAR ANTIBODY: SSB (La) (ENA) Antibody, IgG: <1 AI

## 2021-06-17 LAB — SJOGRENS SYNDROME-A EXTRACTABLE NUCLEAR ANTIBODY: SSA (Ro) (ENA) Antibody, IgG: <1 AI

## 2021-06-17 LAB — ANCA SCREEN W REFLEX TITER: ANCA Screen: NEGATIVE

## 2021-06-19 LAB — HYPERSENSITIVITY PNEUMONITIS
A. Pullulans Abs: NEGATIVE
A.Fumigatus #1 Abs: NEGATIVE
Micropolyspora faeni, IgG: NEGATIVE
Pigeon Serum Abs: NEGATIVE
Thermoact. Saccharii: NEGATIVE
Thermoactinomyces vulgaris, IgG: NEGATIVE

## 2021-06-25 ENCOUNTER — Other Ambulatory Visit: Payer: Commercial Managed Care - PPO

## 2021-06-26 ENCOUNTER — Other Ambulatory Visit: Payer: Self-pay | Admitting: Gastroenterology

## 2021-06-26 DIAGNOSIS — K7460 Unspecified cirrhosis of liver: Secondary | ICD-10-CM

## 2021-06-27 ENCOUNTER — Telehealth: Payer: Self-pay | Admitting: Adult Health

## 2021-06-27 ENCOUNTER — Inpatient Hospital Stay: Payer: Commercial Managed Care - PPO | Attending: Hematology and Oncology | Admitting: Hematology and Oncology

## 2021-06-27 ENCOUNTER — Other Ambulatory Visit: Payer: Self-pay

## 2021-06-27 ENCOUNTER — Inpatient Hospital Stay: Payer: Commercial Managed Care - PPO

## 2021-06-27 VITALS — BP 126/78 | HR 77 | Temp 98.1°F | Resp 17 | Wt 288.4 lb

## 2021-06-27 DIAGNOSIS — I351 Nonrheumatic aortic (valve) insufficiency: Secondary | ICD-10-CM | POA: Diagnosis not present

## 2021-06-27 DIAGNOSIS — Z7901 Long term (current) use of anticoagulants: Secondary | ICD-10-CM

## 2021-06-27 DIAGNOSIS — I2699 Other pulmonary embolism without acute cor pulmonale: Secondary | ICD-10-CM | POA: Diagnosis present

## 2021-06-27 DIAGNOSIS — E119 Type 2 diabetes mellitus without complications: Secondary | ICD-10-CM

## 2021-06-27 DIAGNOSIS — I2609 Other pulmonary embolism with acute cor pulmonale: Secondary | ICD-10-CM

## 2021-06-27 DIAGNOSIS — G4733 Obstructive sleep apnea (adult) (pediatric): Secondary | ICD-10-CM

## 2021-06-27 LAB — CBC WITH DIFFERENTIAL (CANCER CENTER ONLY)
Abs Immature Granulocytes: 0.01 10*3/uL (ref 0.00–0.07)
Basophils Absolute: 0.1 10*3/uL (ref 0.0–0.1)
Basophils Relative: 1 %
Eosinophils Absolute: 0.5 10*3/uL (ref 0.0–0.5)
Eosinophils Relative: 7 %
HCT: 39.4 % (ref 36.0–46.0)
Hemoglobin: 12.4 g/dL (ref 12.0–15.0)
Immature Granulocytes: 0 %
Lymphocytes Relative: 30 %
Lymphs Abs: 2.3 10*3/uL (ref 0.7–4.0)
MCH: 27.8 pg (ref 26.0–34.0)
MCHC: 31.5 g/dL (ref 30.0–36.0)
MCV: 88.3 fL (ref 80.0–100.0)
Monocytes Absolute: 0.4 10*3/uL (ref 0.1–1.0)
Monocytes Relative: 6 %
Neutro Abs: 4.4 10*3/uL (ref 1.7–7.7)
Neutrophils Relative %: 56 %
Platelet Count: 252 10*3/uL (ref 150–400)
RBC: 4.46 MIL/uL (ref 3.87–5.11)
RDW: 14.7 % (ref 11.5–15.5)
WBC Count: 7.8 10*3/uL (ref 4.0–10.5)
nRBC: 0 % (ref 0.0–0.2)

## 2021-06-27 LAB — CMP (CANCER CENTER ONLY)
ALT: 20 U/L (ref 0–44)
AST: 21 U/L (ref 15–41)
Albumin: 4.7 g/dL (ref 3.5–5.0)
Alkaline Phosphatase: 69 U/L (ref 38–126)
Anion gap: 6 (ref 5–15)
BUN: 31 mg/dL — ABNORMAL HIGH (ref 6–20)
CO2: 28 mmol/L (ref 22–32)
Calcium: 9.5 mg/dL (ref 8.9–10.3)
Chloride: 105 mmol/L (ref 98–111)
Creatinine: 1.13 mg/dL — ABNORMAL HIGH (ref 0.44–1.00)
GFR, Estimated: 57 mL/min — ABNORMAL LOW (ref >60)
Glucose, Bld: 87 mg/dL (ref 70–99)
Potassium: 4.5 mmol/L (ref 3.5–5.1)
Sodium: 139 mmol/L (ref 135–145)
Total Bilirubin: 0.6 mg/dL (ref 0.3–1.2)
Total Protein: 7.5 g/dL (ref 6.5–8.1)

## 2021-06-27 NOTE — Telephone Encounter (Signed)
Spoke to patient.  She reports of increased sob and wheezing with exertion x2d.  Spo2 has dropped as low 78% with exertion on roomair. She recovers with of rest.  She does not wear supplemental oxygen.  She is currently taking Xarelto 20mg . She is concerned about desats, as she has similar sx with prior PE.  Denied f/c/s or additional sx.   Tammy, please advise. Thanks

## 2021-06-27 NOTE — Telephone Encounter (Signed)
Called and spoke with patient and got her scheduled to see Dr Tonia Brooms tomorrow at 12pm.   No questions noted from patient  Nothing else needed at this time

## 2021-06-27 NOTE — Telephone Encounter (Signed)
Needs OV. Dr. Tonia Brooms has a 12 Noon ov tomororw for eval.  Please contact office for sooner follow up if symptoms do not improve or worsen or seek emergency care

## 2021-06-27 NOTE — Progress Notes (Signed)
[place] [phone]   Fax:([phone] [place] NOTE  Patient Care Team: [name], MD as PCP - General (Family Medicine) Burnell Blanks, MD as PCP - Cardiology (Cardiology)  Hematological/Oncological History # Recurrent Pulmonary Emboli  09/15/2017: CT PE study showed acute pulmonary embolus is noted in right main pulmonary artery and lower lobe branches. Positive for acute PE with CT evidence of right heart strain (RV/LV Ratio = 1.0) 11/11/2018: CT PE study showed acute pulmonary embolus affecting all lobes in the right lung. No central or saddle embolus 11/12/2018: Hypercoagluation studies ordered.  11/09/2020: CT PE study showed bilateral pulmonary emboli are noted, with large embolus seen in the distal right pulmonary artery which extends into the lower lobe branches. Positive for acute PE with CT evidence of right heart strain (RV/LV Ratio = 1.2) consistent with at least submassive (intermediate risk) PE. 02/18/2021: CT PE study showed nearly occlusive, lobar to segmental embolus throughout the right lung. No embolus identified in the left lung. Also noted to have concern for right heart strain 05/29/2021: NM pulmonary perfusion test negative  06/05/2021: CT Angio Chest showed no evidence of VTE 06/27/2021: establish care with Dr. Lorenso Courier   CHIEF COMPLAINTS/PURPOSE OF CONSULTATION:  "Recurrent Pulmonary Emboli  "  HISTORY OF PRESENTING ILLNESS:  Erika Woods 56 y.o. female with medical history significant for obesity, obstructive sleep apnea, osteoarthritis of the knee, PTSD, aortic insufficiency, type 2 diabetes who presents for evaluation of recurrent pulmonary emboli.  On review of the previous records Ms. Delvillar has a very strong history of VTE which is explained above.  Of note her first pulmonary embolism was provoked in the setting of a prior knee surgery.  She received Xarelto for 6 months duration as was appropriate.  Her  subsequent pulmonary embolism in June 2020 had no clear etiology.  At that time she was started on Xarelto indefinitely and underwent a hypercoagulation work-up which was unremarkable.  Her subsequent VTE's all occurred during periods of time where she did not have her Eliquis.  In 2022 it occurred when she was quarantined for [place] and did not have access to her Xarelto medication.  She is without for approximately 7 to 8 days and developed blood clots.  Additionally she underwent a surgery in June 2022 and held her Xarelto for 4 days after which she developed blood clots again.  She notes that she is still on Xarelto therapy and tolerates it overall well.  She reports no bleeding, bruising, or dark stools.  On further discussion she notes that she is adopted and does not have any family history.  Her social history is remarkable for being a never smoker and never drinker.  She currently works as a Print production planner at [employer].  She notes that she does occasionally have some episodes of shortness of breath and fatigue which worried her because these have been symptoms of blood clots before in the past.  She does that she has a pulse oximeter at home which she uses to take her blood oxygen levels.  She currently denies any fevers, chills, sweats, nausea, vomiting or diarrhea.  A full 10 point ROS is listed below.  MEDICAL HISTORY:  Past Medical History:  Diagnosis Date   Acute pulmonary embolism ([place]) 11/11/2018   Acute pulmonary embolism with acute cor pulmonale ([place]) 09/16/2017   Acute superficial venous thrombosis of left lower extremity    Aortic insufficiency    Echocardiogram 08/2019: prob bicuspid AoV,  mild to mod AI, mild AS (mean 12 mmHg), EF 55-60, no RWMA, Gr 1 DD, GLS -22.2%, normal RVSF, Ao Root 39 mm   Coronary CTA    Coronary CTA 08/2019: Calcium score 0, no evidence of CAD, ascending aorta 4 cm   Diabetes mellitus without complication ([name])    DM (diabetes mellitus) ([name]) 08/18/2011    Impaired fasting glucose    Mood disorder ([name]) 08/18/2011   Obesity, Class III, BMI 40-49.9 (morbid obesity) ([name]) 09/16/2017   Obstructive sleep apnea on CPAP    Osteoarthritis of knee    Personal history of pulmonary embolism    PTSD (post-traumatic stress disorder) 08/18/2011   Pulmonary emboli ([name]) 09/15/2017   Renal disorder    kideny stones   Restless leg syndrome    Severe recurrent major depression without psychotic features ([name]) 02/17/2018   Sleep apnea 08/18/2011   Thoracic aortic aneurysm    Thoracic aortic aneurysm without rupture     SURGICAL HISTORY: Past Surgical History:  Procedure Laterality Date   arm surgery Bilateral Skin removal   DILATATION & CURETTAGE/HYSTEROSCOPY WITH MYOSURE N/A 06/11/2018   Procedure: DILATATION & CURETTAGE/HYSTEROSCOPY WITH MYOSURE RESECTION OF ENDOMETRIAL THICKENING;  Surgeon: Richardean Chimera, MD;  Location: [facility];  Service: Gynecology;  Laterality: N/A;  BMI 56   DILATION AND EVACUATION     ESOPHAGOGASTRODUODENOSCOPY (EGD) WITH PROPOFOL N/A 07/15/2018   Procedure: ESOPHAGOGASTRODUODENOSCOPY (EGD) WITH PROPOFOL;  Surgeon: Willis Modena, MD;  Location: [facility];  Service: Endoscopy;  Laterality: N/A;   ESOPHAGOGASTRODUODENOSCOPY (EGD) WITH PROPOFOL N/A 04/27/2019   Procedure: ESOPHAGOGASTRODUODENOSCOPY (EGD) WITH PROPOFOL;  Surgeon: Willis Modena, MD;  Location: [facility];  Service: Endoscopy;  Laterality: N/A;   IR ABLATE LIVER CRYOABLATION  10/28/2019   IR ABLATE LIVER CRYOABLATION  05/07/2020   IR RADIOLOGIST EVAL & MGMT  10/14/2019   IR RADIOLOGIST EVAL & MGMT  04/03/2020   KIDNEY STONE SURGERY     KNEE SURGERY Right    LUMBAR LAMINECTOMY/DECOMPRESSION MICRODISCECTOMY N/A 11/08/2020   Procedure: LUMBAR FIVE AND SACRAL ONE LAMINECTOMY/DECOMPRESSION MICRODISCECTOMY 1 LEVEL;  Surgeon: Venita Lick, MD;  Location: [facility];  Service: Orthopedics;  Laterality: N/A;   TONSILLECTOMY      SOCIAL HISTORY: Social History   Socioeconomic History    Marital status: Married    Spouse name: Not on file   Number of children: 2   Years of education: Not on file   Highest education level: Not on file  Occupational History   Occupation: Pre-school teacher  Tobacco Use   Smoking status: Never   Smokeless tobacco: Never  Vaping Use   Vaping Use: Never used  Substance and Sexual Activity   Alcohol use: Yes    Comment: "wine once or twice a month"   Drug use: No   Sexual activity: Not on file  Other Topics Concern   Not on file  Social History Narrative   Not on file   Social Determinants of Health   Financial Resource Strain: Not on file  Food Insecurity: Not on file  Transportation Needs: Not on file  Physical Activity: Not on file  Stress: Not on file  Social Connections: Not on file  Intimate Partner Violence: Not on file    FAMILY HISTORY: Family History  Adopted: Yes    ALLERGIES:  is allergic to sulfa antibiotics.  MEDICATIONS:  Current Outpatient Medications  Medication Sig Dispense Refill   albuterol (VENTOLIN HFA) 108 (90 Base) MCG/ACT inhaler Inhale 2 puffs into the lungs every  6 (six) hours as needed for wheezing or shortness of breath.     cloNIDine (CATAPRES) 0.3 MG tablet clonidine HCl 0.3 mg tablet  TAKE 1 TABLET BY MOUTH EVERYDAY AT BEDTIME     dextromethorphan (DELSYM) 30 MG/5ML liquid Take 15 mg by mouth as needed for cough.     esomeprazole (NEXIUM) 40 MG capsule 1 capsule     famotidine (PEPCID) 20 MG tablet Take 20 mg by mouth 2 (two) times daily.     gabapentin (NEURONTIN) 300 MG capsule Take 300-600 mg by mouth See admin instructions. Taking 2 capsules (600 mg) in the am and 300 mg at bedtime     lamoTRIgine (LAMICTAL) 150 MG tablet Take 150 mg by mouth daily after breakfast.      metFORMIN (GLUCOPHAGE) 500 MG tablet Take 500 mg by mouth 2 (two) times daily with a meal.      oxymetazoline (AFRIN) 0.05 % nasal spray Place 1 spray into both nostrils 2 (two) times daily as needed for congestion.      rivaroxaban (XARELTO) 20 MG TABS tablet Take 1 tablet (20 mg total) by mouth daily with supper. 30 tablet 0   rOPINIRole (REQUIP) 4 MG tablet Take 8-12 mg by mouth at bedtime.     traMADol (ULTRAM) 50 MG tablet Take 50 mg by mouth as needed.     TRINTELLIX 20 MG TABS tablet Take 20 mg by mouth daily.     Vitamin D, Ergocalciferol, (DRISDOL) 1.25 MG (50000 UNIT) CAPS capsule Take 50,000 Units by mouth once a week.     No current facility-administered medications for this visit.    REVIEW OF SYSTEMS:   Constitutional: ( - ) fevers, ( - )  chills , ( - ) night sweats Eyes: ( - ) blurriness of vision, ( - ) double vision, ( - ) watery eyes Ears, nose, mouth, throat, and face: ( - ) mucositis, ( - ) sore throat Respiratory: ( - ) cough, ( - ) dyspnea, ( - ) wheezes Cardiovascular: ( - ) palpitation, ( - ) chest discomfort, ( - ) lower extremity swelling Gastrointestinal:  ( - ) nausea, ( - ) heartburn, ( - ) change in bowel habits Skin: ( - ) abnormal skin rashes Lymphatics: ( - ) new lymphadenopathy, ( - ) easy bruising Neurological: ( - ) numbness, ( - ) tingling, ( - ) new weaknesses Behavioral/Psych: ( - ) mood change, ( - ) new changes  All other systems were reviewed with the patient and are negative.  PHYSICAL EXAMINATION:  Vitals:   06/27/21 1356  BP: 126/78  Pulse: 77  Resp: 17  Temp: 98.1 F (36.7 C)  SpO2: 99%   Filed Weights   06/27/21 1356  Weight: 288 lb 6 oz (130.8 kg)    GENERAL: well appearing middle-aged Caucasian female in NAD  SKIN: skin color, texture, turgor are normal, no rashes or significant lesions EYES: conjunctiva are pink and non-injected, sclera clear LUNGS: clear to auscultation and percussion with normal breathing effort HEART: regular rate & rhythm and no murmurs and no lower extremity edema Musculoskeletal: no cyanosis of digits and no clubbing  PSYCH: alert & oriented x 3, fluent speech NEURO: no focal motor/sensory deficits  LABORATORY  DATA:  I have reviewed the data as listed CBC Latest Ref Rng & Units 06/27/2021 06/05/2021 05/30/2021  WBC 4.0 - 10.5 K/uL 7.8 6.7 7.7  Hemoglobin 12.0 - 15.0 g/dL 12.4 12.2 12.6  Hematocrit 36.0 - 46.0 %  39.4 39.4 39.8  Platelets 150 - 400 K/uL 252 225 251.0    CMP Latest Ref Rng & Units 06/27/2021 06/05/2021 05/30/2021  Glucose 70 - 99 mg/dL 87 97 90  BUN 6 - 20 mg/dL 31(H) 24(H) 25(H)  Creatinine 0.44 - 1.00 mg/dL 1.13(H) 0.97 1.13  Sodium 135 - 145 mmol/L 139 143 140  Potassium 3.5 - 5.1 mmol/L 4.5 4.6 4.3  Chloride 98 - 111 mmol/L 105 108 104  CO2 22 - 32 mmol/L 28 26 28   Calcium 8.9 - 10.3 mg/dL 9.5 9.5 9.4  Total Protein 6.5 - 8.1 g/dL 7.5 - -  Total Bilirubin 0.3 - 1.2 mg/dL 0.6 - -  Alkaline Phos 38 - 126 U/L 69 - -  AST 15 - 41 U/L 21 - -  ALT 0 - 44 U/L 20 - -     ASSESSMENT & PLAN Erika Woods 56 y.o. female with medical history significant for obesity, obstructive sleep apnea, osteoarthritis of the knee, PTSD, aortic insufficiency, type 2 diabetes who presents for evaluation of recurrent pulmonary emboli.  After review of the labs, review of the records, and discussion with the patient the patients findings are most consistent with recurrent pulmonary emboli.  The patient did have an unprovoked pulmonary emboli as her second PE and therefore indefinite anticoagulation will be recommended.  Additionally she has proven that every time she discontinues her anticoagulation even for a brief period of time she develops a blood clot.  Due to this I would recommend no interruption in her anticoagulation therapy unless absolutely necessary.  We will plan to see the patient back in approximate 6 months time in order to reevaluate.  # Recurrent Pulmonary Emboli -- The patient's first VTE was a provoked pulmonary emboli and she received limited duration anticoagulation.  Her second pulmonary emboli was unprovoked.  Subsequent pulmonary emboli have occurred with even brief discontinuation  of anticoagulation. --I would recommend indefinite anticoagulation.  Can continue Xarelto therapy for now but if there is any difficulties would consider transition to Eliquis. --Prior hypercoagulation work-up was unremarkable.  We will repeat antiphospholipid antibody testing but otherwise no further work-up is required. -- Would not recommend discontinuation of Xarelto even for a short period of time. --Return to clinic in 6 months time to reevaluate.  Orders Placed This Encounter  Procedures   CBC with Differential ([facility] Only)    Standing Status:   Future    Number of Occurrences:   1    Standing Expiration Date:   06/27/2022   CMP ([place] only)    Standing Status:   Future    Number of Occurrences:   1    Standing Expiration Date:   06/27/2022   Beta-2-glycoprotein i abs, IgG/M/A    Standing Status:   Future    Number of Occurrences:   1    Standing Expiration Date:   06/27/2022   Cardiolipin antibodies, IgG, IgM, IgA*    Standing Status:   Future    Number of Occurrences:   1    Standing Expiration Date:   06/27/2022    All questions were answered. The patient knows to call the clinic with any problems, questions or concerns.  A total of more than 60 minutes were spent on this encounter with face-to-face time and non-face-to-face time, including preparing to see the patient, ordering tests and/or medications, counseling the patient and coordination of care as outlined above.   Ledell Peoples, MD Department of Hematology/Oncology [facility] Phone: [phone] Pager: [phone] Email: [email or web address]  07/02/2021 11:00 AM

## 2021-06-28 ENCOUNTER — Ambulatory Visit: Payer: Commercial Managed Care - PPO | Admitting: Pulmonary Disease

## 2021-06-28 ENCOUNTER — Other Ambulatory Visit: Payer: Commercial Managed Care - PPO

## 2021-06-28 LAB — BETA-2-GLYCOPROTEIN I ABS, IGG/M/A
Beta-2 Glyco I IgG: <9 GPI IgG units (ref 0–20)
Beta-2-Glycoprotein I IgA: <9 GPI IgA units (ref 0–25)
Beta-2-Glycoprotein I IgM: <9 GPI IgM units (ref 0–32)

## 2021-06-28 NOTE — Progress Notes (Incomplete)
Synopsis: Referred in September 2022 for pulmonary embolism by Erika Rasmussen, MD  Subjective:   PATIENT ID: Erika Woods GENDER: female DOB: 1965/11/13, MRN: [id number]  No chief complaint on file.   This is a 56 year old female, past medical history of pulmonary embolism, first PE was in 2019-second PE in July 2020.,  Obesity, diabetes, coronary artery disease.  She has had 3 episodes of pulmonary embolism.  1 after a arthroscopic knee surgery.  She was treated for 6 months of anticoagulation then stopped.  She then developed a second pulmonary embolism and was started on anticoagulation indefinitely.  She missed approximately 8 days of anticoagulation during time in which she had COVID and developed a third PE.  So each time she is actually had pulmonary emboli have been off of anticoagulation.  She had work-up completed by primary care for hypercoagulability panel, prothrombin gene mutation and factor V Leiden negative.  She had a repeat echocardiogram in July that showed moderate right ventricular dysfunction.  She also has obstructive sleep apnea on CPAP, BMI 51.  OV 06/28/2021:Since last office visit patient had a additional series of events.  She had her fourth pulmonary embolism in October 2022.  She ran out of her medications for a short period and ultimately developed shortness of breath with repeat imaging confirming a fourth pulmonary embolism.  She had a VQ scan that looked normal in January 2023 and echocardiogram with normal pulmonary artery pressures and a normal ejection fraction in December 2022.  During this time has been seen by Tammy, NP in the office for these separate acute events.  Returning today to see me.  She has a hematology consult pending.  Unfortunately past couple days developed worsening shortness of breath and presents today for acute visit.  She has noticed her oxygen saturations drop with exertion.   Past Medical History:  Diagnosis Date   Acute pulmonary  embolism ([name]) 11/11/2018   Acute pulmonary embolism with acute cor pulmonale ([name]) 09/16/2017   Acute superficial venous thrombosis of left lower extremity    Aortic insufficiency    Echocardiogram 08/2019: prob bicuspid AoV, mild to mod AI, mild AS (mean 12 mmHg), EF 55-60, no RWMA, Gr 1 DD, GLS -22.2%, normal RVSF, Ao Root 39 mm   Coronary CTA    Coronary CTA 08/2019: Calcium score 0, no evidence of CAD, ascending aorta 4 cm   Diabetes mellitus without complication ([name])    DM (diabetes mellitus) ([name]) 08/18/2011   Impaired fasting glucose    Mood disorder ([name]) 08/18/2011   Obesity, Class III, BMI 40-49.9 (morbid obesity) ([name]) 09/16/2017   Obstructive sleep apnea on CPAP    Osteoarthritis of knee    Personal history of pulmonary embolism    PTSD (post-traumatic stress disorder) 08/18/2011   Pulmonary emboli ([name]) 09/15/2017   Renal disorder    kideny stones   Restless leg syndrome    Severe recurrent major depression without psychotic features ([name]) 02/17/2018   Sleep apnea 08/18/2011   Thoracic aortic aneurysm    Thoracic aortic aneurysm without rupture      Family History  Adopted: Yes     Past Surgical History:  Procedure Laterality Date   arm surgery Bilateral Skin removal   DILATATION & CURETTAGE/HYSTEROSCOPY WITH MYOSURE N/A 06/11/2018   Procedure: DILATATION & CURETTAGE/HYSTEROSCOPY WITH MYOSURE RESECTION OF ENDOMETRIAL THICKENING;  Surgeon: [name], MD;  Location: [facility];  Service: Gynecology;  Laterality: N/A;  BMI 56   DILATION AND EVACUATION  ESOPHAGOGASTRODUODENOSCOPY (EGD) WITH PROPOFOL N/A 07/15/2018   Procedure: ESOPHAGOGASTRODUODENOSCOPY (EGD) WITH PROPOFOL;  Surgeon: [name], MD;  Location: [facility];  Service: Endoscopy;  Laterality: N/A;   ESOPHAGOGASTRODUODENOSCOPY (EGD) WITH PROPOFOL N/A 04/27/2019   Procedure: ESOPHAGOGASTRODUODENOSCOPY (EGD) WITH PROPOFOL;  Surgeon: [name], MD;  Location: [facility];  Service:  Endoscopy;  Laterality: N/A;   IR ABLATE LIVER CRYOABLATION  10/28/2019   IR ABLATE LIVER CRYOABLATION  05/07/2020   IR RADIOLOGIST EVAL & MGMT  10/14/2019   IR RADIOLOGIST EVAL & MGMT  04/03/2020   KIDNEY STONE SURGERY     KNEE SURGERY Right    LUMBAR LAMINECTOMY/DECOMPRESSION MICRODISCECTOMY N/A 11/08/2020   Procedure: LUMBAR FIVE AND SACRAL ONE LAMINECTOMY/DECOMPRESSION MICRODISCECTOMY 1 LEVEL;  Surgeon: Melina Schools, MD;  Location: [facility];  Service: Orthopedics;  Laterality: N/A;   TONSILLECTOMY      Social History   Socioeconomic History   Marital status: Married    Spouse name: Not on file   Number of children: 2   Years of education: Not on file   Highest education level: Not on file  Occupational History   Occupation: Pre-school teacher  Tobacco Use   Smoking status: Never   Smokeless tobacco: Never  Vaping Use   Vaping Use: Never used  Substance and Sexual Activity   Alcohol use: Yes    Comment: "wine once or twice a month"   Drug use: No   Sexual activity: Not on file  Other Topics Concern   Not on file  Social History Narrative   Not on file   Social Determinants of Health   Financial Resource Strain: Not on file  Food Insecurity: Not on file  Transportation Needs: Not on file  Physical Activity: Not on file  Stress: Not on file  Social Connections: Not on file  Intimate Partner Violence: Not on file     Allergies  Allergen Reactions   Sulfa Antibiotics Hives     Outpatient Medications Prior to Visit  Medication Sig Dispense Refill   albuterol (VENTOLIN HFA) 108 (90 Base) MCG/ACT inhaler Inhale 2 puffs into the lungs every 6 (six) hours as needed for wheezing or shortness of breath.     cloNIDine (CATAPRES) 0.3 MG tablet clonidine HCl 0.3 mg tablet  TAKE 1 TABLET BY MOUTH EVERYDAY AT BEDTIME     dextromethorphan (DELSYM) 30 MG/5ML liquid Take 15 mg by mouth as needed for cough.     esomeprazole (NEXIUM) 40 MG capsule 1  capsule     famotidine (PEPCID) 20 MG tablet Take 20 mg by mouth 2 (two) times daily.     gabapentin (NEURONTIN) 300 MG capsule Take 300-600 mg by mouth See admin instructions. Taking 2 capsules (600 mg) in the am and 300 mg at bedtime     lamoTRIgine (LAMICTAL) 150 MG tablet Take 150 mg by mouth daily after breakfast.      metFORMIN (GLUCOPHAGE) 500 MG tablet Take 500 mg by mouth 2 (two) times daily with a meal.      oxymetazoline (AFRIN) 0.05 % nasal spray Place 1 spray into both nostrils 2 (two) times daily as needed for congestion.     rivaroxaban (XARELTO) 20 MG TABS tablet Take 1 tablet (20 mg total) by mouth daily with supper. 30 tablet 0   rOPINIRole (REQUIP) 4 MG tablet Take 8-12 mg by mouth at bedtime.     traMADol (ULTRAM) 50 MG tablet Take 50 mg by mouth as needed.     TRINTELLIX 20 MG  TABS tablet Take 20 mg by mouth daily.     Vitamin D, Ergocalciferol, (DRISDOL) 1.25 MG (50000 UNIT) CAPS capsule Take 50,000 Units by mouth once a week.     No facility-administered medications prior to visit.    Review of Systems  Constitutional:  Negative for chills, fever, malaise/fatigue and weight loss.  HENT:  Negative for hearing loss, sore throat and tinnitus.   Eyes:  Negative for blurred vision and double vision.  Respiratory:  Positive for shortness of breath. Negative for cough, hemoptysis, sputum production, wheezing and stridor.   Cardiovascular:  Positive for leg swelling. Negative for chest pain, palpitations, orthopnea and PND.  Gastrointestinal:  Negative for abdominal pain, constipation, diarrhea, heartburn, nausea and vomiting.  Genitourinary:  Negative for dysuria, hematuria and urgency.  Musculoskeletal:  Negative for joint pain and myalgias.  Skin:  Negative for itching and rash.  Neurological:  Negative for dizziness, tingling, weakness and headaches.  Endo/Heme/Allergies:  Negative for environmental allergies. Does not bruise/bleed easily.   Psychiatric/Behavioral:  Negative for depression. The patient is not nervous/anxious and does not have insomnia.   All other systems reviewed and are negative.   Objective:  Physical Exam Vitals reviewed.  Constitutional:      General: She is not in acute distress.    Appearance: She is well-developed. She is obese.  HENT:     Head: Normocephalic and atraumatic.  Eyes:     General: No scleral icterus.    Conjunctiva/sclera: Conjunctivae normal.     Pupils: Pupils are equal, round, and reactive to light.  Neck:     Vascular: No JVD.     Trachea: No tracheal deviation.  Cardiovascular:     Rate and Rhythm: Normal rate and regular rhythm.     Heart sounds: Normal heart sounds. No murmur heard. Pulmonary:     Effort: Pulmonary effort is normal. No tachypnea, accessory muscle usage or respiratory distress.     Breath sounds: No stridor. No wheezing, rhonchi or rales.  Abdominal:     General: Bowel sounds are normal. There is no distension.     Palpations: Abdomen is soft.     Tenderness: There is no abdominal tenderness.  Musculoskeletal:        General: No tenderness.     Cervical back: Neck supple.     Right lower leg: Edema present.     Left lower leg: Edema present.  Lymphadenopathy:     Cervical: No cervical adenopathy.  Skin:    General: Skin is warm and dry.     Capillary Refill: Capillary refill takes less than 2 seconds.     Findings: No rash.  Neurological:     Mental Status: She is alert and oriented to person, place, and time.  Psychiatric:        Behavior: Behavior normal.     There were no vitals filed for this visit.    on RA BMI Readings from Last 3 Encounters:  06/27/21 47.99 kg/m  06/14/21 48.23 kg/m  06/11/21 47.63 kg/m   Wt Readings from Last 3 Encounters:  06/27/21 288 lb 6 oz (130.8 kg)  06/14/21 289 lb 12.8 oz (131.5 kg)  06/11/21 286 lb 3.2 oz (129.8 kg)     CBC    Component Value Date/Time   WBC 7.8 06/27/2021 1457   WBC 6.7  06/05/2021 1828   RBC 4.46 06/27/2021 1457   HGB 12.4 06/27/2021 1457   HGB 12.5 04/18/2020 1326   HCT 39.4 06/27/2021 1457  HCT 37.7 04/18/2020 1326   PLT 252 06/27/2021 1457   PLT 265 04/18/2020 1326   MCV 88.3 06/27/2021 1457   MCV 85 04/18/2020 1326   MCH 27.8 06/27/2021 1457   MCHC 31.5 06/27/2021 1457   RDW 14.7 06/27/2021 1457   RDW 13.4 04/18/2020 1326   LYMPHSABS 2.3 06/27/2021 1457   MONOABS 0.4 06/27/2021 1457   EOSABS 0.5 06/27/2021 1457   BASOSABS 0.1 06/27/2021 1457      Chest Imaging: CTA chest 01/29/2021: No pulmonary emboli.  No acute parenchymal abnormality. The patient's images have been independently reviewed by me.    Pulmonary Functions Testing Results: No flowsheet data found.  FeNO:   Pathology:   Echocardiogram:   Heart Catheterization:     Assessment & Plan:   No diagnosis found.   Discussion:  56 year old morbidly obese female, BMI 51, recurrent pulmonary emboli.  She is had 3 episodes in her lifetime.  1 secondary to a surgery, second unprovoked, third off anticoagulation with COVID.  She has an echo with moderate right ventricular dysfunction.  Plan: Since last event was in July 2022.  Would recommend at least 6 months of anticoagulation followed by a VQ scan. After the VQ scan we will have her follow-up with one of her pulmonary hypertension specialist Dr. Silas Flood to review results. Since she has had 3 separate VTE events may need to be followed closely for any evidence or the development of pulmonary hypertension. Patient is agreeable to this plan.    Current Outpatient Medications:    albuterol (VENTOLIN HFA) 108 (90 Base) MCG/ACT inhaler, Inhale 2 puffs into the lungs every 6 (six) hours as needed for wheezing or shortness of breath., Disp: , Rfl:    cloNIDine (CATAPRES) 0.3 MG tablet, clonidine HCl 0.3 mg tablet  TAKE 1 TABLET BY MOUTH EVERYDAY AT BEDTIME, Disp: , Rfl:    dextromethorphan (DELSYM) 30 MG/5ML liquid, Take  15 mg by mouth as needed for cough., Disp: , Rfl:    esomeprazole (NEXIUM) 40 MG capsule, 1 capsule, Disp: , Rfl:    famotidine (PEPCID) 20 MG tablet, Take 20 mg by mouth 2 (two) times daily., Disp: , Rfl:    gabapentin (NEURONTIN) 300 MG capsule, Take 300-600 mg by mouth See admin instructions. Taking 2 capsules (600 mg) in the am and 300 mg at bedtime, Disp: , Rfl:    lamoTRIgine (LAMICTAL) 150 MG tablet, Take 150 mg by mouth daily after breakfast. , Disp: , Rfl:    metFORMIN (GLUCOPHAGE) 500 MG tablet, Take 500 mg by mouth 2 (two) times daily with a meal. , Disp: , Rfl:    oxymetazoline (AFRIN) 0.05 % nasal spray, Place 1 spray into both nostrils 2 (two) times daily as needed for congestion., Disp: , Rfl:    rivaroxaban (XARELTO) 20 MG TABS tablet, Take 1 tablet (20 mg total) by mouth daily with supper., Disp: 30 tablet, Rfl: 0   rOPINIRole (REQUIP) 4 MG tablet, Take 8-12 mg by mouth at bedtime., Disp: , Rfl:    traMADol (ULTRAM) 50 MG tablet, Take 50 mg by mouth as needed., Disp: , Rfl:    TRINTELLIX 20 MG TABS tablet, Take 20 mg by mouth daily., Disp: , Rfl:    Vitamin D, Ergocalciferol, (DRISDOL) 1.25 MG (50000 UNIT) CAPS capsule, Take 50,000 Units by mouth once a week., Disp: , Rfl:    Garner Nash, DO [facility] 06/28/2021 10:34 AM

## 2021-06-29 ENCOUNTER — Other Ambulatory Visit: Payer: Commercial Managed Care - PPO

## 2021-06-29 LAB — CARDIOLIPIN ANTIBODIES, IGG, IGM, IGA
Anticardiolipin IgA: <9 APL U/mL (ref 0–11)
Anticardiolipin IgG: 10 GPL U/mL (ref 0–14)
Anticardiolipin IgM: <9 MPL U/mL (ref 0–12)

## 2021-07-02 ENCOUNTER — Other Ambulatory Visit: Payer: Self-pay | Admitting: Obstetrics and Gynecology

## 2021-07-02 ENCOUNTER — Ambulatory Visit: Payer: Commercial Managed Care - PPO

## 2021-07-02 ENCOUNTER — Other Ambulatory Visit: Payer: Self-pay

## 2021-07-02 ENCOUNTER — Ambulatory Visit
Admission: RE | Admit: 2021-07-02 | Discharge: 2021-07-02 | Disposition: A | Discharge status: Home / Self Care | Payer: Commercial Managed Care - PPO | Source: Ambulatory Visit | Attending: Obstetrics and Gynecology | Admitting: Obstetrics and Gynecology

## 2021-07-02 DIAGNOSIS — N6310 Unspecified lump in the right breast, unspecified quadrant: Secondary | ICD-10-CM

## 2021-07-02 DIAGNOSIS — N6311 Unspecified lump in the right breast, upper outer quadrant: Secondary | ICD-10-CM

## 2021-07-03 ENCOUNTER — Telehealth: Payer: Self-pay | Admitting: Hematology and Oncology

## 2021-07-03 NOTE — Telephone Encounter (Signed)
Sch per 2/21 inbasket, left msg °

## 2021-07-04 ENCOUNTER — Encounter: Payer: Self-pay | Admitting: Adult Health

## 2021-07-04 ENCOUNTER — Other Ambulatory Visit: Payer: Self-pay

## 2021-07-04 ENCOUNTER — Ambulatory Visit (INDEPENDENT_AMBULATORY_CARE_PROVIDER_SITE_OTHER): Payer: Commercial Managed Care - PPO | Admitting: Adult Health

## 2021-07-04 ENCOUNTER — Ambulatory Visit
Admission: RE | Admit: 2021-07-04 | Discharge: 2021-07-04 | Disposition: A | Discharge status: Home / Self Care | Payer: Commercial Managed Care - PPO | Source: Ambulatory Visit | Attending: Gastroenterology | Admitting: Gastroenterology

## 2021-07-04 VITALS — BP 130/72 | HR 101 | Temp 98.4°F | Ht 65.0 in | Wt 280.0 lb

## 2021-07-04 DIAGNOSIS — J9611 Chronic respiratory failure with hypoxia: Secondary | ICD-10-CM | POA: Diagnosis not present

## 2021-07-04 DIAGNOSIS — I2699 Other pulmonary embolism without acute cor pulmonale: Secondary | ICD-10-CM

## 2021-07-04 DIAGNOSIS — K7460 Unspecified cirrhosis of liver: Secondary | ICD-10-CM

## 2021-07-04 DIAGNOSIS — J189 Pneumonia, unspecified organism: Secondary | ICD-10-CM | POA: Diagnosis not present

## 2021-07-04 DIAGNOSIS — R0902 Hypoxemia: Secondary | ICD-10-CM

## 2021-07-04 MED ORDER — PREDNISONE 10 MG PO TABS: ORAL_TABLET | ORAL | 0 refills | Status: DC
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
## 2021-07-04 NOTE — Patient Instructions (Addendum)
Prednisone 20mg  daily for 1 week and then 10mg  daily for 1 week then 5mg  for 1 week and stop.  Begin Oxygen 2l/m with activity.  Order for POC  Continue on CPAP At bedtime   Mucinex DM Twice daily  As needed  cough/congestion .  Continue on Xarelto 20mg  daily.  No NSAIDS.  Report any bleeding .  Activity as tolerated.  Follow up in 2 weeks with Dr. and As needed  (30 min slot) -with PFT  Please contact office for sooner follow up if symptoms do not improve or worsen or seek emergency care

## 2021-07-09 DIAGNOSIS — J9611 Chronic respiratory failure with hypoxia: Secondary | ICD-10-CM | POA: Insufficient documentation

## 2021-07-09 NOTE — Assessment & Plan Note (Signed)
Patient with exertional hypoxemia questionable etiology.  There was no evidence of PE or chronic PE on VQ scan.  2D echo was returned back to normal with no evidence of pulmonary hypertension.  Patient could have some hypoventilation secondary to restrictive lung disease due to obesity as BMI is at 46.  Also has some increase groundglass/mosaic attenuation on CT chest this could be some underlying pneumonitis.  She has had COVID 19.  She could have some postinflammatory scarring.  For now we will hold off on high-resolution CT chest.  PFTs are pending.  We will begin oxygen with activity to maintain O2 saturation greater than 88 to 90%.  POC is been ordered  Plan  Patient Instructions  Prednisone 20mg  daily for 1 week and then 10mg  daily for 1 week then 5mg  for 1 week and stop.  Begin Oxygen 2l/m with activity.  Order for POC  Continue on CPAP At bedtime   Mucinex DM Twice daily  As needed  cough/congestion .  Continue on Xarelto 20mg  daily.  No NSAIDS.  Report any bleeding .  Activity as tolerated.  Follow up in 2 weeks with Dr. Silas Flood and As needed  (30 min slot) -with PFT  Please contact office for sooner follow up if symptoms do not improve or worsen or seek emergency care      '

## 2021-07-09 NOTE — Assessment & Plan Note (Signed)
Patient has had COVID-19 x2.  CT chest does show some mosaic attenuation this could be some on underlying pneumonitis.  She did have a clinical improvement on short steroid taper.  Autoimmune and connective tissue work-up has been unrevealing.  She is scheduled for PFTs that are pending in 2 weeks. We will give a short prednisone taper.  Steroid education was given  Plan  Patient Instructions  Prednisone 20mg  daily for 1 week and then 10mg  daily for 1 week then 5mg  for 1 week and stop.  Begin Oxygen 2l/m with activity.  Order for POC  Continue on CPAP At bedtime   Mucinex DM Twice daily  As needed  cough/congestion .  Continue on Xarelto 20mg  daily.  No NSAIDS.  Report any bleeding .  Activity as tolerated.  Follow up in 2 weeks with Dr. and As needed  (30 min slot) -with PFT  Please contact office for sooner follow up if symptoms do not improve or worsen or seek emergency care

## 2021-07-09 NOTE — Assessment & Plan Note (Signed)
History of recurrent PE.  Continue on lifelong anticoagulation. Patient has been seen by hematology.  Repeat 2D echo showed normal RV size and normal pulmonary artery pressure. Recent VQ scan showed no acute or chronic PE. Recent CT chest January 2023 showed no PE.  Plan  Patient Instructions  Prednisone 20mg  daily for 1 week and then 10mg  daily for 1 week then 5mg  for 1 week and stop.  Begin Oxygen 2l/m with activity.  Order for POC  Continue on CPAP At bedtime   Mucinex DM Twice daily  As needed  cough/congestion .  Continue on Xarelto 20mg  daily.  No NSAIDS.  Report any bleeding .  Activity as tolerated.  Follow up in 2 weeks with Dr. Silas Flood and As needed  (30 min slot) -with PFT  Please contact office for sooner follow up if symptoms do not improve or worsen or seek emergency care

## 2021-07-12 ENCOUNTER — Emergency Department (HOSPITAL_BASED_OUTPATIENT_CLINIC_OR_DEPARTMENT_OTHER)
Admission: EM | Admit: 2021-07-12 | Discharge: 2021-07-12 | Disposition: A | Discharge status: Home / Self Care | Payer: Commercial Managed Care - PPO | Attending: Emergency Medicine | Admitting: Emergency Medicine

## 2021-07-12 ENCOUNTER — Encounter (HOSPITAL_BASED_OUTPATIENT_CLINIC_OR_DEPARTMENT_OTHER): Payer: Self-pay

## 2021-07-12 ENCOUNTER — Other Ambulatory Visit: Payer: Self-pay

## 2021-07-12 ENCOUNTER — Emergency Department (HOSPITAL_BASED_OUTPATIENT_CLINIC_OR_DEPARTMENT_OTHER): Payer: Commercial Managed Care - PPO

## 2021-07-12 DIAGNOSIS — Z86711 Personal history of pulmonary embolism: Secondary | ICD-10-CM | POA: Insufficient documentation

## 2021-07-12 DIAGNOSIS — R1030 Lower abdominal pain, unspecified: Secondary | ICD-10-CM | POA: Insufficient documentation

## 2021-07-12 DIAGNOSIS — R1110 Vomiting, unspecified: Secondary | ICD-10-CM | POA: Diagnosis present

## 2021-07-12 DIAGNOSIS — Z7901 Long term (current) use of anticoagulants: Secondary | ICD-10-CM | POA: Insufficient documentation

## 2021-07-12 DIAGNOSIS — R519 Headache, unspecified: Secondary | ICD-10-CM | POA: Insufficient documentation

## 2021-07-12 DIAGNOSIS — Z20822 Contact with and (suspected) exposure to covid-19: Secondary | ICD-10-CM | POA: Insufficient documentation

## 2021-07-12 DIAGNOSIS — R0602 Shortness of breath: Secondary | ICD-10-CM | POA: Insufficient documentation

## 2021-07-12 DIAGNOSIS — M546 Pain in thoracic spine: Secondary | ICD-10-CM | POA: Insufficient documentation

## 2021-07-12 LAB — URINALYSIS, ROUTINE W REFLEX MICROSCOPIC
Bilirubin Urine: NEGATIVE
Glucose, UA: NEGATIVE mg/dL
Hgb urine dipstick: NEGATIVE
Ketones, ur: NEGATIVE mg/dL
Leukocytes,Ua: NEGATIVE
Nitrite: NEGATIVE
Protein, ur: NEGATIVE mg/dL
Specific Gravity, Urine: 1.009 (ref 1.005–1.030)
pH: 5 (ref 5.0–8.0)

## 2021-07-12 LAB — CBC
HCT: 44.9 % (ref 36.0–46.0)
Hemoglobin: 14 g/dL (ref 12.0–15.0)
MCH: 27.1 pg (ref 26.0–34.0)
MCHC: 31.2 g/dL (ref 30.0–36.0)
MCV: 86.8 fL (ref 80.0–100.0)
Platelets: 303 10*3/uL (ref 150–400)
RBC: 5.17 MIL/uL — ABNORMAL HIGH (ref 3.87–5.11)
RDW: 14.4 % (ref 11.5–15.5)
WBC: 7.6 10*3/uL (ref 4.0–10.5)
nRBC: 0.3 % — ABNORMAL HIGH (ref 0.0–0.2)

## 2021-07-12 LAB — COMPREHENSIVE METABOLIC PANEL
ALT: 19 U/L (ref 0–44)
AST: 17 U/L (ref 15–41)
Albumin: 4.9 g/dL (ref 3.5–5.0)
Alkaline Phosphatase: 75 U/L (ref 38–126)
Anion gap: 10 (ref 5–15)
BUN: 21 mg/dL — ABNORMAL HIGH (ref 6–20)
CO2: 33 mmol/L — ABNORMAL HIGH (ref 22–32)
Calcium: 9.4 mg/dL (ref 8.9–10.3)
Chloride: 101 mmol/L (ref 98–111)
Creatinine, Ser: 1.02 mg/dL — ABNORMAL HIGH (ref 0.44–1.00)
GFR, Estimated: >60 mL/min (ref >60)
Glucose, Bld: 136 mg/dL — ABNORMAL HIGH (ref 70–99)
Potassium: 3.7 mmol/L (ref 3.5–5.1)
Sodium: 144 mmol/L (ref 135–145)
Total Bilirubin: 0.5 mg/dL (ref 0.3–1.2)
Total Protein: 7.9 g/dL (ref 6.5–8.1)

## 2021-07-12 LAB — RESP PANEL BY RT-PCR (FLU A&B, COVID) ARPGX2
Influenza A by PCR: NEGATIVE
Influenza B by PCR: NEGATIVE
SARS Coronavirus 2 by RT PCR: NEGATIVE

## 2021-07-12 LAB — LIPASE, BLOOD: Lipase: 76 U/L — ABNORMAL HIGH (ref 11–51)

## 2021-07-12 LAB — BRAIN NATRIURETIC PEPTIDE: B Natriuretic Peptide: 72.9 pg/mL (ref 0.0–100.0)

## 2021-07-12 MED ORDER — ONDANSETRON HCL 4 MG/2ML IJ SOLN
4.0000 mg | Freq: Once | INTRAMUSCULAR | Status: AC
  Administered 2021-07-12: 4 mg via INTRAVENOUS
  Filled 2021-07-12: qty 2

## 2021-07-12 MED ORDER — ONDANSETRON 4 MG PO TBDP: ORAL_TABLET | ORAL | 0 refills | Status: DC

## 2021-07-12 MED ORDER — HYDROMORPHONE HCL 1 MG/ML IJ SOLN
1.0000 mg | Freq: Once | INTRAMUSCULAR | Status: AC
  Administered 2021-07-12: 1 mg via INTRAVENOUS
  Filled 2021-07-12: qty 1

## 2021-07-12 NOTE — ED Triage Notes (Signed)
Abdominal pain and generalized body aches  for the last three days with nausea and vomiting. Abdominal pain radiates to right flank. Decreased urination with adequate intact.  ?

## 2021-07-12 NOTE — ED Notes (Signed)
Pt has a hx of sleep apnea. O2 saturation dropped to 79%. Pt placed on 2L Sac. O2 saturation returned to 99%.  ?

## 2021-07-12 NOTE — ED Provider Notes (Signed)
?[facility] EMERGENCY DEPT ?Provider Note ? ? ?CSN: [id number] ?Arrival date & time: 07/12/21  9628 ? ?  ? ?History ? ?Chief Complaint  ?Patient presents with  ? Generalized Body Aches  ? ? ?Erika Woods is a 56 y.o. female. ? ?56 year old female who presents to the emergency department today with right-sided flank pain, abdominal pain, headache and multiple episodes of nonbloody nonbilious emesis tonight.  Patient states that she has been having the pains off and on for a while and to be worked up by her doctor for kidney issues.  She also has a history of pulmonary emboli but is compliant with his Xarelto this does not feel similar to that.  She has had some mild worsening of her shortness of breath.  She states that she had a drop in her creatinine recently which apparently seems to be recovering and she is been following with her doctor for that.  No recent trauma.  No other recent illnesses.  No sick contacts.  No diarrhea or constipation.  She has suprapubic discomfort that feels like she is not emptying her bladder however she has no dysuria or other urinary changes.  She has increased her fluid intake significantly recently with a concern for her kidneys. ? ? ? ?  ? ?Home Medications ?Prior to Admission medications   ?Medication Sig Start Date End Date Taking? Authorizing Provider  ?ondansetron (ZOFRAN-ODT) 4 MG disintegrating tablet 4mg  ODT q4 hours prn nausea/vomit 07/12/21  Yes Reyaansh Merlo, 09/11/21, MD  ?albuterol (VENTOLIN HFA) 108 (90 Base) MCG/ACT inhaler Inhale 2 puffs into the lungs every 6 (six) hours as needed for wheezing or shortness of breath.    [provider]  ?cloNIDine (CATAPRES) 0.3 MG tablet clonidine HCl 0.3 mg tablet ? TAKE 1 TABLET BY MOUTH EVERYDAY AT BEDTIME    [provider]  ?dextromethorphan (DELSYM) 30 MG/5ML liquid Take 15 mg by mouth as needed for cough.    [provider]  ?gabapentin (NEURONTIN) 300 MG capsule Take 300-600 mg by mouth See  admin instructions. Taking 2 capsules (600 mg) in the am and 300 mg at bedtime 04/01/20   [provider]  ?lamoTRIgine (LAMICTAL) 150 MG tablet Take 150 mg by mouth daily after breakfast.  06/30/18   [provider]  ?metFORMIN (GLUCOPHAGE) 500 MG tablet Take 500 mg by mouth 2 (two) times daily with a meal.  10/28/13   [provider]  ?oxymetazoline (AFRIN) 0.05 % nasal spray Place 1 spray into both nostrils 2 (two) times daily as needed for congestion.    [provider]  ?predniSONE (DELTASONE) 10 MG tablet 2 tabs daily for 1 week then 1 tab daily for 1 week and 1/2 tab daily for 1 week. 07/04/21   Parrett, 07/06/21, NP  ?rivaroxaban (XARELTO) 20 MG TABS tablet Take 1 tablet (20 mg total) by mouth daily with supper. 03/13/21   13/2/22, MD  ?rOPINIRole (REQUIP) 4 MG tablet Take 8-12 mg by mouth at bedtime. 11/03/20   [provider]  ?traMADol (ULTRAM) 50 MG tablet Take 50 mg by mouth as needed.    [provider]  ?TRINTELLIX 20 MG TABS tablet Take 20 mg by mouth daily. 05/13/19   [provider]  ?Vitamin D, Ergocalciferol, (DRISDOL) 1.25 MG (50000 UNIT) CAPS capsule Take 50,000 Units by mouth once a week. 05/25/21   [provider]  ?   ? ?Allergies    ?Sulfa antibiotics   ? ?Review of Systems   ?  Review of Systems ? ?Physical Exam ?Updated Vital Signs ?BP 103/67   Pulse (!) 54   Temp 97.8 ?F (36.6 ?C) (Oral)   Resp 15   Ht 5\' 4"  (1.626 m)   Wt 125.6 kg   LMP 01/29/2013   SpO2 99%   BMI 47.55 kg/m?  ?Physical Exam ?Vitals and nursing note reviewed.  ?Constitutional:   ?   Appearance: She is well-developed.  ?HENT:  ?   Head: Normocephalic and atraumatic.  ?   Nose: No congestion or rhinorrhea.  ?Eyes:  ?   Pupils: Pupils are equal, round, and reactive to light.  ?Cardiovascular:  ?   Rate and Rhythm: Normal rate and regular rhythm.  ?Pulmonary:  ?   Effort: No respiratory distress.  ?   Breath sounds: No stridor.  ?Abdominal:  ?    General: Abdomen is flat. There is no distension.  ?Musculoskeletal:     ?   General: Tenderness (mild right CVA) present. No swelling. Normal range of motion.  ?   Cervical back: Normal range of motion.  ?Skin: ?   General: Skin is warm and dry.  ?Neurological:  ?   General: No focal deficit present.  ?   Mental Status: She is alert.  ? ? ?ED Results / Procedures / Treatments   ?Labs ?(all labs ordered are listed, but only abnormal results are displayed) ?Labs Reviewed  ?LIPASE, BLOOD - Abnormal; Notable for the following components:  ?    Result Value  ? Lipase 76 (*)   ? All other components within normal limits  ?COMPREHENSIVE METABOLIC PANEL - Abnormal; Notable for the following components:  ? CO2 33 (*)   ? Glucose, Bld 136 (*)   ? BUN 21 (*)   ? Creatinine, Ser 1.02 (*)   ? All other components within normal limits  ?CBC - Abnormal; Notable for the following components:  ? RBC 5.17 (*)   ? nRBC 0.3 (*)   ? All other components within normal limits  ?URINALYSIS, ROUTINE W REFLEX MICROSCOPIC - Abnormal; Notable for the following components:  ? Color, Urine COLORLESS (*)   ? All other components within normal limits  ?RESP PANEL BY RT-PCR (FLU A&B, COVID) [id number]  ?URINE CULTURE  ?BRAIN NATRIURETIC PEPTIDE  ? ? ?EKG ?EKG Interpretation ? ?Date/Time:  Friday July 12 2021 05:43:42 EST ?Ventricular Rate:  59 ?PR Interval:  137 ?QRS Duration: 148 ?QT Interval:  459 ?QTC Calculation: 455 ?R Axis:   -47 ?Text Interpretation: Sinus rhythm Atrial premature complexes RBBB and LAFB Confirmed by Norman Clay ([id number]) on 07/12/2021 7:06:55 AM ? ?Radiology ?No results found. ? ?Procedures ?Procedures  ? ? ?Medications Ordered in ED ?Medications  ?HYDROmorphone (DILAUDID) injection 1 mg (1 mg Intravenous Given 07/12/21 0545)  ?ondansetron [facility]) injection 4 mg (4 mg Intravenous Given 07/12/21 0545)  ? ? ?ED Course/ Medical Decision Making/ A&P ?  ?                        ?Medical Decision Making ?Amount and/or Complexity of Data  Reviewed ?Labs: ordered. ?Radiology: ordered. ? ?Risk ?Prescription drug management. ? ? ?Unclear etiology of the patient's symptoms.  Her kidney function seems to be fine today CT scan does say something about a few air bubbles in the bladder and she does have suprapubic discomfort so the culture will be sent to ensure she does not have some type of bacterial infection is not picking up on the urinalysis  and she is urinating so much from increased fluid intake.  Her pains in proved.  Her oxygen was slightly low but she wears oxygen as needed at home I suspect this is related that is there is no other reason for it.  She has been drinking fluids without difficulty.  She states her nausea seems to be improved.  She had some crackers as well.  Rest of her work-up is reassuring.  She will continue to follow-up with her primary doctor for her symptoms ? ? ?Final Clinical Impression(s) / ED Diagnoses ?Final diagnoses:  ?Vomiting, unspecified vomiting type, unspecified whether nausea present  ?Right-sided thoracic back pain, unspecified chronicity  ? ? ?Rx / DC Orders ?ED Discharge Orders   ? ?      Ordered  ?  ondansetron (ZOFRAN-ODT) 4 MG disintegrating tablet       ? 07/12/21 0707  ? ?  ?  ? ?  ? ? ?  ?Marily Memos, MD ?07/12/21 [phone] ? ?

## 2021-07-13 LAB — URINE CULTURE: Culture: 80000 — AB

## 2021-07-14 ENCOUNTER — Telehealth (HOSPITAL_BASED_OUTPATIENT_CLINIC_OR_DEPARTMENT_OTHER): Payer: Self-pay | Admitting: *Deleted

## 2021-07-14 NOTE — Telephone Encounter (Signed)
Post ED Visit - Positive Culture Follow-up: Successful Patient Follow-Up ? ?Culture assessed and recommendations reviewed by: ? ?[x]   Bertis Ruddy, Pharm.D. ?[]  Heide Guile, Pharm.D., BCPS AQ-ID ?[]  Parks Neptune, Pharm.D., BCPS ?[]  Alycia Rossetti, Pharm.D., BCPS ?[]  Minh Pham, Pharm.D., BCPS, AAHIVP ?[]  Legrand Como, Pharm.D., BCPS, AAHIVP ?[]  Salome Arnt, PharmD, BCPS ?[]  Johnnette Gourd, PharmD, BCPS ?[]  Hughes Better, PharmD, BCPS ?[]  Leeroy Cha, PharmD ? ?Positive urine culture ? ?[x]  Patient discharged without antimicrobial prescription and treatment is now indicated ?[]  Organism is resistant to prescribed ED discharge antimicrobial ?[]  Patient with positive blood cultures ? ?Changes discussed with ED provider: Suella Broad, PA ? ?  Pt is symptomatic for UTI . Pt had already reached out to her primary care MD who has already ordered an antibiotic for her. Pt did not want new antibiotic ordered.  ? ? ?Contacted patient, date 07/14/21, time 1049 ? ? ?Erika Woods ?07/14/2021, 10:55 AM ? ?  ?

## 2021-07-14 NOTE — Progress Notes (Signed)
ED Antimicrobial Stewardship Positive Culture Follow Up  ? ?Erika Woods is an 56 y.o. female who presented to [facility] on 07/12/2021 with a chief complaint of  ?Chief Complaint  ?Patient presents with  ? Generalized Body Aches  ? ? ?Recent Results (from the past 720 hour(s))  ?Resp Panel by RT-PCR (Flu A&B, Covid) Nasopharyngeal Swab     Status: None  ? Collection Time: 07/12/21  5:04 AM  ? Specimen: Nasopharyngeal Swab; Nasopharyngeal(NP) swabs in vial transport medium  ?Result Value Ref Range Status  ? SARS Coronavirus 2 by RT PCR NEGATIVE NEGATIVE Final  ?  Comment: (NOTE) ?SARS-CoV-2 target nucleic acids are NOT DETECTED. ? ?The SARS-CoV-2 RNA is generally detectable in upper respiratory ?specimens during the acute phase of infection. The lowest ?concentration of SARS-CoV-2 viral copies this assay can detect is ?138 copies/mL. A negative result does not preclude SARS-Cov-2 ?infection and should not be used as the sole basis for treatment or ?other patient management decisions. A negative result may occur with  ?improper specimen collection/handling, submission of specimen other ?than nasopharyngeal swab, presence of viral mutation(s) within the ?areas targeted by this assay, and inadequate number of viral ?copies(<138 copies/mL). A negative result must be combined with ?clinical observations, patient history, and epidemiological ?information. The expected result is Negative. ? ?Fact Sheet for Patients:  ?BloggerCourse.com ? ?Fact Sheet for Healthcare Providers:  ?SeriousBroker.it ? ?This test is no t yet approved or cleared by the [place] FDA and  ?has been authorized for detection and/or diagnosis of SARS-CoV-2 by ?FDA under an Emergency Use Authorization (EUA). This EUA will remain  ?in effect (meaning this test can be used) for the duration of the ?COVID-19 declaration under Section 564(b)(1) of the Act, 21 ?U.S.C.section 360bbb-3(b)(1), unless the  authorization is terminated  ?or revoked sooner.  ? ? ?  ? Influenza A by PCR NEGATIVE NEGATIVE Final  ? Influenza B by PCR NEGATIVE NEGATIVE Final  ?  Comment: (NOTE) ?The Xpert Xpress SARS-CoV-2/FLU/RSV plus assay is intended as an aid ?in the diagnosis of influenza from Nasopharyngeal swab specimens and ?should not be used as a sole basis for treatment. Nasal washings and ?aspirates are unacceptable for Xpert Xpress SARS-CoV-2/FLU/RSV ?testing. ? ?Fact Sheet for Patients: ?BloggerCourse.com ? ?Fact Sheet for Healthcare Providers: ?SeriousBroker.it ? ?This test is not yet approved or cleared by the [place] FDA and ?has been authorized for detection and/or diagnosis of SARS-CoV-2 by ?FDA under an Emergency Use Authorization (EUA). This EUA will remain ?in effect (meaning this test can be used) for the duration of the ?COVID-19 declaration under Section 564(b)(1) of the Act, 21 U.S.C. ?section 360bbb-3(b)(1), unless the authorization is terminated or ?revoked. ? ?Performed at [facility], [address] ?[place], [place] [address] ?  ?Urine Culture     Status: Abnormal  ? Collection Time: 07/12/21  6:56 AM  ? Specimen: Urine, Clean Catch  ?Result Value Ref Range Status  ? Specimen Description   Final  ?  URINE, CLEAN CATCH ?Performed at [facility], [address] [place], [place] [address] ?  ? Special Requests   Final  ?  NONE ?Performed at [facility], [address] [place], [place] [address] ?  ? Culture (A)  Final  ?  80,000 COLONIES/mL STREPTOCOCCUS AGALACTIAE ?TESTING AGAINST S. AGALACTIAE NOT ROUTINELY PERFORMED DUE TO PREDICTABILITY OF AMP/PEN/VAN SUSCEPTIBILITY. ?Performed at [facility], [address], [place], [place] [address] ?  ? Report Status 07/13/2021  FINAL  Final  ? ? ?Plan: Symptom check, if symptomatic give Keflex 500 mg TID x 7d ? ?ED Provider: Army Melia,  PA-C ? ? ?Erika Woods ?07/14/2021, 10:08 AM ?Clinical Pharmacist ?Monday - Friday phone -  [phone] ?Saturday - Sunday phone - [phone] ? ?

## 2021-07-18 ENCOUNTER — Other Ambulatory Visit: Payer: Self-pay | Admitting: *Deleted

## 2021-07-18 DIAGNOSIS — J9611 Chronic respiratory failure with hypoxia: Secondary | ICD-10-CM

## 2021-07-19 ENCOUNTER — Ambulatory Visit (INDEPENDENT_AMBULATORY_CARE_PROVIDER_SITE_OTHER): Payer: Commercial Managed Care - PPO | Admitting: Pulmonary Disease

## 2021-07-19 ENCOUNTER — Encounter: Payer: Self-pay | Admitting: Pulmonary Disease

## 2021-07-19 ENCOUNTER — Other Ambulatory Visit: Payer: Self-pay

## 2021-07-19 VITALS — BP 128/70 | HR 73 | Temp 98.4°F | Ht 64.0 in | Wt 283.0 lb

## 2021-07-19 DIAGNOSIS — R0902 Hypoxemia: Secondary | ICD-10-CM

## 2021-07-19 DIAGNOSIS — J9611 Chronic respiratory failure with hypoxia: Secondary | ICD-10-CM

## 2021-07-19 DIAGNOSIS — I2699 Other pulmonary embolism without acute cor pulmonale: Secondary | ICD-10-CM

## 2021-07-19 LAB — PULMONARY FUNCTION TEST
DL/VA % pred: 122 %
DL/VA: 5.22 ml/min/mmHg/L
DLCO cor % pred: 93 %
DLCO cor: 19.22 ml/min/mmHg
DLCO unc % pred: 95 %
DLCO unc: 19.57 ml/min/mmHg
FEF 25-75 Post: 3 L/sec
FEF 25-75 Pre: 2.53 L/sec
FEF2575-%Change-Post: 18 %
FEF2575-%Pred-Post: 119 %
FEF2575-%Pred-Pre: 100 %
FEV1-%Change-Post: 1 %
FEV1-%Pred-Post: 79 %
FEV1-%Pred-Pre: 77 %
FEV1-Post: 2.1 L
FEV1-Pre: 2.07 L
FEV1FVC-%Change-Post: 4 %
FEV1FVC-%Pred-Pre: 109 %
FEV6-%Change-Post: -2 %
FEV6-%Pred-Post: 70 %
FEV6-%Pred-Pre: 72 %
FEV6-Post: 2.33 L
FEV6-Pre: 2.39 L
FEV6FVC-%Pred-Post: 103 %
FEV6FVC-%Pred-Pre: 103 %
FVC-%Change-Post: -2 %
FVC-%Pred-Post: 68 %
FVC-%Pred-Pre: 70 %
FVC-Post: 2.33 L
FVC-Pre: 2.39 L
Post FEV1/FVC ratio: 90 %
Post FEV6/FVC ratio: 100 %
Pre FEV1/FVC ratio: 87 %
Pre FEV6/FVC Ratio: 100 %
RV % pred: 94 %
RV: 1.81 L
TLC % pred: 82 %
TLC: 4.15 L

## 2021-07-19 MED ORDER — TRELEGY ELLIPTA 200-62.5-25 MCG/ACT IN AEPB: 1.0000 | INHALATION_SPRAY | Freq: Every day | RESPIRATORY_TRACT | 0 refills | Status: DC

## 2021-07-19 NOTE — Progress Notes (Signed)
Full PFT performed today. °

## 2021-07-19 NOTE — Addendum Note (Signed)
Addended by: Phillips Grout on: 07/19/2021 04:34 PM ? ? Modules accepted: Orders ? ?

## 2021-07-19 NOTE — Patient Instructions (Signed)
Full PFT performed today. °

## 2021-07-19 NOTE — Patient Instructions (Signed)
Nice to meet you ? ?Your pulmonary function test overall are normal, this is very reassuring. ? ?There is subtle signs on your CT scan and your breathing test that a little bit of extra air is trapped in the lungs when you breathe out.  This can contribute to shortness of breath.  To help treat this, use the sample inhaler, Trelegy, 1 puff once a day.  Please rinse your mouth out after every use.  There should be enough doses for 4 weeks.  If you find this to be helpful, please call or send a MyChart message and I can prescribe this. ? ?We will do a brief walk around the office to see if the oxygen drops below 88%, if not, we likely will not need oxygen.  Please continue to check at home as you have been doing. ? ?Return to clinic in 3 months or sooner as needed with Dr. Judeth Horn. ?
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
## 2021-07-23 ENCOUNTER — Ambulatory Visit: Payer: Commercial Managed Care - PPO | Admitting: Pulmonary Disease

## 2021-07-25 ENCOUNTER — Other Ambulatory Visit: Payer: Self-pay | Admitting: Adult Health

## 2021-08-15 ENCOUNTER — Encounter (HOSPITAL_BASED_OUTPATIENT_CLINIC_OR_DEPARTMENT_OTHER): Payer: Self-pay | Admitting: Emergency Medicine

## 2021-08-15 ENCOUNTER — Emergency Department (HOSPITAL_BASED_OUTPATIENT_CLINIC_OR_DEPARTMENT_OTHER): Payer: Commercial Managed Care - PPO

## 2021-08-15 ENCOUNTER — Other Ambulatory Visit: Payer: Self-pay

## 2021-08-15 ENCOUNTER — Emergency Department (HOSPITAL_BASED_OUTPATIENT_CLINIC_OR_DEPARTMENT_OTHER)
Admission: EM | Admit: 2021-08-15 | Discharge: 2021-08-15 | Disposition: A | Discharge status: Home / Self Care | Payer: Commercial Managed Care - PPO | Attending: Emergency Medicine | Admitting: Emergency Medicine

## 2021-08-15 ENCOUNTER — Telehealth: Payer: Self-pay | Admitting: Pulmonary Disease

## 2021-08-15 DIAGNOSIS — M7989 Other specified soft tissue disorders: Secondary | ICD-10-CM

## 2021-08-15 DIAGNOSIS — R600 Localized edema: Secondary | ICD-10-CM | POA: Insufficient documentation

## 2021-08-15 DIAGNOSIS — R7989 Other specified abnormal findings of blood chemistry: Secondary | ICD-10-CM | POA: Insufficient documentation

## 2021-08-15 DIAGNOSIS — Z7901 Long term (current) use of anticoagulants: Secondary | ICD-10-CM | POA: Diagnosis not present

## 2021-08-15 DIAGNOSIS — R001 Bradycardia, unspecified: Secondary | ICD-10-CM | POA: Diagnosis not present

## 2021-08-15 DIAGNOSIS — M79605 Pain in left leg: Secondary | ICD-10-CM | POA: Diagnosis present

## 2021-08-15 HISTORY — DX: Urinary tract infection, site not specified: N39.0

## 2021-08-15 LAB — BASIC METABOLIC PANEL
Anion gap: 8 (ref 5–15)
BUN: 30 mg/dL — ABNORMAL HIGH (ref 6–20)
CO2: 28 mmol/L (ref 22–32)
Calcium: 10.1 mg/dL (ref 8.9–10.3)
Chloride: 102 mmol/L (ref 98–111)
Creatinine, Ser: 1.23 mg/dL — ABNORMAL HIGH (ref 0.44–1.00)
GFR, Estimated: 51 mL/min — ABNORMAL LOW (ref >60)
Glucose, Bld: 91 mg/dL (ref 70–99)
Potassium: 4.8 mmol/L (ref 3.5–5.1)
Sodium: 138 mmol/L (ref 135–145)

## 2021-08-15 LAB — CBC
HCT: 39.2 % (ref 36.0–46.0)
Hemoglobin: 12.3 g/dL (ref 12.0–15.0)
MCH: 27.8 pg (ref 26.0–34.0)
MCHC: 31.4 g/dL (ref 30.0–36.0)
MCV: 88.7 fL (ref 80.0–100.0)
Platelets: 266 10*3/uL (ref 150–400)
RBC: 4.42 MIL/uL (ref 3.87–5.11)
RDW: 14.5 % (ref 11.5–15.5)
WBC: 8.6 10*3/uL (ref 4.0–10.5)
nRBC: 0 % (ref 0.0–0.2)

## 2021-08-15 NOTE — ED Triage Notes (Signed)
Pt via pov from home; sent by her ppc for evaluation of left calf pain x 3 days. Pt has hx of 4 PEs and her pcp was concerned for another. Pt denies any sob, fever; she is alert & oriented, nad noted.  ?

## 2021-08-15 NOTE — ED Notes (Signed)
EMT-P provided AVS using Teachback Method. Patient verbalizes understanding of Discharge Instructions. Opportunity for Questioning and Answers were provided by EMT-P. Patient Discharged from ED.  ? ?

## 2021-08-15 NOTE — Telephone Encounter (Signed)
Called patient but she did not answer. Left message for her to call back.  

## 2021-08-15 NOTE — ED Provider Notes (Signed)
?[facility] EM[facility] DEPT ?Provider Note ? ? ?CSN: [id number] ?Arrival date & time: 08/15/21  1728 ? ?  ? ?History ? ?Chief Complaint  ?Patient presents with  ? calf pain  ? ? ?Erika Woods is a 56 y.o. female. ? ?56 year old female presents today for evaluation of left leg pain and swelling since Monday.  She states the pain worsened yesterday and today.  She called her primary care doctor's office and was told to come into the emergency room to have an ultrasound done.  She has history of multiple PEs and is on chronic anticoagulation with Xarelto.  She reports compliance with Xarelto.  Last dose last night.  Denies any shortness of breath, or chest pain.  Denies recent travel, injury to her left leg, or prolonged period of inactivity. ? ?The history is provided by the patient. No language interpreter was used.  ? ?  ? ?Home Medications ?Prior to Admission medications   ?Medication Sig Start Date End Date Taking? Authorizing Provider  ?cloNIDine (CATAPRES) 0.3 MG tablet clonidine HCl 0.3 mg tablet ? TAKE 1 TABLET BY MOUTH EVERYDAY AT BEDTIME   Yes [provider]  ?dextromethorphan (DELSYM) 30 MG/5ML liquid Take 15 mg by mouth as needed for cough.   Yes [provider]  ?Fluticasone-Umeclidin-Vilant (TRELEGY ELLIPTA) 200-62.5-25 MCG/ACT AEPB Inhale 1 puff into the lungs daily. 07/19/21  Yes Hunsucker, Lesia SagoMatthew R, MD  ?gabapentin (NEURONTIN) 300 MG capsule Take 300-600 mg by mouth See admin instructions. Taking 2 capsules (600 mg) in the am and 300 mg at bedtime 04/01/20  Yes [provider]  ?lamoTRIgine (LAMICTAL) 150 MG tablet Take 150 mg by mouth daily after breakfast.  06/30/18  Yes [provider]  ?metFORMIN (GLUCOPHAGE) 500 MG tablet Take 500 mg by mouth 2 (two) times daily with a meal.  10/28/13  Yes [provider]  ?oxymetazoline (AFRIN) 0.05 % nasal spray Place 1 spray into both nostrils 2 (two) times daily as needed for congestion.   Yes [provider]  ?rivaroxaban (XARELTO) 20 MG TABS tablet Take 1 tablet (20 mg total) by mouth daily with supper. 03/13/21  Yes Leroy SeaSingh, Prashant K, MD  ?rOPINIRole (REQUIP) 4 MG tablet Take 8-12 mg by mouth at bedtime. 11/03/20  Yes [provider]  ?TRINTELLIX 20 MG TABS tablet Take 20 mg by mouth daily. 05/13/19  Yes [provider]  ?Vitamin D, Ergocalciferol, (DRISDOL) 1.25 MG (50000 UNIT) CAPS capsule Take 50,000 Units by mouth once a week. 05/25/21  Yes [provider]  ?albuterol (VENTOLIN HFA) 108 (90 Base) MCG/ACT inhaler Inhale 2 puffs into the lungs every 6 (six) hours as needed for wheezing or shortness of breath.    [provider]  ?ondansetron (ZOFRAN-ODT) 4 MG disintegrating tablet 4mg  ODT q4 hours prn nausea/vomit 07/12/21   Mesner, Barbara CowerJason, MD  ?traMADol (ULTRAM) 50 MG tablet Take 50 mg by mouth as needed.    [provider]  ?   ? ?Allergies    ?Sulfa antibiotics   ? ?Review of Systems   ?Review of Systems  ?Constitutional:  Negative for chills and fever.  ?Respiratory:  Negative for shortness of breath.   ?Cardiovascular:  Positive for leg swelling. Negative for chest pain and palpitations.  ?All other systems reviewed and are negative. ? ?Physical Exam ?Updated Vital Signs ?BP 114/68   Pulse (!) 58   Temp 98.8 ?F (37.1 ?C) (Oral)   Resp 20   Ht 5\' 5"  (1.651 m)  Wt 124.7 kg   LMP 01/29/2013   SpO2 98%   BMI 45.76 kg/m?  ?Physical Exam ?Vitals and nursing note reviewed. Exam conducted with a chaperone present.  ?Constitutional:   ?   General: She is not in acute distress. ?   Appearance: Normal appearance. She is not ill-appearing.  ?HENT:  ?   Head: Normocephalic and atraumatic.  ?   Nose: Nose normal.  ?Eyes:  ?   General: No scleral icterus. ?   Extraocular Movements: Extraocular movements intact.  ?   Conjunctiva/sclera: Conjunctivae normal.  ?Cardiovascular:  ?   Rate and Rhythm: Regular rhythm. Bradycardia present.  ?   Pulses: Normal pulses.  ?    Heart sounds: Normal heart sounds.  ?Pulmonary:  ?   Effort: Pulmonary effort is normal. No respiratory distress.  ?   Breath sounds: Normal breath sounds. No wheezing or rales.  ?Abdominal:  ?   General: There is no distension.  ?   Tenderness: There is no abdominal tenderness.  ?Genitourinary: ?   Labia:     ?   Right: No lesion or injury.     ?   Left: No lesion or injury.   ?   Rectum: No mass or anal fissure.  ?Musculoskeletal:     ?   General: Normal range of motion.  ?   Cervical back: Normal range of motion.  ?   Right lower leg: No edema.  ?   Left lower leg: Edema (Mild nonpitting edema of lower left extremity.) present.  ?   Comments: Tenderness to the anterior portion of the left lower extremity.  2+ DP pulse present.  Brisk cap refill.  Full range of motion in bilateral lower extremities.  ?Skin: ?   General: Skin is warm and dry.  ?Neurological:  ?   General: No focal deficit present.  ?   Mental Status: She is alert. Mental status is at baseline.  ? ? ?ED Results / Procedures / Treatments   ?Labs ?(all labs ordered are listed, but only abnormal results are displayed) ?Labs Reviewed  ?BASIC METABOLIC PANEL - Abnormal; Notable for the following components:  ?    Result Value  ? BUN 30 (*)   ? Creatinine, Ser 1.23 (*)   ? GFR, Estimated 51 (*)   ? All other components within normal limits  ?CBC  ? ? ?EKG ?None ? ?Radiology ?No results found. ? ?Procedures ?Procedures  ? ? ?Medications Ordered in ED ?Medications - No data to display ? ?ED Course/ Medical Decision Making/ A&P ?  ?                        ?Medical Decision Making ?Amount and/or Complexity of Data Reviewed ?Labs: ordered. ? ? ?56 year old female with past medical history of multiple PEs on chronic anticoagulation presents today at request of her PCP office for evaluation of DVT.  Patient reports she has left lower extremity swelling and pain since Monday which worsened yesterday.  She reports compliance with her anticoagulation.  CBC is  unremarkable.  BMP shows mild creatinine elevation at 1.23 likely due to dehydration.  Ultrasound shows no evidence of DVT.  Discussed findings with patient.  Patient is without any evidence of other injury to her left leg.  She is walking without difficulty.  No bruising or injury evident.  She voices understanding.  Patient is appropriate for discharge.  Discharged in stable condition.  Return precautions discussed. ? ?Patient  prior to discharge states after she urinated she saw some bright red blood on the toilet bowl.  This was above the brim of the toilet bowl and not within the toilet bowl itself.  She states this was not present prior prior to her urinating.  Skin exam done of the GU in the rectum with a tech as chaperone present.  No evidence of any external bleeding.  Urine is at bedside without apparent bleeding. ? ? ?Final Clinical Impression(s) / ED Diagnoses ?Final diagnoses:  ?Left leg swelling  ? ? ?Rx / DC Orders ?ED Discharge Orders   ? ? None  ? ?  ? ? ?  ?Marita Kansas, PA-C ?08/15/21 2055 ? ?  ?Arby Barrette, MD ?08/22/21 [phone] ? ?

## 2021-08-15 NOTE — Discharge Instructions (Signed)
Your work-up today was reassuring.  Your ultrasound did not show any evidence of DVT.  Your blood work did show slightly elevated creatinine which is consistent with dehydration.  Increase your hydration.  Your exam today was reassuring and there is no evidence of any other injury to the leg either.  Follow-up with your primary care provider for further work-up and management. ?

## 2021-08-21 NOTE — Telephone Encounter (Signed)
Patient was seen in ED on 08/15/21 and did not have a DVT. Will close encounter.  ?

## 2021-09-09 ENCOUNTER — Telehealth: Payer: Self-pay | Admitting: Pulmonary Disease

## 2021-09-09 NOTE — Telephone Encounter (Signed)
Called patient and she states that she has an appointment with Tammy on Thursday since MH is out till next week. I told her to call office if she needs anything between now and then. Nothing further needed  ?

## 2021-09-12 ENCOUNTER — Ambulatory Visit (INDEPENDENT_AMBULATORY_CARE_PROVIDER_SITE_OTHER): Payer: Commercial Managed Care - PPO | Admitting: Adult Health

## 2021-09-12 ENCOUNTER — Telehealth: Payer: Self-pay | Admitting: Pulmonary Disease

## 2021-09-12 ENCOUNTER — Encounter: Payer: Self-pay | Admitting: Adult Health

## 2021-09-12 DIAGNOSIS — J9611 Chronic respiratory failure with hypoxia: Secondary | ICD-10-CM | POA: Diagnosis not present

## 2021-09-12 DIAGNOSIS — G4730 Sleep apnea, unspecified: Secondary | ICD-10-CM

## 2021-09-12 DIAGNOSIS — J189 Pneumonia, unspecified organism: Secondary | ICD-10-CM | POA: Diagnosis not present

## 2021-09-12 DIAGNOSIS — Z01811 Encounter for preprocedural respiratory examination: Secondary | ICD-10-CM

## 2021-09-12 DIAGNOSIS — I2699 Other pulmonary embolism without acute cor pulmonale: Secondary | ICD-10-CM

## 2021-09-12 NOTE — Telephone Encounter (Signed)
Fax received from Dr. Melrose Nakayama with [facility] to perform a Right knee arthroplasty on patient.  Patient needs surgery clearance. Patient was seen on 09/12/2021. Office protocol is a risk assessment can be sent to surgeon if patient has been seen in 60 days or less.  ? ?Sending to [facility] for risk assessment or recommendations if patient needs to be seen in office prior to surgical procedure.   ?

## 2021-09-12 NOTE — Assessment & Plan Note (Signed)
Resolved

## 2021-09-12 NOTE — Assessment & Plan Note (Signed)
Excellent control compliance on nocturnal CPAP continue current settings ? ?Plan  ?Patient Instructions  ?Continue on CPAP At bedtime   ?Continue on Xarelto 20mg  daily.  ?No NSAIDS.  ?Report any bleeding .  ?Activity as tolerated.  ?Activity as tolerated.  ?Good luck with upcoming knee surgery.  ?Follow up with Dr. in 3 months and As needed   ?Please contact office for sooner follow up if symptoms do not improve or worsen or seek emergency care  ? ?  ?  ? ?

## 2021-09-12 NOTE — Progress Notes (Signed)
? ?@Patient  ID: , female    DOB: [birth year], 56 y.o.   MRN: [id number] ? ?Chief Complaint  ?Patient presents with  ? Follow-up  ? ? ?Referring provider: ?[id number], MD ? ?HPI: ?56 year old female never smoker seen for pulmonary consult January 10, 2021 for recurrent pulmonary embolism.  Patient was first diagnosed with pulmonary emboli in 2019.  Second pulmonary embolism on July 2020.  And her third PE June 2022.  Fourth PE was October 2022.  Recurrence was felt to be due to break in therapy.  She is on lifelong anticoagulation.  Previous work-up with hypercoaguable panel was negative (on anticoagulation therapy.).  ?Medical history significant for sleep apnea on CPAP, diabetes, fatty liver disease, nonalcoholic cirrhosis. ? ?TEST/EVENTS :  ?2D echo November 09, 2020 showed EF at 60-65%, right ventricular systolic function is normal.  RV size is moderately enlarged. ?  ?2D echo April 30, 2021 showed EF at 60 to 65%, right ventricle size is normal.  Right ventricular systolic function is normal, normal pulmonary artery systolic pressure. ?  ?VQ scan May 29, 2021 normal perfusion lung scan ?  ?CT chest June 05, 2021 negative PE.,  Diffuse patchy groundglass densities throughout the lung likely atelectasis. ? ?09/12/2021 Follow up ; PE, Dyspnea , Pre-op risk assessment  ?Patient has had an extensive work-up for recurrent PE.  All of her PE episodes have occurred during times when she was off of her therapy.  She has been seen by hematology and recommended for lifelong anticoagulation therapy.  Her hypercoagulable panel was unrevealing.  Patient says she remains on Xarelto with no known issues.  Endorses compliance.  ? ?She has underlying sleep apnea.  Patient says she is doing well on CPAP wears it every single night.  Feels rested with no significant daytime sleepiness ? ?Patient is also had progressive shortness of breath with decreased activity tolerance.  CT chest January 2023 showed mosaic  attenuation with some groundglass opacities.  She was treated with possible pneumonitis with steroids.  Connective tissue and autoimmune serology was unremarkable. ?Did have some exertional hypoxemia . She was recommended to start on oxygen but did not get seet up . On return office visit , no further exertional desats. Says dyspnea has improved since taking prednisone. She is limited by knee pain so not as active but feels breathing is better.  ? ?Patient is here for a preop pulmonary risk assessment.  Patient is planning for a right total knee replacement mid-to-late June with Dr. July .  She says she works part-time.  But is becoming harder to get around is unable to exercise is really trying to work on weight loss but has reached a plateau because her knee is bothering her so bad.   ?We went over potential pulmonary surgical risk extensively. ?Patient is fully independent at home.  Is able to drive.  Works part-time. ?She has has been on Xarelto for greater than 6 months since her last PE.  CT chest June 05, 2021 showed no PE.  VQ scan May 29, 2021 was negative as well.  Patient endorses compliance of Xarelto.  2D echo showed no evidence of pulmonary hypertension ? ?Patient has underlying sleep apnea.  She says she is doing well on her CPAP.  Patient says she cannot sleep without her CPAP.  She wears her CPAP every night.  She feels rested with decreased daytime sleepiness and feels that she benefits from CPAP.  CPAP download shows excellent compliance with 100%  usage.  Daily average usage at 8 hours.  Patient is on auto CPAP 4 to 16 cm H2O.  AHI 0.6. ? ?Allergies  ?Allergen Reactions  ? Sulfa Antibiotics Hives  ? ? ?Immunization History  ?Administered Date(s) Administered  ? Hep A / Hep B 06/28/2018, 07/28/2018, 12/21/2018  ? Influenza,inj,Quad PF,6+ Mos 03/14/2019, 03/21/2019, 02/19/2021  ? Influenza-Unspecified 01/12/2018  ? PFIZER(Purple Top)SARS-COV-2 Vaccination 09/20/2019, 10/13/2019  ?  Pneumococcal Polysaccharide-23 02/26/2010  ? ? ?Past Medical History:  ?Diagnosis Date  ? Acute pulmonary embolism ([name]) 11/11/2018  ? Acute pulmonary embolism with acute cor pulmonale ([name]) 09/16/2017  ? Acute superficial venous thrombosis of left lower extremity   ? Aortic insufficiency   ? Echocardiogram 08/2019: prob bicuspid AoV, mild to mod AI, mild AS (mean 12 mmHg), EF 55-60, no RWMA, Gr 1 DD, GLS -22.2%, normal RVSF, Ao Root 39 mm  ? Coronary CTA   ? Coronary CTA 08/2019: Calcium score 0, no evidence of CAD, ascending aorta 4 cm  ? Diabetes mellitus without complication ([name])   ? DM (diabetes mellitus) ([name]) 08/18/2011  ? Impaired fasting glucose   ? Mood disorder ([name]) 08/18/2011  ? Obesity, Class III, BMI 40-49.9 (morbid obesity) ([name]) 09/16/2017  ? Obstructive sleep apnea on CPAP   ? Osteoarthritis of knee   ? Personal history of pulmonary embolism   ? PTSD (post-traumatic stress disorder) 08/18/2011  ? Pulmonary emboli ([name]) 09/15/2017  ? Renal disorder   ? kideny stones  ? Restless leg syndrome   ? Severe recurrent major depression without psychotic features ([name]) 02/17/2018  ? Sleep apnea 08/18/2011  ? Thoracic aortic aneurysm ([name])   ? Thoracic aortic aneurysm without rupture ([name])   ? UTI (urinary tract infection)   ? recurrent  ? ? ?Tobacco History: ?Social History  ? ?Tobacco Use  ?Smoking Status Never  ?Smokeless Tobacco Never  ? ?Counseling given: Not Answered ? ? ?Outpatient Medications Prior to Visit  ?Medication Sig Dispense Refill  ? albuterol (VENTOLIN HFA) 108 (90 Base) MCG/ACT inhaler Inhale 2 puffs into the lungs every 6 (six) hours as needed for wheezing or shortness of breath.    ? cloNIDine (CATAPRES) 0.3 MG tablet clonidine HCl 0.3 mg tablet ? TAKE 1 TABLET BY MOUTH EVERYDAY AT BEDTIME    ? dextromethorphan (DELSYM) 30 MG/5ML liquid Take 15 mg by mouth as needed for cough.    ? Fluticasone-Umeclidin-Vilant (TRELEGY ELLIPTA) 200-62.5-25 MCG/ACT AEPB Inhale 1 puff into the lungs daily.  28 each 0  ? gabapentin (NEURONTIN) 300 MG capsule Take 300-600 mg by mouth See admin instructions. Taking 2 capsules (600 mg) in the am and 300 mg at bedtime    ? lamoTRIgine (LAMICTAL) 150 MG tablet Take 150 mg by mouth daily after breakfast.     ? metFORMIN (GLUCOPHAGE) 500 MG tablet Take 500 mg by mouth 2 (two) times daily with a meal.     ? oxymetazoline (AFRIN) 0.05 % nasal spray Place 1 spray into both nostrils 2 (two) times daily as needed for congestion.    ? rivaroxaban (XARELTO) 20 MG TABS tablet Take 1 tablet (20 mg total) by mouth daily with supper. 30 tablet 0  ? rOPINIRole (REQUIP) 4 MG tablet Take 8-12 mg by mouth at bedtime.    ? traMADol (ULTRAM) 50 MG tablet Take 50 mg by mouth as needed.    ? TRINTELLIX 20 MG TABS tablet Take 20 mg by mouth daily.    ? Vitamin D, Ergocalciferol, (DRISDOL) 1.25 MG (50000  UNIT) CAPS capsule Take 50,000 Units by mouth once a week.    ? ondansetron (ZOFRAN-ODT) 4 MG disintegrating tablet 4mg  ODT q4 hours prn nausea/vomit 30 tablet 0  ? ?No facility-administered medications prior to visit.  ? ? ? ?Review of Systems:  ? ?Constitutional:   No  weight loss, night sweats,  Fevers, chills,  ?+fatigue, or  lassitude. ? ?HEENT:   No headaches,  Difficulty swallowing,  Tooth/dental problems, or  Sore throat,  ?              No sneezing, itching, ear ache, nasal congestion, post nasal drip,  ? ?CV:  No chest pain,  Orthopnea, PND, swelling in lower extremities, anasarca, dizziness, palpitations, syncope.  ? ?GI  No heartburn, indigestion, abdominal pain, nausea, vomiting, diarrhea, change in bowel habits, loss of appetite, bloody stools.  ? ?Resp:   No excess mucus, no productive cough,  No non-productive cough,  No coughing up of blood.  No change in color of mucus.  No wheezing.  No chest wall deformity ? ?Skin: no rash or lesions. ? ?GU: no dysuria, change in color of urine, no urgency or frequency.  No flank pain, no hematuria  ? ?MS: +knee pain .  ? ? ? ?Physical  Exam ? ?BP 118/60 (BP Location: Left Arm, Patient Position: Sitting, Cuff Size: Large)   Pulse 99   Temp 98.2 ?F (36.8 ?C) (Oral)   Ht 5\' 5"  (1.651 m)   Wt 280 lb 12.8 oz (127.4 kg)   LMP 01/29/2013   SpO2 95%   BMI 4

## 2021-09-12 NOTE — Assessment & Plan Note (Signed)
Recurrent PE on lifelong anticoagulation.  Patient education given. ?Avoid nonsteroidals. ?Patient endorses compliance.  Patient is high risk to have a break in therapy.  Each time that she has had a break in therapy she has had a recurrent clot. ? ?Plan  ?Patient Instructions  ?Continue on CPAP At bedtime   ?Continue on Xarelto 20mg  daily.  ?No NSAIDS.  ?Report any bleeding .  ?Activity as tolerated.  ?Activity as tolerated.  ?Good luck with upcoming knee surgery.  ?Follow up with Dr. Silas Flood in 3 months and As needed   ?Please contact office for sooner follow up if symptoms do not improve or worsen or seek emergency care  ? ?  ? ' ? ?

## 2021-09-12 NOTE — Assessment & Plan Note (Signed)
Preop pulmonary risk assessment.  Patient has underlying recurrent PE and is high risk for recurrence with a break in therapy.  She has had 4 PEs while off of therapy. . Patient will need to have a Lovenox bridge.  ?Postprocedure activity mobility will be key.  ?Patient also has underlying sleep apnea that has excellent control on CPAP.  We will need to continue CPAP at bedtime.  If needed in the postop setting -use as indicated ?Patient appears to be stable and under good control.  We went over the potential risk from a pulmonary standpoint ? ? ?Major Pulmonary risks identified in the multifactorial risk analysis are but not limited to a) pneumonia; b) recurrent intubation risk; c) prolonged or recurrent acute respiratory failure needing mechanical ventilation; d) prolonged hospitalization; e) DVT/Pulmonary embolism; f) Acute Pulmonary edema ? ?Recommend ?1. Short duration of surgery as much as possible and avoid paralytic if possible  ?2. Recovery in step down or ICU with Pulmonary consultation if indicated ?3. Lovenox bridge for Xarelto  ?4. Aggressive pulmonary toilet with o2, bronchodilatation, and incentive spirometry and early ambulation ? ? ? ?

## 2021-09-12 NOTE — Assessment & Plan Note (Signed)
Possible pneumonitis versus air trapping on CT scan.  Patient received a course of steroids with clinical improvement.  Patient seems to be back to baseline with no exertional hypoxemia. ? ?

## 2021-09-12 NOTE — Patient Instructions (Addendum)
Continue on CPAP At bedtime   ?Continue on Xarelto 20mg  daily.  ?No NSAIDS.  ?Report any bleeding .  ?Activity as tolerated.  ?Activity as tolerated.  ?Good luck with upcoming knee surgery.  ?Follow up with Dr. Silas Flood in 3 months and As needed   ?Please contact office for sooner follow up if symptoms do not improve or worsen or seek emergency care  ? ?  ?

## 2021-09-13 ENCOUNTER — Other Ambulatory Visit: Payer: Self-pay | Admitting: Obstetrics and Gynecology

## 2021-09-13 DIAGNOSIS — N6310 Unspecified lump in the right breast, unspecified quadrant: Secondary | ICD-10-CM

## 2021-09-16 ENCOUNTER — Other Ambulatory Visit: Payer: Self-pay | Admitting: Obstetrics and Gynecology

## 2021-09-16 ENCOUNTER — Other Ambulatory Visit: Payer: Self-pay | Admitting: Orthopaedic Surgery

## 2021-09-16 DIAGNOSIS — N6310 Unspecified lump in the right breast, unspecified quadrant: Secondary | ICD-10-CM

## 2021-09-18 NOTE — Telephone Encounter (Signed)
Please advise on surgical clearance ?

## 2021-09-19 NOTE — Telephone Encounter (Signed)
I just saw her in office for this , it is in my note. What else are they requesting  ?

## 2021-09-19 NOTE — Telephone Encounter (Signed)
Called patient but she did not answer. Left message for her to call back.  

## 2021-09-19 NOTE — Telephone Encounter (Signed)
Pt OV notes and clearance form have been faxed back to [facility] Nothing further needed at this time. ?

## 2021-09-20 ENCOUNTER — Telehealth: Payer: Self-pay | Admitting: Pulmonary Disease

## 2021-09-20 NOTE — Telephone Encounter (Signed)
ATC Rebecca at [facility], [facility] ?

## 2021-09-23 NOTE — Telephone Encounter (Signed)
Spoke with Wells Guiles with Ortho  ?She said that pt's surgery date has been moved to Jun 27th  ?Pt told them that we planned to bridge her coumadin  ?Tammy, can you please advise plan to bridge coumadin? Thanks! ?

## 2021-09-24 ENCOUNTER — Ambulatory Visit: Payer: Commercial Managed Care - PPO | Admitting: Cardiovascular Disease

## 2021-09-24 NOTE — Telephone Encounter (Signed)
My understanding she is on Xarelto  ? ?Knee replacement will be done in hospital and she will be on Lovenox bridge per pharmacy protocol .  ? ? ?I did not have down she was on coumadin  ?

## 2021-09-24 NOTE — Telephone Encounter (Signed)
No still need Lovenox bridge per pharmacy protocol  ?

## 2021-09-24 NOTE — Telephone Encounter (Signed)
Erika Woods, I apologize, it is not coumadin that she takes. She is on Xarelto and this was a typo. Does this change your recommendation? ?

## 2021-09-25 ENCOUNTER — Ambulatory Visit
Admission: RE | Admit: 2021-09-25 | Discharge: 2021-09-25 | Disposition: A | Discharge status: Home / Self Care | Payer: Commercial Managed Care - PPO | Source: Ambulatory Visit | Attending: Obstetrics and Gynecology | Admitting: Obstetrics and Gynecology

## 2021-09-25 ENCOUNTER — Ambulatory Visit: Payer: Commercial Managed Care - PPO

## 2021-09-25 DIAGNOSIS — N6310 Unspecified lump in the right breast, unspecified quadrant: Secondary | ICD-10-CM

## 2021-09-25 NOTE — Telephone Encounter (Signed)
Called [facility] [phone] to speak to [facility] (surgery coordinator).   I left a detailed message for her. ?

## 2021-10-04 ENCOUNTER — Telehealth: Payer: Self-pay | Admitting: Hematology and Oncology

## 2021-10-04 NOTE — Telephone Encounter (Signed)
Per provider request called pt and left message about appointment reschedule.  Details and called back number left

## 2021-10-05 ENCOUNTER — Encounter (HOSPITAL_BASED_OUTPATIENT_CLINIC_OR_DEPARTMENT_OTHER): Payer: Self-pay | Admitting: Emergency Medicine

## 2021-10-05 ENCOUNTER — Emergency Department (HOSPITAL_BASED_OUTPATIENT_CLINIC_OR_DEPARTMENT_OTHER)
Admission: EM | Admit: 2021-10-05 | Discharge: 2021-10-05 | Disposition: A | Discharge status: Home / Self Care | Payer: Commercial Managed Care - PPO | Attending: Emergency Medicine | Admitting: Emergency Medicine

## 2021-10-05 ENCOUNTER — Emergency Department (HOSPITAL_BASED_OUTPATIENT_CLINIC_OR_DEPARTMENT_OTHER): Payer: Commercial Managed Care - PPO

## 2021-10-05 ENCOUNTER — Other Ambulatory Visit: Payer: Self-pay

## 2021-10-05 DIAGNOSIS — Z7901 Long term (current) use of anticoagulants: Secondary | ICD-10-CM | POA: Diagnosis not present

## 2021-10-05 DIAGNOSIS — Z20822 Contact with and (suspected) exposure to covid-19: Secondary | ICD-10-CM | POA: Insufficient documentation

## 2021-10-05 DIAGNOSIS — R0602 Shortness of breath: Secondary | ICD-10-CM | POA: Diagnosis present

## 2021-10-05 DIAGNOSIS — I272 Pulmonary hypertension, unspecified: Secondary | ICD-10-CM

## 2021-10-05 DIAGNOSIS — J069 Acute upper respiratory infection, unspecified: Secondary | ICD-10-CM | POA: Diagnosis not present

## 2021-10-05 LAB — URINALYSIS, ROUTINE W REFLEX MICROSCOPIC
Bilirubin Urine: NEGATIVE
Glucose, UA: NEGATIVE mg/dL
Hgb urine dipstick: NEGATIVE
Ketones, ur: 15 mg/dL — AB
Nitrite: NEGATIVE
Protein, ur: NEGATIVE mg/dL
Specific Gravity, Urine: 1.025 (ref 1.005–1.030)
pH: 5.5 (ref 5.0–8.0)

## 2021-10-05 LAB — CBC
HCT: 44.9 % (ref 36.0–46.0)
Hemoglobin: 14.3 g/dL (ref 12.0–15.0)
MCH: 28.1 pg (ref 26.0–34.0)
MCHC: 31.8 g/dL (ref 30.0–36.0)
MCV: 88.4 fL (ref 80.0–100.0)
Platelets: 257 10*3/uL (ref 150–400)
RBC: 5.08 MIL/uL (ref 3.87–5.11)
RDW: 14.5 % (ref 11.5–15.5)
WBC: 7.4 10*3/uL (ref 4.0–10.5)
nRBC: 0 % (ref 0.0–0.2)

## 2021-10-05 LAB — URINALYSIS, MICROSCOPIC (REFLEX)

## 2021-10-05 LAB — COMPREHENSIVE METABOLIC PANEL
ALT: 17 U/L (ref 0–44)
AST: 18 U/L (ref 15–41)
Albumin: 3.9 g/dL (ref 3.5–5.0)
Alkaline Phosphatase: 81 U/L (ref 38–126)
Anion gap: 8 (ref 5–15)
BUN: 16 mg/dL (ref 6–20)
CO2: 25 mmol/L (ref 22–32)
Calcium: 8.8 mg/dL — ABNORMAL LOW (ref 8.9–10.3)
Chloride: 106 mmol/L (ref 98–111)
Creatinine, Ser: 0.86 mg/dL (ref 0.44–1.00)
GFR, Estimated: >60 mL/min (ref >60)
Glucose, Bld: 118 mg/dL — ABNORMAL HIGH (ref 70–99)
Potassium: 4.3 mmol/L (ref 3.5–5.1)
Sodium: 139 mmol/L (ref 135–145)
Total Bilirubin: 1 mg/dL (ref 0.3–1.2)
Total Protein: 7.4 g/dL (ref 6.5–8.1)

## 2021-10-05 LAB — RAPID INFLUENZA A&B ANTIGENS
Influenza A (ARMC): NEGATIVE
Influenza B (ARMC): NEGATIVE

## 2021-10-05 LAB — LIPASE, BLOOD: Lipase: 36 U/L (ref 11–51)

## 2021-10-05 LAB — SARS CORONAVIRUS 2 BY RT PCR: SARS Coronavirus 2 by RT PCR: NEGATIVE

## 2021-10-05 LAB — BRAIN NATRIURETIC PEPTIDE: B Natriuretic Peptide: 103.1 pg/mL — ABNORMAL HIGH (ref 0.0–100.0)

## 2021-10-05 MED ORDER — ACETAMINOPHEN 500 MG PO TABS
1000.0000 mg | ORAL_TABLET | Freq: Once | ORAL | Status: AC
  Administered 2021-10-05: 1000 mg via ORAL
  Filled 2021-10-05: qty 2

## 2021-10-05 MED ORDER — ONDANSETRON HCL 4 MG/2ML IJ SOLN
4.0000 mg | Freq: Once | INTRAMUSCULAR | Status: AC | PRN
  Administered 2021-10-05: 4 mg via INTRAVENOUS
  Filled 2021-10-05: qty 2

## 2021-10-05 MED ORDER — IOHEXOL 350 MG/ML SOLN
75.0000 mL | Freq: Once | INTRAVENOUS | Status: AC | PRN
  Administered 2021-10-05: 75 mL via INTRAVENOUS

## 2021-10-05 NOTE — ED Triage Notes (Signed)
Pt arrives pov, slow gait, with c/o shob, HA, body aches, n/v starting last night. Tramadol at 1700 today

## 2021-10-05 NOTE — Discharge Instructions (Signed)
Your symptoms are consistent with likely viral upper respiratory infection.  Your CT scan did not reveal evidence of new blood clots or bacterial pneumonia.  Your full CT scan results are as follows: IMPRESSION: 1. No evidence of acute pulmonary embolus. 2. Sequela of chronic pulmonary thromboembolic disease, with a thin synechiae in the right lower lobe pulmonary artery as well as dilation of the main pulmonary artery consistent with pulmonary arterial hypertension. These are stable findings. 3. Stable 4 cm ascending thoracic aortic aneurysm. Recommend annual imaging followup by CTA or MRA. This recommendation follows 2010 ACCF/AHA/AATS/ACR/ASA/SCA/SCAI/SIR/STS/SVM Guidelines for the Diagnosis and Management of Patients with Thoracic Aortic Disease. Circulation. 2010; 121: Q300-P233. Aortic aneurysm NOS (ICD10-I71.9) 4. Stable small hiatal hernia. 5. Mosaic ground-glass attenuation within the lungs, consistent with areas of air trapping or hypoventilatory change. No acute airspace disease. 6. Hepatic steatosis.  Given these findings, recommend routine follow-up with pulmonology in clinic.  Return to the emergency department in the event of worsening dyspnea, persistent desaturations at home.  This could be indicative of a pulmonary hypertensive crisis and warrant oxygen administration and admission to the hospital.

## 2021-10-05 NOTE — ED Notes (Signed)
Ambulated with pulse Ox: SpO2: 99-100 Heart Rate: 110-105

## 2021-10-05 NOTE — ED Provider Notes (Signed)
[facility] EMERGENCY DEPARTMENT Provider Note   CSN: [id number] Arrival date & time: 10/05/21  1707     History {Add pertinent medical, surgical, social history, OB history to HPI:1} Chief Complaint  Patient presents with   Shortness of Breath    Erika Woods is a 56 y.o. female.   Shortness of Breath   56 year old female presenting with one day of headache, bodyaches, low grade fever, shortness of breath, and desaturations at home. She has a history of four PEs. She takes her Xarelto and hasnt missed any doses. She endorses nausea, vomiting, and diarrhea.    Home Medications Prior to Admission medications   Medication Sig Start Date End Date Taking? Authorizing Provider  albuterol (VENTOLIN HFA) 108 (90 Base) MCG/ACT inhaler Inhale 2 puffs into the lungs every 6 (six) hours as needed for wheezing or shortness of breath.    [provider]  cloNIDine (CATAPRES) 0.3 MG tablet clonidine HCl 0.3 mg tablet  TAKE 1 TABLET BY MOUTH EVERYDAY AT BEDTIME    [provider]  dextromethorphan (DELSYM) 30 MG/5ML liquid Take 15 mg by mouth as needed for cough.    [provider]  Fluticasone-Umeclidin-Vilant (TRELEGY ELLIPTA) 200-62.5-25 MCG/ACT AEPB Inhale 1 puff into the lungs daily. 07/19/21   Hunsucker, Bonna Gains, MD  gabapentin (NEURONTIN) 300 MG capsule Take 300-600 mg by mouth See admin instructions. Taking 2 capsules (600 mg) in the am and 300 mg at bedtime 04/01/20   [provider]  lamoTRIgine (LAMICTAL) 150 MG tablet Take 150 mg by mouth daily after breakfast.  06/30/18   [provider]  metFORMIN (GLUCOPHAGE) 500 MG tablet Take 500 mg by mouth 2 (two) times daily with a meal.  10/28/13   [provider]  ondansetron (ZOFRAN-ODT) 4 MG disintegrating tablet 4mg  ODT q4 hours prn nausea/vomit 07/12/21   Mesner, Corene Cornea, MD  oxymetazoline (AFRIN) 0.05 % nasal spray Place 1 spray into both nostrils 2 (two) times daily as needed  for congestion.    [provider]  rivaroxaban (XARELTO) 20 MG TABS tablet Take 1 tablet (20 mg total) by mouth daily with supper. 03/13/21   Thurnell Lose, MD  rOPINIRole (REQUIP) 4 MG tablet Take 8-12 mg by mouth at bedtime. 11/03/20   [provider]  traMADol (ULTRAM) 50 MG tablet Take 50 mg by mouth as needed.    [provider]  TRINTELLIX 20 MG TABS tablet Take 20 mg by mouth daily. 05/13/19   [provider]  Vitamin D, Ergocalciferol, (DRISDOL) 1.25 MG (50000 UNIT) CAPS capsule Take 50,000 Units by mouth once a week. 05/25/21   [provider]      Allergies    Sulfa antibiotics    Review of Systems   Review of Systems  Respiratory:  Positive for shortness of breath.    Physical Exam Updated Vital Signs BP 127/74 (BP Location: Right Arm)   Pulse 87   Temp 99.6 F (37.6 C) (Oral)   Resp (!) 32   Ht 5\' 5"  (1.651 m)   Wt 122.5 kg   LMP 01/29/2013   SpO2 100% Comment: at rest  BMI 44.93 kg/m  Physical Exam  ED Results / Procedures / Treatments   Labs (all labs ordered are listed, but only abnormal results are displayed) Labs Reviewed  SARS CORONAVIRUS 2 BY RT PCR  LIPASE, BLOOD  COMPREHENSIVE METABOLIC PANEL  CBC  URINALYSIS, ROUTINE W REFLEX MICROSCOPIC  BRAIN NATRIURETIC PEPTIDE  EKG None  Radiology No results found.  Procedures Procedures  {Document cardiac monitor, telemetry assessment procedure when appropriate:1}  Medications Ordered in ED Medications  ondansetron (ZOFRAN) injection 4 mg (has no administration in time range)    ED Course/ Medical Decision Making/ A&P                           Medical Decision Making Amount and/or Complexity of Data Reviewed Labs: ordered. Radiology: ordered.  Risk Prescription drug management.   ***  {Document critical care time when appropriate:1} {Document review of labs and clinical decision tools ie heart score, Chads2Vasc2 etc:1}  {Document your  independent review of radiology images, and any outside records:1} {Document your discussion with family members, caretakers, and with consultants:1} {Document social determinants of health affecting pt's care:1} {Document your decision making why or why not admission, treatments were needed:1} Final Clinical Impression(s) / ED Diagnoses Final diagnoses:  None    Rx / DC Orders ED Discharge Orders     None

## 2021-10-05 NOTE — ED Notes (Signed)
Pt gone to CT 

## 2021-10-05 NOTE — ED Notes (Signed)
Patient states that she has a history of PE's. States that she get shortness of breath with activity and at rest. States that she has a Urinary infection .States that she is currently on medication

## 2021-10-07 LAB — URINE CULTURE: Culture: >=100000 — AB

## 2021-10-08 ENCOUNTER — Telehealth: Payer: Self-pay | Admitting: *Deleted

## 2021-10-08 NOTE — Telephone Encounter (Signed)
Post ED Visit - Positive Culture Follow-up  Culture report reviewed by antimicrobial stewardship pharmacist: [facility] []  , Pharm.D. []  Enzo Bi, Pharm.D., BCPS AQ-ID []  , Pharm.D., BCPS []  Celedonio Miyamoto, Pharm.D., BCPS []  [place], Garvin Fila.D., BCPS, AAHIVP []  , Pharm.D., BCPS, AAHIVP []  Georgina Pillion, PharmD, BCPS []  , PharmD, BCPS []  [place], PharmD, BCPS []  [address], PharmD []  , PharmD, BCPS []  Estella Husk, PharmD  Pharmacy Team []  Lysle Pearl, PharmD []  , PharmD []  Phillips Climes, PharmD []  , Rph []  Agapito Games) , PharmD []  Verlan Friends, PharmD []  , PharmD []  Mervyn Gay, PharmD []  , PharmD []  Vinnie Level, PharmD []  [facility], PharmD []  , PharmD []  Len Childs, PharmD   Positive urine culture Treated with Macrobid PTA, and no further patient follow-up is required at this time.  , PharmD  Greer Pickerel Talley 10/08/2021, 8:14 AM

## 2021-10-08 NOTE — Progress Notes (Signed)
ED Antimicrobial Stewardship Positive Culture Follow Up   Erika Woods is an 56 y.o. female who presented to [facility] on 10/05/2021 with a chief complaint of  Chief Complaint  Patient presents with   Shortness of Breath    Recent Results (from the past 720 hour(s))  SARS Coronavirus 2 by RT PCR (hospital order, performed in [facility] hospital lab) *cepheid single result test* Anterior Nasal Swab     Status: None   Collection Time: 10/05/21  5:30 PM   Specimen: Anterior Nasal Swab  Result Value Ref Range Status   SARS Coronavirus 2 by RT PCR NEGATIVE NEGATIVE Final    Comment: (NOTE) SARS-CoV-2 target nucleic acids are NOT DETECTED.  The SARS-CoV-2 RNA is generally detectable in upper and lower respiratory specimens during the acute phase of infection. The lowest concentration of SARS-CoV-2 viral copies this assay can detect is 250 copies / mL. A negative result does not preclude SARS-CoV-2 infection and should not be used as the sole basis for treatment or other patient management decisions.  A negative result may occur with improper specimen collection / handling, submission of specimen other than nasopharyngeal swab, presence of viral mutation(s) within the areas targeted by this assay, and inadequate number of viral copies (<250 copies / mL). A negative result must be combined with clinical observations, patient history, and epidemiological information.  Fact Sheet for Patients:   https://www.patel.info/  Fact Sheet for Healthcare Providers: https://hall.com/  This test is not yet approved or  cleared by the [place] FDA and has been authorized for detection and/or diagnosis of SARS-CoV-2 by FDA under an Emergency Use Authorization (EUA).  This EUA will remain in effect (meaning this test can be used) for the duration of the COVID-19 declaration under Section 564(b)(1) of the Act, 21 U.S.C. section 360bbb-3(b)(1), unless  the authorization is terminated or revoked sooner.  Performed at [facility], [address], [place], [place] [address]   Urine Culture     Status: Abnormal   Collection Time: 10/05/21  5:42 PM   Specimen: Urine, Random  Result Value Ref Range Status   Specimen Description   Final    URINE, RANDOM Performed at [facility], [address] [place], [place] [address]    Special Requests   Final    NONE Performed at [facility], [address] [place], [place] [address]    Culture (A)  Final    >=100,000 COLONIES/mL GROUP B STREP(S.AGALACTIAE)ISOLATED TESTING AGAINST S. AGALACTIAE NOT ROUTINELY PERFORMED DUE TO PREDICTABILITY OF AMP/PEN/VAN SUSCEPTIBILITY. Performed at [facility], [place] [address], [place], [place] [address]    Report Status 10/07/2021 FINAL  Final  Rapid Influenza A&B Antigens     Status: None   Collection Time: 10/05/21  8:00 PM   Specimen: Flu Kit Nasopharyngeal Swab; Respiratory  Result Value Ref Range Status   Influenza A ([facility]) NEGATIVE NEGATIVE Final   Influenza B ([facility]) NEGATIVE NEGATIVE Final    Comment: Negative results do not exclude influenza virus infection, and influenza should still be considered if clinical suspicion is high. Performed at [facility], [name] [place], [place] [address]     Prior to this ED visit, patient started on [facility] outpatient for UTI treatment. No additional antibiotics are indicated.   ED Provider: Kathe Becton, PA-C   Pauletta Browns 10/08/2021, 9:22 AM Clinical Pharmacist

## 2021-10-14 ENCOUNTER — Telehealth: Payer: Self-pay | Admitting: Adult Health

## 2021-10-14 DIAGNOSIS — I2699 Other pulmonary embolism without acute cor pulmonale: Secondary | ICD-10-CM

## 2021-10-15 NOTE — Telephone Encounter (Signed)
ATC [facility]. Will call again tomorrow to tell them they need to do   Levonox bridge per pharmacy protocol.   No voicemail option for [facility] after hours. Will call again tomorrow.

## 2021-10-22 NOTE — Telephone Encounter (Signed)
Called and spoke with pt letting her know the info about her doing the lovenox per pharmacy protocol. Let her know that we have tried to call [facility] about this but have not been able to reach them.    Pt wanted to know when she would need to stop the xarelto and when she would begin the lovenox (if it would be started prior to her going into the hospital for the surgery or when it would begin).  Dr. Judeth Horn, please advise on this for pt.

## 2021-10-23 ENCOUNTER — Ambulatory Visit: Payer: Commercial Managed Care - PPO | Admitting: Pulmonary Disease

## 2021-10-23 NOTE — Telephone Encounter (Signed)
That is at the discretion of her orthopedic surgeon. I usually advise a minimum 48 hours off xarelto and using lovenox while the xarelto is being held. The surgeon may want a longer period of time off. This really is in the purview of the surgeon in terms of how long to stop xarelto pre and post op.

## 2021-10-23 NOTE — Telephone Encounter (Signed)
Called patient but she did not answer. Left message for her to call us back.  

## 2021-10-24 ENCOUNTER — Inpatient Hospital Stay (HOSPITAL_COMMUNITY): Admission: RE | Admit: 2021-10-24 | Payer: Commercial Managed Care - PPO | Source: Ambulatory Visit

## 2021-10-25 NOTE — Telephone Encounter (Signed)
Advised patient last dose oral blood thinner evening 6/24. Start lovenox 1 mg/kg BID 6/25 with last dose 6/26 PM with surgery scheduled 6/27. Do we teach how to do lovenox injections in clinic?

## 2021-10-25 NOTE — Telephone Encounter (Signed)
Dr. Judeth Horn please advise , pt states she would like to speak to you specifically. pt states her orthopedic has told her (its up to the  anesthesiologist to continue meds or not, however she wants to specifically speak to you about her concerns )    Patient's number is [phone]

## 2021-10-28 NOTE — Telephone Encounter (Signed)
Spoke with patient and she states she hasn't given herself enoxaparin injections before. Has received in hospital from nurses. Reviewed admin technique-  she states that her husband will likely be giving her the injection. I reviewed that generally pharmacist at local pharmacy can provide education since it's pre-filled subcutaneous injection. If they have further questions or don't feel comfortable administering after this counseling, they should complete dose with pharmacist observing or guiding (she will be using doses on the weekend so I am unable to meet with her unless she picks medication up earlier).  She inquired if Dr. Jerl Santos has ok'd this recommendation and if so and in agreement, can move forward with sending rx and picking it up from pharmacy.  Chesley Mires, PharmD, MPH, BCPS, CPP Clinical Pharmacist (Rheumatology and Pulmonology)

## 2021-10-28 NOTE — Progress Notes (Signed)
Anesthesia Review:  PCP: Cardiologist : Chest x-ray : 06/05/21  EKG : 10/10/21  Pft- 07/19/21  Ct cORS- 2021  Echo : 04/30/21  10/05/21- ct ANGIO CHEST  Stress test: Cardiac Cath :  Activity level:  Sleep Study/ CPAP : Fasting Blood Sugar :      / Checks Blood Sugar -- times a day:   Blood Thinner/ Instructions /Last Dose: ASA / Instructions/ Last Dose :   LAST DOSE OF BLOOD THINNER ON 11/02/21.   XARELTO ` Dm- TYPE  HGBA1C-  10/05/21- IN ED WTO[facility] URI

## 2021-10-28 NOTE — Telephone Encounter (Signed)
Called [facility].  Was transferred to surgery coordinator and had to leave message.  Since there has been little to no direction from surgeon's office, I think we should move forward with my plan as above.  Last dose of Xarelto evening 6/24.  Start Lovenox 1 mg/kg twice daily, start 6/25 with last dose 6/26 evening, surgery scheduled 6/27.  Resumption of anticoagulation at the discretion of the surgeon, would recommend resuming as soon as felt safe from surgical standpoint.

## 2021-10-28 NOTE — Progress Notes (Signed)
DUE TO COVID-19 ONLY  2  VISITOR IS ALLOWED TO COME WITH YOU AND STAY IN THE WAITING ROOM ONLY DURING PRE OP AND PROCEDURE DAY OF SURGERY.   4 VISITOR  MAY VISIT WITH YOU AFTER SURGERY IN YOUR PRIVATE ROOM DURING VISITING HOURS ONLY! YOU MAY HAVE ONE PERSON SPEND THE NITE WITH YOU IN YOUR ROOM AFTER SURGERY.     Your procedure is scheduled on:         11/05/2021   Report to [facility] Main  Entrance   Report to admitting at    0515            AM DO NOT BRING INSURAN  CE CARD, PICTURE ID OR WALLET DAY OF SURGERY.      Call this number if you have problems the morning of surgery [phone]    REMEMBER: NO  SOLID FOODS , CANDY, GUM OR MINTS AFTER MIDNITE THE NITE BEFORE SURGERY .       Marland Kitchen CLEAR LIQUIDS UNTIL       0430am          DAY OF SURGERY.      PLEASE FINISH  g 2 lower usgar DRINK PER SURGEON ORDER  WHICH NEEDS TO BE COMPLETED AT     0430am     MORNING OF SURGERY.       CLEAR LIQUID DIET   Foods Allowed      WATER BLACK COFFEE ( SUGAR OK, NO MILK, CREAM OR CREAMER) REGULAR AND DECAF  TEA ( SUGAR OK NO MILK, CREAM, OR CREAMER) REGULAR AND DECAF  PLAIN JELLO ( NO RED)  FRUIT ICES ( NO RED, NO FRUIT PULP)  POPSICLES ( NO RED)  JUICE- APPLE, WHITE GRAPE AND WHITE CRANBERRY  SPORT DRINK LIKE GATORADE ( NO RED)  CLEAR BROTH ( VEGETABLE , CHICKEN OR BEEF)                                                                     BRUSH YOUR TEETH MORNING OF SURGERY AND RINSE YOUR MOUTH OUT, NO CHEWING GUM CANDY OR MINTS.     Take these medicines the morning of surgery with A SIP OF WATER:  inhalers as usual and bring, gabapentin, lamictal    DO NOT TAKE ANY DIABETIC MEDICATIONS DAY OF YOUR SURGERY                               You may not have any metal on your body including hair pins and              piercings  Do not wear jewelry, make-up, lotions, powders or perfumes, deodorant             Do not wear nail polish on your fingernails.              IF YOU ARE A FEMALE AND  WANT TO SHAVE UNDER ARMS OR LEGS PRIOR TO SURGERY YOU MUST DO SO AT LEAST 48 HOURS PRIOR TO SURGERY.              Men may shave face and neck.   Do not bring valuables to the hospital. [facility] IS  NOT             RESPONSIBLE   FOR VALUABLES.  Contacts, dentures or bridgework may not be worn into surgery.  Leave suitcase in the car. After surgery it may be brought to your room.     Patients discharged the day of surgery will not be allowed to drive home. IF YOU ARE HAVING SURGERY AND GOING HOME THE SAME DAY, YOU MUST HAVE AN ADULT TO DRIVE YOU HOME AND BE WITH YOU FOR 24 HOURS. YOU MAY GO HOME BY TAXI OR UBER OR ORTHERWISE, BUT AN ADULT MUST ACCOMPANY YOU HOME AND STAY WITH YOU FOR 24 HOURS.                Please read over the following fact sheets you were given: _____________________________________________________________________  [facility] - Preparing for Surgery Before surgery, you can play an important role.  Because skin is not sterile, your skin needs to be as free of germs as possible.  You can reduce the number of germs on your skin by washing with CHG (chlorahexidine gluconate) soap before surgery.  CHG is an antiseptic cleaner which kills germs and bonds with the skin to continue killing germs even after washing. Please DO NOT use if you have an allergy to CHG or antibacterial soaps.  If your skin becomes reddened/irritated stop using the CHG and inform your nurse when you arrive at Short Stay. Do not shave (including legs and underarms) for at least 48 hours prior to the first CHG shower.  You may shave your face/neck. Please follow these instructions carefully:  1.  Shower with CHG Soap the night before surgery and the  morning of Surgery.  2.  If you choose to wash your hair, wash your hair first as usual with your  normal  shampoo.  3.  After you shampoo, rinse your hair and body thoroughly to remove the  shampoo.                           4.  Use CHG as you would any other  liquid soap.  You can apply chg directly  to the skin and wash                       Gently with a scrungie or clean washcloth.  5.  Apply the CHG Soap to your body ONLY FROM THE NECK DOWN.   Do not use on face/ open                           Wound or open sores. Avoid contact with eyes, ears mouth and genitals (private parts).                       Wash face,  Genitals (private parts) with your normal soap.             6.  Wash thoroughly, paying special attention to the area where your surgery  will be performed.  7.  Thoroughly rinse your body with warm water from the neck down.  8.  DO NOT shower/wash with your normal soap after using and rinsing off  the CHG Soap.                9.  Pat yourself dry with a clean towel.  10.  Wear clean pajamas.            11.  Place clean sheets on your bed the night of your first shower and do not  sleep with pets. Day of Surgery : Do not apply any lotions/deodorants the morning of surgery.  Please wear clean clothes to the hospital/surgery center.  FAILURE TO FOLLOW THESE INSTRUCTIONS MAY RESULT IN THE CANCELLATION OF YOUR SURGERY PATIENT SIGNATURE_________________________________  NURSE SIGNATURE__________________________________  ________________________________________________________________________

## 2021-10-29 ENCOUNTER — Other Ambulatory Visit (HOSPITAL_COMMUNITY): Payer: Self-pay

## 2021-10-29 MED ORDER — ENOXAPARIN SODIUM 120 MG/0.8ML IJ SOSY: 120.0000 mg | PREFILLED_SYRINGE | Freq: Two times a day (BID) | INTRAMUSCULAR | 0 refills | Status: DC

## 2021-10-29 NOTE — Telephone Encounter (Signed)
Spoke with patient regarding Dr. Laurena Spies recommendation to move forward with his pre-op anticoag plan:   Last dose of Xarelto evening 6/24.  Start Lovenox 1 mg/kg twice daily, start 6/25 with last dose 6/26 evening, surgery scheduled 6/27.  Resumption of anticoagulation at the discretion of the surgeon, would recommend resuming as soon as felt safe from surgical standpoint.   Rx for enoxaparin 120mg  twice daily on 11/03/21 and 11/04/21 sent to [facility]. Patient verbalized understanding ot plan and that she is to discuss with Dr. 11/06/21 regarding post-op anticoagulation plan  Jerl Santos, PharmD, MPH, BCPS, CPP Clinical Pharmacist (Rheumatology and Pulmonology)

## 2021-10-30 ENCOUNTER — Encounter (HOSPITAL_COMMUNITY): Payer: Self-pay

## 2021-10-30 ENCOUNTER — Encounter (HOSPITAL_COMMUNITY)
Admission: RE | Admit: 2021-10-30 | Discharge: 2021-10-30 | Disposition: A | Discharge status: Home / Self Care | Payer: Commercial Managed Care - PPO | Source: Ambulatory Visit | Attending: Orthopaedic Surgery | Admitting: Orthopaedic Surgery

## 2021-10-30 ENCOUNTER — Other Ambulatory Visit: Payer: Self-pay

## 2021-10-30 ENCOUNTER — Ambulatory Visit (HOSPITAL_COMMUNITY)
Admission: RE | Admit: 2021-10-30 | Discharge: 2021-10-30 | Disposition: A | Discharge status: Home / Self Care | Payer: Commercial Managed Care - PPO | Source: Ambulatory Visit | Attending: Orthopaedic Surgery | Admitting: Orthopaedic Surgery

## 2021-10-30 VITALS — BP 119/73 | HR 65 | Temp 98.5°F | Resp 16 | Ht 65.0 in | Wt 270.0 lb

## 2021-10-30 DIAGNOSIS — M1711 Unilateral primary osteoarthritis, right knee: Secondary | ICD-10-CM | POA: Diagnosis not present

## 2021-10-30 DIAGNOSIS — Z01818 Encounter for other preprocedural examination: Secondary | ICD-10-CM

## 2021-10-30 DIAGNOSIS — Z9989 Dependence on other enabling machines and devices: Secondary | ICD-10-CM | POA: Insufficient documentation

## 2021-10-30 DIAGNOSIS — Z01812 Encounter for preprocedural laboratory examination: Secondary | ICD-10-CM | POA: Insufficient documentation

## 2021-10-30 DIAGNOSIS — Z7984 Long term (current) use of oral hypoglycemic drugs: Secondary | ICD-10-CM | POA: Insufficient documentation

## 2021-10-30 DIAGNOSIS — I7121 Aneurysm of the ascending aorta, without rupture: Secondary | ICD-10-CM | POA: Insufficient documentation

## 2021-10-30 DIAGNOSIS — E119 Type 2 diabetes mellitus without complications: Secondary | ICD-10-CM | POA: Insufficient documentation

## 2021-10-30 DIAGNOSIS — Z86711 Personal history of pulmonary embolism: Secondary | ICD-10-CM | POA: Diagnosis not present

## 2021-10-30 DIAGNOSIS — G4733 Obstructive sleep apnea (adult) (pediatric): Secondary | ICD-10-CM | POA: Insufficient documentation

## 2021-10-30 HISTORY — DX: Cardiac murmur, unspecified: R01.1

## 2021-10-30 HISTORY — DX: Urinary tract infection, site not specified: N39.0

## 2021-10-30 HISTORY — DX: Personal history of urinary calculi: Z87.442

## 2021-10-30 HISTORY — DX: Gastro-esophageal reflux disease without esophagitis: K21.9

## 2021-10-30 LAB — BASIC METABOLIC PANEL
Anion gap: 7 (ref 5–15)
BUN: 17 mg/dL (ref 6–20)
CO2: 27 mmol/L (ref 22–32)
Calcium: 9.2 mg/dL (ref 8.9–10.3)
Chloride: 106 mmol/L (ref 98–111)
Creatinine, Ser: 0.9 mg/dL (ref 0.44–1.00)
GFR, Estimated: >60 mL/min (ref >60)
Glucose, Bld: 86 mg/dL (ref 70–99)
Potassium: 4 mmol/L (ref 3.5–5.1)
Sodium: 140 mmol/L (ref 135–145)

## 2021-10-30 LAB — SURGICAL PCR SCREEN
MRSA, PCR: NEGATIVE
Staphylococcus aureus: NEGATIVE

## 2021-10-30 LAB — CBC
HCT: 41 % (ref 36.0–46.0)
Hemoglobin: 12.5 g/dL (ref 12.0–15.0)
MCH: 28.5 pg (ref 26.0–34.0)
MCHC: 30.5 g/dL (ref 30.0–36.0)
MCV: 93.4 fL (ref 80.0–100.0)
Platelets: 210 10*3/uL (ref 150–400)
RBC: 4.39 MIL/uL (ref 3.87–5.11)
RDW: 14.7 % (ref 11.5–15.5)
WBC: 4.9 10*3/uL (ref 4.0–10.5)
nRBC: 0 % (ref 0.0–0.2)

## 2021-10-30 LAB — GLUCOSE, CAPILLARY: Glucose-Capillary: 109 mg/dL — ABNORMAL HIGH (ref 70–99)

## 2021-10-30 LAB — HEMOGLOBIN A1C
Hgb A1c MFr Bld: 5.6 % (ref 4.8–5.6)
Mean Plasma Glucose: 114.02 mg/dL

## 2021-10-30 NOTE — H&P (Cosign Needed)
TOTAL KNEE ADMISSION H&P  Patient is being admitted for right total knee arthroplasty.  Subjective:  Chief Complaint:right knee pain.  HPI: Erika Woods, 56 y.o. female, has a history of pain and functional disability in the right knee due to arthritis and has failed non-surgical conservative treatments for greater than 12 weeks to includeNSAID's and/or analgesics, corticosteriod injections, viscosupplementation injections, flexibility and strengthening excercises, supervised PT with diminished ADL's post treatment, use of assistive devices, weight reduction as appropriate, and activity modification.  Onset of symptoms was gradual, starting 5 years ago with gradually worsening course since that time. The patient noted prior procedures on the knee to include  arthroscopy on the right knee(s).  Patient currently rates pain in the right knee(s) at 10 out of 10 with activity. Patient has night pain, worsening of pain with activity and weight bearing, pain that interferes with activities of daily living, crepitus, and joint swelling.  Patient has evidence of subchondral cysts, subchondral sclerosis, periarticular osteophytes, and joint space narrowing by imaging studies. There is no active infection.  Patient Active Problem List   Diagnosis Date Noted   Preop pulmonary/respiratory exam 09/12/2021   Chronic respiratory failure with hypoxia ([name]) 07/09/2021   Pneumonitis 06/14/2021   Recurrent pulmonary emboli ([name]) 02/18/2021   Hypoxemia 02/18/2021   Pulmonary embolus, right ([name]) 02/18/2021   Lumbar disc herniation 11/08/2020   Radiculopathy 11/05/2020   COVID-19 virus detected 06/07/2019   Acute pulmonary embolism ([name]) 11/11/2018   Severe recurrent major depression without psychotic features ([name]) 02/17/2018   Obesity, Class III, BMI 40-49.9 (morbid obesity) ([name]) 09/16/2017   Acute pulmonary embolism with acute cor pulmonale ([name]) 09/16/2017   Acute superficial venous thrombosis of left  lower extremity    Thoracic aortic aneurysm without rupture ([name])    Pulmonary emboli ([name]) 09/15/2017   PTSD (post-traumatic stress disorder) 08/18/2011   DM (diabetes mellitus) ([name]) 08/18/2011   Sleep apnea 08/18/2011   Mood disorder ([name]) 08/18/2011   Past Medical History:  Diagnosis Date   Acute pulmonary embolism ([name]) 11/11/2018   Acute pulmonary embolism with acute cor pulmonale ([name]) 09/16/2017   Acute superficial venous thrombosis of left lower extremity    Aortic insufficiency    Echocardiogram 08/2019: prob bicuspid AoV, mild to mod AI, mild AS (mean 12 mmHg), EF 55-60, no RWMA, Gr 1 DD, GLS -22.2%, normal RVSF, Ao Root 39 mm   Coronary CTA    Coronary CTA 08/2019: Calcium score 0, no evidence of CAD, ascending aorta 4 cm   Diabetes mellitus without complication ([name])    DM (diabetes mellitus) ([name]) 08/18/2011   GERD (gastroesophageal reflux disease)    Heart murmur    History of kidney stones    Impaired fasting glucose    Mood disorder ([name]) 08/18/2011   Obesity, Class III, BMI 40-49.9 (morbid obesity) ([name]) 09/16/2017   Obstructive sleep apnea on CPAP    Osteoarthritis of knee    Personal history of pulmonary embolism    PTSD (post-traumatic stress disorder) 08/18/2011   Pulmonary emboli ([name]) 09/15/2017   Renal disorder    kideny stones   Restless leg syndrome    Severe recurrent major depression without psychotic features ([name]) 02/17/2018   Sleep apnea 08/18/2011   Thoracic aortic aneurysm ([name])    Thoracic aortic aneurysm without rupture ([name])    Urinary tract infection    hx of chronic uti per pt   UTI (urinary tract infection)    recurrent    Past Surgical History:  Procedure Laterality Date   arm surgery Bilateral Skin removal   DILATATION & CURETTAGE/HYSTEROSCOPY WITH MYOSURE N/A 06/11/2018   Procedure: DILATATION & CURETTAGE/HYSTEROSCOPY WITH MYOSURE RESECTION OF ENDOMETRIAL THICKENING;  Surgeon: [name], MD;  Location: [facility];  Service:  Gynecology;  Laterality: N/A;  BMI 56   DILATION AND EVACUATION     ESOPHAGOGASTRODUODENOSCOPY (EGD) WITH PROPOFOL N/A 07/15/2018   Procedure: ESOPHAGOGASTRODUODENOSCOPY (EGD) WITH PROPOFOL;  Surgeon: [name], MD;  Location: [facility];  Service: Endoscopy;  Laterality: N/A;   ESOPHAGOGASTRODUODENOSCOPY (EGD) WITH PROPOFOL N/A 04/27/2019   Procedure: ESOPHAGOGASTRODUODENOSCOPY (EGD) WITH PROPOFOL;  Surgeon: [name], MD;  Location: [facility];  Service: Endoscopy;  Laterality: N/A;   IR ABLATE LIVER CRYOABLATION  10/28/2019   IR ABLATE LIVER CRYOABLATION  05/07/2020   IR RADIOLOGIST EVAL & MGMT  10/14/2019   IR RADIOLOGIST EVAL & MGMT  04/03/2020   KIDNEY STONE SURGERY     KNEE SURGERY Right    LUMBAR LAMINECTOMY/DECOMPRESSION MICRODISCECTOMY N/A 11/08/2020   Procedure: LUMBAR FIVE AND SACRAL ONE LAMINECTOMY/DECOMPRESSION MICRODISCECTOMY 1 LEVEL;  Surgeon: Melina Schools, MD;  Location: [facility];  Service: Orthopedics;  Laterality: N/A;   TONSILLECTOMY      No current facility-administered medications for this encounter.   Current Outpatient Medications  Medication Sig Dispense Refill Last Dose   acetaminophen (TYLENOL) 500 MG tablet Take 1,000-1,500 mg by mouth every 8 (eight) hours as needed for moderate pain.      albuterol (VENTOLIN HFA) 108 (90 Base) MCG/ACT inhaler Inhale 2 puffs into the lungs every 6 (six) hours as needed for wheezing or shortness of breath.      cloNIDine (CATAPRES) 0.3 MG tablet Take 0.3 mg by mouth at bedtime.      gabapentin (NEURONTIN) 300 MG capsule Take 300-600 mg by mouth See admin instructions. Taking 2 capsules (600 mg) in the am and 300 mg at bedtime      ipratropium (ATROVENT) 0.03 % nasal spray Place 2 sprays into both nostrils at bedtime as needed for allergies.      lamoTRIgine (LAMICTAL) 150 MG tablet Take 150 mg by mouth daily after breakfast.       metFORMIN (GLUCOPHAGE) 500 MG tablet Take 500 mg by mouth 2 (two) times daily with a meal.        nitrofurantoin, macrocrystal-monohydrate, (MACROBID) 100 MG capsule Take 100 mg by mouth daily.      oxymetazoline (AFRIN) 0.05 % nasal spray Place 1 spray into both nostrils 2 (two) times daily as needed for congestion.      rivaroxaban (XARELTO) 20 MG TABS tablet Take 1 tablet (20 mg total) by mouth daily with supper. 30 tablet 0    rOPINIRole (REQUIP) 4 MG tablet Take 12 mg by mouth at bedtime.      traMADol (ULTRAM) 50 MG tablet Take 50 mg by mouth 2 (two) times daily as needed for moderate pain.      triamcinolone (KENALOG) 0.1 % paste Use as directed 1 application  in the mouth or throat 2 (two) times daily as needed for mouth pain.      TRINTELLIX 20 MG TABS tablet Take 20 mg by mouth daily.      Vitamin D, Ergocalciferol, (DRISDOL) 1.25 MG (50000 UNIT) CAPS capsule Take 50,000 Units by mouth once a week.      enoxaparin (LOVENOX) 120 MG/0.8ML injection Inject 0.8 mLs (120 mg total) into the skin every 12 (twelve) hours. On 11/03/21 and 11/04/21 3.2 mL 0    Fluticasone-Umeclidin-Vilant (  TRELEGY ELLIPTA) 200-62.5-25 MCG/ACT AEPB Inhale 1 puff into the lungs daily. (Patient not taking: Reported on 10/23/2021) 28 each 0 Not Taking   ondansetron (ZOFRAN-ODT) 4 MG disintegrating tablet 4mg  ODT q4 hours prn nausea/vomit (Patient not taking: Reported on 10/23/2021) 30 tablet 0 Not Taking   Allergies  Allergen Reactions   Sulfa Antibiotics Hives    Social History   Tobacco Use   Smoking status: Never   Smokeless tobacco: Never  Substance Use Topics   Alcohol use: Never    Family History  Adopted: Yes     Review of Systems  Musculoskeletal:  Positive for arthralgias.       Right knee  All other systems reviewed and are negative.   Objective:  Physical Exam Constitutional:      Appearance: Normal appearance.  HENT:     Head: Normocephalic and atraumatic.     Nose: Nose normal.     Mouth/Throat:     Pharynx: Oropharynx is clear.  Eyes:     Extraocular Movements: Extraocular  movements intact.  Cardiovascular:     Rate and Rhythm: Normal rate and regular rhythm.  Pulmonary:     Effort: Pulmonary effort is normal.  Abdominal:     Palpations: Abdomen is soft.  Musculoskeletal:     Cervical back: Normal range of motion.     Comments: Knee motion is from 0-100 limited by the size of her thigh.  She has medial joint line pain and crepitation.  Hip motion is good and straight leg raise is negative.    Skin:    General: Skin is warm and dry.  Neurological:     General: No focal deficit present.     Mental Status: She is alert and oriented to person, place, and time. Mental status is at baseline.  Psychiatric:        Mood and Affect: Mood normal.        Behavior: Behavior normal.        Thought Content: Thought content normal.        Judgment: Judgment normal.     Vital signs in last 24 hours: Temp:  [98.5 F (36.9 C)] 98.5 F (36.9 C) (06/21 0919) Pulse Rate:  [65] 65 (06/21 0919) Resp:  [16] 16 (06/21 0919) BP: (119)/(73) 119/73 (06/21 0919) SpO2:  [97 %] 97 % (06/21 0919) Weight:  [122.5 kg] 122.5 kg (06/21 0919)  Labs:   Estimated body mass index is 44.93 kg/m as calculated from the following:   Height as of 10/30/21: 5\' 5"  (1.651 m).   Weight as of 10/30/21: 122.5 kg.   Imaging Review Plain radiographs demonstrate severe degenerative joint disease of the right knee(s). The overall alignment isneutral. The bone quality appears to be good for age and reported activity level.      Assessment/Plan:  End stage primary arthritis, right knee   The patient history, physical examination, clinical judgment of the provider and imaging studies are consistent with end stage degenerative joint disease of the right knee(s) and total knee arthroplasty is deemed medically necessary. The treatment options including medical management, injection therapy arthroscopy and arthroplasty were discussed at length. The risks and benefits of total knee arthroplasty  were presented and reviewed. The risks due to aseptic loosening, infection, stiffness, patella tracking problems, thromboembolic complications and other imponderables were discussed. The patient acknowledged the explanation, agreed to proceed with the plan and consent was signed. Patient is being admitted for inpatient treatment for surgery, pain control, PT, OT,  prophylactic antibiotics, VTE prophylaxis, progressive ambulation and ADL's and discharge planning. The patient is planning to be discharged home with home health services  Patient's anticipated LOS is less than 2 midnights, meeting these requirements: - Younger than 1 - Lives within 1 hour of care - Has a competent adult at home to recover with post-op recover - NO history of  - Chronic pain requiring opiods  - Diabetes  - Coronary Artery Disease  - Heart failure  - Heart attack  - Stroke  - DVT/VTE  - Cardiac arrhythmia  - Respiratory Failure/COPD  - Renal failure  - Anemia  - Advanced Liver disease

## 2021-10-31 NOTE — Progress Notes (Signed)
Anesthesia Chart Review   Case: [id number] Date/Time: 11/05/21 0715   Procedure: RIGHT TOTAL KNEE ARTHROPLASTY (Right: Knee)   Anesthesia type: Spinal   Pre-op diagnosis: right knee degenerative joint disease   Location: [facility]OM 06 / [facility]   Surgeons: Marcene Corning, MD       DISCUSSION:56 y.o. never smoker with h/o OSA on cpap, DM II, PE (on Xarelto), thoracic aortic aneurysm (4 cm, stable on CT 10/05/21), right knee djd scheduled for above procedure 11/05/2021 with Dr. Marcene Corning.   Instructions for Lovenox bridge given to pt.  Outlined in 10/29/2021 pharmacy note.   Pt seen by pulmonology 09/12/2021 for preoperative evaluation.  Per OV note, "Preop pulmonary risk assessment.  Patient has underlying recurrent PE and is high risk for recurrence with a break in therapy.  She has had 4 PEs while off of therapy. . Patient will need to have a Lovenox bridge.  Postprocedure activity mobility will be key.  Patient also has underlying sleep apnea that has excellent control on CPAP.  We will need to continue CPAP at bedtime.  If needed in the postop setting -use as indicated Patient appears to be stable and under good control.  We went over the potential risk from a pulmonary standpoint"  Last seen by cardiology 06/11/2021, stable at this visit with 1 year follow up recommended.   Anticipate pt can proceed with planned procedure barring acute status change.   VS: BP 119/73   Pulse 65   Temp 36.9 C (Oral)   Resp 16   Ht 5\' 5"  (1.651 m)   Wt 122.5 kg   LMP 01/29/2013   SpO2 97%   BMI 44.93 kg/m   PROVIDERS: 01/31/2013, MD is PCP    LABS: Labs reviewed: Acceptable for surgery. (all labs ordered are listed, but only abnormal results are displayed)  Labs Reviewed  GLUCOSE, CAPILLARY - Abnormal; Notable for the following components:      Result Value   Glucose-Capillary 109 (*)    All other components within normal limits  SURGICAL PCR SCREEN  HEMOGLOBIN A1C  BASIC METABOLIC  PANEL  CBC  TYPE AND SCREEN     IMAGES: CT Angio Chest 10/05/2021 IMPRESSION: 1. No evidence of acute pulmonary embolus. 2. Sequela of chronic pulmonary thromboembolic disease, with a thin synechiae in the right lower lobe pulmonary artery as well as dilation of the main pulmonary artery consistent with pulmonary arterial hypertension. These are stable findings. 3. Stable 4 cm ascending thoracic aortic aneurysm. Recommend annual imaging followup by CTA or MRA. This recommendation follows 2010 ACCF/AHA/AATS/ACR/ASA/SCA/SCAI/SIR/STS/SVM Guidelines for the Diagnosis and Management of Patients with Thoracic Aortic Disease. Circulation. 2010; 1212011. Aortic aneurysm NOS (ICD10-I71.9) 4. Stable small hiatal hernia. 5. Mosaic ground-glass attenuation within the lungs, consistent with areas of air trapping or hypoventilatory change. No acute airspace disease. 6. Hepatic steatosis.  EKG: 10/10/21 Rate 92 bpm  NSR LAD RBBB Inferior infarct, age undetermined Possible anterolateral infarct, age undetermined  CV: Echo 04/30/2021  1. Left ventricular ejection fraction, by estimation, is 60 to 65%. The  left ventricle has normal function. The left ventricle has no regional  wall motion abnormalities. Left ventricular diastolic parameters were  normal. The average left ventricular  global longitudinal strain is -20.2 %. The global longitudinal strain is  normal.   2. Right ventricular systolic function is normal. The right ventricular  size is normal. There is normal pulmonary artery systolic pressure.   3. The mitral valve is  normal in structure. Trivial mitral valve  regurgitation. No evidence of mitral stenosis.   4. Partial fusion of right and left coronary cusp. The aortic valve is  bicuspid. Aortic valve regurgitation is mild to moderate. No aortic  stenosis is present. Aortic regurgitation PHT measures 657 msec.   5. Aortic dilatation noted. There is mild dilatation of  the ascending  aorta, measuring 42 mm.   6. The inferior vena cava is normal in size with greater than 50%  respiratory variability, suggesting right atrial pressure of 3 mmHg. Past Medical History:  Diagnosis Date   Acute pulmonary embolism ([place]) 11/11/2018   Acute pulmonary embolism with acute cor pulmonale ([place]) 09/16/2017   Acute superficial venous thrombosis of left lower extremity    Aortic insufficiency    Echocardiogram 08/2019: prob bicuspid AoV, mild to mod AI, mild AS (mean 12 mmHg), EF 55-60, no RWMA, Gr 1 DD, GLS -22.2%, normal RVSF, Ao Root 39 mm   Coronary CTA    Coronary CTA 08/2019: Calcium score 0, no evidence of CAD, ascending aorta 4 cm   Diabetes mellitus without complication ([name])    DM (diabetes mellitus) ([place]) 08/18/2011   GERD (gastroesophageal reflux disease)    Heart murmur    History of kidney stones    Impaired fasting glucose    Mood disorder ([place]) 08/18/2011   Obesity, Class III, BMI 40-49.9 (morbid obesity) ([place]) 09/16/2017   Obstructive sleep apnea on CPAP    Osteoarthritis of knee    Personal history of pulmonary embolism    PTSD (post-traumatic stress disorder) 08/18/2011   Pulmonary emboli ([place]) 09/15/2017   Renal disorder    kideny stones   Restless leg syndrome    Severe recurrent major depression without psychotic features ([place]) 02/17/2018   Sleep apnea 08/18/2011   Thoracic aortic aneurysm ([name])    Thoracic aortic aneurysm without rupture ([name])    Urinary tract infection    hx of chronic uti per pt   UTI (urinary tract infection)    recurrent    Past Surgical History:  Procedure Laterality Date   arm surgery Bilateral Skin removal   DILATATION & CURETTAGE/HYSTEROSCOPY WITH MYOSURE N/A 06/11/2018   Procedure: DILATATION & CURETTAGE/HYSTEROSCOPY WITH MYOSURE RESECTION OF ENDOMETRIAL THICKENING;  Surgeon: [name], MD;  Location: [facility];  Service: Gynecology;  Laterality: N/A;  BMI 56   DILATION AND EVACUATION      ESOPHAGOGASTRODUODENOSCOPY (EGD) WITH PROPOFOL N/A 07/15/2018   Procedure: ESOPHAGOGASTRODUODENOSCOPY (EGD) WITH PROPOFOL;  Surgeon: [name], MD;  Location[facility];  Service: Endoscopy;  Laterality: N/A;   ESOPHAGOGASTRODUODENOSCOPY (EGD) WITH PROPOFOL N/A 04/27/2019   Procedure: ESOPHAGOGASTRODUODENOSCOPY (EGD) WITH PROPOFOL;  Surgeon: [name], MD;  [facility];  Service: Endoscopy;  Laterality: N/A;   IR ABLATE LIVER CRYOABLATION  10/28/2019   IR ABLATE LIVER CRYOABLATION  05/07/2020   IR RADIOLOGIST EVAL & MGMT  10/14/2019   IR RADIOLOGIST EVAL & MGMT  04/03/2020   KIDNEY STONE SURGERY     KNEE SURGERY Right    LUMBAR LAMINECTOMY/DECOMPRESSION MICRODISCECTOMY N/A 11/08/2020   Procedure: LUMBAR FIVE AND SACRAL ONE LAMINECTOMY/DECOMPRESSION MICRODISCECTOMY 1 LEVEL;  Surgeon: Melina Schools, MD;  Location: [facility];  Service: Orthopedics;  Laterality: N/A;   TONSILLECTOMY      MEDICATIONS:  acetaminophen (TYLENOL) 500 MG tablet   albuterol (VENTOLIN HFA) 108 (90 Base) MCG/ACT inhaler   cloNIDine (CATAPRES) 0.3 MG tablet   enoxaparin (LOVENOX) 120 MG/0.8ML injection   Fluticasone-Umeclidin-Vilant (TRELEGY ELLIPTA) 200-62.5-25 MCG/ACT AEPB  gabapentin (NEURONTIN) 300 MG capsule   ipratropium (ATROVENT) 0.03 % nasal spray   lamoTRIgine (LAMICTAL) 150 MG tablet   metFORMIN (GLUCOPHAGE) 500 MG tablet   nitrofurantoin, macrocrystal-monohydrate, (MACROBID) 100 MG capsule   ondansetron (ZOFRAN-ODT) 4 MG disintegrating tablet   oxymetazoline (AFRIN) 0.05 % nasal spray   rivaroxaban (XARELTO) 20 MG TABS tablet   rOPINIRole (REQUIP) 4 MG tablet   traMADol (ULTRAM) 50 MG tablet   triamcinolone (KENALOG) 0.1 % paste   TRINTELLIX 20 MG TABS tablet   Vitamin D, Ergocalciferol, (DRISDOL) 1.25 MG (50000 UNIT) CAPS capsule   No current facility-administered medications for this encounter.    Jodell Cipro Ward, PA-C [facility] [phone]

## 2021-10-31 NOTE — Anesthesia Preprocedure Evaluation (Signed)
Anesthesia Evaluation    Airway        Dental   Pulmonary           Cardiovascular      Neuro/Psych    GI/Hepatic   Endo/Other  diabetes  Renal/GU      Musculoskeletal   Abdominal   Peds  Hematology   Anesthesia Other Findings   Reproductive/Obstetrics                             Anesthesia Physical Anesthesia Plan  ASA:   Anesthesia Plan:    Post-op Pain Management:    Induction:   PONV Risk Score and Plan:   Airway Management Planned:   Additional Equipment:   Intra-op Plan:   Post-operative Plan:   Informed Consent:   Plan Discussed with:   Anesthesia Plan Comments: (See PAT note 10/30/2021)        Anesthesia Quick Evaluation

## 2021-11-01 ENCOUNTER — Telehealth: Payer: Self-pay | Admitting: Pulmonary Disease

## 2021-11-04 MED ORDER — TRANEXAMIC ACID 1000 MG/10ML IV SOLN
2000.0000 mg | INTRAVENOUS | Status: DC
  Filled 2021-11-04: qty 20

## 2021-11-05 ENCOUNTER — Other Ambulatory Visit: Payer: Self-pay

## 2021-11-05 ENCOUNTER — Ambulatory Visit (HOSPITAL_BASED_OUTPATIENT_CLINIC_OR_DEPARTMENT_OTHER): Payer: Commercial Managed Care - PPO | Admitting: Certified Registered Nurse Anesthetist

## 2021-11-05 ENCOUNTER — Ambulatory Visit (HOSPITAL_COMMUNITY): Payer: Commercial Managed Care - PPO | Admitting: Physician Assistant

## 2021-11-05 ENCOUNTER — Encounter (HOSPITAL_COMMUNITY): Payer: Self-pay | Admitting: Orthopaedic Surgery

## 2021-11-05 ENCOUNTER — Observation Stay (HOSPITAL_COMMUNITY)
Admission: RE | Admit: 2021-11-05 | Discharge: 2021-11-06 | Disposition: A | Discharge status: Home Health | Payer: Commercial Managed Care - PPO | Source: Ambulatory Visit | Attending: Orthopaedic Surgery | Admitting: Orthopaedic Surgery

## 2021-11-05 ENCOUNTER — Encounter (HOSPITAL_COMMUNITY)
Admission: RE | Disposition: A | Discharge status: Home Health | Payer: Self-pay | Source: Ambulatory Visit | Attending: Orthopaedic Surgery

## 2021-11-05 DIAGNOSIS — E119 Type 2 diabetes mellitus without complications: Secondary | ICD-10-CM

## 2021-11-05 DIAGNOSIS — Z7984 Long term (current) use of oral hypoglycemic drugs: Secondary | ICD-10-CM | POA: Insufficient documentation

## 2021-11-05 DIAGNOSIS — M1711 Unilateral primary osteoarthritis, right knee: Principal | ICD-10-CM | POA: Diagnosis present

## 2021-11-05 DIAGNOSIS — Z79899 Other long term (current) drug therapy: Secondary | ICD-10-CM | POA: Insufficient documentation

## 2021-11-05 DIAGNOSIS — Z9989 Dependence on other enabling machines and devices: Secondary | ICD-10-CM

## 2021-11-05 DIAGNOSIS — Z86711 Personal history of pulmonary embolism: Secondary | ICD-10-CM | POA: Diagnosis not present

## 2021-11-05 DIAGNOSIS — G4733 Obstructive sleep apnea (adult) (pediatric): Secondary | ICD-10-CM

## 2021-11-05 DIAGNOSIS — Z8616 Personal history of COVID-19: Secondary | ICD-10-CM | POA: Insufficient documentation

## 2021-11-05 DIAGNOSIS — Z01818 Encounter for other preprocedural examination: Secondary | ICD-10-CM

## 2021-11-05 HISTORY — PX: TOTAL KNEE ARTHROPLASTY: SHX125

## 2021-11-05 LAB — GLUCOSE, CAPILLARY
Glucose-Capillary: 109 mg/dL — ABNORMAL HIGH (ref 70–99)
Glucose-Capillary: 124 mg/dL — ABNORMAL HIGH (ref 70–99)

## 2021-11-05 LAB — TYPE AND SCREEN
ABO/RH(D): A POS
Antibody Screen: NEGATIVE

## 2021-11-05 SURGERY — ARTHROPLASTY, KNEE, TOTAL
Anesthesia: Spinal | Site: Knee | Laterality: Right

## 2021-11-05 MED ORDER — ROPIVACAINE HCL 5 MG/ML IJ SOLN
INTRAMUSCULAR | Status: DC | PRN
  Administered 2021-11-05: 30 mL via PERINEURAL

## 2021-11-05 MED ORDER — MORPHINE SULFATE (PF) 2 MG/ML IV SOLN
0.5000 mg | INTRAVENOUS | Status: DC | PRN
  Administered 2021-11-05 (×3): 1 mg via INTRAVENOUS
  Filled 2021-11-05 (×3): qty 1

## 2021-11-05 MED ORDER — HYDROCODONE-ACETAMINOPHEN 7.5-325 MG PO TABS
1.0000 | ORAL_TABLET | ORAL | Status: DC | PRN
  Administered 2021-11-05 – 2021-11-06 (×4): 2 via ORAL
  Administered 2021-11-06: 1 via ORAL
  Filled 2021-11-05 (×3): qty 2
  Filled 2021-11-05: qty 1
  Filled 2021-11-05: qty 2

## 2021-11-05 MED ORDER — AMISULPRIDE (ANTIEMETIC) 5 MG/2ML IV SOLN
10.0000 mg | Freq: Once | INTRAVENOUS | Status: DC | PRN
Start: 2021-11-05 — End: 2021-11-05

## 2021-11-05 MED ORDER — CEFAZOLIN IN SODIUM CHLORIDE 3-0.9 GM/100ML-% IV SOLN
3.0000 g | INTRAVENOUS | Status: AC
  Administered 2021-11-05: 3 g via INTRAVENOUS
  Filled 2021-11-05: qty 100

## 2021-11-05 MED ORDER — ACETAMINOPHEN 500 MG PO TABS
1000.0000 mg | ORAL_TABLET | Freq: Once | ORAL | Status: AC
Start: 2021-11-05 — End: 2021-11-05
  Administered 2021-11-05: 1000 mg via ORAL
  Filled 2021-11-05: qty 2

## 2021-11-05 MED ORDER — KETOROLAC TROMETHAMINE 15 MG/ML IJ SOLN
15.0000 mg | Freq: Four times a day (QID) | INTRAMUSCULAR | Status: AC
  Administered 2021-11-05 – 2021-11-06 (×4): 15 mg via INTRAVENOUS
  Filled 2021-11-05 (×4): qty 1

## 2021-11-05 MED ORDER — METOCLOPRAMIDE HCL 5 MG PO TABS: 5.0000 mg | ORAL_TABLET | Freq: Three times a day (TID) | ORAL | Status: DC | PRN

## 2021-11-05 MED ORDER — PHENOL 1.4 % MT LIQD: 1.0000 | OROMUCOSAL | Status: DC | PRN

## 2021-11-05 MED ORDER — ORAL CARE MOUTH RINSE: 15.0000 mL | Freq: Once | OROMUCOSAL | Status: AC

## 2021-11-05 MED ORDER — LIDOCAINE HCL (PF) 2 % IJ SOLN
INTRAMUSCULAR | Status: AC
  Filled 2021-11-05: qty 5

## 2021-11-05 MED ORDER — BUPIVACAINE-EPINEPHRINE 0.5% -1:200000 IJ SOLN
INTRAMUSCULAR | Status: DC | PRN
  Administered 2021-11-05: 30 mL

## 2021-11-05 MED ORDER — BUPIVACAINE LIPOSOME 1.3 % IJ SUSP
INTRAMUSCULAR | Status: AC
  Filled 2021-11-05: qty 20

## 2021-11-05 MED ORDER — DEXAMETHASONE SODIUM PHOSPHATE 10 MG/ML IJ SOLN
INTRAMUSCULAR | Status: AC
  Filled 2021-11-05: qty 1

## 2021-11-05 MED ORDER — METFORMIN HCL 500 MG PO TABS
500.0000 mg | ORAL_TABLET | Freq: Two times a day (BID) | ORAL | Status: DC
Start: 2021-11-05 — End: 2021-11-06
  Administered 2021-11-05 – 2021-11-06 (×2): 500 mg via ORAL
  Filled 2021-11-05 (×2): qty 1

## 2021-11-05 MED ORDER — ONDANSETRON HCL 4 MG/2ML IJ SOLN: 4.0000 mg | Freq: Once | INTRAMUSCULAR | Status: DC | PRN

## 2021-11-05 MED ORDER — FENTANYL CITRATE (PF) 250 MCG/5ML IJ SOLN
INTRAMUSCULAR | Status: AC
  Filled 2021-11-05: qty 5

## 2021-11-05 MED ORDER — DOCUSATE SODIUM 100 MG PO CAPS
100.0000 mg | ORAL_CAPSULE | Freq: Two times a day (BID) | ORAL | Status: DC
  Administered 2021-11-05 – 2021-11-06 (×2): 100 mg via ORAL
  Filled 2021-11-05 (×2): qty 1

## 2021-11-05 MED ORDER — FENTANYL CITRATE PF 50 MCG/ML IJ SOSY
25.0000 ug | PREFILLED_SYRINGE | INTRAMUSCULAR | Status: DC | PRN
  Administered 2021-11-05: 50 ug via INTRAVENOUS

## 2021-11-05 MED ORDER — MIDAZOLAM HCL 2 MG/2ML IJ SOLN
INTRAMUSCULAR | Status: AC
  Filled 2021-11-05: qty 2

## 2021-11-05 MED ORDER — CEFAZOLIN SODIUM-DEXTROSE 2-4 GM/100ML-% IV SOLN
2.0000 g | Freq: Four times a day (QID) | INTRAVENOUS | Status: AC
  Administered 2021-11-05 (×2): 2 g via INTRAVENOUS
  Filled 2021-11-05 (×2): qty 100

## 2021-11-05 MED ORDER — TRANEXAMIC ACID 1000 MG/10ML IV SOLN
INTRAVENOUS | Status: DC | PRN
  Administered 2021-11-05: 2000 mg via TOPICAL

## 2021-11-05 MED ORDER — POVIDONE-IODINE 10 % EX SWAB
2.0000 "application " | Freq: Once | CUTANEOUS | Status: DC
  Administered 2021-11-05: 2 via TOPICAL

## 2021-11-05 MED ORDER — MIDAZOLAM HCL 5 MG/5ML IJ SOLN
INTRAMUSCULAR | Status: DC | PRN
  Administered 2021-11-05 (×2): 1 mg via INTRAVENOUS

## 2021-11-05 MED ORDER — PROPOFOL 500 MG/50ML IV EMUL
INTRAVENOUS | Status: AC
  Filled 2021-11-05: qty 50

## 2021-11-05 MED ORDER — METHOCARBAMOL 500 MG IVPB - SIMPLE MED
500.0000 mg | Freq: Four times a day (QID) | INTRAVENOUS | Status: DC | PRN
  Administered 2021-11-05: 500 mg via INTRAVENOUS

## 2021-11-05 MED ORDER — LACTATED RINGERS IV SOLN: INTRAVENOUS | Status: DC

## 2021-11-05 MED ORDER — PROPOFOL 1000 MG/100ML IV EMUL
INTRAVENOUS | Status: AC
  Filled 2021-11-05: qty 100

## 2021-11-05 MED ORDER — BUPIVACAINE LIPOSOME 1.3 % IJ SUSP
INTRAMUSCULAR | Status: DC | PRN
  Administered 2021-11-05: 20 mL

## 2021-11-05 MED ORDER — METOCLOPRAMIDE HCL 5 MG/ML IJ SOLN: 5.0000 mg | Freq: Three times a day (TID) | INTRAMUSCULAR | Status: DC | PRN

## 2021-11-05 MED ORDER — ACETAMINOPHEN 325 MG PO TABS: 325.0000 mg | ORAL_TABLET | Freq: Four times a day (QID) | ORAL | Status: DC | PRN

## 2021-11-05 MED ORDER — DIPHENHYDRAMINE HCL 12.5 MG/5ML PO ELIX
12.5000 mg | ORAL_SOLUTION | ORAL | Status: DC | PRN
  Administered 2021-11-05: 25 mg via ORAL
  Filled 2021-11-05: qty 10

## 2021-11-05 MED ORDER — 0.9 % SODIUM CHLORIDE (POUR BTL) OPTIME
TOPICAL | Status: DC | PRN
  Administered 2021-11-05: 1000 mL

## 2021-11-05 MED ORDER — BISACODYL 5 MG PO TBEC: 5.0000 mg | DELAYED_RELEASE_TABLET | Freq: Every day | ORAL | Status: DC | PRN

## 2021-11-05 MED ORDER — OXYCODONE HCL 5 MG PO TABS: 5.0000 mg | ORAL_TABLET | Freq: Once | ORAL | Status: DC | PRN

## 2021-11-05 MED ORDER — IPRATROPIUM BROMIDE 0.03 % NA SOLN: 2.0000 | Freq: Every evening | NASAL | Status: DC | PRN

## 2021-11-05 MED ORDER — METHOCARBAMOL 500 MG IVPB - SIMPLE MED
INTRAVENOUS | Status: AC
  Filled 2021-11-05: qty 55

## 2021-11-05 MED ORDER — MENTHOL 3 MG MT LOZG: 1.0000 | LOZENGE | OROMUCOSAL | Status: DC | PRN

## 2021-11-05 MED ORDER — ALUM & MAG HYDROXIDE-SIMETH 200-200-20 MG/5ML PO SUSP: 30.0000 mL | ORAL | Status: DC | PRN

## 2021-11-05 MED ORDER — PROPOFOL 10 MG/ML IV BOLUS
INTRAVENOUS | Status: DC | PRN
  Administered 2021-11-05: 200 mg via INTRAVENOUS

## 2021-11-05 MED ORDER — ONDANSETRON HCL 4 MG/2ML IJ SOLN
4.0000 mg | Freq: Four times a day (QID) | INTRAMUSCULAR | Status: DC | PRN
  Administered 2021-11-06: 4 mg via INTRAVENOUS
  Filled 2021-11-05: qty 2

## 2021-11-05 MED ORDER — BUPIVACAINE LIPOSOME 1.3 % IJ SUSP: 20.0000 mL | Freq: Once | INTRAMUSCULAR | Status: DC

## 2021-11-05 MED ORDER — FENTANYL CITRATE PF 50 MCG/ML IJ SOSY
PREFILLED_SYRINGE | INTRAMUSCULAR | Status: AC
  Filled 2021-11-05: qty 1

## 2021-11-05 MED ORDER — ONDANSETRON HCL 4 MG/2ML IJ SOLN
INTRAMUSCULAR | Status: DC | PRN
  Administered 2021-11-05: 4 mg via INTRAVENOUS

## 2021-11-05 MED ORDER — NITROFURANTOIN MONOHYD MACRO 100 MG PO CAPS
100.0000 mg | ORAL_CAPSULE | Freq: Every day | ORAL | Status: DC
  Administered 2021-11-05 – 2021-11-06 (×2): 100 mg via ORAL
  Filled 2021-11-05 (×2): qty 1

## 2021-11-05 MED ORDER — ALBUTEROL SULFATE (2.5 MG/3ML) 0.083% IN NEBU: 2.5000 mg | INHALATION_SOLUTION | Freq: Four times a day (QID) | RESPIRATORY_TRACT | Status: DC | PRN

## 2021-11-05 MED ORDER — METHOCARBAMOL 500 MG PO TABS
500.0000 mg | ORAL_TABLET | Freq: Four times a day (QID) | ORAL | Status: DC | PRN
  Administered 2021-11-05 – 2021-11-06 (×2): 500 mg via ORAL
  Filled 2021-11-05 (×2): qty 1

## 2021-11-05 MED ORDER — FENTANYL CITRATE (PF) 100 MCG/2ML IJ SOLN
INTRAMUSCULAR | Status: AC
  Filled 2021-11-05: qty 2

## 2021-11-05 MED ORDER — HYDROCODONE-ACETAMINOPHEN 5-325 MG PO TABS: 1.0000 | ORAL_TABLET | ORAL | Status: DC | PRN

## 2021-11-05 MED ORDER — LAMOTRIGINE 25 MG PO TABS
150.0000 mg | ORAL_TABLET | Freq: Every day | ORAL | Status: DC
  Administered 2021-11-06: 150 mg via ORAL
  Filled 2021-11-05: qty 2

## 2021-11-05 MED ORDER — ONDANSETRON HCL 4 MG/2ML IJ SOLN
INTRAMUSCULAR | Status: AC
  Filled 2021-11-05: qty 2

## 2021-11-05 MED ORDER — ONDANSETRON HCL 4 MG PO TABS: 4.0000 mg | ORAL_TABLET | Freq: Four times a day (QID) | ORAL | Status: DC | PRN

## 2021-11-05 MED ORDER — GABAPENTIN 300 MG PO CAPS
300.0000 mg | ORAL_CAPSULE | Freq: Every day | ORAL | Status: DC
  Administered 2021-11-05: 300 mg via ORAL
  Filled 2021-11-05: qty 1

## 2021-11-05 MED ORDER — ROPINIROLE HCL 1 MG PO TABS
12.0000 mg | ORAL_TABLET | Freq: Every day | ORAL | Status: DC
Start: 2021-11-05 — End: 2021-11-06
  Administered 2021-11-05: 12 mg via ORAL
  Filled 2021-11-05: qty 12

## 2021-11-05 MED ORDER — TRANEXAMIC ACID-NACL 1000-0.7 MG/100ML-% IV SOLN
1000.0000 mg | INTRAVENOUS | Status: DC
  Filled 2021-11-05: qty 100

## 2021-11-05 MED ORDER — VORTIOXETINE HBR 5 MG PO TABS
20.0000 mg | ORAL_TABLET | Freq: Every day | ORAL | Status: DC
  Administered 2021-11-06: 20 mg via ORAL
  Filled 2021-11-05: qty 4

## 2021-11-05 MED ORDER — KETOROLAC TROMETHAMINE 30 MG/ML IJ SOLN
INTRAMUSCULAR | Status: DC | PRN
  Administered 2021-11-05: 30 mg via INTRAVENOUS

## 2021-11-05 MED ORDER — OXYCODONE HCL 5 MG/5ML PO SOLN: 5.0000 mg | Freq: Once | ORAL | Status: DC | PRN

## 2021-11-05 MED ORDER — FENTANYL CITRATE (PF) 100 MCG/2ML IJ SOLN
INTRAMUSCULAR | Status: DC | PRN
  Administered 2021-11-05 (×2): 50 ug via INTRAVENOUS
  Administered 2021-11-05: 100 ug via INTRAVENOUS
  Administered 2021-11-05 (×3): 50 ug via INTRAVENOUS

## 2021-11-05 MED ORDER — HYDROMORPHONE HCL 2 MG/ML IJ SOLN
INTRAMUSCULAR | Status: AC
  Filled 2021-11-05: qty 1

## 2021-11-05 MED ORDER — DEXAMETHASONE SODIUM PHOSPHATE 10 MG/ML IJ SOLN
INTRAMUSCULAR | Status: DC | PRN
  Administered 2021-11-05: 5 mg via INTRAVENOUS

## 2021-11-05 MED ORDER — BUPIVACAINE-EPINEPHRINE (PF) 0.5% -1:200000 IJ SOLN
INTRAMUSCULAR | Status: AC
  Filled 2021-11-05: qty 30

## 2021-11-05 MED ORDER — SODIUM CHLORIDE (PF) 0.9 % IJ SOLN
INTRAMUSCULAR | Status: DC | PRN
  Administered 2021-11-05: 30 mL

## 2021-11-05 MED ORDER — GABAPENTIN 300 MG PO CAPS
600.0000 mg | ORAL_CAPSULE | Freq: Every day | ORAL | Status: DC
  Administered 2021-11-06: 600 mg via ORAL
  Filled 2021-11-05: qty 2

## 2021-11-05 MED ORDER — CLONIDINE HCL 0.1 MG PO TABS
0.3000 mg | ORAL_TABLET | Freq: Every day | ORAL | Status: DC
  Administered 2021-11-05: 0.3 mg via ORAL
  Filled 2021-11-05: qty 3

## 2021-11-05 MED ORDER — CHLORHEXIDINE GLUCONATE 0.12 % MT SOLN
15.0000 mL | Freq: Once | OROMUCOSAL | Status: AC
  Administered 2021-11-05: 15 mL via OROMUCOSAL

## 2021-11-05 MED ORDER — LIDOCAINE 2% (20 MG/ML) 5 ML SYRINGE
INTRAMUSCULAR | Status: DC | PRN
  Administered 2021-11-05: 100 mg via INTRAVENOUS

## 2021-11-05 MED ORDER — HYDROMORPHONE HCL 1 MG/ML IJ SOLN
INTRAMUSCULAR | Status: DC | PRN
  Administered 2021-11-05 (×2): .5 mg via INTRAVENOUS
  Administered 2021-11-05: 1 mg via INTRAVENOUS

## 2021-11-05 MED ORDER — KETOROLAC TROMETHAMINE 30 MG/ML IJ SOLN
INTRAMUSCULAR | Status: AC
  Filled 2021-11-05: qty 1

## 2021-11-05 MED ORDER — ACETAMINOPHEN 500 MG PO TABS
500.0000 mg | ORAL_TABLET | Freq: Four times a day (QID) | ORAL | Status: AC
  Administered 2021-11-05 (×2): 500 mg via ORAL
  Filled 2021-11-05 (×2): qty 1

## 2021-11-05 MED ORDER — POVIDONE-IODINE 10 % EX SWAB: 2.0000 | Freq: Once | CUTANEOUS | Status: DC

## 2021-11-05 MED ORDER — SODIUM CHLORIDE 0.9 % IR SOLN
Status: DC | PRN
  Administered 2021-11-05: 3000 mL

## 2021-11-05 MED ORDER — PROPOFOL 500 MG/50ML IV EMUL
INTRAVENOUS | Status: DC | PRN
  Administered 2021-11-05: 150 ug/kg/min via INTRAVENOUS

## 2021-11-05 MED ORDER — SODIUM CHLORIDE (PF) 0.9 % IJ SOLN
INTRAMUSCULAR | Status: AC
  Filled 2021-11-05: qty 30

## 2021-11-05 MED ORDER — RIVAROXABAN 10 MG PO TABS
20.0000 mg | ORAL_TABLET | Freq: Every day | ORAL | Status: DC
  Administered 2021-11-06: 20 mg via ORAL
  Filled 2021-11-05: qty 2

## 2021-11-05 SURGICAL SUPPLY — 56 items
ATTUNE MED DOME PAT 38 KNEE (Knees) ×1 IMPLANT
ATTUNE PS FEM RT SZ 5 CEM KNEE (Femur) ×1 IMPLANT
ATTUNE PSRP INSR SZ5 6 KNEE (Insert) ×1 IMPLANT
BAG COUNTER SPONGE SURGICOUNT (BAG) ×2 IMPLANT
BAG DECANTER FOR FLEXI CONT (MISCELLANEOUS) ×2 IMPLANT
BAG SPEC THK2 15X12 ZIP CLS (MISCELLANEOUS) ×1
BAG SPNG CNTER NS LX DISP (BAG) ×1
BAG ZIPLOCK 12X15 (MISCELLANEOUS) ×2 IMPLANT
BASE TIBIAL ROT PLAT SZ 5 KNEE (Knees) IMPLANT
BLADE SAGITTAL 25.0X1.19X90 (BLADE) ×2 IMPLANT
BLADE SAW SGTL 11.0X1.19X90.0M (BLADE) ×2 IMPLANT
BLADE SURG SZ10 CARB STEEL (BLADE) ×2 IMPLANT
BNDG ELASTIC 6X5.8 VLCR STR LF (GAUZE/BANDAGES/DRESSINGS) ×2 IMPLANT
BOOTIES KNEE HIGH SLOAN (MISCELLANEOUS) ×2 IMPLANT
BOWL SMART MIX CTS (DISPOSABLE) ×2 IMPLANT
BSPLAT TIB 5 CMNT ROT PLAT STR (Knees) ×1 IMPLANT
CEMENT HV SMART SET (Cement) ×4 IMPLANT
COVER SURGICAL LIGHT HANDLE (MISCELLANEOUS) ×2 IMPLANT
CUFF TOURN SGL QUICK 34 (TOURNIQUET CUFF) ×2
CUFF TRNQT CYL 34X4.125X (TOURNIQUET CUFF) ×1 IMPLANT
DRAPE INCISE IOBAN 66X45 STRL (DRAPES) ×2 IMPLANT
DRAPE ORTHO 2.5IN SPLIT 77X108 (DRAPES) ×2 IMPLANT
DRAPE ORTHO SPLIT 77X108 STRL (DRAPES) ×4
DRAPE SHEET LG 3/4 BI-LAMINATE (DRAPES) ×2 IMPLANT
DRAPE U-SHAPE 47X51 STRL (DRAPES) ×2 IMPLANT
DRSG AQUACEL AG ADV 3.5X10 (GAUZE/BANDAGES/DRESSINGS) ×2 IMPLANT
DURAPREP 26ML APPLICATOR (WOUND CARE) ×4 IMPLANT
ELECT REM PT RETURN 15FT ADLT (MISCELLANEOUS) ×2 IMPLANT
GLOVE BIO SURGEON STRL SZ8 (GLOVE) ×4 IMPLANT
GLOVE BIOGEL PI IND STRL 8 (GLOVE) ×2 IMPLANT
GLOVE BIOGEL PI INDICATOR 8 (GLOVE) ×2
GOWN STRL REUS W/ TWL XL LVL3 (GOWN DISPOSABLE) ×2 IMPLANT
GOWN STRL REUS W/TWL XL LVL3 (GOWN DISPOSABLE) ×4
HANDPIECE INTERPULSE COAX TIP (DISPOSABLE) ×2
HOLDER FOLEY CATH W/STRAP (MISCELLANEOUS) IMPLANT
HOOD PEEL AWAY FLYTE STAYCOOL (MISCELLANEOUS) ×6 IMPLANT
KIT TURNOVER KIT A (KITS) ×1 IMPLANT
MANIFOLD NEPTUNE II (INSTRUMENTS) ×2 IMPLANT
NEEDLE HYPO 22GX1.5 SAFETY (NEEDLE) ×2 IMPLANT
NS IRRIG 1000ML POUR BTL (IV SOLUTION) ×2 IMPLANT
PACK TOTAL KNEE CUSTOM (KITS) ×2 IMPLANT
PAD ARMBOARD 7.5X6 YLW CONV (MISCELLANEOUS) ×2 IMPLANT
PROTECTOR NERVE ULNAR (MISCELLANEOUS) ×2 IMPLANT
SET HNDPC FAN SPRY TIP SCT (DISPOSABLE) ×1 IMPLANT
SPIKE FLUID TRANSFER (MISCELLANEOUS) ×4 IMPLANT
SUT ETHIBOND NAB CT1 #1 30IN (SUTURE) ×4 IMPLANT
SUT VIC AB 0 CT1 36 (SUTURE) ×2 IMPLANT
SUT VIC AB 2-0 CT1 27 (SUTURE) ×2
SUT VIC AB 2-0 CT1 TAPERPNT 27 (SUTURE) ×1 IMPLANT
SUT VICRYL AB 3-0 FS1 BRD 27IN (SUTURE) ×2 IMPLANT
SUT VLOC 180 0 24IN GS25 (SUTURE) ×2 IMPLANT
TIBIAL BASE ROT PLAT SZ 5 KNEE (Knees) ×2 IMPLANT
TRAY FOLEY MTR SLVR 16FR STAT (SET/KITS/TRAYS/PACK) IMPLANT
WATER STERILE IRR 1000ML POUR (IV SOLUTION) ×2 IMPLANT
WRAP KNEE MAXI GEL POST OP (GAUZE/BANDAGES/DRESSINGS) ×2 IMPLANT
YANKAUER SUCT BULB TIP NO VENT (SUCTIONS) ×2 IMPLANT

## 2021-11-05 NOTE — Interval H&P Note (Signed)
History and Physical Interval Note:  11/05/2021 7:32 AM  Erika Woods  has presented today for surgery, with the diagnosis of right knee degenerative joint disease.  The various methods of treatment have been discussed with the patient and family. After consideration of risks, benefits and other options for treatment, the patient has consented to  Procedure(s): RIGHT TOTAL KNEE ARTHROPLASTY (Right) as a surgical intervention.  The patient's history has been reviewed, patient examined, no change in status, stable for surgery.  I have reviewed the patient's chart and labs.  Questions were answered to the patient's satisfaction.     [name]

## 2021-11-06 ENCOUNTER — Encounter (HOSPITAL_COMMUNITY): Payer: Self-pay | Admitting: Orthopaedic Surgery

## 2021-11-06 DIAGNOSIS — M1711 Unilateral primary osteoarthritis, right knee: Secondary | ICD-10-CM | POA: Diagnosis not present

## 2021-11-06 MED ORDER — TIZANIDINE HCL 4 MG PO TABS
4.0000 mg | ORAL_TABLET | Freq: Four times a day (QID) | ORAL | 1 refills | Status: DC | PRN
Start: 2021-11-06 — End: 2021-12-17

## 2021-11-06 MED ORDER — OXYCODONE-ACETAMINOPHEN 5-325 MG PO TABS: 1.0000 | ORAL_TABLET | Freq: Four times a day (QID) | ORAL | 0 refills | Status: DC | PRN

## 2021-11-06 NOTE — Progress Notes (Signed)
Subjective: 1 Day Post-Op Procedure(s) (LRB): RIGHT TOTAL KNEE ARTHROPLASTY (Right)  Patient feeling better this morning. She is hoping to go home today.  Activity level:  wbat Diet tolerance:  ok Voiding:  ok Patient reports pain as mild.    Objective: Vital signs in last 24 hours: Temp:  [97.5 F (36.4 C)-98.7 F (37.1 C)] 97.9 F (36.6 C) (06/28 0636) Pulse Rate:  [52-73] 54 (06/28 0636) Resp:  [11-19] 18 (06/28 0636) BP: (101-162)/(53-97) 101/53 (06/28 0636) SpO2:  [95 %-100 %] 98 % (06/28 0636) Weight:  [122.6 kg] 122.6 kg (06/27 1128)  Labs: No results for input(s): "HGB" in the last 72 hours. No results for input(s): "WBC", "RBC", "HCT", "PLT" in the last 72 hours. No results for input(s): "NA", "K", "CL", "CO2", "BUN", "CREATININE", "GLUCOSE", "CALCIUM" in the last 72 hours. No results for input(s): "LABPT", "INR" in the last 72 hours.  Physical Exam:  Neurologically intact ABD soft Neurovascular intact Sensation intact distally Intact pulses distally Dorsiflexion/Plantar flexion intact Incision: dressing C/D/I and no drainage No cellulitis present Compartment soft  Assessment/Plan:  1 Day Post-Op Procedure(s) (LRB): RIGHT TOTAL KNEE ARTHROPLASTY (Right) Advance diet Up with therapy D/C IV fluids Discharge home with home health today if cleared by PT and doing well.  Follow up in office 2 weeks post op. Resume home xarelto.    Erika Woods Erika Woods 11/06/2021, 8:19 AM

## 2021-11-06 NOTE — TOC Transition Note (Signed)
Transition of Care [facility]) - CM/SW Discharge Note   Patient Details  Name: Erika Woods MRN: [id number] Date of Birth: October 24, 1965  [facility] CM/SW Contact:  Lennart Pall, LCSW Phone Number: 11/06/2021, 9:28 AM   Clinical Narrative:     Met with pt and confirming she has all needed DME at home. HHPT prearranged with [facility].  No TOC needs.  Final next level of care: [place] Barriers to Discharge: No Barriers Identified   Patient Goals and CMS Choice Patient states their goals for this hospitalization and ongoing recovery are:: return home      Discharge Placement                       Discharge Plan and Services                DME Arranged: N/A DME Agency: NA       HH Arranged: PT [place] Agency: [facility]        Social Determinants of Health (SDOH) Interventions     Readmission Risk Interventions    11/12/2020    1:44 PM  Readmission Risk Prevention Plan  Post Dischage Appt Complete  Medication Screening Complete  Transportation Screening Complete

## 2021-11-06 NOTE — Discharge Summary (Deleted)
Patient ID: Erika Woods MRN: [id number] DOB/AGE: 1966-05-01 56 y.o.  Admit date: 11/05/2021 Discharge date: 11/06/2021  Admission Diagnoses:  Principal Problem:   Primary osteoarthritis of right knee Active Problems:   Primary localized osteoarthritis of right knee   Discharge Diagnoses:  Same  Past Medical History:  Diagnosis Date   Acute pulmonary embolism ([name]) 11/11/2018   Acute pulmonary embolism with acute cor pulmonale ([name]) 09/16/2017   Acute superficial venous thrombosis of left lower extremity    Aortic insufficiency    Echocardiogram 08/2019: prob bicuspid AoV, mild to mod AI, mild AS (mean 12 mmHg), EF 55-60, no RWMA, Gr 1 DD, GLS -22.2%, normal RVSF, Ao Root 39 mm   Coronary CTA    Coronary CTA 08/2019: Calcium score 0, no evidence of CAD, ascending aorta 4 cm   Diabetes mellitus without complication ([name])    DM (diabetes mellitus) ([name]) 08/18/2011   GERD (gastroesophageal reflux disease)    Heart murmur    History of kidney stones    Impaired fasting glucose    Mood disorder ([name]) 08/18/2011   Obesity, Class III, BMI 40-49.9 (morbid obesity) ([name]) 09/16/2017   Obstructive sleep apnea on CPAP    Osteoarthritis of knee    Personal history of pulmonary embolism    PTSD (post-traumatic stress disorder) 08/18/2011   Pulmonary emboli ([name]) 09/15/2017   Renal disorder    kideny stones   Restless leg syndrome    Severe recurrent major depression without psychotic features ([name]) 02/17/2018   Sleep apnea 08/18/2011   Thoracic aortic aneurysm [facility])    Thoracic aortic aneurysm without rupture ([name])    Urinary tract infection    hx of chronic uti per pt   UTI (urinary tract infection)    recurrent    Surgeries: Procedure(s): RIGHT TOTAL KNEE ARTHROPLASTY on 11/05/2021   Consultants:   Discharged Condition: Improved  Hospital Course: Erika Woods is an 56 y.o. female who was admitted 11/05/2021 for operative treatment ofPrimary osteoarthritis of right knee.  Patient has severe unremitting pain that affects sleep, daily activities, and work/hobbies. After pre-op clearance the patient was taken to the operating room on 11/05/2021 and underwent  Procedure(s): RIGHT TOTAL KNEE ARTHROPLASTY.    Patient was given perioperative antibiotics:  Anti-infectives (From admission, onward)    Start     Dose/Rate Route Frequency Ordered Stop   11/05/21 1500  nitrofurantoin (macrocrystal-monohydrate) (MACROBID) capsule 100 mg        100 mg Oral Daily 11/05/21 1103     11/05/21 1400  ceFAZolin (ANCEF) IVPB 2g/100 mL premix        2 g 200 mL/hr over 30 Minutes Intravenous Every 6 hours 11/05/21 1103 11/05/21 2131   11/05/21 0600  ceFAZolin (ANCEF) IVPB 3g/100 mL premix        3 g 200 mL/hr over 30 Minutes Intravenous On call to O.R. 11/05/21 0532 11/05/21 0756        Patient was given sequential compression devices, early ambulation, and chemoprophylaxis to prevent DVT.  Patient benefited maximally from hospital stay and there were no complications.    Recent vital signs: Patient Vitals for the past 24 hrs:  BP Temp Temp src Pulse Resp SpO2 Height Weight  11/06/21 0636 (!) 101/53 97.9 F (36.6 C) Oral (!) 54 18 98 % -- --  11/06/21 0233 116/66 97.6 F (36.4 C) Oral (!) 55 18 95 % -- --  11/05/21 2211 110/61 98.7 F (37.1 C) Oral (!) 58 18 98 % -- --  11/05/21 1747 122/63 (!) 97.5 F (36.4 C) -- (!) 57 18 100 % -- --  11/05/21 1323 134/78 (!) 97.5 F (36.4 C) -- 66 18 100 % -- --  11/05/21 1128 -- -- -- -- -- -- 5\' 5"  (1.651 m) 122.6 kg  11/05/21 1104 (!) 145/95 97.8 F (36.6 C) Oral (!) 52 16 100 % -- --  11/05/21 1045 (!) 152/97 (!) 97.5 F (36.4 C) -- 69 19 100 % -- --  11/05/21 1030 (!) 162/89 -- -- 70 13 98 % -- --  11/05/21 1015 (!) 155/94 -- -- 73 17 97 % -- --  11/05/21 1000 (!) 145/85 -- -- 70 14 100 % -- --  11/05/21 0945 134/75 -- -- 70 12 98 % -- --  11/05/21 0943 128/78 97.7 F (36.5 C) -- 70 11 96 % -- --     Recent laboratory  studies: No results for input(s): "WBC", "HGB", "HCT", "PLT", "NA", "K", "CL", "CO2", "BUN", "CREATININE", "GLUCOSE", "INR", "CALCIUM" in the last 72 hours.  Invalid input(s): "PT", "2"   Discharge Medications:   Allergies as of 11/06/2021       Reactions   Sulfa Antibiotics Hives        Medication List     TAKE these medications    acetaminophen 500 MG tablet Commonly known as: TYLENOL Take 1,000-1,500 mg by mouth every 8 (eight) hours as needed for moderate pain.   albuterol 108 (90 Base) MCG/ACT inhaler Commonly known as: VENTOLIN HFA Inhale 2 puffs into the lungs every 6 (six) hours as needed for wheezing or shortness of breath.   cloNIDine 0.3 MG tablet Commonly known as: CATAPRES Take 0.3 mg by mouth at bedtime.   enoxaparin 120 MG/0.8ML injection Commonly known as: LOVENOX Inject 0.8 mLs (120 mg total) into the skin every 12 (twelve) hours. On 11/03/21 and 11/04/21   gabapentin 300 MG capsule Commonly known as: NEURONTIN Take 300-600 mg by mouth See admin instructions. Taking 2 capsules (600 mg) in the am and 300 mg at bedtime   ipratropium 0.03 % nasal spray Commonly known as: ATROVENT Place 2 sprays into both nostrils at bedtime as needed for allergies.   lamoTRIgine 150 MG tablet Commonly known as: LAMICTAL Take 150 mg by mouth daily after breakfast.   metFORMIN 500 MG tablet Commonly known as: GLUCOPHAGE Take 500 mg by mouth 2 (two) times daily with a meal.   nitrofurantoin (macrocrystal-monohydrate) 100 MG capsule Commonly known as: MACROBID Take 100 mg by mouth daily.   ondansetron 4 MG disintegrating tablet Commonly known as: ZOFRAN-ODT 4mg  ODT q4 hours prn nausea/vomit   oxyCODONE-acetaminophen 5-325 MG tablet Commonly known as: Percocet Take 1-2 tablets by mouth every 6 (six) hours as needed for severe pain or moderate pain (post op pain).   oxymetazoline 0.05 % nasal spray Commonly known as: AFRIN Place 1 spray into both nostrils 2 (two)  times daily as needed for congestion.   rivaroxaban 20 MG Tabs tablet Commonly known as: XARELTO Take 1 tablet (20 mg total) by mouth daily with supper.   rOPINIRole 4 MG tablet Commonly known as: REQUIP Take 12 mg by mouth at bedtime.   tiZANidine 4 MG tablet Commonly known as: Zanaflex Take 1 tablet (4 mg total) by mouth every 6 (six) hours as needed for muscle spasms.   traMADol 50 MG tablet Commonly known as: ULTRAM Take 50 mg by mouth 2 (two) times daily as needed for moderate pain.   Trelegy Ellipta 200-62.5-25 MCG/ACT  Aepb Generic drug: Fluticasone-Umeclidin-Vilant Inhale 1 puff into the lungs daily.   triamcinolone 0.1 % paste Commonly known as: KENALOG Use as directed 1 application  in the mouth or throat 2 (two) times daily as needed for mouth pain.   Trintellix 20 MG Tabs tablet Generic drug: vortioxetine HBr Take 20 mg by mouth daily.   Vitamin D (Ergocalciferol) 1.25 MG (50000 UNIT) Caps capsule Commonly known as: DRISDOL Take 50,000 Units by mouth once a week.               Durable Medical Equipment  (From admission, onward)           Start     Ordered   11/05/21 1104  DME Walker rolling  Once       Question:  Patient needs a walker to treat with the following condition  Answer:  Primary osteoarthritis of right knee   11/05/21 1103   11/05/21 1104  DME 3 n 1  Once        11/05/21 1103   11/05/21 1104  DME Bedside commode  Once       Question:  Patient needs a bedside commode to treat with the following condition  Answer:  Primary osteoarthritis of right knee   11/05/21 1103            Diagnostic Studies: DG Chest 2 View  Result Date: 10/30/2021 CLINICAL DATA:  Preoperative study prior to total knee replacement EXAM: CHEST - 2 VIEW COMPARISON:  June 05, 2021 FINDINGS: The heart size and mediastinal contours are within normal limits. Both lungs are clear. The visualized skeletal structures are unremarkable. IMPRESSION: No active  cardiopulmonary disease. Electronically Signed   By: Gerome Sam III M.D.   On: 10/30/2021 15:52    Disposition: Discharge disposition: 01-Home or Self Care       Discharge Instructions     Call MD / Call 911   Complete by: As directed    If you experience chest pain or shortness of breath, CALL 911 and be transported to the hospital emergency room.  If you develope a fever above 101 F, pus (white drainage) or increased drainage or redness at the wound, or calf pain, call your surgeon's office.   Constipation Prevention   Complete by: As directed    Drink plenty of fluids.  Prune juice may be helpful.  You may use a stool softener, such as Colace (over the counter) 100 mg twice a day.  Use MiraLax (over the counter) for constipation as needed.   Diet - low sodium heart healthy   Complete by: As directed    Discharge instructions   Complete by: As directed    INSTRUCTIONS AFTER JOINT REPLACEMENT   Remove items at home which could result in a fall. This includes throw rugs or furniture in walking pathways ICE to the affected joint every three hours while awake for 30 minutes at a time, for at least the first 3-5 days, and then as needed for pain and swelling.  Continue to use ice for pain and swelling. You may notice swelling that will progress down to the foot and ankle.  This is normal after surgery.  Elevate your leg when you are not up walking on it.   Continue to use the breathing machine you got in the hospital (incentive spirometer) which will help keep your temperature down.  It is common for your temperature to cycle up and down following surgery, especially at night when you  are not up moving around and exerting yourself.  The breathing machine keeps your lungs expanded and your temperature down.   DIET:  As you were doing prior to hospitalization, we recommend a well-balanced diet.  DRESSING / WOUND CARE / SHOWERING  You may shower 3 days after surgery, but keep the  wounds dry during showering.  You may use an occlusive plastic wrap (Press'n Seal for example), NO SOAKING/SUBMERGING IN THE BATHTUB.  If the bandage gets wet, change with a clean dry gauze.  If the incision gets wet, pat the wound dry with a clean towel.  ACTIVITY  Increase activity slowly as tolerated, but follow the weight bearing instructions below.   No driving for 6 weeks or until further direction given by your physician.  You cannot drive while taking narcotics.  No lifting or carrying greater than 10 lbs. until further directed by your surgeon. Avoid periods of inactivity such as sitting longer than an hour when not asleep. This helps prevent blood clots.  You may return to work once you are authorized by your doctor.     WEIGHT BEARING   Weight bearing as tolerated with assist device (walker, cane, etc) as directed, use it as long as suggested by your surgeon or therapist, typically at least 4-6 weeks.   EXERCISES  Results after joint replacement surgery are often greatly improved when you follow the exercise, range of motion and muscle strengthening exercises prescribed by your doctor. Safety measures are also important to protect the joint from further injury. Any time any of these exercises cause you to have increased pain or swelling, decrease what you are doing until you are comfortable again and then slowly increase them. If you have problems or questions, call your caregiver or physical therapist for advice.   Rehabilitation is important following a joint replacement. After just a few days of immobilization, the muscles of the leg can become weakened and shrink (atrophy).  These exercises are designed to build up the tone and strength of the thigh and leg muscles and to improve motion. Often times heat used for twenty to thirty minutes before working out will loosen up your tissues and help with improving the range of motion but do not use heat for the first two weeks following  surgery (sometimes heat can increase post-operative swelling).   These exercises can be done on a training (exercise) mat, on the floor, on a table or on a bed. Use whatever works the best and is most comfortable for you.    Use music or television while you are exercising so that the exercises are a pleasant break in your day. This will make your life better with the exercises acting as a break in your routine that you can look forward to.   Perform all exercises about fifteen times, three times per day or as directed.  You should exercise both the operative leg and the other leg as well.  Exercises include:   Quad Sets - Tighten up the muscle on the front of the thigh (Quad) and hold for 5-10 seconds.   Straight Leg Raises - With your knee straight (if you were given a brace, keep it on), lift the leg to 60 degrees, hold for 3 seconds, and slowly lower the leg.  Perform this exercise against resistance later as your leg gets stronger.  Leg Slides: Lying on your back, slowly slide your foot toward your buttocks, bending your knee up off the floor (only go as far  as is comfortable). Then slowly slide your foot back down until your leg is flat on the floor again.  Angel Wings: Lying on your back spread your legs to the side as far apart as you can without causing discomfort.  Hamstring Strength:  Lying on your back, push your heel against the floor with your leg straight by tightening up the muscles of your buttocks.  Repeat, but this time bend your knee to a comfortable angle, and push your heel against the floor.  You may put a pillow under the heel to make it more comfortable if necessary.   A rehabilitation program following joint replacement surgery can speed recovery and prevent re-injury in the future due to weakened muscles. Contact your doctor or a physical therapist for more information on knee rehabilitation.    CONSTIPATION  Constipation is defined medically as fewer than three stools per  week and severe constipation as less than one stool per week.  Even if you have a regular bowel pattern at home, your normal regimen is likely to be disrupted due to multiple reasons following surgery.  Combination of anesthesia, postoperative narcotics, change in appetite and fluid intake all can affect your bowels.   YOU MUST use at least one of the following options; they are listed in order of increasing strength to get the job done.  They are all available over the counter, and you may need to use some, POSSIBLY even all of these options:    Drink plenty of fluids (prune juice may be helpful) and high fiber foods Colace 100 mg by mouth twice a day  Senokot for constipation as directed and as needed Dulcolax (bisacodyl), take with full glass of water  Miralax (polyethylene glycol) once or twice a day as needed.  If you have tried all these things and are unable to have a bowel movement in the first 3-4 days after surgery call either your surgeon or your primary doctor.    If you experience loose stools or diarrhea, hold the medications until you stool forms back up.  If your symptoms do not get better within 1 week or if they get worse, check with your doctor.  If you experience "the worst abdominal pain ever" or develop nausea or vomiting, please contact the office immediately for further recommendations for treatment.   ITCHING:  If you experience itching with your medications, try taking only a single pain pill, or even half a pain pill at a time.  You can also use Benadryl over the counter for itching or also to help with sleep.   TED HOSE STOCKINGS:  Use stockings on both legs until for at least 2 weeks or as directed by physician office. They may be removed at night for sleeping.  MEDICATIONS:  See your medication summary on the "After Visit Summary" that nursing will review with you.  You may have some home medications which will be placed on hold until you complete the course of blood  thinner medication.  It is important for you to complete the blood thinner medication as prescribed.  PRECAUTIONS:  If you experience chest pain or shortness of breath - call 911 immediately for transfer to the hospital emergency department.   If you develop a fever greater that 101 F, purulent drainage from wound, increased redness or drainage from wound, foul odor from the wound/dressing, or calf pain - CONTACT YOUR SURGEON.  FOLLOW-UP APPOINTMENTS:  If you do not already have a post-op appointment, please call the office for an appointment to be seen by your surgeon.  Guidelines for how soon to be seen are listed in your "After Visit Summary", but are typically between 1-4 weeks after surgery.  OTHER INSTRUCTIONS:   Knee Replacement:  Do not place pillow under knee, focus on keeping the knee straight while resting. CPM instructions: 0-90 degrees, 2 hours in the morning, 2 hours in the afternoon, and 2 hours in the evening. Place foam block, curve side up under heel at all times except when in CPM or when walking.  DO NOT modify, tear, cut, or change the foam block in any way.  POST-OPERATIVE OPIOID TAPER INSTRUCTIONS: It is important to wean off of your opioid medication as soon as possible. If you do not need pain medication after your surgery it is ok to stop day one. Opioids include: Codeine, Hydrocodone(Norco, Vicodin), Oxycodone(Percocet, oxycontin) and hydromorphone amongst others.  Long term and even short term use of opiods can cause: Increased pain response Dependence Constipation Depression Respiratory depression And more.  Withdrawal symptoms can include Flu like symptoms Nausea, vomiting And more Techniques to manage these symptoms Hydrate well Eat regular healthy meals Stay active Use relaxation techniques(deep breathing, meditating, yoga) Do Not substitute Alcohol to help with tapering If you have been on opioids for  less than two weeks and do not have pain than it is ok to stop all together.  Plan to wean off of opioids This plan should start within one week post op of your joint replacement. Maintain the same interval or time between taking each dose and first decrease the dose.  Cut the total daily intake of opioids by one tablet each day Next start to increase the time between doses. The last dose that should be eliminated is the evening dose.     MAKE SURE YOU:  Understand these instructions.  Get help right away if you are not doing well or get worse.    Thank you for letting us be a part of your medical care team.  It is a privilege we respect greatly.  We hope these instructions will help you stay on track for a fast and full recovery!   Increase activity slowly as tolerated   Complete by: As directed    Post-operative opioid taper instructions:   Complete by: As directed    POST-OPERATIVE OPIOID TAPER INSTRUCTIONS: It is important to wean off of your opioid medication as soon as possible. If you do not need pain medication after your surgery it is ok to stop day one. Opioids include: Codeine, Hydrocodone(Norco, Vicodin), Oxycodone(Percocet, oxycontin) and hydromorphone amongst others.  Long term and even short term use of opiods can cause: Increased pain response Dependence Constipation Depression Respiratory depression And more.  Withdrawal symptoms can include Flu like symptoms Nausea, vomiting And more Techniques to manage these symptoms Hydrate well Eat regular healthy meals Stay active Use relaxation techniques(deep breathing, meditating, yoga) Do Not substitute Alcohol to help with tapering If you have been on opioids for less than two weeks and do not have pain than it is ok to stop all together.  Plan to wean off of opioids This plan should start within one week post op of your joint replacement. Maintain the same interval or time between taking each dose and first  decrease the dose.  Cut the total daily intake of opioids by one tablet each day Next start to  increase the time between doses. The last dose that should be eliminated is the evening dose.           Follow-up Information     Marcene Corning, MD. Schedule an appointment as soon as possible for a visit in 2 week(s).   Specialty: Orthopedic Surgery Contact information: [address] [place] [address] [phone]                  Signed: Ginger Organ Tariyah Pendry 11/06/2021, 8:22 AM

## 2021-11-06 NOTE — Discharge Summary (Signed)
Patient ID: Erika Woods MRN: [id number] DOB/AGE: Sep 23, 1965 56 y.o.  Admit date: 11/05/2021 Discharge date: 11/06/2021  Admission Diagnoses:  Principal Problem:   Primary osteoarthritis of right knee Active Problems:   Primary localized osteoarthritis of right knee   Discharge Diagnoses:  Same  Past Medical History:  Diagnosis Date   Acute pulmonary embolism ([name]) 11/11/2018   Acute pulmonary embolism with acute cor pulmonale ([name]) 09/16/2017   Acute superficial venous thrombosis of left lower extremity    Aortic insufficiency    Echocardiogram 08/2019: prob bicuspid AoV, mild to mod AI, mild AS (mean 12 mmHg), EF 55-60, no RWMA, Gr 1 DD, GLS -22.2%, normal RVSF, Ao Root 39 mm   Coronary CTA    Coronary CTA 08/2019: Calcium score 0, no evidence of CAD, ascending aorta 4 cm   Diabetes mellitus without complication ([name])    DM (diabetes mellitus) ([name]) 08/18/2011   GERD (gastroesophageal reflux disease)    Heart murmur    History of kidney stones    Impaired fasting glucose    Mood disorder ([name]) 08/18/2011   Obesity, Class III, BMI 40-49.9 (morbid obesity) ([name]) 09/16/2017   Obstructive sleep apnea on CPAP    Osteoarthritis of knee    Personal history of pulmonary embolism    PTSD (post-traumatic stress disorder) 08/18/2011   Pulmonary emboli ([name]) 09/15/2017   Renal disorder    kideny stones   Restless leg syndrome    Severe recurrent major depression without psychotic features ([name]) 02/17/2018   Sleep apnea 08/18/2011   Thoracic aortic aneurysm [facility])    Thoracic aortic aneurysm without rupture ([name])    Urinary tract infection    hx of chronic uti per pt   UTI (urinary tract infection)    recurrent    Surgeries: Procedure(s): RIGHT TOTAL KNEE ARTHROPLASTY on 11/05/2021   Consultants:   Discharged Condition: Improved  Hospital Course: Erika Woods is an 56 y.o. female who was admitted 11/05/2021 for operative treatment ofPrimary osteoarthritis of right knee.  Patient has severe unremitting pain that affects sleep, daily activities, and work/hobbies. After pre-op clearance the patient was taken to the operating room on 11/05/2021 and underwent  Procedure(s): RIGHT TOTAL KNEE ARTHROPLASTY.    Patient was given perioperative antibiotics:  Anti-infectives (From admission, onward)    Start     Dose/Rate Route Frequency Ordered Stop   11/05/21 1500  nitrofurantoin (macrocrystal-monohydrate) (MACROBID) capsule 100 mg        100 mg Oral Daily 11/05/21 1103     11/05/21 1400  ceFAZolin (ANCEF) IVPB 2g/100 mL premix        2 g 200 mL/hr over 30 Minutes Intravenous Every 6 hours 11/05/21 1103 11/05/21 2131   11/05/21 0600  ceFAZolin (ANCEF) IVPB 3g/100 mL premix        3 g 200 mL/hr over 30 Minutes Intravenous On call to O.R. 11/05/21 0532 11/05/21 0756        Patient was given sequential compression devices, early ambulation, and chemoprophylaxis to prevent DVT.  Patient benefited maximally from hospital stay and there were no complications.    Recent vital signs: Patient Vitals for the past 24 hrs:  BP Temp Temp src Pulse Resp SpO2 Height Weight  11/06/21 1040 107/63 98 F (36.7 C) Oral (!) 57 17 96 % -- --  11/06/21 0636 (!) 101/53 97.9 F (36.6 C) Oral (!) 54 18 98 % -- --  11/06/21 0233 116/66 97.6 F (36.4 C) Oral (!) 55 18 95 % -- --  11/05/21 2211 110/61 98.7 F (37.1 C) Oral (!) 58 18 98 % -- --  11/05/21 1747 122/63 (!) 97.5 F (36.4 C) -- (!) 57 18 100 % -- --  11/05/21 1323 134/78 (!) 97.5 F (36.4 C) -- 66 18 100 % -- --  11/05/21 1128 -- -- -- -- -- -- 5\' 5"  (1.651 m) 122.6 kg  11/05/21 1104 (!) 145/95 97.8 F (36.6 C) Oral (!) 52 16 100 % -- --  11/05/21 1045 (!) 152/97 (!) 97.5 F (36.4 C) -- 69 19 100 % -- --     Recent laboratory studies: No results for input(s): "WBC", "HGB", "HCT", "PLT", "NA", "K", "CL", "CO2", "BUN", "CREATININE", "GLUCOSE", "INR", "CALCIUM" in the last 72 hours.  Invalid input(s): "PT",  "2"   Discharge Medications:   Allergies as of 11/06/2021       Reactions   Sulfa Antibiotics Hives        Medication List     STOP taking these medications    enoxaparin 120 MG/0.8ML injection Commonly known as: LOVENOX       TAKE these medications    acetaminophen 500 MG tablet Commonly known as: TYLENOL Take 1,000-1,500 mg by mouth every 8 (eight) hours as needed for moderate pain.   albuterol 108 (90 Base) MCG/ACT inhaler Commonly known as: VENTOLIN HFA Inhale 2 puffs into the lungs every 6 (six) hours as needed for wheezing or shortness of breath.   cloNIDine 0.3 MG tablet Commonly known as: CATAPRES Take 0.3 mg by mouth at bedtime.   gabapentin 300 MG capsule Commonly known as: NEURONTIN Take 300-600 mg by mouth See admin instructions. Taking 2 capsules (600 mg) in the am and 300 mg at bedtime   ipratropium 0.03 % nasal spray Commonly known as: ATROVENT Place 2 sprays into both nostrils at bedtime as needed for allergies.   lamoTRIgine 150 MG tablet Commonly known as: LAMICTAL Take 150 mg by mouth daily after breakfast.   metFORMIN 500 MG tablet Commonly known as: GLUCOPHAGE Take 500 mg by mouth 2 (two) times daily with a meal.   nitrofurantoin (macrocrystal-monohydrate) 100 MG capsule Commonly known as: MACROBID Take 100 mg by mouth daily.   ondansetron 4 MG disintegrating tablet Commonly known as: ZOFRAN-ODT 4mg  ODT q4 hours prn nausea/vomit   oxyCODONE-acetaminophen 5-325 MG tablet Commonly known as: Percocet Take 1-2 tablets by mouth every 6 (six) hours as needed for severe pain or moderate pain (post op pain).   oxymetazoline 0.05 % nasal spray Commonly known as: AFRIN Place 1 spray into both nostrils 2 (two) times daily as needed for congestion.   rivaroxaban 20 MG Tabs tablet Commonly known as: XARELTO Take 1 tablet (20 mg total) by mouth daily with supper.   rOPINIRole 4 MG tablet Commonly known as: REQUIP Take 12 mg by mouth at  bedtime.   tiZANidine 4 MG tablet Commonly known as: Zanaflex Take 1 tablet (4 mg total) by mouth every 6 (six) hours as needed for muscle spasms.   traMADol 50 MG tablet Commonly known as: ULTRAM Take 50 mg by mouth 2 (two) times daily as needed for moderate pain.   Trelegy Ellipta 200-62.5-25 MCG/ACT Aepb Generic drug: Fluticasone-Umeclidin-Vilant Inhale 1 puff into the lungs daily.   triamcinolone 0.1 % paste Commonly known as: KENALOG Use as directed 1 application  in the mouth or throat 2 (two) times daily as needed for mouth pain.   Trintellix 20 MG Tabs tablet Generic drug: vortioxetine HBr Take 20 mg by  mouth daily.   Vitamin D (Ergocalciferol) 1.25 MG (50000 UNIT) Caps capsule Commonly known as: DRISDOL Take 50,000 Units by mouth once a week.               Durable Medical Equipment  (From admission, onward)           Start     Ordered   11/05/21 1104  DME Walker rolling  Once       Question:  Patient needs a walker to treat with the following condition  Answer:  Primary osteoarthritis of right knee   11/05/21 1103   11/05/21 1104  DME 3 n 1  Once        11/05/21 1103   11/05/21 1104  DME Bedside commode  Once       Question:  Patient needs a bedside commode to treat with the following condition  Answer:  Primary osteoarthritis of right knee   11/05/21 1103            Diagnostic Studies: DG Chest 2 View  Result Date: 10/30/2021 CLINICAL DATA:  Preoperative study prior to total knee replacement EXAM: CHEST - 2 VIEW COMPARISON:  June 05, 2021 FINDINGS: The heart size and mediastinal contours are within normal limits. Both lungs are clear. The visualized skeletal structures are unremarkable. IMPRESSION: No active cardiopulmonary disease. Electronically Signed   By: Dorise Bullion III M.D.   On: 10/30/2021 15:52    Disposition: Discharge disposition: 01-Home or Self Care       Discharge Instructions     Call MD / Call 911   Complete by: As  directed    If you experience chest pain or shortness of breath, CALL 911 and be transported to the hospital emergency room.  If you develope a fever above 101 F, pus (white drainage) or increased drainage or redness at the wound, or calf pain, call your surgeon's office.   Constipation Prevention   Complete by: As directed    Drink plenty of fluids.  Prune juice may be helpful.  You may use a stool softener, such as Colace (over the counter) 100 mg twice a day.  Use MiraLax (over the counter) for constipation as needed.   Diet - low sodium heart healthy   Complete by: As directed    Discharge instructions   Complete by: As directed    INSTRUCTIONS AFTER JOINT REPLACEMENT   Remove items at home which could result in a fall. This includes throw rugs or furniture in walking pathways ICE to the affected joint every three hours while awake for 30 minutes at a time, for at least the first 3-5 days, and then as needed for pain and swelling.  Continue to use ice for pain and swelling. You may notice swelling that will progress down to the foot and ankle.  This is normal after surgery.  Elevate your leg when you are not up walking on it.   Continue to use the breathing machine you got in the hospital (incentive spirometer) which will help keep your temperature down.  It is common for your temperature to cycle up and down following surgery, especially at night when you are not up moving around and exerting yourself.  The breathing machine keeps your lungs expanded and your temperature down.   DIET:  As you were doing prior to hospitalization, we recommend a well-balanced diet.  DRESSING / WOUND CARE / SHOWERING  You may shower 3 days after surgery, but keep the wounds dry  during showering.  You may use an occlusive plastic wrap (Press'n Seal for example), NO SOAKING/SUBMERGING IN THE BATHTUB.  If the bandage gets wet, change with a clean dry gauze.  If the incision gets wet, pat the wound dry with a  clean towel.  ACTIVITY  Increase activity slowly as tolerated, but follow the weight bearing instructions below.   No driving for 6 weeks or until further direction given by your physician.  You cannot drive while taking narcotics.  No lifting or carrying greater than 10 lbs. until further directed by your surgeon. Avoid periods of inactivity such as sitting longer than an hour when not asleep. This helps prevent blood clots.  You may return to work once you are authorized by your doctor.     WEIGHT BEARING   Weight bearing as tolerated with assist device (walker, cane, etc) as directed, use it as long as suggested by your surgeon or therapist, typically at least 4-6 weeks.   EXERCISES  Results after joint replacement surgery are often greatly improved when you follow the exercise, range of motion and muscle strengthening exercises prescribed by your doctor. Safety measures are also important to protect the joint from further injury. Any time any of these exercises cause you to have increased pain or swelling, decrease what you are doing until you are comfortable again and then slowly increase them. If you have problems or questions, call your caregiver or physical therapist for advice.   Rehabilitation is important following a joint replacement. After just a few days of immobilization, the muscles of the leg can become weakened and shrink (atrophy).  These exercises are designed to build up the tone and strength of the thigh and leg muscles and to improve motion. Often times heat used for twenty to thirty minutes before working out will loosen up your tissues and help with improving the range of motion but do not use heat for the first two weeks following surgery (sometimes heat can increase post-operative swelling).   These exercises can be done on a training (exercise) mat, on the floor, on a table or on a bed. Use whatever works the best and is most comfortable for you.    Use music or  television while you are exercising so that the exercises are a pleasant break in your day. This will make your life better with the exercises acting as a break in your routine that you can look forward to.   Perform all exercises about fifteen times, three times per day or as directed.  You should exercise both the operative leg and the other leg as well.  Exercises include:   Quad Sets - Tighten up the muscle on the front of the thigh (Quad) and hold for 5-10 seconds.   Straight Leg Raises - With your knee straight (if you were given a brace, keep it on), lift the leg to 60 degrees, hold for 3 seconds, and slowly lower the leg.  Perform this exercise against resistance later as your leg gets stronger.  Leg Slides: Lying on your back, slowly slide your foot toward your buttocks, bending your knee up off the floor (only go as far as is comfortable). Then slowly slide your foot back down until your leg is flat on the floor again.  Angel Wings: Lying on your back spread your legs to the side as far apart as you can without causing discomfort.  Hamstring Strength:  Lying on your back, push your heel against the floor with  your leg straight by tightening up the muscles of your buttocks.  Repeat, but this time bend your knee to a comfortable angle, and push your heel against the floor.  You may put a pillow under the heel to make it more comfortable if necessary.   A rehabilitation program following joint replacement surgery can speed recovery and prevent re-injury in the future due to weakened muscles. Contact your doctor or a physical therapist for more information on knee rehabilitation.    CONSTIPATION  Constipation is defined medically as fewer than three stools per week and severe constipation as less than one stool per week.  Even if you have a regular bowel pattern at home, your normal regimen is likely to be disrupted due to multiple reasons following surgery.  Combination of anesthesia,  postoperative narcotics, change in appetite and fluid intake all can affect your bowels.   YOU MUST use at least one of the following options; they are listed in order of increasing strength to get the job done.  They are all available over the counter, and you may need to use some, POSSIBLY even all of these options:    Drink plenty of fluids (prune juice may be helpful) and high fiber foods Colace 100 mg by mouth twice a day  Senokot for constipation as directed and as needed Dulcolax (bisacodyl), take with full glass of water  Miralax (polyethylene glycol) once or twice a day as needed.  If you have tried all these things and are unable to have a bowel movement in the first 3-4 days after surgery call either your surgeon or your primary doctor.    If you experience loose stools or diarrhea, hold the medications until you stool forms back up.  If your symptoms do not get better within 1 week or if they get worse, check with your doctor.  If you experience "the worst abdominal pain ever" or develop nausea or vomiting, please contact the office immediately for further recommendations for treatment.   ITCHING:  If you experience itching with your medications, try taking only a single pain pill, or even half a pain pill at a time.  You can also use Benadryl over the counter for itching or also to help with sleep.   TED HOSE STOCKINGS:  Use stockings on both legs until for at least 2 weeks or as directed by physician office. They may be removed at night for sleeping.  MEDICATIONS:  See your medication summary on the "After Visit Summary" that nursing will review with you.  You may have some home medications which will be placed on hold until you complete the course of blood thinner medication.  It is important for you to complete the blood thinner medication as prescribed.  PRECAUTIONS:  If you experience chest pain or shortness of breath - call 911 immediately for transfer to the hospital emergency  department.   If you develop a fever greater that 101 F, purulent drainage from wound, increased redness or drainage from wound, foul odor from the wound/dressing, or calf pain - CONTACT YOUR SURGEON.                                                   FOLLOW-UP APPOINTMENTS:  If you do not already have a post-op appointment, please call the office for an appointment to be seen  by your surgeon.  Guidelines for how soon to be seen are listed in your "After Visit Summary", but are typically between 1-4 weeks after surgery.  OTHER INSTRUCTIONS:   Knee Replacement:  Do not place pillow under knee, focus on keeping the knee straight while resting. CPM instructions: 0-90 degrees, 2 hours in the morning, 2 hours in the afternoon, and 2 hours in the evening. Place foam block, curve side up under heel at all times except when in CPM or when walking.  DO NOT modify, tear, cut, or change the foam block in any way.  POST-OPERATIVE OPIOID TAPER INSTRUCTIONS: It is important to wean off of your opioid medication as soon as possible. If you do not need pain medication after your surgery it is ok to stop day one. Opioids include: Codeine, Hydrocodone(Norco, Vicodin), Oxycodone(Percocet, oxycontin) and hydromorphone amongst others.  Long term and even short term use of opiods can cause: Increased pain response Dependence Constipation Depression Respiratory depression And more.  Withdrawal symptoms can include Flu like symptoms Nausea, vomiting And more Techniques to manage these symptoms Hydrate well Eat regular healthy meals Stay active Use relaxation techniques(deep breathing, meditating, yoga) Do Not substitute Alcohol to help with tapering If you have been on opioids for less than two weeks and do not have pain than it is ok to stop all together.  Plan to wean off of opioids This plan should start within one week post op of your joint replacement. Maintain the same interval or time between taking  each dose and first decrease the dose.  Cut the total daily intake of opioids by one tablet each day Next start to increase the time between doses. The last dose that should be eliminated is the evening dose.     MAKE SURE YOU:  Understand these instructions.  Get help right away if you are not doing well or get worse.    Thank you for letting us be a part of your medical care team.  It is a privilege we respect greatly.  We hope these instructions will help you stay on track for a fast and full recovery!   Increase activity slowly as tolerated   Complete by: As directed    Post-operative opioid taper instructions:   Complete by: As directed    POST-OPERATIVE OPIOID TAPER INSTRUCTIONS: It is important to wean off of your opioid medication as soon as possible. If you do not need pain medication after your surgery it is ok to stop day one. Opioids include: Codeine, Hydrocodone(Norco, Vicodin), Oxycodone(Percocet, oxycontin) and hydromorphone amongst others.  Long term and even short term use of opiods can cause: Increased pain response Dependence Constipation Depression Respiratory depression And more.  Withdrawal symptoms can include Flu like symptoms Nausea, vomiting And more Techniques to manage these symptoms Hydrate well Eat regular healthy meals Stay active Use relaxation techniques(deep breathing, meditating, yoga) Do Not substitute Alcohol to help with tapering If you have been on opioids for less than two weeks and do not have pain than it is ok to stop all together.  Plan to wean off of opioids This plan should start within one week post op of your joint replacement. Maintain the same interval or time between taking each dose and first decrease the dose.  Cut the total daily intake of opioids by one tablet each day Next start to increase the time between doses. The last dose that should be eliminated is the evening dose.  Follow-up Information      Melrose Nakayama, MD. Schedule an appointment as soon as possible for a visit in 2 week(s).   Specialty: Orthopedic Surgery Contact information: [place] [address] [place], [place] Follow up.   Specialty: [place] Why: providing home physical therapy visits Contact information: [address] [place] [address] [phone]                  Signed: Larwance Sachs Shylo Zamor 11/06/2021, 10:44 AM

## 2021-11-06 NOTE — Progress Notes (Signed)
Physical Therapy Treatment Patient Details Name: Erika Woods MRN: [id number] DOB: 1966-02-02 Today's Date: 11/06/2021   History of Present Illness Pt is a 56 year old female s/p R TKA on 11/05/21.  PMH significant of recurrent pulmonary embolism, diabetes, degenerative disc disease, depression, OSA, thoracic aortic aneurysm    PT Comments    Pt ambulated 130' with RW, no loss of balance. Stair training completed. Pt demonstrates good understanding of HEP. She is ready to DC home from a PT standpoint.    Recommendations for follow up therapy are one component of a multi-disciplinary discharge planning process, led by the attending physician.  Recommendations may be updated based on patient status, additional functional criteria and insurance authorization.  Follow Up Recommendations  Follow physician's recommendations for discharge plan and follow up therapies     Assistance Recommended at Discharge PRN  Patient can return home with the following A little help with walking and/or transfers;A little help with bathing/dressing/bathroom;Help with stairs or ramp for entrance   Equipment Recommendations  None recommended by PT    Recommendations for Other Services       Precautions / Restrictions Precautions Precautions: Knee;Fall Restrictions Weight Bearing Restrictions: No RLE Weight Bearing: Weight bearing as tolerated     Mobility  Bed Mobility Overal bed mobility: Modified Independent       Supine to sit: Modified independent (Device/Increase time), HOB elevated     General bed mobility comments: increased time and effort, cues for self assist    Transfers Overall transfer level: Needs assistance Equipment used: Rolling walker (2 wheels) Transfers: Sit to/from Stand Sit to Stand: Supervision           General transfer comment: verbal cues for UE and LE positioning    Ambulation/Gait Ambulation/Gait assistance: Supervision Gait Distance (Feet): 130  Feet Assistive device: Rolling walker (2 wheels) Gait Pattern/deviations: Step-to pattern, Decreased stance time - right, Antalgic Gait velocity: decr     General Gait Details: verbal cues for sequence, RW positioning   Stairs Stairs: Yes Stairs assistance: Min guard Stair Management: One rail Left, Step to pattern, Forwards, With cane Number of Stairs: 3 General stair comments: VCs sequencing   Wheelchair Mobility    Modified Rankin (Stroke Patients Only)       Balance Overall balance assessment: Modified Independent                                          Cognition Arousal/Alertness: Awake/alert Behavior During Therapy: WFL for tasks assessed/performed Overall Cognitive Status: Within Functional Limits for tasks assessed                                          Exercises Total Joint Exercises Ankle Circles/Pumps: AROM, Both, 10 reps Quad Sets: AROM, Right, 5 reps, Supine Short Arc Quad: AROM, Right, 5 reps, Supine Heel Slides: AAROM, Right, 5 reps, Supine Hip ABduction/ADduction: AROM, Right, 5 reps, Supine Straight Leg Raises: AROM, Right, 5 reps, Supine Long Arc Quad: AROM, Right, 5 reps, Seated Knee Flexion: AAROM, Right, 10 reps, Seated Goniometric ROM: 0-50* AAROM R knee    General Comments        Pertinent Vitals/Pain Pain Assessment Pain Score: 8  Pain Location: right knee Pain Descriptors / Indicators: Sore, Aching Pain Intervention(s): Limited activity  within patient's tolerance, Monitored during session, Premedicated before session, Ice applied    Home Living                          Prior Function            PT Goals (current goals can now be found in the care plan section) Acute Rehab PT Goals PT Goal Formulation: With patient Time For Goal Achievement: 11/09/21 Potential to Achieve Goals: Good Progress towards PT goals: Progressing toward goals    Frequency    7X/week      PT  Plan Current plan remains appropriate    Co-evaluation              AM-PAC PT "6 Clicks" Mobility   Outcome Measure  Help needed turning from your back to your side while in a flat bed without using bedrails?: A Little Help needed moving from lying on your back to sitting on the side of a flat bed without using bedrails?: A Little Help needed moving to and from a bed to a chair (including a wheelchair)?: None Help needed standing up from a chair using your arms (e.g., wheelchair or bedside chair)?: None Help needed to walk in hospital room?: None Help needed climbing 3-5 steps with a railing? : A Little 6 Click Score: 21    End of Session Equipment Utilized During Treatment: Gait belt Activity Tolerance: Patient tolerated treatment well Patient left: in chair;with call bell/phone within reach;with chair alarm set Nurse Communication: Mobility status PT Visit Diagnosis: Other abnormalities of gait and mobility (R26.89)     Time: 2297-9892 PT Time Calculation (min) (ACUTE ONLY): 36 min  Charges:  $Gait Training: 8-22 mins $Therapeutic Exercise: 8-22 mins                    Ralene Bathe Kistler PT 11/06/2021  Acute Rehabilitation Services  Office [phone]

## 2021-11-06 NOTE — Plan of Care (Signed)
Pt ready to DC home with walker and family.

## 2021-12-16 ENCOUNTER — Ambulatory Visit: Payer: Commercial Managed Care - PPO | Admitting: Pulmonary Disease

## 2021-12-17 ENCOUNTER — Encounter: Payer: Self-pay | Admitting: Pulmonary Disease

## 2021-12-17 ENCOUNTER — Ambulatory Visit (INDEPENDENT_AMBULATORY_CARE_PROVIDER_SITE_OTHER): Payer: Commercial Managed Care - PPO | Admitting: Pulmonary Disease

## 2021-12-17 VITALS — BP 126/68 | HR 76 | Temp 98.7°F | Ht 65.0 in | Wt 275.0 lb

## 2021-12-17 DIAGNOSIS — I2699 Other pulmonary embolism without acute cor pulmonale: Secondary | ICD-10-CM

## 2021-12-17 NOTE — Progress Notes (Signed)
@Patient  ID: Erika Woods, female    DOB: Oct 05, 1965, 56 y.o.   MRN: [id number]  Chief Complaint  Patient presents with   Follow-up    Pt is here for follow up for her pulmonary embolism. Pt states she just recently had her knee surgery. Pt is still continuing her Xarelto. NO side effects noted so far with patient her blood thinner    Referring provider: [name], MD  HPI:   56 y.o. woman who is here for follow up of dyspnea on exertion and hypoxemic respiratory failure.  Hypoxemic respiratory failure now resolved.  Recent discharge summary for knee replacement reviewed.  Overall doing well.  Knee surgery well.  No issues with bleeding.  Smooth transition with Lovenox bridge and resumption of DOAC after surgery.  Back to driving now just a few days.  Doing rehab as instructed.  Her dyspnea is not a problem.  Improved.  Seem to be improving with increase in activity.  No issues  HPI at initial visit: Patient with multiple pulmonary embolus.  Most recently fall 2022.  Submassive with evidence of right heart strain.  Repeat echocardiogram 04/2021 reviewed and normalization of RV size and pressure was reported.  This is reassuring.  At recent pulmonary visit, she is noted to desaturate with exertion.  She was ordered POC home oxygen.  This is yet to be delivered despite being ordered a couple weeks ago.  He has been checking at home and he states lowest he gets 91 to 92%.  Notably 90%.  This is with exertion.  She recovers to mid 90s when she rests.  PFTs performed today.  These were reviewed in detail with the patient.  Spirometry suggestive of mild restriction versus air trapping.  TLC within normal limits, no restriction present although just above lower limit of normal with severely decreased ERV suggestive of extraparenchymal restriction related to habitus.  Though not meeting criteria RV/TLC ratio 115% suggestive of mild gas trapping, DLCO within normal limits.  Most recent CT scan  05/2021 reviewed and interpreted as clear lungs with mosaicism scattered throughout, no PE visualized.  PMH: Recurrent pulmonary embolism, Surgical history: Low back surgery, tonsillectomy Family history: Adopted, unknown Social history: Never smoker, lives in [place] / Pulmonary Flowsheets:   ACT:      No data to display          MMRC:     No data to display          Epworth:      No data to display          Tests:   FENO:  No results found for: "NITRICOXIDE"  PFT:    Latest Ref Rng & Units 07/19/2021    9:01 AM  PFT Results  FVC-Pre L 2.39   FVC-Predicted Pre % 70   FVC-Post L 2.33   FVC-Predicted Post % 68   Pre FEV1/FVC % % 87   Post FEV1/FCV % % 90   FEV1-Pre L 2.07   FEV1-Predicted Pre % 77   FEV1-Post L 2.10   DLCO uncorrected ml/min/mmHg 19.57   DLCO UNC% % 95   DLCO corrected ml/min/mmHg 19.22   DLCO COR %Predicted % 93   DLVA Predicted % 122   TLC L 4.15   TLC % Predicted % 82   RV % Predicted % 94   Personally reviewed and interpreted as primary suggestive of mild restriction versus air trapping, no bronchodilator response.  TLC within  normal limits, ERV severely low, RV/TLC ratio borderline but not diagnostic for air trapping 115%, DLCO within normal limits, altogether normal PFTs with subtle suggestions of extraparenchymal restriction from habitus as well as air trapping  WALK:     07/04/2021    4:00 PM  SIX MIN WALK  Supplimental Oxygen during Test? (L/min) Yes  O2 Flow Rate 2 L/min  Type Continuous    Imaging: Personally reviewed No results found.  Lab Results: Personally reviewed CBC    Component Value Date/Time   WBC 4.9 10/30/2021 0928   RBC 4.39 10/30/2021 0928   HGB 12.5 10/30/2021 0928   HGB 12.4 06/27/2021 1457   HGB 12.5 04/18/2020 1326   HCT 41.0 10/30/2021 0928   HCT 37.7 04/18/2020 1326   PLT 210 10/30/2021 0928   PLT 252 06/27/2021 1457   PLT 265 04/18/2020 1326   MCV 93.4 10/30/2021  0928   MCV 85 04/18/2020 1326   MCH 28.5 10/30/2021 0928   MCHC 30.5 10/30/2021 0928   RDW 14.7 10/30/2021 0928   RDW 13.4 04/18/2020 1326   LYMPHSABS 2.3 06/27/2021 1457   MONOABS 0.4 06/27/2021 1457   EOSABS 0.5 06/27/2021 1457   BASOSABS 0.1 06/27/2021 1457    BMET    Component Value Date/Time   NA 140 10/30/2021 0928   NA 141 04/18/2020 1326   K 4.0 10/30/2021 0928   CL 106 10/30/2021 0928   CO2 27 10/30/2021 0928   GLUCOSE 86 10/30/2021 0928   BUN 17 10/30/2021 0928   BUN 15 04/18/2020 1326   CREATININE 0.90 10/30/2021 0928   CREATININE 1.13 (H) 06/27/2021 1457   CREATININE 0.98 09/25/2015 0837   CALCIUM 9.2 10/30/2021 0928   GFRNONAA >60 10/30/2021 0928   GFRNONAA 57 (L) 06/27/2021 1457   GFRNONAA 67 09/25/2015 0837   GFRAA 66 04/18/2020 1326   GFRAA 77 09/25/2015 0837    BNP    Component Value Date/Time   BNP 103.1 (H) 10/05/2021 1733    ProBNP No results found for: "PROBNP"  Specialty Problems       Pulmonary Problems   Sleep apnea    Allergies  Allergen Reactions   Sulfa Antibiotics Hives   Keflex [Cephalexin] Itching    Immunization History  Administered Date(s) Administered   Hep A / Hep B 06/28/2018, 07/28/2018, 12/21/2018   Influenza,inj,Quad PF,6+ Mos 03/14/2019, 03/21/2019, 02/19/2021   Influenza-Unspecified 01/12/2018   PFIZER(Purple Top)SARS-COV-2 Vaccination 09/20/2019, 10/13/2019   Pneumococcal Polysaccharide-23 02/26/2010    Past Medical History:  Diagnosis Date   Acute pulmonary embolism ([name]) 11/11/2018   Acute pulmonary embolism with acute cor pulmonale ([name]) 09/16/2017   Acute superficial venous thrombosis of left lower extremity    Aortic insufficiency    Echocardiogram 08/2019: prob bicuspid AoV, mild to mod AI, mild AS (mean 12 mmHg), EF 55-60, no RWMA, Gr 1 DD, GLS -22.2%, normal RVSF, Ao Root 39 mm   Coronary CTA    Coronary CTA 08/2019: Calcium score 0, no evidence of CAD, ascending aorta 4 cm   Diabetes mellitus  without complication ([name])    DM (diabetes mellitus) ([name]) 08/18/2011   GERD (gastroesophageal reflux disease)    Heart murmur    History of kidney stones    Impaired fasting glucose    Mood disorder ([name]) 08/18/2011   Obesity, Class III, BMI 40-49.9 (morbid obesity) ([name]) 09/16/2017   Obstructive sleep apnea on CPAP    Osteoarthritis of knee    Personal history of pulmonary embolism  PTSD (post-traumatic stress disorder) 08/18/2011   Pulmonary emboli ([place]) 09/15/2017   Renal disorder    kideny stones   Restless leg syndrome    Severe recurrent major depression without psychotic features ([place]) 02/17/2018   Sleep apnea 08/18/2011   Thoracic aortic aneurysm [facility])    Thoracic aortic aneurysm without rupture ([name])    Urinary tract infection    hx of chronic uti per pt   UTI (urinary tract infection)    recurrent    Tobacco History: Social History   Tobacco Use  Smoking Status Never  Smokeless Tobacco Never   Counseling given: Not Answered   Continue to not smoke  Outpatient Encounter Medications as of 12/17/2021  Medication Sig   acetaminophen (TYLENOL) 500 MG tablet Take 1,000-1,500 mg by mouth every 8 (eight) hours as needed for moderate pain.   albuterol (VENTOLIN HFA) 108 (90 Base) MCG/ACT inhaler Inhale 2 puffs into the lungs every 6 (six) hours as needed for wheezing or shortness of breath.   cloNIDine (CATAPRES) 0.3 MG tablet Take 0.3 mg by mouth at bedtime.   Fluticasone-Umeclidin-Vilant (TRELEGY ELLIPTA) 200-62.5-25 MCG/ACT AEPB Inhale 1 puff into the lungs daily.   gabapentin (NEURONTIN) 300 MG capsule Take 300-600 mg by mouth See admin instructions. Taking 2 capsules (600 mg) in the am and 300 mg at bedtime   ipratropium (ATROVENT) 0.03 % nasal spray Place 2 sprays into both nostrils at bedtime as needed for allergies.   lamoTRIgine (LAMICTAL) 150 MG tablet Take 150 mg by mouth daily after breakfast.    metFORMIN (GLUCOPHAGE) 500 MG tablet Take 500 mg by mouth 2  (two) times daily with a meal.    ondansetron (ZOFRAN-ODT) 4 MG disintegrating tablet 4mg  ODT q4 hours prn nausea/vomit   oxyCODONE-acetaminophen (PERCOCET) 5-325 MG tablet Take 1-2 tablets by mouth every 6 (six) hours as needed for severe pain or moderate pain (post op pain).   oxymetazoline (AFRIN) 0.05 % nasal spray Place 1 spray into both nostrils 2 (two) times daily as needed for congestion.   rivaroxaban (XARELTO) 20 MG TABS tablet Take 1 tablet (20 mg total) by mouth daily with supper.   rOPINIRole (REQUIP) 4 MG tablet Take 12 mg by mouth at bedtime.   traMADol (ULTRAM) 50 MG tablet Take 50 mg by mouth 2 (two) times daily as needed for moderate pain.   triamcinolone (KENALOG) 0.1 % paste Use as directed 1 application  in the mouth or throat 2 (two) times daily as needed for mouth pain.   TRINTELLIX 20 MG TABS tablet Take 20 mg by mouth daily.   Vitamin D, Ergocalciferol, (DRISDOL) 1.25 MG (50000 UNIT) CAPS capsule Take 50,000 Units by mouth once a week.   [DISCONTINUED] nitrofurantoin, macrocrystal-monohydrate, (MACROBID) 100 MG capsule Take 100 mg by mouth daily.   [DISCONTINUED] tiZANidine (ZANAFLEX) 4 MG tablet Take 1 tablet (4 mg total) by mouth every 6 (six) hours as needed for muscle spasms.   No facility-administered encounter medications on file as of 12/17/2021.     Review of Systems  Review of Systems  N/A Physical Exam  BP 126/68 (BP Location: Left Arm, Patient Position: Sitting, Cuff Size: Normal)   Pulse 76   Temp 98.7 F (37.1 C) (Oral)   Ht 5\' 5"  (1.651 m)   Wt 275 lb (124.7 kg)   LMP 01/29/2013   SpO2 99%   BMI 45.76 kg/m   Wt Readings from Last 5 Encounters:  12/17/21 275 lb (124.7 kg)  11/05/21 270 lb 3.1  oz (122.6 kg)  10/30/21 270 lb (122.5 kg)  10/05/21 270 lb (122.5 kg)  09/12/21 280 lb 12.8 oz (127.4 kg)    BMI Readings from Last 5 Encounters:  12/17/21 45.76 kg/m  11/05/21 44.96 kg/m  10/30/21 44.93 kg/m  10/05/21 44.93 kg/m  09/12/21  46.73 kg/m     Physical Exam General: Well-appearing, no acute distress Eyes: EOMI, no icterus Neck: Supple, JVP Pulmonary: Clear, normal work of breathing Cardiovascular: Regular rhythm, 1/6 early systolic murmur Abdomen: Nondistended, bowel sounds present MSK: No synovitis, no effusion Neuro: Normal gait, no weakness Psych: Normal mood, full affect   Assessment & Plan:   Dyspnea on exertion: Likely multifactorial related to deconditioning, valvular insufficiency, possible OHS given recently elevated bicarbonate, reactive allergy/asthma given mosaicism and borderline gas trapping on PFTs.  PFTs largely reassuring, no parenchymal involvement on imaging nor PFTs.  Trial of Trelegy without improvement.  Improved overall with increased activity after knee surgery likely reflective of main driver of deconditioning..  Hypoxemic respiratory failure, resolved: In the setting of acute PE.  Continues to make adequate oxygen saturations at home and on multiple checks in the hospital recently with knee surgery as well as doctors visits.  Recurrent PE: Continue lifelong anticoagulation.  TTE without signs of elevated right-sided pressures.  Sign of what may be chronic clot on CTA 09/2021.  Consider additional workup for CTEPH if dyspnea were to worsen or become a problem.  Currently improving with more activity.   Return in about 1 year (around 12/18/2022).   Karren Burly, MD 12/17/2021

## 2021-12-17 NOTE — Patient Instructions (Signed)
Nice to see you again  I am glad you are doing well and the surgery went well  No changes to medications  Return to clinic in 1 year or sooner as needed with Dr. Judeth Horn

## 2021-12-19 ENCOUNTER — Other Ambulatory Visit: Payer: Self-pay

## 2021-12-19 ENCOUNTER — Inpatient Hospital Stay (HOSPITAL_BASED_OUTPATIENT_CLINIC_OR_DEPARTMENT_OTHER): Payer: Commercial Managed Care - PPO | Admitting: Hematology and Oncology

## 2021-12-19 ENCOUNTER — Other Ambulatory Visit: Payer: Self-pay | Admitting: Hematology and Oncology

## 2021-12-19 ENCOUNTER — Inpatient Hospital Stay: Payer: Commercial Managed Care - PPO | Attending: Hematology and Oncology

## 2021-12-19 VITALS — BP 133/85 | HR 88 | Temp 98.2°F | Resp 15 | Wt 277.2 lb

## 2021-12-19 DIAGNOSIS — I2782 Chronic pulmonary embolism: Secondary | ICD-10-CM

## 2021-12-19 DIAGNOSIS — Z7901 Long term (current) use of anticoagulants: Secondary | ICD-10-CM | POA: Insufficient documentation

## 2021-12-19 DIAGNOSIS — I2609 Other pulmonary embolism with acute cor pulmonale: Secondary | ICD-10-CM | POA: Diagnosis not present

## 2021-12-19 DIAGNOSIS — I2699 Other pulmonary embolism without acute cor pulmonale: Secondary | ICD-10-CM | POA: Insufficient documentation

## 2021-12-19 LAB — CBC WITH DIFFERENTIAL (CANCER CENTER ONLY)
Abs Immature Granulocytes: 0.02 10*3/uL (ref 0.00–0.07)
Basophils Absolute: 0.1 10*3/uL (ref 0.0–0.1)
Basophils Relative: 1 %
Eosinophils Absolute: 0.3 10*3/uL (ref 0.0–0.5)
Eosinophils Relative: 4 %
HCT: 37.9 % (ref 36.0–46.0)
Hemoglobin: 11.7 g/dL — ABNORMAL LOW (ref 12.0–15.0)
Immature Granulocytes: 0 %
Lymphocytes Relative: 25 %
Lymphs Abs: 1.8 10*3/uL (ref 0.7–4.0)
MCH: 27.4 pg (ref 26.0–34.0)
MCHC: 30.9 g/dL (ref 30.0–36.0)
MCV: 88.8 fL (ref 80.0–100.0)
Monocytes Absolute: 0.4 10*3/uL (ref 0.1–1.0)
Monocytes Relative: 5 %
Neutro Abs: 4.6 10*3/uL (ref 1.7–7.7)
Neutrophils Relative %: 65 %
Platelet Count: 299 10*3/uL (ref 150–400)
RBC: 4.27 MIL/uL (ref 3.87–5.11)
RDW: 14.7 % (ref 11.5–15.5)
WBC Count: 7.3 10*3/uL (ref 4.0–10.5)
nRBC: 0 % (ref 0.0–0.2)

## 2021-12-19 LAB — CMP (CANCER CENTER ONLY)
ALT: 10 U/L (ref 0–44)
AST: 13 U/L — ABNORMAL LOW (ref 15–41)
Albumin: 4.2 g/dL (ref 3.5–5.0)
Alkaline Phosphatase: 101 U/L (ref 38–126)
Anion gap: 5 (ref 5–15)
BUN: 18 mg/dL (ref 6–20)
CO2: 30 mmol/L (ref 22–32)
Calcium: 9.2 mg/dL (ref 8.9–10.3)
Chloride: 105 mmol/L (ref 98–111)
Creatinine: 0.9 mg/dL (ref 0.44–1.00)
GFR, Estimated: >60 mL/min (ref >60)
Glucose, Bld: 97 mg/dL (ref 70–99)
Potassium: 4.2 mmol/L (ref 3.5–5.1)
Sodium: 140 mmol/L (ref 135–145)
Total Bilirubin: 0.5 mg/dL (ref 0.3–1.2)
Total Protein: 7.3 g/dL (ref 6.5–8.1)

## 2021-12-19 NOTE — Progress Notes (Signed)
[facility] [phone]   [phone]  PROGRESS NOTE  Patient Care Team: Dois Davenport, MD as PCP - General (Family Medicine) Kathleene Hazel, MD as PCP - Cardiology (Cardiology)  Hematological/Oncological History # Recurrent Pulmonary Emboli  09/15/2017: CT PE study showed acute pulmonary embolus is noted in right main pulmonary artery and lower lobe branches. Positive for acute PE with CT evidence of right heart strain (RV/LV Ratio = 1.0) 11/11/2018: CT PE study showed acute pulmonary embolus affecting all lobes in the right lung. No central or saddle embolus 11/12/2018: Hypercoagluation studies ordered.  11/09/2020: CT PE study showed bilateral pulmonary emboli are noted, with large embolus seen in the distal right pulmonary artery which extends into the lower lobe branches. Positive for acute PE with CT evidence of right heart strain (RV/LV Ratio = 1.2) consistent with at least submassive (intermediate risk) PE. 02/18/2021: CT PE study showed nearly occlusive, lobar to segmental embolus throughout the right lung. No embolus identified in the left lung. Also noted to have concern for right heart strain 05/29/2021: NM pulmonary perfusion test negative  06/05/2021: CT Angio Chest showed no evidence of VTE 06/27/2021: establish care with Dr. Leonides Schanz   Interval History:  Erika Woods 56 y.o. female with medical history significant for recurrent pulmonary emboli who presents for a follow up visit. The patient's last visit was on 06/27/2021 at which time she established care. In the interim since the last visit she underwent a knee surgery without complication.  Today Ms. Mchale reports that she is recovering well from her right total knee arthroplasty.  She underwent this procedure on 11/05/2021.  She notes that she did fine with the procedure after bridging with Lovenox for 2 days before the procedure and starting back on her Xarelto the night after the  procedure.  She does that she is healing up quite well.  She continues to take Xarelto 20 mg p.o. daily.  She has not been having any issues with bleeding or bruising.  She has no signs or symptoms concerning for recurrent VTE.  She denies any leg swelling, leg pain, chest pain, shortness of breath.  She reports that she has not been having any difficulties with nausea or vomiting.  She did suffer from a viral bug in May 2023 but is subsequently resolved.  She notes that she has $0 co-pay for Xarelto and has no questions comments or concerns regarding her anticoagulation treatment.  A full 10 point ROS is listed below.  MEDICAL HISTORY:  Past Medical History:  Diagnosis Date   Acute pulmonary embolism ([name]) 11/11/2018   Acute pulmonary embolism with acute cor pulmonale ([name]) 09/16/2017   Acute superficial venous thrombosis of left lower extremity    Aortic insufficiency    Echocardiogram 08/2019: prob bicuspid AoV, mild to mod AI, mild AS (mean 12 mmHg), EF 55-60, no RWMA, Gr 1 DD, GLS -22.2%, normal RVSF, Ao Root 39 mm   Coronary CTA    Coronary CTA 08/2019: Calcium score 0, no evidence of CAD, ascending aorta 4 cm   Diabetes mellitus without complication ([name])    DM (diabetes mellitus) ([name]) 08/18/2011   GERD (gastroesophageal reflux disease)    Heart murmur    History of kidney stones    Impaired fasting glucose    Mood disorder ([name]) 08/18/2011   Obesity, Class III, BMI 40-49.9 (morbid obesity) ([name]) 09/16/2017   Obstructive sleep apnea on CPAP    Osteoarthritis of knee    Personal  history of pulmonary embolism    PTSD (post-traumatic stress disorder) 08/18/2011   Pulmonary emboli ([name]) 09/15/2017   Renal disorder    kideny stones   Restless leg syndrome    Severe recurrent major depression without psychotic features ([name]) 02/17/2018   Sleep apnea 08/18/2011   Thoracic aortic aneurysm ([name])    Thoracic aortic aneurysm without rupture ([name])    Urinary tract infection    hx of  chronic uti per pt   UTI (urinary tract infection)    recurrent    SURGICAL HISTORY: Past Surgical History:  Procedure Laterality Date   arm surgery Bilateral Skin removal   DILATATION & CURETTAGE/HYSTEROSCOPY WITH MYOSURE N/A 06/11/2018   Procedure: DILATATION & CURETTAGE/HYSTEROSCOPY WITH MYOSURE RESECTION OF ENDOMETRIAL THICKENING;  Surgeon: Richardean Chimera, MD;  Location: [facility];  Service: Gynecology;  Laterality: N/A;  BMI 56   DILATION AND EVACUATION     ESOPHAGOGASTRODUODENOSCOPY (EGD) WITH PROPOFOL N/A 07/15/2018   Procedure: ESOPHAGOGASTRODUODENOSCOPY (EGD) WITH PROPOFOL;  Surgeon: Willis Modena, MD;  Location: [facility];  Service: Endoscopy;  Laterality: N/A;   ESOPHAGOGASTRODUODENOSCOPY (EGD) WITH PROPOFOL N/A 04/27/2019   Procedure: ESOPHAGOGASTRODUODENOSCOPY (EGD) WITH PROPOFOL;  Surgeon: Willis Modena, MD;  Location: [facility];  Service: Endoscopy;  Laterality: N/A;   IR ABLATE LIVER CRYOABLATION  10/28/2019   IR ABLATE LIVER CRYOABLATION  05/07/2020   IR RADIOLOGIST EVAL & MGMT  10/14/2019   IR RADIOLOGIST EVAL & MGMT  04/03/2020   KIDNEY STONE SURGERY     KNEE SURGERY Right    LUMBAR LAMINECTOMY/DECOMPRESSION MICRODISCECTOMY N/A 11/08/2020   Procedure: LUMBAR FIVE AND SACRAL ONE LAMINECTOMY/DECOMPRESSION MICRODISCECTOMY 1 LEVEL;  Surgeon: Venita Lick, MD;  Location: [facility];  Service: Orthopedics;  Laterality: N/A;   TONSILLECTOMY     TOTAL KNEE ARTHROPLASTY Right 11/05/2021   Procedure: RIGHT TOTAL KNEE ARTHROPLASTY;  Surgeon: Marcene Corning, MD;  Location: [facility];  Service: Orthopedics;  Laterality: Right;    SOCIAL HISTORY: Social History   Socioeconomic History   Marital status: Married    Spouse name: Not on file   Number of children: 2   Years of education: Not on file   Highest education level: Not on file  Occupational History   Occupation: Pre-school teacher  Tobacco Use   Smoking status: Never   Smokeless tobacco: Never  Vaping Use   Vaping Use:  Never used  Substance and Sexual Activity   Alcohol use: Never   Drug use: No   Sexual activity: Not on file  Other Topics Concern   Not on file  Social History Narrative   Not on file   Social Determinants of Health   Financial Resource Strain: Not on file  Food Insecurity: Not on file  Transportation Needs: Not on file  Physical Activity: Not on file  Stress: Not on file  Social Connections: Not on file  Intimate Partner Violence: Not on file    FAMILY HISTORY: Family History  Adopted: Yes    ALLERGIES:  is allergic to sulfa antibiotics and keflex [cephalexin].  MEDICATIONS:  Current Outpatient Medications  Medication Sig Dispense Refill   acetaminophen (TYLENOL) 500 MG tablet Take 1,000-1,500 mg by mouth every 8 (eight) hours as needed for moderate pain.     albuterol (VENTOLIN HFA) 108 (90 Base) MCG/ACT inhaler Inhale 2 puffs into the lungs every 6 (six) hours as needed for wheezing or shortness of breath.     cloNIDine (CATAPRES) 0.3 MG tablet Take 0.3 mg by mouth at bedtime.  Fluticasone-Umeclidin-Vilant (TRELEGY ELLIPTA) 200-62.5-25 MCG/ACT AEPB Inhale 1 puff into the lungs daily. 28 each 0   gabapentin (NEURONTIN) 300 MG capsule Take 300-600 mg by mouth See admin instructions. Taking 2 capsules (600 mg) in the am and 300 mg at bedtime     ipratropium (ATROVENT) 0.03 % nasal spray Place 2 sprays into both nostrils at bedtime as needed for allergies.     lamoTRIgine (LAMICTAL) 150 MG tablet Take 150 mg by mouth daily after breakfast.      metFORMIN (GLUCOPHAGE) 500 MG tablet Take 500 mg by mouth 2 (two) times daily with a meal.      ondansetron (ZOFRAN-ODT) 4 MG disintegrating tablet 4mg  ODT q4 hours prn nausea/vomit 30 tablet 0   oxyCODONE-acetaminophen (PERCOCET) 5-325 MG tablet Take 1-2 tablets by mouth every 6 (six) hours as needed for severe pain or moderate pain (post op pain). 40 tablet 0   oxymetazoline (AFRIN) 0.05 % nasal spray Place 1 spray into both  nostrils 2 (two) times daily as needed for congestion.     rivaroxaban (XARELTO) 20 MG TABS tablet Take 1 tablet (20 mg total) by mouth daily with supper. 30 tablet 0   rOPINIRole (REQUIP) 4 MG tablet Take 12 mg by mouth at bedtime.     traMADol (ULTRAM) 50 MG tablet Take 50 mg by mouth 2 (two) times daily as needed for moderate pain.     triamcinolone (KENALOG) 0.1 % paste Use as directed 1 application  in the mouth or throat 2 (two) times daily as needed for mouth pain.     TRINTELLIX 20 MG TABS tablet Take 20 mg by mouth daily.     Vitamin D, Ergocalciferol, (DRISDOL) 1.25 MG (50000 UNIT) CAPS capsule Take 50,000 Units by mouth once a week.     No current facility-administered medications for this visit.    REVIEW OF SYSTEMS:   Constitutional: ( - ) fevers, ( - )  chills , ( - ) night sweats Eyes: ( - ) blurriness of vision, ( - ) double vision, ( - ) watery eyes Ears, nose, mouth, throat, and face: ( - ) mucositis, ( - ) sore throat Respiratory: ( - ) cough, ( - ) dyspnea, ( - ) wheezes Cardiovascular: ( - ) palpitation, ( - ) chest discomfort, ( - ) lower extremity swelling Gastrointestinal:  ( - ) nausea, ( - ) heartburn, ( - ) change in bowel habits Skin: ( - ) abnormal skin rashes Lymphatics: ( - ) new lymphadenopathy, ( - ) easy bruising Neurological: ( - ) numbness, ( - ) tingling, ( - ) new weaknesses Behavioral/Psych: ( - ) mood change, ( - ) new changes  All other systems were reviewed with the patient and are negative.  PHYSICAL EXAMINATION:  Vitals:   12/19/21 1444  BP: 133/85  Pulse: 88  Resp: 15  Temp: 98.2 F (36.8 C)  SpO2: 100%   Filed Weights   12/19/21 1444  Weight: 277 lb 3.2 oz (125.7 kg)    GENERAL: well appearing middle aged Caucasian female. Alert, no distress and comfortable SKIN: skin color, texture, turgor are normal, no rashes or significant lesions EYES: conjunctiva are pink and non-injected, sclera clear LUNGS: clear to auscultation and  percussion with normal breathing effort HEART: regular rate & rhythm and no murmurs and no lower extremity edema Musculoskeletal: no cyanosis of digits and no clubbing  PSYCH: alert & oriented x 3, fluent speech NEURO: no focal motor/sensory deficits  LABORATORY DATA:  I have reviewed the data as listed    Latest Ref Rng & Units 12/19/2021    1:55 PM 10/30/2021    9:28 AM 10/05/2021    5:33 PM  CBC  WBC 4.0 - 10.5 K/uL 7.3  4.9  7.4   Hemoglobin 12.0 - 15.0 g/dL 63.7  85.8  85.0   Hematocrit 36.0 - 46.0 % 37.9  41.0  44.9   Platelets 150 - 400 K/uL 299  210  257        Latest Ref Rng & Units 12/19/2021    1:55 PM 10/30/2021    9:28 AM 10/05/2021    5:33 PM  CMP  Glucose 70 - 99 mg/dL 97  86  277   BUN 6 - 20 mg/dL 18  17  16    Creatinine 0.44 - 1.00 mg/dL  4.12  8.78   Sodium 135 - 145 mmol/L 140  140  139   Potassium 3.5 - 5.1 mmol/L 4.2  4.0  4.3   Chloride 98 - 111 mmol/L 105  106  106   CO2 22 - 32 mmol/L 30  27  25    Calcium 8.9 - 10.3 mg/dL 9.2  9.2  8.8   Total Protein 6.5 - 8.1 g/dL 7.3   7.4   Total Bilirubin 0.3 - 1.2 mg/dL 0.5   1.0   Alkaline Phos 38 - 126 U/L 101   81   AST 15 - 41 U/L 13   18   ALT 0 - 44 U/L 10   17    RADIOGRAPHIC STUDIES: No results found.  ASSESSMENT & PLAN Erika Woods 56 y.o. female with medical history significant for recurrent pulmonary emboli who presents for a follow up visit.   After review of the labs, review of the records, and discussion with the patient the patients findings are most consistent with recurrent pulmonary emboli.  The patient did have an unprovoked pulmonary emboli as her second PE and therefore indefinite anticoagulation will be recommended.  Additionally she has proven that every time she discontinues her anticoagulation even for a brief period of time she develops a blood clot.  Due to this I would recommend no interruption in her anticoagulation therapy unless absolutely necessary.  We will plan to see the  patient back in approximate 6 months time in order to reevaluate.   # Recurrent Pulmonary Emboli #Indefinite Anticoagulation  -- The patient's first VTE was a provoked pulmonary emboli and she received limited duration anticoagulation.  Her second pulmonary emboli was unprovoked.  Subsequent pulmonary emboli have occurred with even brief discontinuation of anticoagulation. --I would recommend indefinite anticoagulation.  Can continue Xarelto 20 mg PO daily therapy for now but if there is any difficulties would consider transition to Eliquis. --Prior hypercoagulation work-up was unremarkable. -- Would not recommend discontinuation of Xarelto even for a short period of time. --Labs today show creatinine of 0.9, white blood cell count 7.3, hemoglobin 0.7, MCV 88.8, and platelets of 299 --Return to clinic in 6 months time to reevaluate.  No orders of the defined types were placed in this encounter.   All questions were answered. The patient knows to call the clinic with any problems, questions or concerns.  A total of more than 30 minutes were spent on this encounter with face-to-face time and non-face-to-face time, including preparing to see the patient, ordering tests and/or medications, counseling the patient and coordination of care as outlined above.   Erika Ok, MD Department  of Hematology/Oncology [facility] Phone: [phone] Pager: [phone] Email: [email or web address]  12/19/2021 4:51 PM

## 2021-12-20 ENCOUNTER — Telehealth: Payer: Self-pay | Admitting: Hematology and Oncology

## 2021-12-20 NOTE — Telephone Encounter (Signed)
Per 8/10 los called and left message for pt

## 2021-12-25 ENCOUNTER — Ambulatory Visit: Payer: Commercial Managed Care - PPO | Admitting: Hematology and Oncology

## 2021-12-25 ENCOUNTER — Other Ambulatory Visit: Payer: Commercial Managed Care - PPO

## 2022-01-20 ENCOUNTER — Other Ambulatory Visit: Payer: Self-pay | Admitting: Gastroenterology

## 2022-01-20 DIAGNOSIS — K7460 Unspecified cirrhosis of liver: Secondary | ICD-10-CM

## 2022-01-24 ENCOUNTER — Ambulatory Visit
Admission: RE | Admit: 2022-01-24 | Discharge: 2022-01-24 | Disposition: A | Discharge status: Home / Self Care | Payer: Commercial Managed Care - PPO | Source: Ambulatory Visit | Attending: Gastroenterology | Admitting: Gastroenterology

## 2022-01-24 DIAGNOSIS — K7460 Unspecified cirrhosis of liver: Secondary | ICD-10-CM

## 2022-02-05 ENCOUNTER — Other Ambulatory Visit: Payer: Self-pay | Admitting: Gastroenterology

## 2022-02-13 ENCOUNTER — Other Ambulatory Visit: Payer: Self-pay | Admitting: Gastroenterology

## 2022-02-21 ENCOUNTER — Other Ambulatory Visit (HOSPITAL_BASED_OUTPATIENT_CLINIC_OR_DEPARTMENT_OTHER): Payer: Self-pay

## 2022-03-31 ENCOUNTER — Telehealth: Payer: Self-pay | Admitting: *Deleted

## 2022-03-31 ENCOUNTER — Telehealth: Payer: Self-pay | Admitting: Pulmonary Disease

## 2022-03-31 ENCOUNTER — Other Ambulatory Visit: Payer: Self-pay | Admitting: *Deleted

## 2022-03-31 MED ORDER — ENOXAPARIN SODIUM 40 MG/0.4ML IJ SOSY: 120.0000 mg | PREFILLED_SYRINGE | Freq: Two times a day (BID) | INTRAMUSCULAR | 0 refills | Status: DC

## 2022-03-31 NOTE — Telephone Encounter (Signed)
Erika Woods states that she is scheduled for an EGD with propofol on 04/02/2022.   I did not even get a clearance request from this Erika Woods just called me and asked if you were taking control over the Xarelto.   Does she need a bridge?  Sorry for all the confusion sir, I'm just as confused and trying to play catch up on my end.   Thank you sir

## 2022-03-31 NOTE — Telephone Encounter (Signed)
Called Tiffany ant number provided. No answer. Voicemail was left.

## 2022-03-31 NOTE — Telephone Encounter (Signed)
What is the plan for surgery? What is being done. I can assist but I am not sure what they are doing.

## 2022-03-31 NOTE — Telephone Encounter (Signed)
Received vm message from [facility]. Pt is scheduled for EGD on 04/02/22. This office is asking for guidance on Xarelto prior to EGD.    Please advise. And does she need Lovenox bridging for this procedure.

## 2022-03-31 NOTE — Telephone Encounter (Signed)
Called and spoke to tiffany. Got the fax number she wanted to send paperwork too. Nothing further needed

## 2022-03-31 NOTE — Telephone Encounter (Signed)
Sir,  Are you going to be in charge of the Xarelto for this patient after surgery?  Please advise sir

## 2022-04-01 NOTE — Telephone Encounter (Signed)
Dr Judeth Horn message me yesterday to informed me that he called and spoke to Tiffany about patients Xarelto and has taken care of this matter already. Called and updated patient that she is good and ok to proceed tomorrow with her procedure. Nothing further needed

## 2022-04-02 ENCOUNTER — Other Ambulatory Visit: Payer: Self-pay

## 2022-04-02 ENCOUNTER — Encounter (HOSPITAL_COMMUNITY)
Admission: RE | Disposition: A | Discharge status: Home / Self Care | Payer: Self-pay | Source: Ambulatory Visit | Attending: Gastroenterology

## 2022-04-02 ENCOUNTER — Encounter (HOSPITAL_COMMUNITY): Payer: Self-pay | Admitting: Gastroenterology

## 2022-04-02 ENCOUNTER — Ambulatory Visit (HOSPITAL_COMMUNITY): Payer: Commercial Managed Care - PPO | Admitting: Anesthesiology

## 2022-04-02 ENCOUNTER — Ambulatory Visit (HOSPITAL_COMMUNITY)
Admission: RE | Admit: 2022-04-02 | Discharge: 2022-04-02 | Disposition: A | Discharge status: Home / Self Care | Payer: Commercial Managed Care - PPO | Source: Ambulatory Visit | Attending: Gastroenterology | Admitting: Gastroenterology

## 2022-04-02 ENCOUNTER — Ambulatory Visit (HOSPITAL_BASED_OUTPATIENT_CLINIC_OR_DEPARTMENT_OTHER): Payer: Commercial Managed Care - PPO | Admitting: Anesthesiology

## 2022-04-02 DIAGNOSIS — G4733 Obstructive sleep apnea (adult) (pediatric): Secondary | ICD-10-CM | POA: Diagnosis not present

## 2022-04-02 DIAGNOSIS — E119 Type 2 diabetes mellitus without complications: Secondary | ICD-10-CM | POA: Insufficient documentation

## 2022-04-02 DIAGNOSIS — K2210 Ulcer of esophagus without bleeding: Secondary | ICD-10-CM

## 2022-04-02 DIAGNOSIS — R1310 Dysphagia, unspecified: Secondary | ICD-10-CM | POA: Insufficient documentation

## 2022-04-02 DIAGNOSIS — M1990 Unspecified osteoarthritis, unspecified site: Secondary | ICD-10-CM | POA: Insufficient documentation

## 2022-04-02 DIAGNOSIS — Z7984 Long term (current) use of oral hypoglycemic drugs: Secondary | ICD-10-CM | POA: Diagnosis not present

## 2022-04-02 DIAGNOSIS — K449 Diaphragmatic hernia without obstruction or gangrene: Secondary | ICD-10-CM

## 2022-04-02 DIAGNOSIS — K2100 Gastro-esophageal reflux disease with esophagitis, without bleeding: Secondary | ICD-10-CM | POA: Insufficient documentation

## 2022-04-02 DIAGNOSIS — K222 Esophageal obstruction: Secondary | ICD-10-CM | POA: Insufficient documentation

## 2022-04-02 DIAGNOSIS — F418 Other specified anxiety disorders: Secondary | ICD-10-CM

## 2022-04-02 HISTORY — PX: ESOPHAGOGASTRODUODENOSCOPY (EGD) WITH PROPOFOL: SHX5813

## 2022-04-02 LAB — GLUCOSE, CAPILLARY
Glucose-Capillary: 73 mg/dL (ref 70–99)
Glucose-Capillary: 99 mg/dL (ref 70–99)

## 2022-04-02 SURGERY — ESOPHAGOGASTRODUODENOSCOPY (EGD) WITH PROPOFOL
Anesthesia: Monitor Anesthesia Care | Laterality: Bilateral

## 2022-04-02 MED ORDER — SODIUM CHLORIDE 0.9 % IV SOLN: INTRAVENOUS | Status: DC

## 2022-04-02 MED ORDER — PROPOFOL 500 MG/50ML IV EMUL
INTRAVENOUS | Status: DC | PRN
  Administered 2022-04-02: 150 ug/kg/min via INTRAVENOUS

## 2022-04-02 MED ORDER — LACTATED RINGERS IV SOLN: INTRAVENOUS | Status: DC | PRN

## 2022-04-02 MED ORDER — LIDOCAINE 2% (20 MG/ML) 5 ML SYRINGE
INTRAMUSCULAR | Status: DC | PRN
  Administered 2022-04-02: 100 mg via INTRAVENOUS

## 2022-04-02 MED ORDER — PROPOFOL 10 MG/ML IV BOLUS
INTRAVENOUS | Status: DC | PRN
  Administered 2022-04-02: 30 mg via INTRAVENOUS
  Administered 2022-04-02: 20 mg via INTRAVENOUS

## 2022-04-02 MED ORDER — DEXLANSOPRAZOLE 60 MG PO CPDR: 60.0000 mg | DELAYED_RELEASE_CAPSULE | Freq: Every day | ORAL | 4 refills | Status: DC

## 2022-04-02 SURGICAL SUPPLY — 15 items

## 2022-04-02 NOTE — Discharge Instructions (Signed)

## 2022-04-02 NOTE — Op Note (Signed)
[facility] Patient Name: Erika Woods Procedure Date: 04/02/2022 MRN: [id number] Attending MD: Willis Modena , MD, [id number] Date of Birth: 1966/03/03 CSN: [id number] Age: 56 Admit Type: Outpatient Procedure:                Upper GI endoscopy Indications:              Dysphagia, Abnormal cine-esophagram Providers:                Willis Modena, MD, Fransisca Connors, Marja Kays, Technician Referring MD:              Medicines:                Monitored Anesthesia Care Complications:            No immediate complications. Estimated Blood Loss:     Estimated blood loss: none. Procedure:                Pre-Anesthesia Assessment:                           - Prior to the procedure, a History and Physical                            was performed, and patient medications and                            allergies were reviewed. The patient's tolerance of                            previous anesthesia was also reviewed. The risks                            and benefits of the procedure and the sedation                            options and risks were discussed with the patient.                            All questions were answered, and informed consent                            was obtained. Prior Anticoagulants: The patient                            last took Lovenox (enoxaparin) 1 day and Xarelto                            (rivaroxaban) 3 days prior to the procedure. ASA                            Grade Assessment: III - A patient with severe  systemic disease. After reviewing the risks and                            benefits, the patient was deemed in satisfactory                            condition to undergo the procedure.                           After obtaining informed consent, the endoscope was                            passed under direct vision. Throughout the                            procedure,  the patient's blood pressure, pulse, and                            oxygen saturations were monitored continuously. The                            [id number]-[id number] ([id number]) Olympus endoscope was introduced                            through the mouth, and advanced to the second part                            of duodenum. The upper GI endoscopy was                            accomplished without difficulty. The patient                            tolerated the procedure well. Scope In: Scope Out: Findings:      A 4 cm hiatal hernia was present.      LA Grade B (one or more mucosal breaks greater than 5 mm, not extending       between the tops of two mucosal folds) esophagitis was found.      One superficial esophageal ulcer was found near GE junction. Distal       esophageal stricture, about 15-16 mm in diameter, noted.      The exam of the esophagus was otherwise normal.      The entire examined stomach was normal.      The duodenal bulb, first portion of the duodenum and second portion of       the duodenum were normal. Impression:               - 4 cm hiatal hernia.                           - LA Grade B reflux esophagitis.                           - Esophageal ulcer near GE junction.                           -  Patient distal esophageal stricture.                           - Normal stomach.                           - Normal duodenal bulb, first portion of the                            duodenum and second portion of the duodenum.                           - Patient has refractory reflux as well as distal                            esophageal stricture; unable to perform dilatation                            until esophagitis/ulcer has resolved, and to do                            that, different antisecretory therapy is needed. Moderate Sedation:      None Recommendation:           - Patient has a contact number available for                            emergencies. The signs and  symptoms of potential                            delayed complications were discussed with the                            patient. Return to normal activities tomorrow.                            Written discharge instructions were provided to the                            patient.                           - Discharge patient to home (via wheelchair).                           - Resume previous diet today.                           - Resume Xarelto (rivaroxaban) at prior dose today.                           - No NSAIDs.                           - Use Dexilant (dexlansoprazole) 60 mg PO daily  indefinitely.                           - Return to GI clinic at appointment to be                            scheduled. If dysphagia persists, will need repeat                            EGD in 2-3 months after resolution of esophagitis. Procedure Code(s):        --- Professional ---                           [phone], Esophagogastroduodenoscopy, flexible,                            transoral; diagnostic, including collection of                            specimen(s) by brushing or washing, when performed                            (separate procedure) Diagnosis Code(s):        --- Professional ---                           K44.9, Diaphragmatic hernia without obstruction or                            gangrene                           K21.00, Gastro-esophageal reflux disease with                            esophagitis, without bleeding                           K22.10, Ulcer of esophagus without bleeding                           R13.10, Dysphagia, unspecified                           R93.3, Abnormal findings on diagnostic imaging of                            other parts of digestive tract CPT copyright 2022 American Medical Association. All rights reserved. The codes documented in this report are preliminary and upon coder review may  be revised to meet current  compliance requirements. Willis Modena, MD 04/02/2022 1:34:41 PM This report has been signed electronically. Number of Addenda: 0

## 2022-04-02 NOTE — Anesthesia Preprocedure Evaluation (Addendum)
Anesthesia Evaluation  Patient identified by MRN, date of birth, ID band Patient awake    Reviewed: Allergy & Precautions, NPO status , Patient's Chart, lab work & pertinent test results  History of Anesthesia Complications Negative for: history of anesthetic complications  Airway Mallampati: II  TM Distance: >3 FB Neck ROM: Full    Dental  (+) Missing,    Pulmonary sleep apnea , PE (2019)   Pulmonary exam normal        Cardiovascular Normal cardiovascular exam  TTE 04/2021: EF 60-65%, bicuspid AV, mild to moderate AR, no AS, mild dilatation of ascending aorta measuring 34m     Neuro/Psych   Anxiety Depression    negative neurological ROS     GI/Hepatic Neg liver ROS,GERD  ,,dysphagia   Endo/Other  diabetes, Type 2, Oral Hypoglycemic Agents    Renal/GU negative Renal ROS  negative genitourinary   Musculoskeletal  (+) Arthritis ,    Abdominal   Peds  Hematology negative hematology ROS (+)   Anesthesia Other Findings Day of surgery medications reviewed with patient.  Reproductive/Obstetrics negative OB ROS                              Anesthesia Physical Anesthesia Plan  ASA: 3  Anesthesia Plan: MAC   Post-op Pain Management: Minimal or no pain anticipated   Induction:   PONV Risk Score and Plan: Treatment may vary due to age or medical condition and Propofol infusion  Airway Management Planned: Natural Airway and Nasal Cannula  Additional Equipment: None  Intra-op Plan:   Post-operative Plan:   Informed Consent: I have reviewed the patients History and Physical, chart, labs and discussed the procedure including the risks, benefits and alternatives for the proposed anesthesia with the patient or authorized representative who has indicated his/her understanding and acceptance.       Plan Discussed with: CRNA  Anesthesia Plan Comments:          Anesthesia Quick  Evaluation

## 2022-04-02 NOTE — H&P (Signed)
[facility] H/P Note  Chief Complaint: dysphagia, cirrhosis  HPI: Erika Woods is an 56 y.o. female.  Dysphagia, weekly, solids and liquids.    Past Medical History:  Diagnosis Date   Acute pulmonary embolism ([name]) 11/11/2018   Acute pulmonary embolism with acute cor pulmonale ([name]) 09/16/2017   Acute superficial venous thrombosis of left lower extremity    Aortic insufficiency    Echocardiogram 08/2019: prob bicuspid AoV, mild to mod AI, mild AS (mean 12 mmHg), EF 55-60, no RWMA, Gr 1 DD, GLS -22.2%, normal RVSF, Ao Root 39 mm   Coronary CTA    Coronary CTA 08/2019: Calcium score 0, no evidence of CAD, ascending aorta 4 cm   Diabetes mellitus without complication ([name])    DM (diabetes mellitus) ([name]) 08/18/2011   GERD (gastroesophageal reflux disease)    Heart murmur    History of kidney stones    Impaired fasting glucose    Mood disorder ([name]) 08/18/2011   Obesity, Class III, BMI 40-49.9 (morbid obesity) ([name]) 09/16/2017   Obstructive sleep apnea on CPAP    Osteoarthritis of knee    Personal history of pulmonary embolism    PTSD (post-traumatic stress disorder) 08/18/2011   Pulmonary emboli ([name]) 09/15/2017   Renal disorder    kideny stones   Restless leg syndrome    Severe recurrent major depression without psychotic features ([name]) 02/17/2018   Sleep apnea 08/18/2011   Thoracic aortic aneurysm ([name])    Thoracic aortic aneurysm without rupture ([name])    Urinary tract infection    hx of chronic uti per pt   UTI (urinary tract infection)    recurrent    Past Surgical History:  Procedure Laterality Date   arm surgery Bilateral Skin removal   DILATATION & CURETTAGE/HYSTEROSCOPY WITH MYOSURE N/A 06/11/2018   Procedure: DILATATION & CURETTAGE/HYSTEROSCOPY WITH MYOSURE RESECTION OF ENDOMETRIAL THICKENING;  Surgeon: Richardean Chimera, MD;  Location: [facility];  Service: Gynecology;  Laterality: N/A;  BMI 56   DILATION AND EVACUATION     ESOPHAGOGASTRODUODENOSCOPY (EGD) WITH  PROPOFOL N/A 07/15/2018   Procedure: ESOPHAGOGASTRODUODENOSCOPY (EGD) WITH PROPOFOL;  Surgeon: Willis Modena, MD;  Location: [facility];  Service: Endoscopy;  Laterality: N/A;   ESOPHAGOGASTRODUODENOSCOPY (EGD) WITH PROPOFOL N/A 04/27/2019   Procedure: ESOPHAGOGASTRODUODENOSCOPY (EGD) WITH PROPOFOL;  Surgeon: Willis Modena, MD;  Location: [facility];  Service: Endoscopy;  Laterality: N/A;   IR ABLATE LIVER CRYOABLATION  10/28/2019   IR ABLATE LIVER CRYOABLATION  05/07/2020   IR RADIOLOGIST EVAL & MGMT  10/14/2019   IR RADIOLOGIST EVAL & MGMT  04/03/2020   KIDNEY STONE SURGERY     KNEE SURGERY Right    LUMBAR LAMINECTOMY/DECOMPRESSION MICRODISCECTOMY N/A 11/08/2020   Procedure: LUMBAR FIVE AND SACRAL ONE LAMINECTOMY/DECOMPRESSION MICRODISCECTOMY 1 LEVEL;  Surgeon: Venita Lick, MD;  Location: [facility];  Service: Orthopedics;  Laterality: N/A;   TONSILLECTOMY     TOTAL KNEE ARTHROPLASTY Right 11/05/2021   Procedure: RIGHT TOTAL KNEE ARTHROPLASTY;  Surgeon: Marcene Corning, MD;  Location: [facility];  Service: Orthopedics;  Laterality: Right;    Medications Prior to Admission  Medication Sig Dispense Refill   acetaminophen (TYLENOL) 500 MG tablet Take 1,000-1,500 mg by mouth every 8 (eight) hours as needed for moderate pain.     cloNIDine (CATAPRES) 0.3 MG tablet Take 0.3 mg by mouth at bedtime.     clotrimazole-betamethasone (LOTRISONE) cream Apply 1 Application topically 2 (two) times daily as needed (rash).     enoxaparin (LOVENOX) 40 MG/0.4ML injection Inject 1.2  mLs (120 mg total) into the skin every 12 (twelve) hours for 3 doses. 3.6 mL 0   fluticasone (FLONASE) 50 MCG/ACT nasal spray Place 1 spray into both nostrils 2 (two) times daily as needed for allergies.     gabapentin (NEURONTIN) 300 MG capsule Take 300-600 mg by mouth See admin instructions. Taking 2 capsules (600 mg) in the am and 300 mg at bedtime     ipratropium (ATROVENT) 0.03 % nasal spray Place 2 sprays into both nostrils at  bedtime as needed for allergies.     lamoTRIgine (LAMICTAL) 150 MG tablet Take 150 mg by mouth daily after breakfast.      metFORMIN (GLUCOPHAGE) 500 MG tablet Take 500 mg by mouth 2 (two) times daily with a meal.      oxymetazoline (AFRIN) 0.05 % nasal spray Place 1 spray into both nostrils 2 (two) times daily as needed for congestion.     rOPINIRole (REQUIP) 4 MG tablet Take 12 mg by mouth at bedtime.     TRINTELLIX 20 MG TABS tablet Take 20 mg by mouth daily.     Vitamin D, Ergocalciferol, (DRISDOL) 1.25 MG (50000 UNIT) CAPS capsule Take 50,000 Units by mouth every Monday.     albuterol (VENTOLIN HFA) 108 (90 Base) MCG/ACT inhaler Inhale 2 puffs into the lungs every 6 (six) hours as needed for wheezing or shortness of breath.     Fluticasone-Umeclidin-Vilant (TRELEGY ELLIPTA) 200-62.5-25 MCG/ACT AEPB Inhale 1 puff into the lungs daily. (Patient taking differently: Inhale 1 puff into the lungs daily as needed (shortness of breath).) 28 each 0   ondansetron (ZOFRAN-ODT) 4 MG disintegrating tablet 4mg  ODT q4 hours prn nausea/vomit (Patient not taking: Reported on 03/31/2022) 30 tablet 0   oxyCODONE-acetaminophen (PERCOCET) 5-325 MG tablet Take 1-2 tablets by mouth every 6 (six) hours as needed for severe pain or moderate pain (post op pain). (Patient not taking: Reported on 03/31/2022) 40 tablet 0   rivaroxaban (XARELTO) 20 MG TABS tablet Take 1 tablet (20 mg total) by mouth daily with supper. 30 tablet 0    Allergies:  Allergies  Allergen Reactions   Sulfa Antibiotics Hives   Keflex [Cephalexin] Itching    Family History  Adopted: Yes    Social History:  reports that she has never smoked. She has never used smokeless tobacco. She reports that she does not drink alcohol and does not use drugs.   ROS: As per HPI, all others negative   Blood pressure (!) 154/71, pulse 66, temperature 97.8 F (36.6 C), temperature source Temporal, resp. rate 19, height 5\' 5"  (1.651 m), weight 124.7 kg,  last menstrual period 01/29/2013, SpO2 100 %. General appearance: Overweight, NAD HEENT:  [place]/AT, anicteric NECK:  Thick, supple CV:  Regular RESP:  No respiratory distress ABD:  Soft, non-tender NEURO:  A/O, no encephalopathy  No results found for this or any previous visit (from the past 48 hour(s)). No results found.  Assessment/Plan   Cirrhosis, Child's A. Dysphagia. PE, Xarelto (on hold), Lovenox bridge (last dose last night). Abnormal esophagram (possible distal esophageal mucosal irregularity). Endoscopy with possible esophageal dilatation. Risks (bleeding, infection, bowel perforation that could require surgery, sedation-related changes in cardiopulmonary systems), benefits (identification and possible treatment of source of symptoms, exclusion of certain causes of symptoms), and alternatives (watchful waiting, radiographic imaging studies, empiric medical treatment) of upper endoscopy with possible esophageal dilatation (EGD +/- DIL) were explained to patient/family in detail and patient wishes to proceed.   04/02/2022, 12:38 PM

## 2022-04-02 NOTE — Transfer of Care (Signed)
Immediate Anesthesia Transfer of Care Note  Patient: Erika Woods  Procedure(s) Performed: ESOPHAGOGASTRODUODENOSCOPY (EGD) WITH PROPOFOL (Bilateral) BALLOON DILATION  Patient Location: PACU  Anesthesia Type:MAC  Level of Consciousness: awake, alert , and oriented  Airway & Oxygen Therapy: Patient Spontanous Breathing  Post-op Assessment: Report given to RN  Post vital signs: Reviewed and stable  Last Vitals:  Vitals Value Taken Time  BP 130/64 04/02/22 1314  Temp    Pulse 57 04/02/22 1316  Resp 19 04/02/22 1316  SpO2 100 % 04/02/22 1316  Vitals shown include unvalidated device data.  Last Pain:  Vitals:   04/02/22 1108  TempSrc: Temporal  PainSc: 0-No pain         Complications: No notable events documented.

## 2022-04-02 NOTE — Anesthesia Postprocedure Evaluation (Signed)
Anesthesia Post Note  Patient: Erika Woods  Procedure(s) Performed: ESOPHAGOGASTRODUODENOSCOPY (EGD) WITH PROPOFOL (Bilateral) BALLOON DILATION     Patient location during evaluation: PACU Anesthesia Type: MAC Level of consciousness: awake and alert Pain management: pain level controlled Vital Signs Assessment: post-procedure vital signs reviewed and stable Respiratory status: spontaneous breathing, nonlabored ventilation and respiratory function stable Cardiovascular status: blood pressure returned to baseline Postop Assessment: no apparent nausea or vomiting Anesthetic complications: no   No notable events documented.  Last Vitals:  Vitals:   04/02/22 1316 04/02/22 1320  BP: 130/64 122/85  Pulse: (!) 57 (!) 57  Resp: 19 (!) 21  Temp: 36.5 C   SpO2: 100% 98%    Last Pain:  Vitals:   04/02/22 1320  TempSrc:   PainSc: 0-No pain                 Marthenia Rolling

## 2022-04-04 ENCOUNTER — Encounter (HOSPITAL_COMMUNITY): Payer: Self-pay | Admitting: Gastroenterology

## 2022-05-06 ENCOUNTER — Emergency Department (HOSPITAL_BASED_OUTPATIENT_CLINIC_OR_DEPARTMENT_OTHER): Payer: Commercial Managed Care - PPO

## 2022-05-06 ENCOUNTER — Emergency Department (HOSPITAL_BASED_OUTPATIENT_CLINIC_OR_DEPARTMENT_OTHER)
Admission: EM | Admit: 2022-05-06 | Discharge: 2022-05-06 | Disposition: A | Discharge status: Home / Self Care | Payer: Commercial Managed Care - PPO | Attending: Emergency Medicine | Admitting: Emergency Medicine

## 2022-05-06 ENCOUNTER — Other Ambulatory Visit: Payer: Self-pay

## 2022-05-06 ENCOUNTER — Encounter (HOSPITAL_BASED_OUTPATIENT_CLINIC_OR_DEPARTMENT_OTHER): Payer: Self-pay | Admitting: Emergency Medicine

## 2022-05-06 DIAGNOSIS — R0602 Shortness of breath: Secondary | ICD-10-CM | POA: Insufficient documentation

## 2022-05-06 DIAGNOSIS — R062 Wheezing: Secondary | ICD-10-CM | POA: Diagnosis not present

## 2022-05-06 DIAGNOSIS — Z20822 Contact with and (suspected) exposure to covid-19: Secondary | ICD-10-CM | POA: Insufficient documentation

## 2022-05-06 DIAGNOSIS — R059 Cough, unspecified: Secondary | ICD-10-CM | POA: Diagnosis not present

## 2022-05-06 DIAGNOSIS — R110 Nausea: Secondary | ICD-10-CM | POA: Diagnosis not present

## 2022-05-06 DIAGNOSIS — Z96651 Presence of right artificial knee joint: Secondary | ICD-10-CM | POA: Diagnosis not present

## 2022-05-06 DIAGNOSIS — R0981 Nasal congestion: Secondary | ICD-10-CM | POA: Insufficient documentation

## 2022-05-06 DIAGNOSIS — E119 Type 2 diabetes mellitus without complications: Secondary | ICD-10-CM | POA: Diagnosis not present

## 2022-05-06 DIAGNOSIS — Z7984 Long term (current) use of oral hypoglycemic drugs: Secondary | ICD-10-CM | POA: Insufficient documentation

## 2022-05-06 DIAGNOSIS — Z7901 Long term (current) use of anticoagulants: Secondary | ICD-10-CM | POA: Diagnosis not present

## 2022-05-06 LAB — CBC WITH DIFFERENTIAL/PLATELET
Abs Immature Granulocytes: 0.04 10*3/uL (ref 0.00–0.07)
Basophils Absolute: 0.1 10*3/uL (ref 0.0–0.1)
Basophils Relative: 1 %
Eosinophils Absolute: 0.3 10*3/uL (ref 0.0–0.5)
Eosinophils Relative: 5 %
HCT: 36.1 % (ref 36.0–46.0)
Hemoglobin: 10.9 g/dL — ABNORMAL LOW (ref 12.0–15.0)
Immature Granulocytes: 1 %
Lymphocytes Relative: 24 %
Lymphs Abs: 1.6 10*3/uL (ref 0.7–4.0)
MCH: 26.5 pg (ref 26.0–34.0)
MCHC: 30.2 g/dL (ref 30.0–36.0)
MCV: 87.6 fL (ref 80.0–100.0)
Monocytes Absolute: 0.4 10*3/uL (ref 0.1–1.0)
Monocytes Relative: 7 %
Neutro Abs: 4.1 10*3/uL (ref 1.7–7.7)
Neutrophils Relative %: 62 %
Platelets: 193 10*3/uL (ref 150–400)
RBC: 4.12 MIL/uL (ref 3.87–5.11)
RDW: 15.9 % — ABNORMAL HIGH (ref 11.5–15.5)
WBC: 6.6 10*3/uL (ref 4.0–10.5)
nRBC: 0 % (ref 0.0–0.2)

## 2022-05-06 LAB — BASIC METABOLIC PANEL
Anion gap: 5 (ref 5–15)
BUN: 17 mg/dL (ref 6–20)
CO2: 27 mmol/L (ref 22–32)
Calcium: 8.6 mg/dL — ABNORMAL LOW (ref 8.9–10.3)
Chloride: 107 mmol/L (ref 98–111)
Creatinine, Ser: 0.84 mg/dL (ref 0.44–1.00)
GFR, Estimated: >60 mL/min (ref >60)
Glucose, Bld: 88 mg/dL (ref 70–99)
Potassium: 4.2 mmol/L (ref 3.5–5.1)
Sodium: 139 mmol/L (ref 135–145)

## 2022-05-06 LAB — RESP PANEL BY RT-PCR (RSV, FLU A&B, COVID)  RVPGX2
Influenza A by PCR: NEGATIVE
Influenza B by PCR: NEGATIVE
Resp Syncytial Virus by PCR: NEGATIVE
SARS Coronavirus 2 by RT PCR: NEGATIVE

## 2022-05-06 LAB — BRAIN NATRIURETIC PEPTIDE: B Natriuretic Peptide: 100.6 pg/mL — ABNORMAL HIGH (ref 0.0–100.0)

## 2022-05-06 MED ORDER — IOHEXOL 350 MG/ML SOLN
100.0000 mL | Freq: Once | INTRAVENOUS | Status: AC | PRN
  Administered 2022-05-06: 100 mL via INTRAVENOUS

## 2022-05-06 MED ORDER — IPRATROPIUM-ALBUTEROL 0.5-2.5 (3) MG/3ML IN SOLN
3.0000 mL | Freq: Once | RESPIRATORY_TRACT | Status: AC
  Administered 2022-05-06: 3 mL via RESPIRATORY_TRACT
  Filled 2022-05-06: qty 3

## 2022-05-06 MED ORDER — BENZONATATE 100 MG PO CAPS: 100.0000 mg | ORAL_CAPSULE | Freq: Three times a day (TID) | ORAL | 0 refills | Status: DC

## 2022-05-06 MED ORDER — DOXYCYCLINE HYCLATE 100 MG PO CAPS: 100.0000 mg | ORAL_CAPSULE | Freq: Two times a day (BID) | ORAL | 0 refills | Status: DC

## 2022-05-06 NOTE — ED Notes (Signed)
Ambulated on r/a SpO2 98-99%

## 2022-05-06 NOTE — ED Notes (Signed)
Patient transported to X-ray 

## 2022-05-06 NOTE — ED Triage Notes (Signed)
Patient presents C/O SOB, cough, congestion, wheezing X several days. Labored breathing in triage.  RT in triage to evaluate

## 2022-05-06 NOTE — ED Provider Notes (Signed)
[facility] EMERGENCY DEPARTMENT Provider Note   CSN: [id number] Arrival date & time: 05/06/22  6789     History  Chief Complaint  Patient presents with   Shortness of Breath    Erika Woods is a 56 y.o. female with a past medical history significant for diabetes, history of PE on chronic September 2, history of thoracic aortic aneurysm, PTSD presenting to the emergency room for evaluation of shortness of breath.  Patient reports she has had worsening shortness of breath and wheezing in the last 7 days.  Patient reports she began to have cough, congestion 3 days ago.  She has tried her albuterol inhaler at home with no improvement.  She reports nausea without vomiting.  She denies lower extremity edema.  No recent travel or surgery.  No history of cancer.  No history of heart failure.  Patient reports she has been exposed to someone with COVID however she does not feel like she had flulike symptoms.  Patient reports she checked her O2 sat at home which sometimes can be as low as 88% but it did not persist.   Shortness of Breath     Past Medical History:  Diagnosis Date   Acute pulmonary embolism ([name]) 11/11/2018   Acute pulmonary embolism with acute cor pulmonale ([name]) 09/16/2017   Acute superficial venous thrombosis of left lower extremity    Aortic insufficiency    Echocardiogram 08/2019: prob bicuspid AoV, mild to mod AI, mild AS (mean 12 mmHg), EF 55-60, no RWMA, Gr 1 DD, GLS -22.2%, normal RVSF, Ao Root 39 mm   Coronary CTA    Coronary CTA 08/2019: Calcium score 0, no evidence of CAD, ascending aorta 4 cm   Diabetes mellitus without complication ([name])    DM (diabetes mellitus) ([name]) 08/18/2011   GERD (gastroesophageal reflux disease)    Heart murmur    History of kidney stones    Impaired fasting glucose    Mood disorder ([name]) 08/18/2011   Obesity, Class III, BMI 40-49.9 (morbid obesity) ([name]) 09/16/2017   Obstructive sleep apnea on CPAP    Osteoarthritis of  knee    Personal history of pulmonary embolism    PTSD (post-traumatic stress disorder) 08/18/2011   Pulmonary emboli ([name]) 09/15/2017   Renal disorder    kideny stones   Restless leg syndrome    Severe recurrent major depression without psychotic features ([name]) 02/17/2018   Sleep apnea 08/18/2011   Thoracic aortic aneurysm ([name])    Thoracic aortic aneurysm without rupture ([name])    Urinary tract infection    hx of chronic uti per pt   UTI (urinary tract infection)    recurrent   Past Surgical History:  Procedure Laterality Date   arm surgery Bilateral Skin removal   DILATATION & CURETTAGE/HYSTEROSCOPY WITH MYOSURE N/A 06/11/2018   Procedure: DILATATION & CURETTAGE/HYSTEROSCOPY WITH MYOSURE RESECTION OF ENDOMETRIAL THICKENING;  Surgeon: Richardean Chimera, MD;  Location: [facility];  Service: Gynecology;  Laterality: N/A;  BMI 56   DILATION AND EVACUATION     ESOPHAGOGASTRODUODENOSCOPY (EGD) WITH PROPOFOL N/A 07/15/2018   Procedure: ESOPHAGOGASTRODUODENOSCOPY (EGD) WITH PROPOFOL;  Surgeon: Willis Modena, MD;  Location: [facility];  Service: Endoscopy;  Laterality: N/A;   ESOPHAGOGASTRODUODENOSCOPY (EGD) WITH PROPOFOL N/A 04/27/2019   Procedure: ESOPHAGOGASTRODUODENOSCOPY (EGD) WITH PROPOFOL;  Surgeon: Willis Modena, MD;  Location: [facility];  Service: Endoscopy;  Laterality: N/A;   ESOPHAGOGASTRODUODENOSCOPY (EGD) WITH PROPOFOL Bilateral 04/02/2022   Procedure: ESOPHAGOGASTRODUODENOSCOPY (EGD) WITH PROPOFOL;  Surgeon: Willis Modena, MD;  Location: [facility];  Service: Gastroenterology;  Laterality: Bilateral;   IR ABLATE LIVER CRYOABLATION  10/28/2019   IR ABLATE LIVER CRYOABLATION  05/07/2020   IR RADIOLOGIST EVAL & MGMT  10/14/2019   IR RADIOLOGIST EVAL & MGMT  04/03/2020   KIDNEY STONE SURGERY     KNEE SURGERY Right    LUMBAR LAMINECTOMY/DECOMPRESSION MICRODISCECTOMY N/A 11/08/2020   Procedure: LUMBAR FIVE AND SACRAL ONE LAMINECTOMY/DECOMPRESSION MICRODISCECTOMY 1 LEVEL;  Surgeon:  Venita Lick, MD;  Location: [facility];  Service: Orthopedics;  Laterality: N/A;   TONSILLECTOMY     TOTAL KNEE ARTHROPLASTY Right 11/05/2021   Procedure: RIGHT TOTAL KNEE ARTHROPLASTY;  Surgeon: Marcene Corning, MD;  Location: [facility];  Service: Orthopedics;  Laterality: Right;     Home Medications Prior to Admission medications   Medication Sig Start Date End Date Taking? Authorizing Provider  albuterol (VENTOLIN HFA) 108 (90 Base) MCG/ACT inhaler Inhale 2 puffs into the lungs every 6 (six) hours as needed for wheezing or shortness of breath.   Yes [provider]  doxycycline (VIBRAMYCIN) 100 MG capsule Take 1 capsule (100 mg total) by mouth 2 (two) times daily. 05/06/22  Yes Jeanelle Malling, PA  fluticasone (FLONASE) 50 MCG/ACT nasal spray Place 1 spray into both nostrils 2 (two) times daily as needed for allergies.   Yes [provider]  gabapentin (NEURONTIN) 300 MG capsule Take 300-600 mg by mouth See admin instructions. Taking 2 capsules (600 mg) in the am and 300 mg at bedtime 04/01/20  Yes [provider]  lamoTRIgine (LAMICTAL) 150 MG tablet Take 150 mg by mouth daily after breakfast.  06/30/18  Yes [provider]  metFORMIN (GLUCOPHAGE) 500 MG tablet Take 500 mg by mouth 2 (two) times daily with a meal.  10/28/13  Yes [provider]  rivaroxaban (XARELTO) 20 MG TABS tablet Take 1 tablet (20 mg total) by mouth daily with supper. 03/13/21  Yes Leroy Sea, MD  TRINTELLIX 20 MG TABS tablet Take 20 mg by mouth daily. 05/13/19  Yes [provider]  acetaminophen (TYLENOL) 500 MG tablet Take 1,000-1,500 mg by mouth every 8 (eight) hours as needed for moderate pain.    [provider]  cloNIDine (CATAPRES) 0.3 MG tablet Take 0.3 mg by mouth at bedtime.    [provider]  clotrimazole-betamethasone (LOTRISONE) cream Apply 1 Application topically 2 (two) times daily as needed (rash).    [provider]  dexlansoprazole  (DEXILANT) 60 MG capsule Take 1 capsule (60 mg total) by mouth daily. 04/02/22   Willis Modena, MD  enoxaparin (LOVENOX) 40 MG/0.4ML injection Inject 1.2 mLs (120 mg total) into the skin every 12 (twelve) hours for 3 doses. 03/31/22 04/02/22  Jaci Standard, MD  Fluticasone-Umeclidin-Vilant (TRELEGY ELLIPTA) 200-62.5-25 MCG/ACT AEPB Inhale 1 puff into the lungs daily. Patient taking differently: Inhale 1 puff into the lungs daily as needed (shortness of breath). 07/19/21   Hunsucker, Lesia Sago, MD  ipratropium (ATROVENT) 0.03 % nasal spray Place 2 sprays into both nostrils at bedtime as needed for allergies. 10/09/21   [provider]  oxymetazoline (AFRIN) 0.05 % nasal spray Place 1 spray into both nostrils 2 (two) times daily as needed for congestion.    [provider]  rOPINIRole (REQUIP) 4 MG tablet Take 12 mg by mouth at bedtime. 11/03/20   [provider]  Vitamin D, Ergocalciferol, (DRISDOL) 1.25 MG (50000 UNIT) CAPS capsule Take 50,000 Units by mouth every Monday. 05/25/21   [provider]      Allergies    Sulfa antibiotics and Keflex [cephalexin]    Review of Systems   Review of Systems  Respiratory:  Positive for shortness of breath.     Physical Exam Updated Vital Signs BP 111/60   Pulse 65   Temp 98 F (36.7 C)   Resp 17   Ht  (1.651 m)   Wt 124.7 kg   LMP 01/29/2013   SpO2 98%   BMI 45.76 kg/m  Physical Exam Vitals and nursing note reviewed.  Constitutional:      Appearance: Normal appearance.  HENT:     Head: Normocephalic and atraumatic.     Mouth/Throat:     Mouth: Mucous membranes are moist.  Eyes:     General: No scleral icterus. Cardiovascular:     Rate and Rhythm: Normal rate and regular rhythm.     Pulses: Normal pulses.     Heart sounds: Normal heart sounds.  Pulmonary:     Effort: Pulmonary effort is normal.     Breath sounds: Wheezing present.  Abdominal:     General: Abdomen is flat.      Palpations: Abdomen is soft.     Tenderness: There is no abdominal tenderness.  Musculoskeletal:        General: No deformity.  Skin:    General: Skin is warm.     Findings: No rash.  Neurological:     General: No focal deficit present.     Mental Status: She is alert.  Psychiatric:        Mood and Affect: Mood normal.     ED Results / Procedures / Treatments   Labs (all labs ordered are listed, but only abnormal results are displayed) Labs Reviewed  BASIC METABOLIC PANEL - Abnormal; Notable for the following components:      Result Value   Calcium 8.6 (*)    All other components within normal limits  CBC WITH DIFFERENTIAL/PLATELET - Abnormal; Notable for the following components:   Hemoglobin 10.9 (*)    RDW 15.9 (*)    All other components within normal limits  BRAIN NATRIURETIC PEPTIDE - Abnormal; Notable for the following components:   B Natriuretic Peptide 100.6 (*)    All other components within normal limits  RESP PANEL BY RT-PCR (RSV, FLU A&B, COVID)  [id number]    EKG EKG Interpretation  Date/Time:  Tuesday May 06 2022 09:53:24 EST Ventricular Rate:  67 PR Interval:  140 QRS Duration: 130 QT Interval:  412 QTC Calculation: 435 R Axis:   -41 Text Interpretation: Sinus rhythm RBBB and LAFB When compared to 10/05/21 LAFB is new Confirmed by Vonita Moss [phone]) on 05/06/2022 10:03:19 AM  Radiology CT Angio Chest PE W and/or Wo Contrast  Result Date: 05/06/2022 CLINICAL DATA:  Shortness of breath, cough, congestion EXAM: CT ANGIOGRAPHY CHEST WITH CONTRAST TECHNIQUE: Multidetector CT imaging of the chest was performed using the standard protocol during bolus administration of intravenous contrast. Multiplanar CT image reconstructions and MIPs were obtained to evaluate the vascular anatomy. RADIATION DOSE REDUCTION: This exam was performed according to the departmental dose-optimization program which includes automated exposure control, adjustment of the mA  and/or kV according to patient size and/or use of iterative reconstruction technique. CONTRAST:  OMNIPAQUE IOHEXOL 350 MG/ML SOLN COMPARISON:  10/05/2021 FINDINGS: Cardiovascular: Satisfactory opacification of the pulmonary arteries to the segmental level. No evidence of acute pulmonary embolism. Sequela of chronic pulmonary embolus with a thin cine key in the  right lower lobe pulmonary artery again noted. Dilated main pulmonary artery measuring 3.5 cm. Stable cardiomegaly. No pericardial effusion. Ascending thoracic aortic aneurysm measuring 4 cm in diameter. Mediastinum/Nodes: No enlarged mediastinal, hilar, or axillary lymph nodes. Thyroid gland, trachea, and esophagus demonstrate no significant findings. Lungs/Pleura: Mild patchy right lower lobe and left lower lobe ground-glass opacities which may be secondary to an infectious or inflammatory etiology. Upper Abdomen: No acute abnormality.  Small hiatal hernia. Musculoskeletal: No acute osseous abnormality. No aggressive osseous lesion. Review of the MIP images confirms the above findings. IMPRESSION: 1. No evidence of an acute pulmonary embolism. Sequela of chronic pulmonary embolus with a thin cine key in the right lower lobe pulmonary artery again noted. 2. Mild patchy right lower lobe and left lower lobe ground-glass opacities which may be secondary to an infectious or inflammatory etiology. 3. Stable cardiomegaly. 4. Ascending thoracic aortic aneurysm measuring 4 cm in diameter. Recommend annual imaging followup by CTA or MRA. This recommendation follows 2010 ACCF/AHA/AATS/ACR/ASA/SCA/SCAI/SIR/STS/SVM Guidelines for the Diagnosis and Management of Patients with Thoracic Aortic Disease. Circulation. 2010; 121: Z610-R604: E266-e369. Aortic aneurysm NOS (ICD10-I71.9) Electronically Signed   By: Elige KoHetal  Patel M.D.   On: 05/06/2022 11:49   DG Chest 2 View  Result Date: 05/06/2022 CLINICAL DATA:  Shortness of breath EXAM: CHEST - 2 VIEW COMPARISON:  10/30/2021  FINDINGS: Heart size is upper limits of normal, unchanged. No focal airspace consolidation, pleural effusion, or pneumothorax. IMPRESSION: No active cardiopulmonary disease. Electronically Signed   By: Duanne GuessNicholas  Plundo D.O.   On: 05/06/2022 10:14    Procedures Procedures    Medications Ordered in ED Medications  ipratropium-albuterol (DUONEB) 0.5-2.5 (3) MG/3ML nebulizer solution 3 mL (3 mLs Nebulization Given 05/06/22 0943)  iohexol (OMNIPAQUE) 350 MG/ML injection 100 mL (100 mLs Intravenous Contrast Given 05/06/22 1047)    ED Course/ Medical Decision Making/ A&P                           Medical Decision Making Amount and/or Complexity of Data Reviewed Labs: ordered. Radiology: ordered.  Risk Prescription drug management.   This patient presents to the ED for shortness of breath, this involves an extensive number of treatment options, and is a complaint that carries with a high risk of complications and morbidity.  The differential diagnosis includes CHF/ACS, COPD asthma, pneumonia, anaphylaxis, PE, pneumothorax, anxiety, COVID-19.  This is not an exhaustive list.  Lab tests: I ordered and personally interpreted labs.  The pertinent results include: WBC unremarkable. Hbg unremarkable. Platelets unremarkable. No electrolyte abnormalities noted.  BUN, creatinine unremarkable. BNP at baseline 100.6. Imaging studies: I ordered imaging studies. I personally reviewed, interpreted imaging and agree with the radiologist's interpretations. The results include: CTA chest showed mild patchy right lower lobe and left lower lobe ground-glass opacities which may be secondary to an infectious or inflammatory etiology.  Problem list/ ED course/ Critical interventions/ Medical management: HPI: See above Vital signs within normal range and stable throughout visit. Laboratory/imaging studies significant for: See above. On physical examination, patient is afebrile and appears in no acute  distress. This patient presents with dyspnea, most likely secondary to pneumonia. Presentation not consistent with acute cardiac etiologies to include ACS (non ischemic ekg, unremarkable trop), CHF, pericardial effusion / tamponade . Presentation and imaging studies not consistent with acute respiratory etiologies to include acute PE, pneumothorax , asthma exacerbation, allergic etiologies. Presentation also not consistent with non-cardiopulmonary causes to include toxidromes, metabolic etiologies such as acidemia  or electrolyte derangements, sepsis. Based on patient's clinical presentations and laboratory/imaging studies I suspect pneumonia.  I ordered ipratropium albuterol nebulizing treatment.  Reevaluation of the patient after these medications showed that the patient improved. Advised patient to follow-up with PCP for further evaluation and management.  Return to the ER if new or worsening symptoms.. I have reviewed the patient home medicines and have made adjustments as needed.  Cardiac monitoring/EKG: The patient was maintained on a cardiac monitor.  I personally reviewed and interpreted the cardiac monitor which showed an underlying rhythm of: sinus rhythm.  Additional history obtained: External records from outside source obtained and reviewed including: Chart review including previous notes, labs, imaging.  Consultations obtained: I requested consultation with Dr. Jarold Motto, and discussed lab and imaging findings as well as pertinent plan.  He/she agrees with the plan.  Disposition Continued outpatient therapy. Follow-up with PCP recommended for reevaluation of symptoms. Treatment plan discussed with patient.  Pt acknowledged understanding was agreeable to the plan. Worrisome signs and symptoms were discussed with patient, and patient acknowledged understanding to return to the ED if they noticed these signs and symptoms. Patient was stable upon discharge.   This chart was dictated using  voice recognition software.  Despite best efforts to proofread,  errors can occur which can change the documentation meaning.          Final Clinical Impression(s) / ED Diagnoses Final diagnoses:  Shortness of breath    Rx / DC Orders ED Discharge Orders          Ordered    doxycycline (VIBRAMYCIN) 100 MG capsule  2 times daily        05/06/22 1453              Jeanelle Malling, [place] 05/06/22 2151    Rondel Baton, MD 05/11/22 [phone]

## 2022-05-06 NOTE — Discharge Instructions (Addendum)
Please take your antibiotic as prescribed. Your CT scan showed an ascending thoracic aorta measuring to 4 cm so I recommend close follow-up with PCP for reevaluation.  Please do not hesitate to return to emergency department if worrisome signs symptoms we discussed become apparent.

## 2022-05-06 NOTE — ED Notes (Signed)
ED Provider at bedside. 

## 2022-06-19 ENCOUNTER — Encounter: Payer: Self-pay | Admitting: Family Medicine

## 2022-06-19 ENCOUNTER — Other Ambulatory Visit: Payer: Self-pay | Admitting: Family Medicine

## 2022-06-19 DIAGNOSIS — R0602 Shortness of breath: Secondary | ICD-10-CM

## 2022-06-20 ENCOUNTER — Ambulatory Visit
Admission: RE | Admit: 2022-06-20 | Discharge: 2022-06-20 | Disposition: A | Discharge status: Home / Self Care | Payer: Commercial Managed Care - PPO | Source: Ambulatory Visit | Attending: Family Medicine | Admitting: Family Medicine

## 2022-06-20 DIAGNOSIS — R0602 Shortness of breath: Secondary | ICD-10-CM

## 2022-06-20 MED ORDER — IOPAMIDOL (ISOVUE-370) INJECTION 76%
100.0000 mL | Freq: Once | INTRAVENOUS | Status: AC | PRN
  Administered 2022-06-20: 100 mL via INTRAVENOUS

## 2022-06-23 ENCOUNTER — Other Ambulatory Visit: Payer: Commercial Managed Care - PPO

## 2022-06-23 ENCOUNTER — Ambulatory Visit: Payer: Commercial Managed Care - PPO | Admitting: Hematology and Oncology

## 2022-06-26 ENCOUNTER — Ambulatory Visit: Payer: Commercial Managed Care - PPO | Admitting: Physician Assistant

## 2022-07-28 NOTE — Progress Notes (Unsigned)
Cardiology Office Note:    Date:  07/29/2022   ID:  Erika Woods, DOB 18-Sep-1965, MRN [id number]  PCP:  [name], MD   [facility] Providers Cardiologist:  Lauree Chandler, MD     Referring MD: [name], MD   Chief Complaint: follow-up aortic insufficiency  History of Present Illness:    Erika Woods is a very pleasant 57 y.o. female with a hx of diabetes, aortic valve insufficiency, sleep apnea on CPAP, PE, thoracic aortic aneurysm, and obesity.  Initially seen in August 2019 by Dr. Angelena Form for evaluation of thoracic aortic aneurysm.  Was admitted to [facility] May 2019 with an acute left thigh superficial thrombosis and found to have PE.  Had undergone procedure on her knee March 2019.  Echo 09/2017 with LVEF 65 to 70%, mild AI, dilated aortic root, dilated ascending aortic aneurysm 4.0 cm on CTA chest. Repeat chest CTA July 2019 with resolution of PE. Readmitted to [facility] July 2020 with recurrent PE and has remained on Xarelto. She stopped Xarelto June 2022 for back surgery and had recurrent PE June 2022.  She missed several doses of Xarelto October 2022 and had another PE.  RV strain noted on the echo in October 2022.  Echo December 2022 with LVEF 60 to 65%, trivial MR.  Bicuspid aortic valve with mild to moderate AI, mild aortic root dilatation at 4.2 cm. RV function back to normal. Seen in ED 06/05/2021 with dyspnea and right-sided chest pain. Chest CTA without evidence of PE, troponin negative.  Last cardiology clinic visit was 06/11/2021 with Dr. Angelena Form.  She had no specific cardiac symptoms and 1 year follow-up was recommended.  Today, she is here for follow-up. She is here alone. Has been doing well overall. Had pneumonia December 2023 and then was having drops in O2 sat, feeling very winded in February and Dr. Darron Doom ordered repeat CT. She did not have PE. Has not missed any doses of Xarelto. Underwent blood testing with Dr. Demetria Pore, oncology and no abnormality  was found. She is feeling well now with the exception of orthopedic issues. Had knee surgery, it is better but now problems with both feet. Is in a boot. Has chronic LE edema. Planning to start aquatic therapy soon. Takes clonidine for sleep, no history of hypertension. She denies chest pain, shortness of breath, fatigue, palpitations, melena, hematuria, hemoptysis, diaphoresis, weakness, presyncope, syncope, orthopnea, and PND.   Past Medical History:  Diagnosis Date   Acute pulmonary embolism ([place]) 11/11/2018   Acute pulmonary embolism with acute cor pulmonale ([place]) 09/16/2017   Acute superficial venous thrombosis of left lower extremity    Aortic insufficiency    Echocardiogram 08/2019: prob bicuspid AoV, mild to mod AI, mild AS (mean 12 mmHg), EF 55-60, no RWMA, Gr 1 DD, GLS -22.2%, normal RVSF, Ao Root 39 mm   Coronary CTA    Coronary CTA 08/2019: Calcium score 0, no evidence of CAD, ascending aorta 4 cm   Diabetes mellitus without complication ([name])    DM (diabetes mellitus) ([place]) 08/18/2011   GERD (gastroesophageal reflux disease)    Heart murmur    History of kidney stones    Impaired fasting glucose    Mood disorder ([place]) 08/18/2011   Obesity, Class III, BMI 40-49.9 (morbid obesity) ([place]) 09/16/2017   Obstructive sleep apnea on CPAP    Osteoarthritis of knee    Personal history of pulmonary embolism    PTSD (post-traumatic stress disorder) 08/18/2011   Pulmonary emboli (  [place]) 09/15/2017   Renal disorder    kideny stones   Restless leg syndrome    Severe recurrent major depression without psychotic features ([place]) 02/17/2018   Sleep apnea 08/18/2011   Thoracic aortic aneurysm [facility])    Thoracic aortic aneurysm without rupture ([name])    Urinary tract infection    hx of chronic uti per pt   UTI (urinary tract infection)    recurrent    Past Surgical History:  Procedure Laterality Date   arm surgery Bilateral Skin removal   DILATATION & CURETTAGE/HYSTEROSCOPY WITH MYOSURE N/A  06/11/2018   Procedure: DILATATION & CURETTAGE/HYSTEROSCOPY WITH MYOSURE RESECTION OF ENDOMETRIAL THICKENING;  Surgeon: [name], MD;  Location: [place];  Service: Gynecology;  Laterality: N/A;  BMI 56   DILATION AND EVACUATION     ESOPHAGOGASTRODUODENOSCOPY (EGD) WITH PROPOFOL N/A 07/15/2018   Procedure: ESOPHAGOGASTRODUODENOSCOPY (EGD) WITH PROPOFOL;  Surgeon: [name], MD;  Location: [facility];  Service: Endoscopy;  Laterality: N/A;   ESOPHAGOGASTRODUODENOSCOPY (EGD) WITH PROPOFOL N/A 04/27/2019   Procedure: ESOPHAGOGASTRODUODENOSCOPY (EGD) WITH PROPOFOL;  Surgeon: [name], MD;  Location: [facility];  Service: Endoscopy;  Laterality: N/A;   ESOPHAGOGASTRODUODENOSCOPY (EGD) WITH PROPOFOL Bilateral 04/02/2022   Procedure: ESOPHAGOGASTRODUODENOSCOPY (EGD) WITH PROPOFOL;  Surgeon: [name], MD;  Location: [facility];  Service: Gastroenterology;  Laterality: Bilateral;   IR ABLATE LIVER CRYOABLATION  10/28/2019   IR ABLATE LIVER CRYOABLATION  05/07/2020   IR RADIOLOGIST EVAL & MGMT  10/14/2019   IR RADIOLOGIST EVAL & MGMT  04/03/2020   KIDNEY STONE SURGERY     KNEE SURGERY Right    LUMBAR LAMINECTOMY/DECOMPRESSION MICRODISCECTOMY N/A 11/08/2020   Procedure: LUMBAR FIVE AND SACRAL ONE LAMINECTOMY/DECOMPRESSION MICRODISCECTOMY 1 LEVEL;  Surgeon: Melina Schools, MD;  Location: [place];  Service: Orthopedics;  Laterality: N/A;   TONSILLECTOMY     TOTAL KNEE ARTHROPLASTY Right 11/05/2021   Procedure: RIGHT TOTAL KNEE ARTHROPLASTY;  Surgeon: Melrose Nakayama, MD;  Location: [facility];  Service: Orthopedics;  Laterality: Right;    Current Medications: Current Meds  Medication Sig   acetaminophen (TYLENOL) 500 MG tablet Take 1,000-1,500 mg by mouth every 8 (eight) hours as needed for moderate pain.   albuterol (VENTOLIN HFA) 108 (90 Base) MCG/ACT inhaler Inhale 2 puffs into the lungs every 6 (six) hours as needed for wheezing or shortness of breath.   amoxicillin (AMOXIL) 500 MG  capsule Take 500 mg by mouth as needed (before dental).   Budeson-Glycopyrrol-Formoterol (BREZTRI AEROSPHERE) 160-9-4.8 MCG/ACT AERO Inhale into the lungs.   cloNIDine (CATAPRES) 0.3 MG tablet Take 0.3 mg by mouth at bedtime.   clotrimazole-betamethasone (LOTRISONE) cream Apply 1 Application topically 2 (two) times daily as needed (rash).   dexlansoprazole (DEXILANT) 60 MG capsule Take 1 capsule (60 mg total) by mouth daily.   Dextromethorphan-buPROPion ER 45-105 MG TBCR Take 1 tablet by mouth in the morning and at bedtime.   fluticasone (FLONASE) 50 MCG/ACT nasal spray Place 1 spray into both nostrils 2 (two) times daily as needed for allergies.   Fluticasone-Umeclidin-Vilant (TRELEGY ELLIPTA) 200-62.5-25 MCG/ACT AEPB Inhale 1 puff into the lungs daily. (Patient taking differently: Inhale 1 puff into the lungs daily as needed (shortness of breath).)   gabapentin (NEURONTIN) 300 MG capsule Take 300-600 mg by mouth See admin instructions. Taking 2 capsules (600 mg) in the am and 300 mg at bedtime   ipratropium (ATROVENT) 0.03 % nasal spray Place 2 sprays into both nostrils at bedtime as needed for allergies.   lamoTRIgine (LAMICTAL) 150 MG  tablet Take 150 mg by mouth daily after breakfast.    metFORMIN (GLUCOPHAGE) 500 MG tablet Take 500 mg by mouth 2 (two) times daily with a meal.    oxymetazoline (AFRIN) 0.05 % nasal spray Place 1 spray into both nostrils 2 (two) times daily as needed for congestion.   polyethylene glycol powder (GLYCOLAX/MIRALAX) 17 GM/SCOOP powder Take by mouth.   rivaroxaban (XARELTO) 20 MG TABS tablet Take 1 tablet (20 mg total) by mouth daily with supper.   rOPINIRole (REQUIP) 4 MG tablet Take 12 mg by mouth at bedtime.   Vitamin D, Ergocalciferol, (DRISDOL) 1.25 MG (50000 UNIT) CAPS capsule Take 50,000 Units by mouth every Monday.     Allergies:   Sulfa antibiotics and Keflex [cephalexin]   Social History   Socioeconomic History   Marital status: Married    Spouse  name: Not on file   Number of children: 2   Years of education: Not on file   Highest education level: Not on file  Occupational History   Occupation: Pre-school teacher  Tobacco Use   Smoking status: Never   Smokeless tobacco: Never  Vaping Use   Vaping Use: Never used  Substance and Sexual Activity   Alcohol use: Never   Drug use: No   Sexual activity: Not on file  Other Topics Concern   Not on file  Social History Narrative   Not on file   Social Determinants of Health   Financial Resource Strain: Not on file  Food Insecurity: Not on file  Transportation Needs: Not on file  Physical Activity: Not on file  Stress: Not on file  Social Connections: Not on file     Family History: The patient's family history is not on file. She was adopted.  ROS:   Please see the history of present illness.   All other systems reviewed and are negative.  Labs/Other Studies Reviewed:    The following studies were reviewed today:  CTA Chest PE 06/20/22 IMPRESSION: 1. Negative for an acute pulmonary embolism. 2. Chronic webbing or synechia in the right lower lobe pulmonary arteries are compatible with sequelae of old pulmonary embolism. 3. No acute chest abnormality. 4. Small hiatal hernia. 5. Ascending thoracic aorta is mildly aneurysmal measuring up to 4.0 cm. Recommend annual imaging followup by CTA or MRA. This recommendation follows 2010 ACCF/AHA/AATS/ACR/ASA/SCA/SCAI/SIR/STS/SVM Guidelines for the Diagnosis and Management of Patients with Thoracic Aortic Disease. Circulation. 2010; [id number]. Aortic aneurysm NOS (ICD10-I71.9)   Echo 04/30/21 1. Left ventricular ejection fraction, by estimation, is 60 to 65%. The  left ventricle has normal function. The left ventricle has no regional  wall motion abnormalities. Left ventricular diastolic parameters were  normal. The average left ventricular  global longitudinal strain is -20.2 %. The global longitudinal strain is   normal.   2. Right ventricular systolic function is normal. The right ventricular  size is normal. There is normal pulmonary artery systolic pressure.   3. The mitral valve is normal in structure. Trivial mitral valve  regurgitation. No evidence of mitral stenosis.   4. Partial fusion of right and left coronary cusp. The aortic valve is  bicuspid. Aortic valve regurgitation is mild to moderate. No aortic  stenosis is present. Aortic regurgitation PHT measures 657 msec.   5. Aortic dilatation noted. There is mild dilatation of the ascending  aorta, measuring 42 mm.   6. The inferior vena cava is normal in size with greater than 50%  respiratory variability, suggesting right atrial pressure of  3 mmHg.   Comparison(s): Prior images reviewed side by side. Prior ascending aorta  39 mm.   Coronary CAT 09/05/20 1. Coronary calcium score of 0. This was 0 percentile for age and sex matched control.   2. Normal coronary origin with right dominance.   3. No evidence of CAD.  CAD RADS 0.   4. Consider non-atherosclerotic causes of chest pain.   5. Mildly dilated ascending aorta with greatest diameter 4cm.   Recent Labs: 12/19/2021: ALT 10 05/06/2022: B Natriuretic Peptide 100.6; BUN 17; Creatinine, Ser 0.84; Hemoglobin 10.9; Platelets 193; Potassium 4.2; Sodium 139  Recent Lipid Panel From patient's portal February 2024 Total chol 159, LDL 59, HDL 62, trigs 189   Risk Assessment/Calculations:       Physical Exam:    VS:  BP 128/64   Pulse 73   Ht 5\' 5"  (1.651 m)   Wt (!) 303 lb 6.4 oz (137.6 kg)   LMP 01/29/2013   SpO2 96%   BMI 50.49 kg/m     Wt Readings from Last 3 Encounters:  07/29/22 (!) 303 lb 6.4 oz (137.6 kg)  05/06/22 275 lb (124.7 kg)  04/02/22 275 lb (124.7 kg)     GEN: Obese, well developed in no acute distress HEENT: Normal NECK: No JVD; No carotid bruits CARDIAC: RRR, 2/6 diastolic murmur. No rubs, gallops RESPIRATORY:  Clear to auscultation without  rales, wheezing or rhonchi  ABDOMEN: Soft, non-tender, non-distended MUSCULOSKELETAL:  bilateral LE edema; No deformity. 2+ pedal pulses, equal bilaterally SKIN: Warm and dry NEUROLOGIC:  Alert and oriented x 3 PSYCHIATRIC:  Normal affect   EKG:  EKG is not ordered today.     Diagnoses:    1. Aortic valve insufficiency, etiology of cardiac valve disease unspecified   2. Thoracic aortic aneurysm without rupture, unspecified part ([place])   3. Chronic pulmonary embolism without acute cor pulmonale, unspecified pulmonary embolism type ([place])   4. Hypertriglyceridemia    Assessment and Plan:     Thoracic aortic aneurysm: Mild dilatation of ascending aorta measuring 40 mm on CTA 06/20/22, measured 42 mm on echo 04/2021. Plan to repeat in 1 year.   Aortic insufficiency: Echo 04/2021 showed partial fusion of right and left coronary cusp, bicuspid aortic valve with mild to moderate aortic insufficiency. We discussed etiology of aortic insufficiency and timing of repeat echo.  I advised her of symptoms to report. Currently asymptomatic but insurance changes in October. Will plan to repeat echo in September 2024, sooner if clinically indicated.   History of PE: No missed doses of Xarelto. No bleeding concerns. Management per PCP. Continue Xarelto.   Hypertriglyceridemia: Triglycerides elevated at 189 06/2022.  Encouraged mostly plant-based diet avoiding processed foods, sugar, and saturated fat. 150 minutes moderate intensity exercise each week. LDL is well controlled.     Disposition: 6 months with Dr. Angelena Form following echo  Medication Adjustments/Labs and Tests Ordered: Current medicines are reviewed at length with the patient today.  Concerns regarding medicines are outlined above.  Orders Placed This Encounter  Procedures   ECHOCARDIOGRAM COMPLETE   No orders of the defined types were placed in this encounter.   Patient Instructions  Medication Instructions:   Your physician recommends  that you continue on your current medications as directed. Please refer to the Current Medication list given to you today.   *If you need a refill on your cardiac medications before your next appointment, please call your pharmacy*   Lab Work:  None ordered.  If  you have labs (blood work) drawn today and your tests are completely normal, you will receive your results only by: [place] (if you have MyChart) OR A paper copy in the mail If you have any lab test that is abnormal or we need to change your treatment, we will call you to review the results.   Testing/Procedures:  Your physician has requested that you have an echocardiogram. Echocardiography is a painless test that uses sound waves to create images of your heart. It provides your doctor with information about the size and shape of your heart and how well your heart's chambers and valves are working. This procedure takes approximately one hour. There are no restrictions for this procedure. Please do NOT wear cologne, perfume or lotions (deodorant is allowed). Please arrive 15 minutes prior to your appointment time.   Follow-Up: At [facility], you and your health needs are our priority.  As part of our continuing mission to provide you with exceptional heart care, we have created designated Provider Care Teams.  These Care Teams include your primary Cardiologist (physician) and Advanced Practice Providers (APPs -  Physician Assistants and Nurse Practitioners) who all work together to provide you with the care you need, when you need it.  We recommend signing up for the patient portal called "MyChart".  Sign up information is provided on this After Visit Summary.  MyChart is used to connect with patients for Virtual Visits (Telemedicine).  Patients are able to view lab/test results, encounter notes, upcoming appointments, etc.  Non-urgent messages can be sent to your provider as well.   To learn more about what you  can do with MyChart, go to [email or web address].    Your next appointment:   6 month(s)  Provider:   Lauree Chandler, MD     Other Instructions  Your physician wants you to follow-up in: 6 months with Dr. Angelena Form after the echo.  You will receive a reminder letter in the mail two months in advance. If you don't receive a letter, please call our office to schedule the follow-up appointment.     Signed, Emmaline Life, NP  07/29/2022 5:36 PM    [place]

## 2022-07-29 ENCOUNTER — Encounter: Payer: Self-pay | Admitting: Nurse Practitioner

## 2022-07-29 ENCOUNTER — Ambulatory Visit: Payer: Commercial Managed Care - PPO | Attending: Physician Assistant | Admitting: Nurse Practitioner

## 2022-07-29 VITALS — BP 128/64 | HR 73 | Ht 65.0 in | Wt 303.4 lb

## 2022-07-29 DIAGNOSIS — E781 Pure hyperglyceridemia: Secondary | ICD-10-CM | POA: Diagnosis not present

## 2022-07-29 DIAGNOSIS — I351 Nonrheumatic aortic (valve) insufficiency: Secondary | ICD-10-CM | POA: Diagnosis not present

## 2022-07-29 DIAGNOSIS — I712 Thoracic aortic aneurysm, without rupture, unspecified: Secondary | ICD-10-CM | POA: Diagnosis not present

## 2022-07-29 DIAGNOSIS — I2782 Chronic pulmonary embolism: Secondary | ICD-10-CM

## 2022-07-29 NOTE — Patient Instructions (Signed)
Medication Instructions:   Your physician recommends that you continue on your current medications as directed. Please refer to the Current Medication list given to you today.   *If you need a refill on your cardiac medications before your next appointment, please call your pharmacy*   Lab Work:  None ordered.  If you have labs (blood work) drawn today and your tests are completely normal, you will receive your results only by: [place] (if you have MyChart) OR A paper copy in the mail If you have any lab test that is abnormal or we need to change your treatment, we will call you to review the results.   Testing/Procedures:  Your physician has requested that you have an echocardiogram. Echocardiography is a painless test that uses sound waves to create images of your heart. It provides your doctor with information about the size and shape of your heart and how well your heart's chambers and valves are working. This procedure takes approximately one hour. There are no restrictions for this procedure. Please do NOT wear cologne, perfume or lotions (deodorant is allowed). Please arrive 15 minutes prior to your appointment time.   Follow-Up: At [facility], you and your health needs are our priority.  As part of our continuing mission to provide you with exceptional heart care, we have created designated Provider Care Teams.  These Care Teams include your primary Cardiologist (physician) and Advanced Practice Providers (APPs -  Physician Assistants and Nurse Practitioners) who all work together to provide you with the care you need, when you need it.  We recommend signing up for the patient portal called "MyChart".  Sign up information is provided on this After Visit Summary.  MyChart is used to connect with patients for Virtual Visits (Telemedicine).  Patients are able to view lab/test results, encounter notes, upcoming appointments, etc.  Non-urgent messages can be sent to  your provider as well.   To learn more about what you can do with MyChart, go to [email or web address].    Your next appointment:   6 month(s)  Provider:   Lauree Chandler, MD     Other Instructions  Your physician wants you to follow-up in: 6 months with Dr. Angelena Form after the echo.  You will receive a reminder letter in the mail two months in advance. If you don't receive a letter, please call our office to schedule the follow-up appointment.

## 2022-08-11 ENCOUNTER — Other Ambulatory Visit: Payer: Self-pay

## 2022-08-11 ENCOUNTER — Emergency Department (HOSPITAL_BASED_OUTPATIENT_CLINIC_OR_DEPARTMENT_OTHER): Payer: Commercial Managed Care - PPO

## 2022-08-11 ENCOUNTER — Encounter (HOSPITAL_BASED_OUTPATIENT_CLINIC_OR_DEPARTMENT_OTHER): Payer: Self-pay

## 2022-08-11 ENCOUNTER — Emergency Department (HOSPITAL_BASED_OUTPATIENT_CLINIC_OR_DEPARTMENT_OTHER)
Admission: EM | Admit: 2022-08-11 | Discharge: 2022-08-11 | Disposition: A | Discharge status: Home / Self Care | Payer: Commercial Managed Care - PPO | Attending: Emergency Medicine | Admitting: Emergency Medicine

## 2022-08-11 DIAGNOSIS — K7460 Unspecified cirrhosis of liver: Secondary | ICD-10-CM | POA: Insufficient documentation

## 2022-08-11 DIAGNOSIS — N189 Chronic kidney disease, unspecified: Secondary | ICD-10-CM | POA: Insufficient documentation

## 2022-08-11 DIAGNOSIS — R0789 Other chest pain: Secondary | ICD-10-CM | POA: Diagnosis not present

## 2022-08-11 DIAGNOSIS — R079 Chest pain, unspecified: Secondary | ICD-10-CM | POA: Diagnosis present

## 2022-08-11 DIAGNOSIS — E1122 Type 2 diabetes mellitus with diabetic chronic kidney disease: Secondary | ICD-10-CM | POA: Diagnosis not present

## 2022-08-11 DIAGNOSIS — Z7901 Long term (current) use of anticoagulants: Secondary | ICD-10-CM | POA: Diagnosis not present

## 2022-08-11 DIAGNOSIS — G2581 Restless legs syndrome: Secondary | ICD-10-CM | POA: Insufficient documentation

## 2022-08-11 DIAGNOSIS — Z7984 Long term (current) use of oral hypoglycemic drugs: Secondary | ICD-10-CM | POA: Diagnosis not present

## 2022-08-11 LAB — BASIC METABOLIC PANEL
Anion gap: 9 (ref 5–15)
BUN: 16 mg/dL (ref 6–20)
CO2: 26 mmol/L (ref 22–32)
Calcium: 8.7 mg/dL — ABNORMAL LOW (ref 8.9–10.3)
Chloride: 102 mmol/L (ref 98–111)
Creatinine, Ser: 1.12 mg/dL — ABNORMAL HIGH (ref 0.44–1.00)
GFR, Estimated: 57 mL/min — ABNORMAL LOW (ref >60)
Glucose, Bld: 133 mg/dL — ABNORMAL HIGH (ref 70–99)
Potassium: 4.1 mmol/L (ref 3.5–5.1)
Sodium: 137 mmol/L (ref 135–145)

## 2022-08-11 LAB — HEPATIC FUNCTION PANEL
ALT: 22 U/L (ref 0–44)
AST: 22 U/L (ref 15–41)
Albumin: 4 g/dL (ref 3.5–5.0)
Alkaline Phosphatase: 99 U/L (ref 38–126)
Bilirubin, Direct: <0.1 mg/dL (ref 0.0–0.2)
Total Bilirubin: 0.4 mg/dL (ref 0.3–1.2)
Total Protein: 7.6 g/dL (ref 6.5–8.1)

## 2022-08-11 LAB — CBC
HCT: 39.5 % (ref 36.0–46.0)
Hemoglobin: 12.1 g/dL (ref 12.0–15.0)
MCH: 27.3 pg (ref 26.0–34.0)
MCHC: 30.6 g/dL (ref 30.0–36.0)
MCV: 89.2 fL (ref 80.0–100.0)
Platelets: 243 10*3/uL (ref 150–400)
RBC: 4.43 MIL/uL (ref 3.87–5.11)
RDW: 15.1 % (ref 11.5–15.5)
WBC: 7.3 10*3/uL (ref 4.0–10.5)
nRBC: 0 % (ref 0.0–0.2)

## 2022-08-11 LAB — PROTIME-INR
INR: 1.1 (ref 0.8–1.2)
Prothrombin Time: 14.5 seconds (ref 11.4–15.2)

## 2022-08-11 LAB — TROPONIN I (HIGH SENSITIVITY)
Troponin I (High Sensitivity): 3 ng/L (ref <18)
Troponin I (High Sensitivity): 3 ng/L (ref <18)

## 2022-08-11 LAB — PREGNANCY, URINE: Preg Test, Ur: NEGATIVE

## 2022-08-11 MED ORDER — IOHEXOL 350 MG/ML SOLN
100.0000 mL | Freq: Once | INTRAVENOUS | Status: AC | PRN
  Administered 2022-08-11: 100 mL via INTRAVENOUS

## 2022-08-11 NOTE — ED Triage Notes (Signed)
Since 2300 last night has had intermittent chest pain. Left arm pain since Saturday.   Hx of PE, takes Xarelto. States does not feel similar.

## 2022-08-11 NOTE — ED Notes (Signed)
Called lab to add on Hepatic Function Panel and Protime-INR to previously collected labs

## 2022-08-11 NOTE — Discharge Instructions (Addendum)
Thank you for coming to [facility] Emergency Department. You were seen for chest pain. We did an exam, labs, and imaging, and these showed no acute findings. Your thoracic aneurysm was stable. You had no new PEs.  Please follow up with your primary care provider or cardiologist within 1 week.   Do not hesitate to return to the ED or call 911 if you experience: -Worsening symptoms -Lightheadedness, passing out -Fevers/chills -Anything else that concerns you

## 2022-08-11 NOTE — ED Provider Notes (Signed)
[facility] Provider Note   CSN: [id number] Arrival date & time: 08/11/22  1711     History {Add pertinent medical, surgical, social history, OB history to HPI:1} Chief Complaint  Patient presents with   Chest Pain    Erika Woods is a 57 y.o. female with recurrent PEs on xarelto, h/o thoracic aortic aneurysm, obesity, CKD, RLA, MDD, cirrhosis, who presents with chest pain. Patient had acute onset left sided chest pain that started last night, lasted 1h. Went to bed and woke up feeling okay. Has had the same pain repeated 2-3 times today that didn't last as long. No h/o similar. This didn't feel like prior history of PEs. Denies diaphoresis, palpitations, nausea/vomiting, SOB, cough, flu-like symptoms, leg swelling. Takes xarelto without any missed doses. Doesn't feel the pain currently.    Chest Pain      Home Medications Prior to Admission medications   Medication Sig Start Date End Date Taking? Authorizing Provider  acetaminophen (TYLENOL) 500 MG tablet Take 1,000-1,500 mg by mouth every 8 (eight) hours as needed for moderate pain.    [provider]  albuterol (VENTOLIN HFA) 108 (90 Base) MCG/ACT inhaler Inhale 2 puffs into the lungs every 6 (six) hours as needed for wheezing or shortness of breath.    [provider]  amoxicillin (AMOXIL) 500 MG capsule Take 500 mg by mouth as needed (before dental). 04/18/22   [provider]  Budeson-Glycopyrrol-Formoterol (BREZTRI AEROSPHERE) 160-9-4.8 MCG/ACT AERO Inhale into the lungs. 10/15/20   [provider]  cloNIDine (CATAPRES) 0.3 MG tablet Take 0.3 mg by mouth at bedtime.    [provider]  clotrimazole-betamethasone (LOTRISONE) cream Apply 1 Application topically 2 (two) times daily as needed (rash).    [provider]  dexlansoprazole (DEXILANT) 60 MG capsule Take 1 capsule (60 mg total) by mouth daily. 04/02/22   [name], MD   Dextromethorphan-buPROPion ER 45-105 MG TBCR Take 1 tablet by mouth in the morning and at bedtime.    [provider]  fluticasone (FLONASE) 50 MCG/ACT nasal spray Place 1 spray into both nostrils 2 (two) times daily as needed for allergies.    [provider]  Fluticasone-Umeclidin-Vilant (TRELEGY ELLIPTA) 200-62.5-25 MCG/ACT AEPB Inhale 1 puff into the lungs daily. Patient taking differently: Inhale 1 puff into the lungs daily as needed (shortness of breath). 07/19/21   Hunsucker, Bonna Gains, MD  gabapentin (NEURONTIN) 300 MG capsule Take 300-600 mg by mouth See admin instructions. Taking 2 capsules (600 mg) in the am and 300 mg at bedtime 04/01/20   [provider]  ipratropium (ATROVENT) 0.03 % nasal spray Place 2 sprays into both nostrils at bedtime as needed for allergies. 10/09/21   [provider]  lamoTRIgine (LAMICTAL) 150 MG tablet Take 150 mg by mouth daily after breakfast.  06/30/18   [provider]  metFORMIN (GLUCOPHAGE) 500 MG tablet Take 500 mg by mouth 2 (two) times daily with a meal.  10/28/13   [provider]  oxymetazoline (AFRIN) 0.05 % nasal spray Place 1 spray into both nostrils 2 (two) times daily as needed for congestion.    [provider]  polyethylene glycol powder (GLYCOLAX/MIRALAX) 17 GM/SCOOP powder Take by mouth. 11/12/20   [provider]  rivaroxaban (XARELTO) 20 MG TABS tablet Take 1 tablet (20 mg total) by mouth daily with supper. 03/13/21   Thurnell Lose, MD  rOPINIRole (REQUIP) 4 MG tablet Take 12 mg by mouth  at bedtime. 11/03/20   [provider]  Vitamin D, Ergocalciferol, (DRISDOL) 1.25 MG (50000 UNIT) CAPS capsule Take 50,000 Units by mouth every Monday. 05/25/21   [provider]      Allergies    Sulfa antibiotics and Keflex [cephalexin]    Review of Systems   Review of Systems  Cardiovascular:  Positive for chest pain.   Review of systems Negative for f/c.  A 10  point review of systems was performed and is negative unless otherwise reported in HPI.  Physical Exam Updated Vital Signs BP 138/79   Pulse 67   Temp 98.7 F (37.1 C)   Resp 13   Ht 5\' 5"  (1.651 m)   Wt 135.2 kg   LMP 01/29/2013   SpO2 97%   BMI 49.59 kg/m  Physical Exam General: Normal appearing female, lying in bed.  HEENT: PERRLA, Sclera anicteric, MMM, trachea midline.  Cardiology: RRR, no murmurs/rubs/gallops. BL radial and DP pulses equal bilaterally.  Resp: Normal respiratory rate and effort. CTAB, no wheezes, rhonchi, crackles.  Abd: Soft, non-tender, non-distended. No rebound tenderness or guarding.  GU: Deferred. MSK: No peripheral edema or signs of trauma. Extremities without deformity or TTP. No cyanosis or clubbing. Skin: warm, dry. No rashes or lesions. Back: No CVA tenderness Neuro: A&Ox4, CNs II-XII grossly intact. MAEs. Sensation grossly intact.  Psych: Normal mood and affect.   ED Results / Procedures / Treatments   Labs (all labs ordered are listed, but only abnormal results are displayed) Labs Reviewed  BASIC METABOLIC PANEL - Abnormal; Notable for the following components:      Result Value   Glucose, Bld 133 (*)    Creatinine, Ser 1.12 (*)    Calcium 8.7 (*)    GFR, Estimated 57 (*)    All other components within normal limits  CBC  HEPATIC FUNCTION PANEL  PROTIME-INR  PREGNANCY, URINE  TROPONIN I (HIGH SENSITIVITY)  TROPONIN I (HIGH SENSITIVITY)    EKG EKG Interpretation  Date/Time:  Monday August 11 2022 17:21:05 EDT Ventricular Rate:  68 PR Interval:  164 QRS Duration: 144 QT Interval:  407 QTC Calculation: 433 R Axis:   -69 Text Interpretation: Sinus rhythm RBBB and LAFB No significant change since last tracing Confirmed by Cindee Lame [phone]) on 08/11/2022 5:50:11 PM  Radiology CT Angio Chest PE W and/or Wo Contrast  Result Date: 08/11/2022 CLINICAL DATA:  Intermittent chest pain and left arm pain since Saturday, history of  pulmonary embolus, on anticoagulation EXAM: CT ANGIOGRAPHY CHEST WITH CONTRAST TECHNIQUE: Multidetector CT imaging of the chest was performed using the standard protocol during bolus administration of intravenous contrast. Multiplanar CT image reconstructions and MIPs were obtained to evaluate the vascular anatomy. RADIATION DOSE REDUCTION: This exam was performed according to the departmental dose-optimization program which includes automated exposure control, adjustment of the mA and/or kV according to patient size and/or use of iterative reconstruction technique. CONTRAST:  146mL OMNIPAQUE IOHEXOL 350 MG/ML SOLN COMPARISON:  06/20/2022 FINDINGS: Cardiovascular: This is a technically adequate evaluation of the pulmonary vasculature. There are no acute pulmonary emboli. Stable synechia within the right lower lobe pulmonary artery is consistent with sequela of chronic thromboembolic disease. Mild cardiomegaly without pericardial effusion. 4.3 cm ascending thoracic aortic aneurysm is again noted, not appreciably changed by my measurement. No evidence of dissection. Mediastinum/Nodes: No enlarged mediastinal, hilar, or axillary lymph nodes. Thyroid gland, trachea, and esophagus demonstrate no significant findings. Small hiatal hernia. Lungs/Pleura: Mosaic ground-glass attenuation throughout the lungs likely sequela  of hypoventilatory change or small airway disease. No dense consolidation, effusion, or pneumothorax. Central airways are patent. Upper Abdomen: No acute abnormality. Musculoskeletal: No acute or destructive bony lesions. Reconstructed images demonstrate no additional findings. Review of the MIP images confirms the above findings. IMPRESSION: 1. No evidence of acute pulmonary embolus. 2. Stable synechia within the right lower lobe pulmonary artery consistent with sequela of chronic thromboembolic disease. 3. Stable 4.3 cm ascending thoracic aortic aneurysm. Recommend annual imaging followup by CTA or MRA.  This recommendation follows 2010 ACCF/AHA/AATS/ACR/ASA/SCA/SCAI/SIR/STS/SVM Guidelines for the Diagnosis and Management of Patients with Thoracic Aortic Disease. Circulation. 2010; [id number]. Aortic aneurysm NOS (ICD10-I71.9) 4. Mosaic ground-glass attenuation throughout the lungs consistent with small airway disease or hypoventilatory change. No evidence of pneumonia. 5. Stable hiatal hernia. Electronically Signed   By: Randa Ngo M.D.   On: 08/11/2022 18:57   DG Chest 2 View  Result Date: 08/11/2022 CLINICAL DATA:  chest pain EXAM: CHEST - 2 VIEW COMPARISON:  05/06/2022 FINDINGS: Cardiac silhouette is unremarkable. No pneumothorax or pleural effusion. The lungs are clear. There are thoracic degenerative changes. IMPRESSION: No acute cardiopulmonary process. Electronically Signed   By: Sammie Bench M.D.   On: 08/11/2022 17:32    Procedures Procedures  {Document cardiac monitor, telemetry assessment procedure when appropriate:1}  Medications Ordered in ED Medications  iohexol (OMNIPAQUE) 350 MG/ML injection 100 mL (100 mLs Intravenous Contrast Given 08/11/22 1828)    ED Course/ Medical Decision Making/ A&P                          Medical Decision Making Amount and/or Complexity of Data Reviewed Labs: ordered. Decision-making details documented in ED Course. Radiology: ordered. Decision-making details documented in ED Course.  Risk Prescription drug management.    This patient presents to the ED for concern of CP, this involves an extensive number of treatment options, and is a complaint that carries with it a high risk of complications and morbidity.  I considered the following differential and admission for this acute, potentially life threatening condition.   MDM:    DDX for chest pain includes but is not limited to:  ACS/arrhythmia,  PE, aortic dissection, PNA, PTX, esophogeal rupture, biliary disease, pericarditis, GERD/PUD/gastritis, or musculoskeletal pain. EKG  without ischemic changes, will get trops. Patient cannot PERC out based on age, and patient has had multiple PEs; she states this didn't feel like prio\r PEs and hasn't missed any doses of her xarelto, but also has h/o thoracic aneurysm of the aorta, and will obtain CTA of the chest to evaluate both. No abdominal pain and no c/f biliary disease. Consider GERD, given known history.    Clinical Course as of 08/11/22 1916  Mon Aug 11, 2022  1903 Creatinine(!): 1.12 Mild elevation; BL 0.8-0.9 [HN]  1904 CBC wnl [HN]  1904 Hepatic function panel wnl [HN]  1904 Troponin I (High Sensitivity): 3 [HN]  1904 INR: 1.1 [HN]  1904 CT Angio Chest PE W and/or Wo Contrast 1. No evidence of acute pulmonary embolus. 2. Stable synechia within the right lower lobe pulmonary artery consistent with sequela of chronic thromboembolic disease. 3. Stable 4.3 cm ascending thoracic aortic aneurysm. Recommend annual imaging followup by CTA or MRA. This recommendation follows 2010 ACCF/AHA/AATS/ACR/ASA/SCA/SCAI/SIR/STS/SVM Guidelines for the Diagnosis and Management of Patients with Thoracic Aortic Disease. Circulation. 2010; [id number]. Aortic aneurysm NOS (ICD10-I71.9) 4. Mosaic ground-glass attenuation throughout the lungs consistent with small airway disease or hypoventilatory change. No  evidence of pneumonia. 5. Stable hiatal hernia.   [HN]  1904 DG Chest 2 View No acute cardiopulmonary process. [HN]    Clinical Course User Index [HN] Audley Hose, MD    Labs: I Ordered, and personally interpreted labs.  The pertinent results include:  those listed above  Imaging Studies ordered: I ordered imaging studies including CT PE, CXR I independently visualized and interpreted imaging. I agree with the radiologist interpretation  Additional history obtained from chart review, daughter at bedside.   Cardiac Monitoring: The patient was maintained on a cardiac monitor.  I personally viewed and  interpreted the cardiac monitored which showed an underlying rhythm of: NSR  Reevaluation: After the interventions noted above, I reevaluated the patient and found that they have :resolved  Social Determinants of Health: Patient lives independently   Disposition:  ***  Co morbidities that complicate the patient evaluation  Past Medical History:  Diagnosis Date   Acute pulmonary embolism 11/11/2018   Acute pulmonary embolism with acute cor pulmonale 09/16/2017   Acute superficial venous thrombosis of left lower extremity    Aortic insufficiency    Echocardiogram 08/2019: prob bicuspid AoV, mild to mod AI, mild AS (mean 12 mmHg), EF 55-60, no RWMA, Gr 1 DD, GLS -22.2%, normal RVSF, Ao Root 39 mm   Coronary CTA    Coronary CTA 08/2019: Calcium score 0, no evidence of CAD, ascending aorta 4 cm   Diabetes mellitus without complication    DM (diabetes mellitus) 08/18/2011   GERD (gastroesophageal reflux disease)    Heart murmur    History of kidney stones    Impaired fasting glucose    Mood disorder 08/18/2011   Obesity, Class III, BMI 40-49.9 (morbid obesity) 09/16/2017   Obstructive sleep apnea on CPAP    Osteoarthritis of knee    Personal history of pulmonary embolism    PTSD (post-traumatic stress disorder) 08/18/2011   Pulmonary emboli 09/15/2017   Renal disorder    kideny stones   Restless leg syndrome    Severe recurrent major depression without psychotic features 02/17/2018   Sleep apnea 08/18/2011   Thoracic aortic aneurysm    Thoracic aortic aneurysm without rupture    Urinary tract infection    hx of chronic uti per pt   UTI (urinary tract infection)    recurrent     Medicines Meds ordered this encounter  Medications   iohexol (OMNIPAQUE) 350 MG/ML injection 100 mL    I have reviewed the patients home medicines and have made adjustments as needed  Problem List / ED Course: Problem List Items Addressed This Visit   None        {Document critical care  time when appropriate:1} {Document review of labs and clinical decision tools ie heart score, Chads2Vasc2 etc:1}  {Document your independent review of radiology images, and any outside records:1} {Document your discussion with family members, caretakers, and with consultants:1} {Document social determinants of health affecting pt's care:1} {Document your decision making why or why not admission, treatments were needed:1}  This note was created using dictation software, which may contain spelling or grammatical errors.

## 2022-08-24 NOTE — Therapy (Signed)
OUTPATIENT PHYSICAL THERAPY LOWER EXTREMITY EVALUATION   Patient Name: Erika Woods MRN: [id number] DOB:06-24-1965, 57 y.o., female Today's Date: 08/26/2022  END OF SESSION:  PT End of Session - 08/26/22 1607     Visit Number 1    Number of Visits 4    Date for PT Re-Evaluation 10/07/22    Authorization Type uhc    PT Start Time 1400    PT Stop Time 1440    PT Time Calculation (min) 40 min    Activity Tolerance Patient tolerated treatment well    Behavior During Therapy WFL for tasks assessed/performed             Past Medical History:  Diagnosis Date   Acute pulmonary embolism 11/11/2018   Acute pulmonary embolism with acute cor pulmonale 09/16/2017   Acute superficial venous thrombosis of left lower extremity    Aortic insufficiency    Echocardiogram 08/2019: prob bicuspid AoV, mild to mod AI, mild AS (mean 12 mmHg), EF 55-60, no RWMA, Gr 1 DD, GLS -22.2%, normal RVSF, Ao Root 39 mm   Coronary CTA    Coronary CTA 08/2019: Calcium score 0, no evidence of CAD, ascending aorta 4 cm   Diabetes mellitus without complication    DM (diabetes mellitus) 08/18/2011   GERD (gastroesophageal reflux disease)    Heart murmur    History of kidney stones    Impaired fasting glucose    Mood disorder 08/18/2011   Obesity, Class III, BMI 40-49.9 (morbid obesity) 09/16/2017   Obstructive sleep apnea on CPAP    Osteoarthritis of knee    Personal history of pulmonary embolism    PTSD (post-traumatic stress disorder) 08/18/2011   Pulmonary emboli 09/15/2017   Renal disorder    kideny stones   Restless leg syndrome    Severe recurrent major depression without psychotic features 02/17/2018   Sleep apnea 08/18/2011   Thoracic aortic aneurysm    Thoracic aortic aneurysm without rupture    Urinary tract infection    hx of chronic uti per pt   UTI (urinary tract infection)    recurrent   Past Surgical History:  Procedure Laterality Date   arm surgery Bilateral Skin removal    DILATATION & CURETTAGE/HYSTEROSCOPY WITH MYOSURE N/A 06/11/2018   Procedure: DILATATION & CURETTAGE/HYSTEROSCOPY WITH MYOSURE RESECTION OF ENDOMETRIAL THICKENING;  Surgeon: Richardean Chimera, MD;  Location: [facility];  Service: Gynecology;  Laterality: N/A;  BMI 56   DILATION AND EVACUATION     ESOPHAGOGASTRODUODENOSCOPY (EGD) WITH PROPOFOL N/A 07/15/2018   Procedure: ESOPHAGOGASTRODUODENOSCOPY (EGD) WITH PROPOFOL;  Surgeon: Willis Modena, MD;  Location: [facility];  Service: Endoscopy;  Laterality: N/A;   ESOPHAGOGASTRODUODENOSCOPY (EGD) WITH PROPOFOL N/A 04/27/2019   Procedure: ESOPHAGOGASTRODUODENOSCOPY (EGD) WITH PROPOFOL;  Surgeon: Willis Modena, MD;  Location: [facility];  Service: Endoscopy;  Laterality: N/A;   ESOPHAGOGASTRODUODENOSCOPY (EGD) WITH PROPOFOL Bilateral 04/02/2022   Procedure: ESOPHAGOGASTRODUODENOSCOPY (EGD) WITH PROPOFOL;  Surgeon: Willis Modena, MD;  Location: [facility];  Service: Gastroenterology;  Laterality: Bilateral;   IR ABLATE LIVER CRYOABLATION  10/28/2019   IR ABLATE LIVER CRYOABLATION  05/07/2020   IR RADIOLOGIST EVAL & MGMT  10/14/2019   IR RADIOLOGIST EVAL & MGMT  04/03/2020   KIDNEY STONE SURGERY     KNEE SURGERY Right    LUMBAR LAMINECTOMY/DECOMPRESSION MICRODISCECTOMY N/A 11/08/2020   Procedure: LUMBAR FIVE AND SACRAL ONE LAMINECTOMY/DECOMPRESSION MICRODISCECTOMY 1 LEVEL;  Surgeon: Venita Lick, MD;  Location: [facility];  Service: Orthopedics;  Laterality: N/A;   TONSILLECTOMY  TOTAL KNEE ARTHROPLASTY Right 11/05/2021   Procedure: RIGHT TOTAL KNEE ARTHROPLASTY;  Surgeon: Marcene Corning, MD;  Location: [facility];  Service: Orthopedics;  Laterality: Right;   Patient Active Problem List   Diagnosis Date Noted   Restless leg syndrome 08/11/2022   Chronic kidney disease (CKD) 08/11/2022   Cirrhosis of liver 08/11/2022   Primary osteoarthritis of right knee 11/05/2021   Primary localized osteoarthritis of right knee 11/05/2021   Preop pulmonary/respiratory  exam 09/12/2021   Recurrent pulmonary emboli 02/18/2021   Hypoxemia 02/18/2021   Lumbar disc herniation 11/08/2020   Radiculopathy 11/05/2020   COVID-19 virus detected 06/07/2019   MDD (major depressive disorder) 04/18/2018   Severe recurrent major depression without psychotic features 02/17/2018   Obesity, Class III, BMI 40-49.9 (morbid obesity) 09/16/2017   Acute superficial venous thrombosis of left lower extremity    Thoracic aortic aneurysm without rupture    Pulmonary emboli 09/15/2017   PTSD (post-traumatic stress disorder) 08/18/2011   DM (diabetes mellitus) 08/18/2011   Sleep apnea 08/18/2011   Mood disorder ([name]) 08/18/2011    PCP: Dois Davenport, MD   REFERRING PROVIDER:   Terance Hart, MD    REFERRING DIAG: RIGHT FOOT STRESS INJURY, LEFT FOOT OA   THERAPY DIAG:  Pain in left foot  Pain in right foot  Difficulty in walking, not elsewhere classified  Rationale for Evaluation and Treatment: Rehabilitation  ONSET DATE: 6 weeks  SUBJECTIVE:   SUBJECTIVE STATEMENT: Right foot stress fx x 6 weeks ago no longer in boot.  Left foot OA. Wearing shoe insert on left and just had steroid shot top of left foot.  No R foot pain. Left foot does hurt. R TKR x 1 year. Good recovery knee is working well.  PERTINENT HISTORY: ER 08/11/22 Erika Woods is a 57 y.o. female with recurrent PEs on xarelto, h/o thoracic aortic aneurysm, obesity, CKD, RLA, MDD, cirrhosis, who presents with chest pain.  PAIN:  Are you having pain? Yes: NPRS scale: Current 4/10 worst 6/10; least 3/10 Pain location: Left foot Head of 2nd metatarsal and middle cuneiform Pain description: throb dull Aggravating factors: weight bearing; walking x 30 mins Relieving factors: sitting/lying  PRECAUTIONS: Fall  WEIGHT BEARING RESTRICTIONS: No  FALLS:  Has patient fallen in last 6 months? Yes. Number of falls tripped over small child  LIVING ENVIRONMENT: Lives with: lives with their  family Lives in: House/apartment Stairs: Yes: External: 4 steps; can reach both Has following equipment at home: None  OCCUPATION: Pre school teacher  PLOF: Independent  PATIENT GOALS: decrease pain  NEXT MD VISIT: as needed  OBJECTIVE:   DIAGNOSTIC FINDINGS: none in chart  PATIENT SURVEYS:  FOTO Primary measure 52% with goal of 66%  COGNITION: Overall cognitive status: Within functional limits for tasks assessed     SENSATION: WFL  EDEMA:  None  POSTURE: weight shift right  PALPATION: TTP right foot 2nd metatarsal head and cuneiform  LOWER EXTREMITY ROM:  Active ROM Right eval Left eval   Ankle dorsiflexion full full  Ankle plantarflexion    Ankle inversion full full  Ankle eversion full ff     (Blank rows = not tested)  LOWER EXTREMITY MMT:  MMT Right eval Left eval  Hip flexion    Hip extension    Hip abduction    Hip adduction    Hip internal rotation    Hip external rotation    Knee flexion    Knee extension    Ankle dorsiflexion  4 sl P! 5  Ankle plantarflexion 5 5  Ankle inversion    Ankle eversion     (Blank rows = not tested)                      2-4 digit resisted extension: 3+ limited by pain  FUNCTIONAL TESTS:  5 times sit to stand: 10.83 Timed up and go (TUG): 9.54  GAIT: Distance walked: 400 Assistive device utilized: None Level of assistance: Complete Independence Comments: Antalgic limp offloading left foot   TODAY'S TREATMENT:                                                                                                                              Eval Objective testing Pt edu   PATIENT EDUCATION:  Education details: Discussed eval findings, rehab rationale, aquatic program progression/POC and pools in area. Patient is in agreement  Person educated: Patient Education method: Explanation Education comprehension: verbalized understanding  HOME EXERCISE PROGRAM: TBA  ASSESSMENT:  CLINICAL  IMPRESSION: Patient is a 57 y.o. f who was seen today for physical therapy evaluation and treatment for RIGHT FOOT STRESS INJURY, LEFT FOOT OA .  She reports recovery from stress injury wearing a boot as instructed x 6 weeks with out further discomfort at this time. Left foot with moderate pain increases with increased weight bearing through day. 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  She reports being able  to push through pain most days with pain at end of day varying.  Her strength and ROM is WNL.  She does have pain at OA site with palpation and with 2-3rd digit resisted extension.  Her gait is slightly antalgic offloading towards right.  She will benefit from a brief series of aquatic therapy intervention to introduce and instruct on exercising and stretching using water properties to improve strength and ROM of bilat feet/ankle, relieve external forces to tolerate walking/gait train and other movements and improve general health.    OBJECTIVE IMPAIRMENTS: decreased strength, obesity, and pain.   ACTIVITY LIMITATIONS: standing, stairs, and locomotion level  PARTICIPATION LIMITATIONS: community activity and occupation  PERSONAL FACTORS: 1-2 comorbidities: morbid obesity, PE  are also affecting patient's functional outcome.   REHAB POTENTIAL: Good  CLINICAL DECISION MAKING: Evolving/moderate complexity  EVALUATION COMPLEXITY: Moderate   GOALS: Goals reviewed with patient? Yes  SHORT TERM GOALS: Target date: 5/28 Pt will tolerate full aquatic sessions consistently without increase in pain and with improving function to demonstrate good toleration and effectiveness of intervention.   Baseline: Goal status: INITIAL  2.  Pt will report no increase in left foot pain after work day/end of day Baseline: 6/10 Goal status: INITIAL  3.  Pt to meet stated Foto Goal of 66% Baseline: 52% Goal status: INITIAL  4.  Antalgic gait pattern to resolve to normal  pattern. Baseline: offloading right/antalgic Goal status: INITIAL  5.  Pt will be indep  with final aquatic HEP for continued management of condition . Baseline: none Goal status: INITIAL    LONG TERM GOALS: To be assigned going forward as needed  PLAN:  PT FREQUENCY: 1-2x/week  PT DURATION: 6 weeks 4 visits.  PLANNED INTERVENTIONS: Therapeutic exercises, Therapeutic activity, Neuromuscular re-education, Balance training, Gait training, Patient/Family education, Self Care, Joint mobilization, Stair training, DME instructions, Aquatic Therapy, Dry Needling, Electrical stimulation, Moist heat, Splintting, Taping, Ultrasound, Ionotophoresis 4mg /ml Dexamethasone, Manual therapy, and Re-evaluation  PLAN FOR NEXT SESSION: Aquatic therapy for strengthening and ROM bilat ankle/feet, knees. Gait training.   Corrie Dandy (Frankie) Omara Alcon MPT 08/26/2022, 4:22 PM

## 2022-08-26 ENCOUNTER — Other Ambulatory Visit: Payer: Self-pay

## 2022-08-26 ENCOUNTER — Encounter (HOSPITAL_BASED_OUTPATIENT_CLINIC_OR_DEPARTMENT_OTHER): Payer: Self-pay | Admitting: Physical Therapy

## 2022-08-26 ENCOUNTER — Ambulatory Visit (HOSPITAL_BASED_OUTPATIENT_CLINIC_OR_DEPARTMENT_OTHER): Payer: Commercial Managed Care - PPO | Attending: Orthopaedic Surgery | Admitting: Physical Therapy

## 2022-08-26 DIAGNOSIS — R262 Difficulty in walking, not elsewhere classified: Secondary | ICD-10-CM | POA: Diagnosis present

## 2022-08-26 DIAGNOSIS — M79671 Pain in right foot: Secondary | ICD-10-CM | POA: Diagnosis present

## 2022-08-26 DIAGNOSIS — M79672 Pain in left foot: Secondary | ICD-10-CM | POA: Insufficient documentation

## 2022-08-28 ENCOUNTER — Encounter (HOSPITAL_BASED_OUTPATIENT_CLINIC_OR_DEPARTMENT_OTHER): Payer: Self-pay | Admitting: Physical Therapy

## 2022-08-28 ENCOUNTER — Ambulatory Visit (HOSPITAL_BASED_OUTPATIENT_CLINIC_OR_DEPARTMENT_OTHER): Payer: Commercial Managed Care - PPO | Admitting: Physical Therapy

## 2022-08-28 DIAGNOSIS — M79672 Pain in left foot: Secondary | ICD-10-CM | POA: Diagnosis not present

## 2022-08-28 DIAGNOSIS — R262 Difficulty in walking, not elsewhere classified: Secondary | ICD-10-CM

## 2022-08-28 DIAGNOSIS — M79671 Pain in right foot: Secondary | ICD-10-CM

## 2022-08-28 NOTE — Therapy (Signed)
OUTPATIENT PHYSICAL THERAPY LOWER EXTREMITY EVALUATION   Patient Name: AMENA DOCKHAM MRN: [id number] DOB:13-Sep-1965, 57 y.o., female Today's Date: 08/28/2022  END OF SESSION:  PT End of Session - 08/28/22 1704     Visit Number 2    Number of Visits 4    Date for PT Re-Evaluation 10/07/22    Authorization Type uhc    PT Start Time 1702    PT Stop Time 1745    PT Time Calculation (min) 43 min    Activity Tolerance Patient tolerated treatment well    Behavior During Therapy WFL for tasks assessed/performed             Past Medical History:  Diagnosis Date   Acute pulmonary embolism 11/11/2018   Acute pulmonary embolism with acute cor pulmonale 09/16/2017   Acute superficial venous thrombosis of left lower extremity    Aortic insufficiency    Echocardiogram 08/2019: prob bicuspid AoV, mild to mod AI, mild AS (mean 12 mmHg), EF 55-60, no RWMA, Gr 1 DD, GLS -22.2%, normal RVSF, Ao Root 39 mm   Coronary CTA    Coronary CTA 08/2019: Calcium score 0, no evidence of CAD, ascending aorta 4 cm   Diabetes mellitus without complication    DM (diabetes mellitus) 08/18/2011   GERD (gastroesophageal reflux disease)    Heart murmur    History of kidney stones    Impaired fasting glucose    Mood disorder 08/18/2011   Obesity, Class III, BMI 40-49.9 (morbid obesity) 09/16/2017   Obstructive sleep apnea on CPAP    Osteoarthritis of knee    Personal history of pulmonary embolism    PTSD (post-traumatic stress disorder) 08/18/2011   Pulmonary emboli 09/15/2017   Renal disorder    kideny stones   Restless leg syndrome    Severe recurrent major depression without psychotic features 02/17/2018   Sleep apnea 08/18/2011   Thoracic aortic aneurysm    Thoracic aortic aneurysm without rupture    Urinary tract infection    hx of chronic uti per pt   UTI (urinary tract infection)    recurrent   Past Surgical History:  Procedure Laterality Date   arm surgery Bilateral Skin removal    DILATATION & CURETTAGE/HYSTEROSCOPY WITH MYOSURE N/A 06/11/2018   Procedure: DILATATION & CURETTAGE/HYSTEROSCOPY WITH MYOSURE RESECTION OF ENDOMETRIAL THICKENING;  Surgeon: Richardean Chimera, MD;  Location: [facility];  Service: Gynecology;  Laterality: N/A;  BMI 56   DILATION AND EVACUATION     ESOPHAGOGASTRODUODENOSCOPY (EGD) WITH PROPOFOL N/A 07/15/2018   Procedure: ESOPHAGOGASTRODUODENOSCOPY (EGD) WITH PROPOFOL;  Surgeon: Willis Modena, MD;  Location: [facility];  Service: Endoscopy;  Laterality: N/A;   ESOPHAGOGASTRODUODENOSCOPY (EGD) WITH PROPOFOL N/A 04/27/2019   Procedure: ESOPHAGOGASTRODUODENOSCOPY (EGD) WITH PROPOFOL;  Surgeon: Willis Modena, MD;  Location: [facility];  Service: Endoscopy;  Laterality: N/A;   ESOPHAGOGASTRODUODENOSCOPY (EGD) WITH PROPOFOL Bilateral 04/02/2022   Procedure: ESOPHAGOGASTRODUODENOSCOPY (EGD) WITH PROPOFOL;  Surgeon: Willis Modena, MD;  Location: [facility];  Service: Gastroenterology;  Laterality: Bilateral;   IR ABLATE LIVER CRYOABLATION  10/28/2019   IR ABLATE LIVER CRYOABLATION  05/07/2020   IR RADIOLOGIST EVAL & MGMT  10/14/2019   IR RADIOLOGIST EVAL & MGMT  04/03/2020   KIDNEY STONE SURGERY     KNEE SURGERY Right    LUMBAR LAMINECTOMY/DECOMPRESSION MICRODISCECTOMY N/A 11/08/2020   Procedure: LUMBAR FIVE AND SACRAL ONE LAMINECTOMY/DECOMPRESSION MICRODISCECTOMY 1 LEVEL;  Surgeon: Venita Lick, MD;  Location: [facility];  Service: Orthopedics;  Laterality: N/A;   TONSILLECTOMY  TOTAL KNEE ARTHROPLASTY Right 11/05/2021   Procedure: RIGHT TOTAL KNEE ARTHROPLASTY;  Surgeon: Marcene Corning, MD;  Location: [facility];  Service: Orthopedics;  Laterality: Right;   Patient Active Problem List   Diagnosis Date Noted   Restless leg syndrome 08/11/2022   Chronic kidney disease (CKD) 08/11/2022   Cirrhosis of liver 08/11/2022   Primary osteoarthritis of right knee 11/05/2021   Primary localized osteoarthritis of right knee 11/05/2021   Preop pulmonary/respiratory  exam 09/12/2021   Recurrent pulmonary emboli 02/18/2021   Hypoxemia 02/18/2021   Lumbar disc herniation 11/08/2020   Radiculopathy 11/05/2020   COVID-19 virus detected 06/07/2019   MDD (major depressive disorder) 04/18/2018   Severe recurrent major depression without psychotic features 02/17/2018   Obesity, Class III, BMI 40-49.9 (morbid obesity) 09/16/2017   Acute superficial venous thrombosis of left lower extremity    Thoracic aortic aneurysm without rupture    Pulmonary emboli 09/15/2017   PTSD (post-traumatic stress disorder) 08/18/2011   DM (diabetes mellitus) 08/18/2011   Sleep apnea 08/18/2011   Mood disorder ([name]) 08/18/2011    PCP: Dois Davenport, MD   REFERRING PROVIDER:   Terance Hart, MD    REFERRING DIAG: RIGHT FOOT STRESS INJURY, LEFT FOOT OA   THERAPY DIAG:  Pain in left foot  Pain in right foot  Difficulty in walking, not elsewhere classified  Rationale for Evaluation and Treatment: Rehabilitation  ONSET DATE: 6 weeks  SUBJECTIVE:   SUBJECTIVE STATEMENT: My left foot hurts today because I have been on it all day.  Right foot stress fx x 6 weeks ago no longer in boot.  Left foot OA. Wearing shoe insert on left and just had steroid shot top of left foot.  No R foot pain. Left foot does hurt. R TKR x 1 year. Good recovery knee is working well.  PERTINENT HISTORY: ER 08/11/22 DENNICE TINDOL is a 57 y.o. female with recurrent PEs on xarelto, h/o thoracic aortic aneurysm, obesity, CKD, RLA, MDD, cirrhosis, who presents with chest pain.  PAIN:  Are you having pain? Yes: NPRS scale: Current 3/10 worst 6/10; least 3/10 Pain location: Left foot Head of 2nd metatarsal and middle cuneiform Pain description: throb dull Aggravating factors: weight bearing; walking x 30 mins Relieving factors: sitting/lying  PRECAUTIONS: Fall  WEIGHT BEARING RESTRICTIONS: No  FALLS:  Has patient fallen in last 6 months? Yes. Number of falls tripped over small  child  LIVING ENVIRONMENT: Lives with: lives with their family Lives in: House/apartment Stairs: Yes: External: 4 steps; can reach both Has following equipment at home: None  OCCUPATION: Pre school teacher  PLOF: Independent  PATIENT GOALS: decrease pain  NEXT MD VISIT: as needed  OBJECTIVE:   DIAGNOSTIC FINDINGS: none in chart  PATIENT SURVEYS:  FOTO Primary measure 52% with goal of 66%  COGNITION: Overall cognitive status: Within functional limits for tasks assessed     SENSATION: WFL  EDEMA:  None  POSTURE: weight shift right  PALPATION: TTP right foot 2nd metatarsal head and cuneiform  LOWER EXTREMITY ROM:  Active ROM Right eval Left eval   Ankle dorsiflexion full full  Ankle plantarflexion    Ankle inversion full full  Ankle eversion full ff     (Blank rows = not tested)  LOWER EXTREMITY MMT:  MMT Right eval Left eval  Hip flexion    Hip extension    Hip abduction    Hip adduction    Hip internal rotation    Hip external rotation  Knee flexion    Knee extension    Ankle dorsiflexion 4 sl P! 5  Ankle plantarflexion 5 5  Ankle inversion    Ankle eversion     (Blank rows = not tested)                      2-4 digit resisted extension: 3+ limited by pain  FUNCTIONAL TESTS:  5 times sit to stand: 10.83 Timed up and go (TUG): 9.54  GAIT: Distance walked: 400 Assistive device utilized: None Level of assistance: Complete Independence Comments: Antalgic limp offloading left foot   TODAY'S TREATMENT:                                                                                                                              Pt seen for aquatic therapy today.  Treatment took place in water 3.5-4.75 ft in depth at the [facility] pool. Temp of water was 91.  Pt entered/exited the pool via steps using step to pattern with bilat hand rail.  *intro to setting *walking forward, backward and side stepping. *seated R/L ankle foot  alphabet *Cycling with ankle df; add/abd with ankle PF Toe raises; heel raises; ankle eversion x12-15 3.6 ft *toe walking x 2 widths 4.6 ft; heel walking x 4 widths *standing ue on wall 3.6 ft: squats weight through heels *Step ups leading R/L x 10 then x 5 ea. Cues for weight through heels *Cycling on noodle; add/abd  Pt requires the buoyancy and hydrostatic pressure of water for support, and to offload joints by unweighting joint load by at least 50 % in navel deep water and by at least 75-80% in chest to neck deep water.  Viscosity of the water is needed for resistance of strengthening. Water current perturbations provides challenge to standing balance requiring increased core activation.     PATIENT EDUCATION:  Education details: Discussed eval findings, rehab rationale, aquatic program progression/POC and pools in area. Patient is in agreement  Person educated: Patient Education method: Explanation Education comprehension: verbalized understanding  HOME EXERCISE PROGRAM: TBA  ASSESSMENT:  CLINICAL IMPRESSION: Pt demonstrates safety and indep in setting with therapist instructing from deck  She arrives with low level pain but reports being on feet for an extended time today.  She is directed through ankle and foot ROM and strengthening.  She does not have any discomfort submerged and a full reduction in pain to 0/10.  She does report left gastoc cramping which is resolved with warm water massage (unbilled).  Goals ongoing.  Initial impression: Patient is a 57 y.o. f who was seen today for physical therapy evaluation and treatment for RIGHT FOOT STRESS INJURY, LEFT FOOT OA .  She reports recovery from stress injury wearing a boot as instructed x 6 weeks with out further discomfort at this time. Left foot with moderate pain increases with increased weight bearing through day. 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  She reports being able  to push through pain most days  with pain at end of day varying.  Her strength and ROM is WNL.  She does have pain at OA site with palpation and with 2-3rd digit resisted extension.  Her gait is slightly antalgic offloading towards right.  She will benefit from a brief series of aquatic therapy intervention to introduce and instruct on exercising and stretching using water properties to improve strength and ROM of bilat feet/ankle, relieve external forces to tolerate walking/gait train and other movements and improve general health.    OBJECTIVE IMPAIRMENTS: decreased strength, obesity, and pain.   ACTIVITY LIMITATIONS: standing, stairs, and locomotion level  PARTICIPATION LIMITATIONS: community activity and occupation  PERSONAL FACTORS: 1-2 comorbidities: morbid obesity, PE  are also affecting patient's functional outcome.   REHAB POTENTIAL: Good  CLINICAL DECISION MAKING: Evolving/moderate complexity  EVALUATION COMPLEXITY: Moderate   GOALS: Goals reviewed with patient? Yes  SHORT TERM GOALS: Target date: 5/28 Pt will tolerate full aquatic sessions consistently without increase in pain and with improving function to demonstrate good toleration and effectiveness of intervention.   Baseline: Goal status: INITIAL  2.  Pt will report no increase in left foot pain after work day/end of day Baseline: 6/10 Goal status: INITIAL  3.  Pt to meet stated Foto Goal of 66% Baseline: 52% Goal status: INITIAL  4.  Antalgic gait pattern to resolve to normal pattern. Baseline: offloading right/antalgic Goal status: INITIAL  5.  Pt will be indep with final aquatic HEP for continued management of condition . Baseline: none Goal status: INITIAL    LONG TERM GOALS: To be assigned going forward as needed  PLAN:  PT FREQUENCY: 1-2x/week  PT DURATION: 6 weeks 4 visits.  PLANNED INTERVENTIONS: Therapeutic exercises, Therapeutic activity, Neuromuscular re-education, Balance training, Gait training, Patient/Family  education, Self Care, Joint mobilization, Stair training, DME instructions, Aquatic Therapy, Dry Needling, Electrical stimulation, Moist heat, Splintting, Taping, Ultrasound, Ionotophoresis /ml Dexamethasone, Manual therapy, and Re-evaluation  PLAN FOR NEXT SESSION: Aquatic therapy for strengthening and ROM bilat ankle/feet, knees. Gait training.   Areyana Leoni (Frankie) Cherryl Babin MPT 08/28/2022, 5:07 PM

## 2022-09-01 ENCOUNTER — Other Ambulatory Visit: Payer: Self-pay | Admitting: Gastroenterology

## 2022-09-01 DIAGNOSIS — K7469 Other cirrhosis of liver: Secondary | ICD-10-CM

## 2022-09-04 ENCOUNTER — Encounter (HOSPITAL_BASED_OUTPATIENT_CLINIC_OR_DEPARTMENT_OTHER): Payer: Self-pay | Admitting: Physical Therapy

## 2022-09-04 ENCOUNTER — Ambulatory Visit (HOSPITAL_BASED_OUTPATIENT_CLINIC_OR_DEPARTMENT_OTHER): Payer: Commercial Managed Care - PPO | Admitting: Physical Therapy

## 2022-09-04 DIAGNOSIS — M79672 Pain in left foot: Secondary | ICD-10-CM | POA: Diagnosis not present

## 2022-09-04 DIAGNOSIS — R262 Difficulty in walking, not elsewhere classified: Secondary | ICD-10-CM

## 2022-09-04 DIAGNOSIS — M79671 Pain in right foot: Secondary | ICD-10-CM

## 2022-09-04 NOTE — Therapy (Signed)
OUTPATIENT PHYSICAL THERAPY LOWER EXTREMITY TREATMENT   Patient Name: Erika Woods MRN: [id number] DOB:[birth year], 57 y.o., female Today's Date: 09/04/2022  END OF SESSION:  PT End of Session - 09/04/22 1609     Visit Number 3    Number of Visits 4    Date for PT Re-Evaluation 10/07/22    Authorization Type uhc    PT Start Time 1606    PT Stop Time 1645    PT Time Calculation (min) 39 min    Behavior During Therapy WFL for tasks assessed/performed             Past Medical History:  Diagnosis Date   Acute pulmonary embolism 11/11/2018   Acute pulmonary embolism with acute cor pulmonale 09/16/2017   Acute superficial venous thrombosis of left lower extremity    Aortic insufficiency    Echocardiogram 08/2019: prob bicuspid AoV, mild to mod AI, mild AS (mean 12 mmHg), EF 55-60, no RWMA, Gr 1 DD, GLS -22.2%, normal RVSF, Ao Root 39 mm   Coronary CTA    Coronary CTA 08/2019: Calcium score 0, no evidence of CAD, ascending aorta 4 cm   Diabetes mellitus without complication    DM (diabetes mellitus) 08/18/2011   GERD (gastroesophageal reflux disease)    Heart murmur    History of kidney stones    Impaired fasting glucose    Mood disorder 08/18/2011   Obesity, Class III, BMI 40-49.9 (morbid obesity) 09/16/2017   Obstructive sleep apnea on CPAP    Osteoarthritis of knee    Personal history of pulmonary embolism    PTSD (post-traumatic stress disorder) 08/18/2011   Pulmonary emboli 09/15/2017   Renal disorder    kideny stones   Restless leg syndrome    Severe recurrent major depression without psychotic features 02/17/2018   Sleep apnea 08/18/2011   Thoracic aortic aneurysm    Thoracic aortic aneurysm without rupture    Urinary tract infection    hx of chronic uti per pt   UTI (urinary tract infection)    recurrent   Past Surgical History:  Procedure Laterality Date   arm surgery Bilateral Skin removal   DILATATION & CURETTAGE/HYSTEROSCOPY WITH MYOSURE N/A 06/11/2018    Procedure: DILATATION & CURETTAGE/HYSTEROSCOPY WITH MYOSURE RESECTION OF ENDOMETRIAL THICKENING;  Surgeon: Richardean Chimera, MD;  Location: [facility];  Service: Gynecology;  Laterality: N/A;  BMI 56   DILATION AND EVACUATION     ESOPHAGOGASTRODUODENOSCOPY (EGD) WITH PROPOFOL N/A 07/15/2018   Procedure: ESOPHAGOGASTRODUODENOSCOPY (EGD) WITH PROPOFOL;  Surgeon: Willis Modena, MD;  Location: [facility];  Service: Endoscopy;  Laterality: N/A;   ESOPHAGOGASTRODUODENOSCOPY (EGD) WITH PROPOFOL N/A 04/27/2019   Procedure: ESOPHAGOGASTRODUODENOSCOPY (EGD) WITH PROPOFOL;  Surgeon: Willis Modena, MD;  Location: [facility];  Service: Endoscopy;  Laterality: N/A;   ESOPHAGOGASTRODUODENOSCOPY (EGD) WITH PROPOFOL Bilateral 04/02/2022   Procedure: ESOPHAGOGASTRODUODENOSCOPY (EGD) WITH PROPOFOL;  Surgeon: Willis Modena, MD;  Location: [facility];  Service: Gastroenterology;  Laterality: Bilateral;   IR ABLATE LIVER CRYOABLATION  10/28/2019   IR ABLATE LIVER CRYOABLATION  05/07/2020   IR RADIOLOGIST EVAL & MGMT  10/14/2019   IR RADIOLOGIST EVAL & MGMT  04/03/2020   KIDNEY STONE SURGERY     KNEE SURGERY Right    LUMBAR LAMINECTOMY/DECOMPRESSION MICRODISCECTOMY N/A 11/08/2020   Procedure: LUMBAR FIVE AND SACRAL ONE LAMINECTOMY/DECOMPRESSION MICRODISCECTOMY 1 LEVEL;  Surgeon: Venita Lick, MD;  Location: [facility];  Service: Orthopedics;  Laterality: N/A;   TONSILLECTOMY     TOTAL KNEE ARTHROPLASTY Right 11/05/2021  Procedure: RIGHT TOTAL KNEE ARTHROPLASTY;  Surgeon: Marcene Corning, MD;  Location: [facility];  Service: Orthopedics;  Laterality: Right;   Patient Active Problem List   Diagnosis Date Noted   Restless leg syndrome 08/11/2022   Chronic kidney disease (CKD) 08/11/2022   Cirrhosis of liver 08/11/2022   Primary osteoarthritis of right knee 11/05/2021   Primary localized osteoarthritis of right knee 11/05/2021   Preop pulmonary/respiratory exam 09/12/2021   Recurrent pulmonary emboli 02/18/2021    Hypoxemia 02/18/2021   Lumbar disc herniation 11/08/2020   Radiculopathy 11/05/2020   COVID-19 virus detected 06/07/2019   MDD (major depressive disorder) 04/18/2018   Severe recurrent major depression without psychotic features 02/17/2018   Obesity, Class III, BMI 40-49.9 (morbid obesity) 09/16/2017   Acute superficial venous thrombosis of left lower extremity    Thoracic aortic aneurysm without rupture    Pulmonary emboli 09/15/2017   PTSD (post-traumatic stress disorder) 08/18/2011   DM (diabetes mellitus) 08/18/2011   Sleep apnea 08/18/2011   Mood disorder ([name]) 08/18/2011    PCP: Dois Davenport, MD   REFERRING PROVIDER:   Terance Hart, MD    REFERRING DIAG: RIGHT FOOT STRESS INJURY, LEFT FOOT OA   THERAPY DIAG:  Pain in left foot  Pain in right foot  Difficulty in walking, not elsewhere classified  Rationale for Evaluation and Treatment: Rehabilitation  ONSET DATE: 6 weeks  SUBJECTIVE:   SUBJECTIVE STATEMENT: "My feet hurt today because I have been on them all day [place] a lot of uneven ground".   Right foot stress fx x 6 weeks ago no longer in boot.  Left foot OA. Wearing shoe insert on left and just had steroid shot top of left foot.  No R foot pain. Left foot does hurt. R TKR x 1 year. Good recovery knee is working well.  PERTINENT HISTORY: ER 08/11/22 CATRENA VARI is a 57 y.o. female with recurrent PEs on xarelto, h/o thoracic aortic aneurysm, obesity, CKD, RLA, MDD, cirrhosis, who presents with chest pain.  PAIN:  Are you having pain? Yes: NPRS scale: Current 5/10 Pain location: Lt dorsal surface (2nd metatarsal); Rt lateral foot  Pain description: throb dull Aggravating factors: weight bearing; walking x 30 mins Relieving factors: sitting/lying  PRECAUTIONS: Fall  WEIGHT BEARING RESTRICTIONS: No  FALLS:  Has patient fallen in last 6 months? Yes. Number of falls tripped over small child  LIVING  ENVIRONMENT: Lives with: lives with their family Lives in: House/apartment Stairs: Yes: External: 4 steps; can reach both Has following equipment at home: None  OCCUPATION: Pre school teacher  PLOF: Independent  PATIENT GOALS: decrease pain  NEXT MD VISIT: as needed  OBJECTIVE:   DIAGNOSTIC FINDINGS: none in chart  PATIENT SURVEYS:  FOTO Primary measure 52% with goal of 66%  COGNITION: Overall cognitive status: Within functional limits for tasks assessed     SENSATION: WFL  EDEMA:  None  POSTURE: weight shift right  PALPATION: TTP right foot 2nd metatarsal head and cuneiform  LOWER EXTREMITY ROM:  Active ROM Right eval Left eval   Ankle dorsiflexion full full  Ankle plantarflexion    Ankle inversion full full  Ankle eversion full ff     (Blank rows = not tested)  LOWER EXTREMITY MMT:  MMT Right eval Left eval  Hip flexion    Hip extension    Hip abduction    Hip adduction    Hip internal rotation    Hip external rotation  Knee flexion    Knee extension    Ankle dorsiflexion 4 sl P! 5  Ankle plantarflexion 5 5  Ankle inversion    Ankle eversion     (Blank rows = not tested)                      2-4 digit resisted extension: 3+ limited by pain  FUNCTIONAL TESTS:  5 times sit to stand: 10.83 Timed up and go (TUG): 9.54  GAIT: Distance walked: 400 Assistive device utilized: None Level of assistance: Complete Independence Comments: Antalgic limp offloading left foot   TODAY'S TREATMENT:                                                                                                                              Pt seen for aquatic therapy today.  Treatment took place in water 3.5-4.75 ft in depth at the [facility] pool. Temp of water was 91.  Pt entered/exited the pool via steps using step to pattern with bilat hand rail.  *walking forward, backward and side stepping without support, cues to roll thru the feet *seated R/L ankle  windshield wipers, circles CW/CCW, foot alphabet * marching forward / backward  * heel toe raises with bilat feet inv/ eversion (moving to L, then to R- no pain) * heel walking / toe walking - alternating across width of pool with yellow noodle under arms (heel walking incr Lt ant ankle pain) * Soleus stretch at wall - cues to not bounce and to hold 15s;  repeated at stairs with heel off of step and UE on rails  *Cycling with ankle df/PF  * when pt dried off:  applied I strip of sensitive skin rock tape on dorsum of Lt foot over 2nd metatarsal and perpendicular strip across it with 20% stretch.   2 - 1 strips applied to plantar surface from heel to metatarsal heads and one strip perpendicular at mid-arch with 50% stretch, for support.   Pt requires the buoyancy and hydrostatic pressure of water for support, and to offload joints by unweighting joint load by at least 50 % in navel deep water and by at least 75-80% in chest to neck deep water.  Viscosity of the water is needed for resistance of strengthening. Water current perturbations provides challenge to standing balance requiring increased core activation.     PATIENT EDUCATION:  Education details:aquatic therapy progressions and modifications  Person educated: Patient Education method: Explanation Education comprehension: verbalized understanding  HOME EXERCISE PROGRAM: TBA  ASSESSMENT:  CLINICAL IMPRESSION: Pt reported gradual reduction of bilat foot pain while in water.  She reported increased discomfort with heel walking; reduced with change in exercise.  She reported discomfort in Lt ant ankle when attempting to stretch soleus at wall; was able to feel foot flexor stretch with flexed knee and heel off of step without the discomfort in ant ankle. Verbally added the Lt soleus stretch to her  HEP.   Trial of sensitive skin Rock tape to dorsum of Lt foot (at area of localized swelling for decompression of tissue) and to arch of Lt foot  for support.   Therapist to assess goals; next visit is last authorized visit in POC and will need recert for more visits.      Initial impression: Patient is a 58 y.o. f who was seen today for physical therapy evaluation and treatment for RIGHT FOOT STRESS INJURY, LEFT FOOT OA .  She reports recovery from stress injury wearing a boot as instructed x 6 weeks with out further discomfort at this time. Left foot with moderate pain increases with increased weight bearing through day. 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  She reports being able  to push through pain most days with pain at end of day varying.  Her strength and ROM is WNL.  She does have pain at OA site with palpation and with 2-3rd digit resisted extension.  Her gait is slightly antalgic offloading towards right.  She will benefit from a brief series of aquatic therapy intervention to introduce and instruct on exercising and stretching using water properties to improve strength and ROM of bilat feet/ankle, relieve external forces to tolerate walking/gait train and other movements and improve general health.    OBJECTIVE IMPAIRMENTS: decreased strength, obesity, and pain.   ACTIVITY LIMITATIONS: standing, stairs, and locomotion level  PARTICIPATION LIMITATIONS: community activity and occupation  PERSONAL FACTORS: 1-2 comorbidities: morbid obesity, PE  are also affecting patient's functional outcome.   REHAB POTENTIAL: Good  CLINICAL DECISION MAKING: Evolving/moderate complexity  EVALUATION COMPLEXITY: Moderate   GOALS: Goals reviewed with patient? Yes  SHORT TERM GOALS: Target date: 5/28 Pt will tolerate full aquatic sessions consistently without increase in pain and with improving function to demonstrate good toleration and effectiveness of intervention.   Baseline: Goal status: INITIAL  2.  Pt will report no increase in left foot pain after work day/end of day Baseline: 6/10 Goal status: INITIAL  3.   Pt to meet stated Foto Goal of 66% Baseline: 52% Goal status: INITIAL  4.  Antalgic gait pattern to resolve to normal pattern. Baseline: offloading right/antalgic Goal status: INITIAL  5.  Pt will be indep with final aquatic HEP for continued management of condition . Baseline: none Goal status: INITIAL    LONG TERM GOALS: To be assigned going forward as needed  PLAN:  PT FREQUENCY: 1-2x/week  PT DURATION: 6 weeks 4 visits.  PLANNED INTERVENTIONS: Therapeutic exercises, Therapeutic activity, Neuromuscular re-education, Balance training, Gait training, Patient/Family education, Self Care, Joint mobilization, Stair training, DME instructions, Aquatic Therapy, Dry Needling, Electrical stimulation, Moist heat, Splintting, Taping, Ultrasound, Ionotophoresis /ml Dexamethasone, Manual therapy, and Re-evaluation  PLAN FOR NEXT SESSION: Aquatic therapy for strengthening and ROM bilat ankle/feet, knees. Gait training.  Mayer Camel, PTA 09/04/22 6:09 PM [facility] [address] [place], [place], [address] Phone: [phone]   Fax:  [phone]

## 2022-09-18 ENCOUNTER — Other Ambulatory Visit: Payer: Self-pay | Admitting: Gastroenterology

## 2022-09-24 ENCOUNTER — Encounter (HOSPITAL_BASED_OUTPATIENT_CLINIC_OR_DEPARTMENT_OTHER): Payer: Self-pay | Admitting: Physical Therapy

## 2022-09-24 ENCOUNTER — Ambulatory Visit (HOSPITAL_BASED_OUTPATIENT_CLINIC_OR_DEPARTMENT_OTHER): Payer: Commercial Managed Care - PPO | Attending: Orthopaedic Surgery | Admitting: Physical Therapy

## 2022-09-24 DIAGNOSIS — M79671 Pain in right foot: Secondary | ICD-10-CM | POA: Insufficient documentation

## 2022-09-24 DIAGNOSIS — M79672 Pain in left foot: Secondary | ICD-10-CM

## 2022-09-24 DIAGNOSIS — R262 Difficulty in walking, not elsewhere classified: Secondary | ICD-10-CM | POA: Insufficient documentation

## 2022-09-24 NOTE — Therapy (Signed)
OUTPATIENT PHYSICAL THERAPY LOWER EXTREMITY TREATMENT Re-cert  Patient Name: Erika Woods MRN: [id number] DOB:August 04, 1965, 57 y.o., female Today's Date: 09/24/2022  END OF SESSION:  PT End of Session - 09/24/22 1733     Visit Number 4    Number of Visits 8    Date for PT Re-Evaluation 10/24/22    Authorization Type uhc    PT Start Time 0520    PT Stop Time 0600    PT Time Calculation (min) 40 min    Activity Tolerance Patient tolerated treatment well    Behavior During Therapy [facility] for tasks assessed/performed             Past Medical History:  Diagnosis Date   Acute pulmonary embolism ([name]) 11/11/2018   Acute pulmonary embolism with acute cor pulmonale ([name]) 09/16/2017   Acute superficial venous thrombosis of left lower extremity    Aortic insufficiency    Echocardiogram 08/2019: prob bicuspid AoV, mild to mod AI, mild AS (mean 12 mmHg), EF 55-60, no RWMA, Gr 1 DD, GLS -22.2%, normal RVSF, Ao Root 39 mm   Coronary CTA    Coronary CTA 08/2019: Calcium score 0, no evidence of CAD, ascending aorta 4 cm   Diabetes mellitus without complication ([name])    DM (diabetes mellitus) ([name]) 08/18/2011   GERD (gastroesophageal reflux disease)    Heart murmur    History of kidney stones    Impaired fasting glucose    Mood disorder ([name]) 08/18/2011   Obesity, Class III, BMI 40-49.9 (morbid obesity) ([name]) 09/16/2017   Obstructive sleep apnea on CPAP    Osteoarthritis of knee    Personal history of pulmonary embolism    PTSD (post-traumatic stress disorder) 08/18/2011   Pulmonary emboli ([name]) 09/15/2017   Renal disorder    kideny stones   Restless leg syndrome    Severe recurrent major depression without psychotic features ([name]) 02/17/2018   Sleep apnea 08/18/2011   Thoracic aortic aneurysm ([name])    Thoracic aortic aneurysm without rupture ([name])    Urinary tract infection    hx of chronic uti per pt   UTI (urinary tract infection)    recurrent   Past Surgical History:   Procedure Laterality Date   arm surgery Bilateral Skin removal   DILATATION & CURETTAGE/HYSTEROSCOPY WITH MYOSURE N/A 06/11/2018   Procedure: DILATATION & CURETTAGE/HYSTEROSCOPY WITH MYOSURE RESECTION OF ENDOMETRIAL THICKENING;  Surgeon: Richardean Chimera, MD;  Location: [facility];  Service: Gynecology;  Laterality: N/A;  BMI 56   DILATION AND EVACUATION     ESOPHAGOGASTRODUODENOSCOPY (EGD) WITH PROPOFOL N/A 07/15/2018   Procedure: ESOPHAGOGASTRODUODENOSCOPY (EGD) WITH PROPOFOL;  Surgeon: Willis Modena, MD;  Location: [facility];  Service: Endoscopy;  Laterality: N/A;   ESOPHAGOGASTRODUODENOSCOPY (EGD) WITH PROPOFOL N/A 04/27/2019   Procedure: ESOPHAGOGASTRODUODENOSCOPY (EGD) WITH PROPOFOL;  Surgeon: Willis Modena, MD;  Location: [facility];  Service: Endoscopy;  Laterality: N/A;   ESOPHAGOGASTRODUODENOSCOPY (EGD) WITH PROPOFOL Bilateral 04/02/2022   Procedure: ESOPHAGOGASTRODUODENOSCOPY (EGD) WITH PROPOFOL;  Surgeon: Willis Modena, MD;  Location: [facility];  Service: Gastroenterology;  Laterality: Bilateral;   IR ABLATE LIVER CRYOABLATION  10/28/2019   IR ABLATE LIVER CRYOABLATION  05/07/2020   IR RADIOLOGIST EVAL & MGMT  10/14/2019   IR RADIOLOGIST EVAL & MGMT  04/03/2020   KIDNEY STONE SURGERY     KNEE SURGERY Right    LUMBAR LAMINECTOMY/DECOMPRESSION MICRODISCECTOMY N/A 11/08/2020   Procedure: LUMBAR FIVE AND SACRAL ONE LAMINECTOMY/DECOMPRESSION MICRODISCECTOMY 1 LEVEL;  Surgeon: Venita Lick, MD;  Location: [facility];  Service: Orthopedics;  Laterality: N/A;   TONSILLECTOMY     TOTAL KNEE ARTHROPLASTY Right 11/05/2021   Procedure: RIGHT TOTAL KNEE ARTHROPLASTY;  Surgeon: Marcene Corning, MD;  Location: [facility];  Service: Orthopedics;  Laterality: Right;   Patient Active Problem List   Diagnosis Date Noted   Restless leg syndrome 08/11/2022   Chronic kidney disease (CKD) 08/11/2022   Cirrhosis of liver ([name]) 08/11/2022   Primary osteoarthritis of right knee 11/05/2021   Primary localized  osteoarthritis of right knee 11/05/2021   Preop pulmonary/respiratory exam 09/12/2021   Recurrent pulmonary emboli ([name]) 02/18/2021   Hypoxemia 02/18/2021   Lumbar disc herniation 11/08/2020   Radiculopathy 11/05/2020   COVID-19 virus detected 06/07/2019   MDD (major depressive disorder) 04/18/2018   Severe recurrent major depression without psychotic features ([name]) 02/17/2018   Obesity, Class III, BMI 40-49.9 (morbid obesity) ([name]) 09/16/2017   Acute superficial venous thrombosis of left lower extremity    Thoracic aortic aneurysm without rupture ([name])    Pulmonary emboli ([name]) 09/15/2017   PTSD (post-traumatic stress disorder) 08/18/2011   DM (diabetes mellitus) ([name]) 08/18/2011   Sleep apnea 08/18/2011   Mood disorder ([name]) 08/18/2011    PCP: Dois Davenport, MD   REFERRING PROVIDER:   Terance Hart, MD    REFERRING DIAG: RIGHT FOOT STRESS INJURY, LEFT FOOT OA   THERAPY DIAG:  Pain in left foot  Pain in right foot  Difficulty in walking, not elsewhere classified  Rationale for Evaluation and Treatment: Rehabilitation  ONSET DATE: 6 weeks  SUBJECTIVE:   SUBJECTIVE STATEMENT: "Fett have been ok.  Went to [employer] gardens and walked for 11 hour.  Was real sore after that. Tape worked really well on my left foot from last session".   Right foot stress fx x 6 weeks ago no longer in boot.  Left foot OA. Wearing shoe insert on left and just had steroid shot top of left foot.  No R foot pain. Left foot does hurt. R TKR x 1 year. Good recovery knee is working well.  PERTINENT HISTORY: ER 08/11/22 Erika Woods is a 57 y.o. female with recurrent PEs on xarelto, h/o thoracic aortic aneurysm, obesity, CKD, RLA, MDD, cirrhosis, who presents with chest pain.  PAIN:  Are you having pain? Yes: NPRS scale: Current 6/10 Pain location: Lt dorsal surface (2nd metatarsal); Rt lateral foot  Pain description: throb dull Aggravating factors: weight bearing; walking x 30  mins Relieving factors: sitting/lying  PRECAUTIONS: Fall  WEIGHT BEARING RESTRICTIONS: No  FALLS:  Has patient fallen in last 6 months? Yes. Number of falls tripped over small child  LIVING ENVIRONMENT: Lives with: lives with their family Lives in: House/apartment Stairs: Yes: External: 4 steps; can reach both Has following equipment at home: None  OCCUPATION: Pre school teacher  PLOF: Independent  PATIENT GOALS: decrease pain  NEXT MD VISIT: as needed  OBJECTIVE:   DIAGNOSTIC FINDINGS: none in chart  PATIENT SURVEYS:  FOTO Primary measure 52% with goal of 66%  COGNITION: Overall cognitive status: Within functional limits for tasks assessed     SENSATION: WFL  EDEMA:  None  POSTURE: weight shift right  PALPATION: TTP right foot 2nd metatarsal head and cuneiform  LOWER EXTREMITY ROM:  Active ROM Right eval Left eval   Ankle dorsiflexion full full  Ankle plantarflexion    Ankle inversion full full  Ankle eversion full ff     (Blank rows = not tested)  LOWER EXTREMITY MMT:  MMT Right  eval Left eval Right  09/24/22  Hip flexion     Hip extension     Hip abduction     Hip adduction     Hip internal rotation     Hip external rotation     Knee flexion     Knee extension     Ankle dorsiflexion 4 sl P! 5 5-P!  Ankle plantarflexion 5 5   Ankle inversion     Ankle eversion      (Blank rows = not tested)                      2-4 digit resisted extension: 3+ limited by pain  FUNCTIONAL TESTS:  5 times sit to stand: 10.83 Timed up and go (TUG): 9.54  GAIT: Distance walked: 400 Assistive device utilized: None Level of assistance: Complete Independence Comments: Antalgic limp offloading left foot   TODAY'S TREATMENT:                                                                                                                              Pt seen for aquatic therapy today.  Treatment took place in water 3.5-4.75 ft in depth at the [facility] pool. Temp of water was 91.  Pt entered/exited the pool via steps using step to pattern with bilat hand rail.  *walking forward, backward and side stepping without support, cues to roll thru the feet *seated R/L ankle windshield wipers, circles CW/CCW, foot alphabet * marching forward / backward  * heel toe raises with bilat feet inv/ eversion  * heel walking / toe walking - alternating across width of pool with yellow noodle under arms  *Standing on solid noodle: rolling forward and back for gastroc and ant tib stretch then for balance ue support wall  - standing on noodle directly for balance challenge *tandem on solid noodle; ankle inversion and pronation then eversion and supination. Cues for control for strengthening added balance component. UE support on barbell * Soleus and gastroc stretch on bottom step  *Cycling with ankle df/PF   Pt requires the buoyancy and hydrostatic pressure of water for support, and to offload joints by unweighting joint load by at least 50 % in navel deep water and by at least 75-80% in chest to neck deep water.  Viscosity of the water is needed for resistance of strengthening. Water current perturbations provides challenge to standing balance requiring increased core activation.     PATIENT EDUCATION:  Education details:aquatic therapy progressions and modifications  Person educated: Patient Education method: Explanation Education comprehension: verbalized understanding  HOME EXERCISE PROGRAM: TBA  ASSESSMENT:  CLINICAL IMPRESSION: Pt continues with intermittent pain in feet mostly due to her activity level (working/field trips/vacation). She reports good response to rock tape applied last session.  She is no longer needing her boot or any ankle/foot brace. Her gait pattern is just slightly antalgic today although not consistent dependent on activity during day.  Right ankle strength improved. 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 Initially pt scheduled for short term PT intervention but I do believe she will benefit from continued services to add land based intervention for HEP and instruct on indep taping technique. She tolerates progressed session today with increased ankle strengthening and mobility.  Goals ongoing       Initial impression: Patient is a 57 y.o. f who was seen today for physical therapy evaluation and treatment for RIGHT FOOT STRESS INJURY, LEFT FOOT OA .  She reports recovery from stress injury wearing a boot as instructed x 6 weeks with out further discomfort at this time. Left foot with moderate pain increases with increased weight bearing through day. 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  She reports being able  to push through pain most days with pain at end of day varying.  Her strength and ROM is WNL.  She does have pain at OA site with palpation and with 2-3rd digit resisted extension.  Her gait is slightly antalgic offloading towards right.  She will benefit from a brief series of aquatic therapy intervention to introduce and instruct on exercising and stretching using water properties to improve strength and ROM of bilat feet/ankle, relieve external forces to tolerate walking/gait train and other movements and improve general health.    OBJECTIVE IMPAIRMENTS: decreased strength, obesity, and pain.   ACTIVITY LIMITATIONS: standing, stairs, and locomotion level  PARTICIPATION LIMITATIONS: community activity and occupation  PERSONAL FACTORS: 1-2 comorbidities: morbid obesity, PE  are also affecting patient's functional outcome.   REHAB POTENTIAL: Good  CLINICAL DECISION MAKING: Evolving/moderate complexity  EVALUATION COMPLEXITY: Moderate   GOALS: Goals reviewed with patient? Yes  SHORT TERM GOALS: Target date: 5/28 Pt will tolerate full aquatic sessions consistently without increase in  pain and with improving function to demonstrate good toleration and effectiveness of intervention.   Baseline: Goal status: Met 09/24/22  2.  Pt will report no increase in left foot pain after work day/end of day Baseline: 6/10 Goal status: INITIAL  3.  Pt to meet stated Foto Goal of 66% Baseline: 52% Goal status: INITIAL  4.  Antalgic gait pattern to resolve to normal pattern. Baseline: offloading right/antalgic Goal status: INITIAL  5.  Pt will be indep with final aquatic HEP for continued management of condition . Baseline: none Goal status: INITIAL    LONG TERM GOALS: Target date: 10/24/22       1. Pt will be indep with land based HEP for continued management of condition when no access to pool.    Baseline: none    Goal status: New  PLAN:  PT FREQUENCY: 1-2x/week  PT DURATION: 4 weeks  PLANNED INTERVENTIONS: Therapeutic exercises, Therapeutic activity, Neuromuscular re-education, Balance training, Gait training, Patient/Family education, Self Care, Joint mobilization, Stair training, DME instructions, Aquatic Therapy, Dry Needling, Electrical stimulation, Moist heat, Splintting, Taping, Ultrasound, Ionotophoresis 4mg /ml Dexamethasone, Manual therapy, and Re-evaluation  PLAN FOR NEXT SESSION: Aquatic therapy for strengthening and ROM bilat ankle/feet, knees. Gait training. Land based HEP for ankle/knee strength and stretching  Rushie Chestnut) Jaycob Mcclenton MPT 09/24/22 6:03 PM [facility] [address] [place], [place], [address] Phone: [phone]   Fax:  [phone]

## 2022-09-25 ENCOUNTER — Ambulatory Visit (HOSPITAL_BASED_OUTPATIENT_CLINIC_OR_DEPARTMENT_OTHER): Payer: Commercial Managed Care - PPO | Admitting: Physical Therapy

## 2022-09-30 ENCOUNTER — Ambulatory Visit (HOSPITAL_BASED_OUTPATIENT_CLINIC_OR_DEPARTMENT_OTHER): Payer: Commercial Managed Care - PPO | Admitting: Physical Therapy

## 2022-09-30 ENCOUNTER — Encounter (HOSPITAL_BASED_OUTPATIENT_CLINIC_OR_DEPARTMENT_OTHER): Payer: Self-pay | Admitting: Physical Therapy

## 2022-09-30 ENCOUNTER — Other Ambulatory Visit: Payer: Commercial Managed Care - PPO

## 2022-09-30 DIAGNOSIS — M79671 Pain in right foot: Secondary | ICD-10-CM

## 2022-09-30 DIAGNOSIS — M79672 Pain in left foot: Secondary | ICD-10-CM

## 2022-09-30 DIAGNOSIS — R262 Difficulty in walking, not elsewhere classified: Secondary | ICD-10-CM

## 2022-09-30 NOTE — Therapy (Signed)
OUTPATIENT PHYSICAL THERAPY LOWER EXTREMITY TREATMENT  Patient Name: Erika Woods MRN: [id number] DOB:[birth year], 57 y.o., female Today's Date: 09/30/2022  END OF SESSION:  PT End of Session - 09/30/22 1448     Visit Number 5    Number of Visits 8    Date for PT Re-Evaluation 10/24/22    Authorization Type uhc    PT Start Time 1443    PT Stop Time 1531    PT Time Calculation (min) 48 min    Activity Tolerance Patient tolerated treatment well    Behavior During Therapy WFL for tasks assessed/performed             Past Medical History:  Diagnosis Date   Acute pulmonary embolism ([name]) 11/11/2018   Acute pulmonary embolism with acute cor pulmonale ([name]) 09/16/2017   Acute superficial venous thrombosis of left lower extremity    Aortic insufficiency    Echocardiogram 08/2019: prob bicuspid AoV, mild to mod AI, mild AS (mean 12 mmHg), EF 55-60, no RWMA, Gr 1 DD, GLS -22.2%, normal RVSF, Ao Root 39 mm   Coronary CTA    Coronary CTA 08/2019: Calcium score 0, no evidence of CAD, ascending aorta 4 cm   Diabetes mellitus without complication ([name])    DM (diabetes mellitus) ([name]) 08/18/2011   GERD (gastroesophageal reflux disease)    Heart murmur    History of kidney stones    Impaired fasting glucose    Mood disorder ([name]) 08/18/2011   Obesity, Class III, BMI 40-49.9 (morbid obesity) ([name]) 09/16/2017   Obstructive sleep apnea on CPAP    Osteoarthritis of knee    Personal history of pulmonary embolism    PTSD (post-traumatic stress disorder) 08/18/2011   Pulmonary emboli ([name]) 09/15/2017   Renal disorder    kideny stones   Restless leg syndrome    Severe recurrent major depression without psychotic features ([name]) 02/17/2018   Sleep apnea 08/18/2011   Thoracic aortic aneurysm ([name])    Thoracic aortic aneurysm without rupture ([name])    Urinary tract infection    hx of chronic uti per pt   UTI (urinary tract infection)    recurrent   Past Surgical History:  Procedure  Laterality Date   arm surgery Bilateral Skin removal   DILATATION & CURETTAGE/HYSTEROSCOPY WITH MYOSURE N/A 06/11/2018   Procedure: DILATATION & CURETTAGE/HYSTEROSCOPY WITH MYOSURE RESECTION OF ENDOMETRIAL THICKENING;  Surgeon: Richardean Chimera, MD;  Location: [facility];  Service: Gynecology;  Laterality: N/A;  BMI 56   DILATION AND EVACUATION     ESOPHAGOGASTRODUODENOSCOPY (EGD) WITH PROPOFOL N/A 07/15/2018   Procedure: ESOPHAGOGASTRODUODENOSCOPY (EGD) WITH PROPOFOL;  Surgeon: Willis Modena, MD;  Location: [facility];  Service: Endoscopy;  Laterality: N/A;   ESOPHAGOGASTRODUODENOSCOPY (EGD) WITH PROPOFOL N/A 04/27/2019   Procedure: ESOPHAGOGASTRODUODENOSCOPY (EGD) WITH PROPOFOL;  Surgeon: Willis Modena, MD;  Location: [facility];  Service: Endoscopy;  Laterality: N/A;   ESOPHAGOGASTRODUODENOSCOPY (EGD) WITH PROPOFOL Bilateral 04/02/2022   Procedure: ESOPHAGOGASTRODUODENOSCOPY (EGD) WITH PROPOFOL;  Surgeon: Willis Modena, MD;  Location: [facility];  Service: Gastroenterology;  Laterality: Bilateral;   IR ABLATE LIVER CRYOABLATION  10/28/2019   IR ABLATE LIVER CRYOABLATION  05/07/2020   IR RADIOLOGIST EVAL & MGMT  10/14/2019   IR RADIOLOGIST EVAL & MGMT  04/03/2020   KIDNEY STONE SURGERY     KNEE SURGERY Right    LUMBAR LAMINECTOMY/DECOMPRESSION MICRODISCECTOMY N/A 11/08/2020   Procedure: LUMBAR FIVE AND SACRAL ONE LAMINECTOMY/DECOMPRESSION MICRODISCECTOMY 1 LEVEL;  Surgeon: Venita Lick, MD;  Location: [facility];  Service:  Orthopedics;  Laterality: N/A;   TONSILLECTOMY     TOTAL KNEE ARTHROPLASTY Right 11/05/2021   Procedure: RIGHT TOTAL KNEE ARTHROPLASTY;  Surgeon: Marcene Corning, MD;  Location: [facility];  Service: Orthopedics;  Laterality: Right;   Patient Active Problem List   Diagnosis Date Noted   Restless leg syndrome 08/11/2022   Chronic kidney disease (CKD) 08/11/2022   Cirrhosis of liver ([name]) 08/11/2022   Primary osteoarthritis of right knee 11/05/2021   Primary localized  osteoarthritis of right knee 11/05/2021   Preop pulmonary/respiratory exam 09/12/2021   Recurrent pulmonary emboli ([name]) 02/18/2021   Hypoxemia 02/18/2021   Lumbar disc herniation 11/08/2020   Radiculopathy 11/05/2020   COVID-19 virus detected 06/07/2019   MDD (major depressive disorder) 04/18/2018   Severe recurrent major depression without psychotic features ([name]) 02/17/2018   Obesity, Class III, BMI 40-49.9 (morbid obesity) ([name]) 09/16/2017   Acute superficial venous thrombosis of left lower extremity    Thoracic aortic aneurysm without rupture ([name])    Pulmonary emboli ([name]) 09/15/2017   PTSD (post-traumatic stress disorder) 08/18/2011   DM (diabetes mellitus) ([name]) 08/18/2011   Sleep apnea 08/18/2011   Mood disorder ([name]) 08/18/2011    PCP: Dois Davenport, MD   REFERRING PROVIDER:   Terance Hart, MD    REFERRING DIAG: RIGHT FOOT STRESS INJURY, LEFT FOOT OA   THERAPY DIAG:  Pain in left foot  Pain in right foot  Difficulty in walking, not elsewhere classified  Rationale for Evaluation and Treatment: Rehabilitation  ONSET DATE: 6 weeks  SUBJECTIVE:   SUBJECTIVE STATEMENT: Pt reports her feet are sore from outside "water day" with flip flops for work.  She'd like to learn how to tape her foot/ankle and get an HEP.   PERTINENT HISTORY: ER 08/11/22 PETER LIVERMAN is a 57 y.o. female with recurrent PEs on xarelto, h/o thoracic aortic aneurysm, obesity, CKD, RLA, MDD, cirrhosis, who presents with chest pain.  PAIN:  Are you having pain? Yes: NPRS scale: Current 5-6/10 Pain location: bilat feet Pain description: throb dull Aggravating factors: weight bearing; walking x 30 mins Relieving factors: sitting/lying  PRECAUTIONS: Fall  WEIGHT BEARING RESTRICTIONS: No  FALLS:  Has patient fallen in last 6 months? Yes. Number of falls tripped over small child  LIVING ENVIRONMENT: Lives with: lives with their family Lives in: House/apartment Stairs: Yes:  External: 4 steps; can reach both Has following equipment at home: None  OCCUPATION: Pre school teacher  PLOF: Independent  PATIENT GOALS: decrease pain  NEXT MD VISIT: as needed  OBJECTIVE:   DIAGNOSTIC FINDINGS: none in chart  PATIENT SURVEYS:  FOTO Primary measure 52% with goal of 66%  COGNITION: Overall cognitive status: Within functional limits for tasks assessed     SENSATION: WFL  EDEMA:  None  POSTURE: weight shift right  PALPATION: TTP right foot 2nd metatarsal head and cuneiform  LOWER EXTREMITY ROM:  Active ROM Right eval Left eval   Ankle dorsiflexion full full  Ankle plantarflexion    Ankle inversion full full  Ankle eversion full ff     (Blank rows = not tested)  LOWER EXTREMITY MMT:  MMT Right eval Left eval Right  09/24/22  Hip flexion     Hip extension     Hip abduction     Hip adduction     Hip internal rotation     Hip external rotation     Knee flexion     Knee extension     Ankle dorsiflexion 4 sl  P! 5 5-P!  Ankle plantarflexion 5 5   Ankle inversion     Ankle eversion      (Blank rows = not tested)                      2-4 digit resisted extension: 3+ limited by pain  FUNCTIONAL TESTS:  5 times sit to stand: 10.83 Timed up and go (TUG): 9.54  GAIT: Distance walked: 400 Assistive device utilized: None Level of assistance: Complete Independence Comments: Antalgic limp offloading left foot   TODAY'S TREATMENT:                                                                                                                              Pt seen for aquatic therapy today.  Treatment took place in water 3.5-4.75 ft in depth at the [facility] pool. Temp of water was 91.  Pt entered/exited the pool via steps using step to pattern with bilat hand rail.  *walking forward, backward * Holding wall:  heel/toe raises x 20 * side stepping without support, cues to roll thru the feet *seated R/L ankle windshield wipers,  circles CW/CCW, foot alphabet * marching forward / backward  * twist feet, moving R/L, lifting bilat heels then toes * braiding L/R  * tandem gait forward / backward  * straddling noodle and cycling * Holding noodle and 3 way LE swings x 10 each LE  * standing on yellow noodle and balancing, with single arm raise * after dried off, pt instructed in application of reg Rock tape to L/ R ankle. Lt ankle in same pattern as on 4/25 and Rt dorsum of foot over 4th metatarsal.  Pt requires the buoyancy and hydrostatic pressure of water for support, and to offload joints by unweighting joint load by at least 50 % in navel deep water and by at least 75-80% in chest to neck deep water.  Viscosity of the water is needed for resistance of strengthening. Water current perturbations provides challenge to standing balance requiring increased core activation.     PATIENT EDUCATION:  Education details:aquatic therapy progressions and modifications  Person educated: Patient Education method: Explanation Education comprehension: verbalized understanding  HOME EXERCISE PROGRAM: AQUATIC Access Code: [email or web address] URL: [email or web address] Date: 09/30/2022 Prepared by: [facility] [address] * not issued yet   ASSESSMENT:  CLINICAL IMPRESSION: Pt continues with intermittent pain in feet mostly due to her activity level (working/field trips/vacation). She tolerated exercises in water well, with overall reduction in pain in both feet.  Pt educated on self application of Rock tape to bilat feet; pt verbalized understanding.  Therapist to instruct her on aquatic HEP next visit  (created today- will issue next visit).  Pt is making gradual progress towards goals.     OBJECTIVE IMPAIRMENTS: decreased strength, obesity, and pain.   ACTIVITY LIMITATIONS: standing, stairs, and locomotion level  PARTICIPATION LIMITATIONS: community activity and occupation  PERSONAL  FACTORS: 1-2  comorbidities: morbid obesity, PE  are also affecting patient's functional outcome.   REHAB POTENTIAL: Good  CLINICAL DECISION MAKING: Evolving/moderate complexity  EVALUATION COMPLEXITY: Moderate   GOALS: Goals reviewed with patient? Yes  SHORT TERM GOALS: Target date: 5/28 Pt will tolerate full aquatic sessions consistently without increase in pain and with improving function to demonstrate good toleration and effectiveness of intervention.   Baseline: Goal status: Met 09/24/22  2.  Pt will report no increase in left foot pain after work day/end of day Baseline: 4/10 on average Goal status: Ongoing 09/30/22  3.  Pt to meet stated Foto Goal of 66% Baseline: 52% Goal status: Ongoing 09/30/22  4.  Antalgic gait pattern to resolve to normal pattern. Baseline: offloading right/antalgic Goal status: INITIAL  5.  Pt will be indep with final aquatic HEP for continued management of condition . Baseline: none Goal status: INITIAL    LONG TERM GOALS: Target date: 10/24/22       1. Pt will be indep with land based HEP for continued management of condition when no access to pool.    Baseline: none    Goal status: New  PLAN:  PT FREQUENCY: 1-2x/week  PT DURATION: 4 weeks  PLANNED INTERVENTIONS: Therapeutic exercises, Therapeutic activity, Neuromuscular re-education, Balance training, Gait training, Patient/Family education, Self Care, Joint mobilization, Stair training, DME instructions, Aquatic Therapy, Dry Needling, Electrical stimulation, Moist heat, Splintting, Taping, Ultrasound, Ionotophoresis 4mg /ml Dexamethasone, Manual therapy, and Re-evaluation  PLAN FOR NEXT SESSION: Aquatic therapy for strengthening and ROM bilat ankle/feet, knees. Gait training. Land based HEP for ankle/knee strength and stretching  Mayer Camel, PTA 09/30/22 6:10 PM [facility] [address] [place], [place], [address] Phone:  [phone]   Fax:  [phone]

## 2022-10-01 ENCOUNTER — Ambulatory Visit (HOSPITAL_BASED_OUTPATIENT_CLINIC_OR_DEPARTMENT_OTHER): Payer: Commercial Managed Care - PPO | Admitting: Physical Therapy

## 2022-10-01 ENCOUNTER — Encounter (HOSPITAL_BASED_OUTPATIENT_CLINIC_OR_DEPARTMENT_OTHER): Payer: Self-pay | Admitting: Physical Therapy

## 2022-10-01 DIAGNOSIS — R262 Difficulty in walking, not elsewhere classified: Secondary | ICD-10-CM

## 2022-10-01 DIAGNOSIS — M79671 Pain in right foot: Secondary | ICD-10-CM

## 2022-10-01 DIAGNOSIS — M79672 Pain in left foot: Secondary | ICD-10-CM

## 2022-10-01 NOTE — Therapy (Signed)
OUTPATIENT PHYSICAL THERAPY LOWER EXTREMITY TREATMENT  Patient Name: Erika Woods MRN: [id number] DOB:09-09-1965, 57 y.o., female Today's Date: 10/01/2022  END OF SESSION:  PT End of Session - 10/01/22 1638     Visit Number 6    Number of Visits 8    Date for PT Re-Evaluation 10/24/22    Authorization Type uhc    PT Start Time 1546    PT Stop Time 1630    PT Time Calculation (min) 44 min    Activity Tolerance Patient tolerated treatment well    Behavior During Therapy WFL for tasks assessed/performed             Past Medical History:  Diagnosis Date   Acute pulmonary embolism ([name]) 11/11/2018   Acute pulmonary embolism with acute cor pulmonale ([name]) 09/16/2017   Acute superficial venous thrombosis of left lower extremity    Aortic insufficiency    Echocardiogram 08/2019: prob bicuspid AoV, mild to mod AI, mild AS (mean 12 mmHg), EF 55-60, no RWMA, Gr 1 DD, GLS -22.2%, normal RVSF, Ao Root 39 mm   Coronary CTA    Coronary CTA 08/2019: Calcium score 0, no evidence of CAD, ascending aorta 4 cm   Diabetes mellitus without complication ([name])    DM (diabetes mellitus) ([name]) 08/18/2011   GERD (gastroesophageal reflux disease)    Heart murmur    History of kidney stones    Impaired fasting glucose    Mood disorder ([name]) 08/18/2011   Obesity, Class III, BMI 40-49.9 (morbid obesity) ([name]) 09/16/2017   Obstructive sleep apnea on CPAP    Osteoarthritis of knee    Personal history of pulmonary embolism    PTSD (post-traumatic stress disorder) 08/18/2011   Pulmonary emboli ([name]) 09/15/2017   Renal disorder    kideny stones   Restless leg syndrome    Severe recurrent major depression without psychotic features ([name]) 02/17/2018   Sleep apnea 08/18/2011   Thoracic aortic aneurysm ([name])    Thoracic aortic aneurysm without rupture ([name])    Urinary tract infection    hx of chronic uti per pt   UTI (urinary tract infection)    recurrent   Past Surgical History:  Procedure  Laterality Date   arm surgery Bilateral Skin removal   DILATATION & CURETTAGE/HYSTEROSCOPY WITH MYOSURE N/A 06/11/2018   Procedure: DILATATION & CURETTAGE/HYSTEROSCOPY WITH MYOSURE RESECTION OF ENDOMETRIAL THICKENING;  Surgeon: Richardean Chimera, MD;  Location: [facility];  Service: Gynecology;  Laterality: N/A;  BMI 56   DILATION AND EVACUATION     ESOPHAGOGASTRODUODENOSCOPY (EGD) WITH PROPOFOL N/A 07/15/2018   Procedure: ESOPHAGOGASTRODUODENOSCOPY (EGD) WITH PROPOFOL;  Surgeon: Willis Modena, MD;  Location: [facility];  Service: Endoscopy;  Laterality: N/A;   ESOPHAGOGASTRODUODENOSCOPY (EGD) WITH PROPOFOL N/A 04/27/2019   Procedure: ESOPHAGOGASTRODUODENOSCOPY (EGD) WITH PROPOFOL;  Surgeon: Willis Modena, MD;  Location: [facility];  Service: Endoscopy;  Laterality: N/A;   ESOPHAGOGASTRODUODENOSCOPY (EGD) WITH PROPOFOL Bilateral 04/02/2022   Procedure: ESOPHAGOGASTRODUODENOSCOPY (EGD) WITH PROPOFOL;  Surgeon: Willis Modena, MD;  Location: [facility];  Service: Gastroenterology;  Laterality: Bilateral;   IR ABLATE LIVER CRYOABLATION  10/28/2019   IR ABLATE LIVER CRYOABLATION  05/07/2020   IR RADIOLOGIST EVAL & MGMT  10/14/2019   IR RADIOLOGIST EVAL & MGMT  04/03/2020   KIDNEY STONE SURGERY     KNEE SURGERY Right    LUMBAR LAMINECTOMY/DECOMPRESSION MICRODISCECTOMY N/A 11/08/2020   Procedure: LUMBAR FIVE AND SACRAL ONE LAMINECTOMY/DECOMPRESSION MICRODISCECTOMY 1 LEVEL;  Surgeon: Venita Lick, MD;  Location: [facility];  Service:  Orthopedics;  Laterality: N/A;   TONSILLECTOMY     TOTAL KNEE ARTHROPLASTY Right 11/05/2021   Procedure: RIGHT TOTAL KNEE ARTHROPLASTY;  Surgeon: Marcene Corning, MD;  Location: [facility];  Service: Orthopedics;  Laterality: Right;   Patient Active Problem List   Diagnosis Date Noted   Restless leg syndrome 08/11/2022   Chronic kidney disease (CKD) 08/11/2022   Cirrhosis of liver ([name]) 08/11/2022   Primary osteoarthritis of right knee 11/05/2021   Primary localized  osteoarthritis of right knee 11/05/2021   Preop pulmonary/respiratory exam 09/12/2021   Recurrent pulmonary emboli ([name]) 02/18/2021   Hypoxemia 02/18/2021   Lumbar disc herniation 11/08/2020   Radiculopathy 11/05/2020   COVID-19 virus detected 06/07/2019   MDD (major depressive disorder) 04/18/2018   Severe recurrent major depression without psychotic features ([name]) 02/17/2018   Obesity, Class III, BMI 40-49.9 (morbid obesity) ([name]) 09/16/2017   Acute superficial venous thrombosis of left lower extremity    Thoracic aortic aneurysm without rupture ([name])    Pulmonary emboli ([name]) 09/15/2017   PTSD (post-traumatic stress disorder) 08/18/2011   DM (diabetes mellitus) ([name]) 08/18/2011   Sleep apnea 08/18/2011   Mood disorder ([name]) 08/18/2011    PCP: Dois Davenport, MD   REFERRING PROVIDER:   Terance Hart, MD    REFERRING DIAG: RIGHT FOOT STRESS INJURY, LEFT FOOT OA   THERAPY DIAG:  Pain in left foot  Pain in right foot  Difficulty in walking, not elsewhere classified  Rationale for Evaluation and Treatment: Rehabilitation  ONSET DATE: 6 weeks  SUBJECTIVE:   SUBJECTIVE STATEMENT: Pt reports indep with taping feet.  She has had good pain relief with use of tape.  No complaints of pain today  PERTINENT HISTORY: ER 08/11/22 Erika Woods is a 57 y.o. female with recurrent PEs on xarelto, h/o thoracic aortic aneurysm, obesity, CKD, RLA, MDD, cirrhosis, who presents with chest pain.  PAIN:  Are you having pain? Yes: NPRS scale: Current 0/10 Pain location: bilat feet Pain description: throb dull Aggravating factors: weight bearing; walking x 30 mins Relieving factors: sitting/lying  PRECAUTIONS: Fall  WEIGHT BEARING RESTRICTIONS: No  FALLS:  Has patient fallen in last 6 months? Yes. Number of falls tripped over small child  LIVING ENVIRONMENT: Lives with: lives with their family Lives in: House/apartment Stairs: Yes: External: 4 steps; can reach both Has  following equipment at home: None  OCCUPATION: Pre school teacher  PLOF: Independent  PATIENT GOALS: decrease pain  NEXT MD VISIT: as needed  OBJECTIVE:   DIAGNOSTIC FINDINGS: none in chart  PATIENT SURVEYS:  FOTO Primary measure 52% with goal of 66%  COGNITION: Overall cognitive status: Within functional limits for tasks assessed     SENSATION: WFL  EDEMA:  None  POSTURE: weight shift right  PALPATION: TTP right foot 2nd metatarsal head and cuneiform  LOWER EXTREMITY ROM:  Active ROM Right eval Left eval   Ankle dorsiflexion full full  Ankle plantarflexion    Ankle inversion full full  Ankle eversion full ff     (Blank rows = not tested)  LOWER EXTREMITY MMT:  MMT Right eval Left eval Right  09/24/22  Hip flexion     Hip extension     Hip abduction     Hip adduction     Hip internal rotation     Hip external rotation     Knee flexion     Knee extension     Ankle dorsiflexion 4 sl P! 5 5-P!  Ankle plantarflexion 5  5   Ankle inversion     Ankle eversion      (Blank rows = not tested)                      2-4 digit resisted extension: 3+ limited by pain  FUNCTIONAL TESTS:  5 times sit to stand: 10.83 Timed up and go (TUG): 9.54  GAIT: Distance walked: 400 Assistive device utilized: None Level of assistance: Complete Independence Comments: Antalgic limp offloading left foot   TODAY'S TREATMENT:                                                                                                                              Pt seen for aquatic therapy today.  Treatment took place in water 3.5-4.75 ft in depth at the [facility] pool. Temp of water was 91.  Pt entered/exited the pool via steps using step to pattern with bilat hand rail.  Access Code: [email or web address] URL: [email or web address] Date: 10/01/2022 Prepared by: Geni Bers  Exercises - Walking March    - Heel Toe Raises with Counter Support  - Side Stepping  with Hand Floats  - Walking Tandem Stance   -Walking Tandem Stance  - Single Leg Stance with 3-Way Kick  - Braided Sidestepping with Arms Out  -SLS then tandem stance with ue add/abd using yellow HB - Braided Sidestepping with Arms Out   - Seated Straddle on Noodle Breast Stroke Arms and Bicycle Legs Forward  - Wide Stance on Noodle with Arm Raise; squats -Tandem stance on noodle with arm raise; squats - Forward Step Down  - Gastroc Stretch on Step   - Standing Soleus Stretch   - Gastroc Stretch on Step    Pt requires the buoyancy and hydrostatic pressure of water for support, and to offload joints by unweighting joint load by at least 50 % in navel deep water and by at least 75-80% in chest to neck deep water.  Viscosity of the water is needed for resistance of strengthening. Water current perturbations provides challenge to standing balance requiring increased core activation.     PATIENT EDUCATION:  Education details:aquatic therapy progressions and modifications  Person educated: Patient Education method: Explanation Education comprehension: verbalized understanding  HOME EXERCISE PROGRAM: AQUATIC Access Code: [email or web address] URL: [email or web address] Date: 09/30/2022 Prepared by: [facility] [address]  Access Code: [email or web address] URL: [email or web address] Date: 10/01/2022 Prepared by: Geni Bers  This aquatic home exercise program from [employer] utilizes pictures from land based exercises, but has been adapted prior to lamination and issuance.   Exercises - Walking March  - 1 x daily - 7 x weekly - Heel Toe Raises with Counter Support  - 1 x daily - 7 x weekly - 1 sets - 10-20 reps - Side Stepping with Hand Floats  - 1 x daily - 7 x weekly - Walking Tandem Stance  - 1 x daily - 7 x weekly - Single  Leg Stance with 3-Way Kick  - 1 x daily - 7 x weekly - 1 sets - 10 reps - Braided Sidestepping with Arms Out  - 1 x daily - 7 x  weekly - Seated Straddle on Noodle Breast Stroke Arms and Bicycle Legs Forward  - 1 x daily - 7 x weekly - Wide Stance on Noodle with Arm Raise  - 1 x daily - 7 x weekly - 1 sets - 5-10 reps - Forward Step Down  - 1 x daily - 7 x weekly - 1-2 sets - 10 reps - Gastroc Stretch on Step  - 1 x daily - 7 x weekly - 1 sets - 2 reps - 15 seconds  hold - Standing Soleus Stretch  - 1 x daily - 7 x weekly - 1 sets - 2 reps - 15 seconds hold   ASSESSMENT:  CLINICAL IMPRESSION: No complaint of feet pain.  Has been off of them with school ending.  She is instructed through final aquatic HEP, It is laminated and issued to pt.  She requires minor cueing and clarifications on program completing without difficulty or pain. She demonstrates improved gait without antalgic limp or off loading.  Foto completed today (Pt not meeting goal). Most goals met.  She will be seen land based for a land based HEP focusing on knee/ankle/foot strength and ROM.  I anticipate 1 visit  for land based intervention then DC.  OBJECTIVE IMPAIRMENTS: decreased strength, obesity, and pain.   ACTIVITY LIMITATIONS: standing, stairs, and locomotion level  PARTICIPATION LIMITATIONS: community activity and occupation  PERSONAL FACTORS: 1-2 comorbidities: morbid obesity, PE  are also affecting patient's functional outcome.   REHAB POTENTIAL: Good  CLINICAL DECISION MAKING: Evolving/moderate complexity  EVALUATION COMPLEXITY: Moderate   GOALS: Goals reviewed with patient? Yes  SHORT TERM GOALS: Target date: 5/28 Pt will tolerate full aquatic sessions consistently without increase in pain and with improving function to demonstrate good toleration and effectiveness of intervention.   Baseline: Goal status: Met 09/24/22  2.  Pt will report no increase in left foot pain after work day/end of day Baseline: 4/10 on average Goal status: Ongoing 09/30/22  3.  Pt to meet stated Foto Goal of 66% Baseline: 52% Goal status: Ongoing  09/30/22:  Not met 62% 10/01/22  4.  Antalgic gait pattern to resolve to normal pattern. Baseline: offloading right/antalgic Goal status: Met 10/01/22  5.  Pt will be indep with final aquatic HEP for continued management of condition . Baseline: none Goal status: Met 10/01/22    LONG TERM GOALS: Target date: 10/24/22       1. Pt will be indep with land based HEP for continued management of condition when no access to pool.    Baseline: none    Goal status: New  PLAN:  PT FREQUENCY: 1-2x/week  PT DURATION: 4 weeks  PLANNED INTERVENTIONS: Therapeutic exercises, Therapeutic activity, Neuromuscular re-education, Balance training, Gait training, Patient/Family education, Self Care, Joint mobilization, Stair training, DME instructions, Aquatic Therapy, Dry Needling, Electrical stimulation, Moist heat, Splintting, Taping, Ultrasound, Ionotophoresis 4mg /ml Dexamethasone, Manual therapy, and Re-evaluation  PLAN FOR NEXT SESSION: Aquatic therapy for strengthening and ROM bilat ankle/feet, knees. Gait training. Land based HEP for ankle/knee strength and stretching  Erika Woods) Erika Woods MPT 10/01/22 4:53 PM [facility] [address] [place], [place], [address] Phone: [phone]   Fax:  [phone]

## 2022-10-02 ENCOUNTER — Ambulatory Visit (HOSPITAL_BASED_OUTPATIENT_CLINIC_OR_DEPARTMENT_OTHER): Payer: Commercial Managed Care - PPO | Admitting: Physical Therapy

## 2022-10-07 ENCOUNTER — Encounter (HOSPITAL_BASED_OUTPATIENT_CLINIC_OR_DEPARTMENT_OTHER): Payer: Self-pay | Admitting: Physical Therapy

## 2022-10-07 ENCOUNTER — Telehealth: Payer: Self-pay | Admitting: *Deleted

## 2022-10-07 ENCOUNTER — Ambulatory Visit (HOSPITAL_BASED_OUTPATIENT_CLINIC_OR_DEPARTMENT_OTHER): Payer: Commercial Managed Care - PPO | Admitting: Physical Therapy

## 2022-10-07 NOTE — Telephone Encounter (Signed)
Faxed completed form to [facility] with directions for lovenox bridge for endoscopy

## 2022-10-08 ENCOUNTER — Encounter (HOSPITAL_COMMUNITY): Payer: Self-pay | Admitting: Gastroenterology

## 2022-10-09 ENCOUNTER — Ambulatory Visit
Admission: RE | Admit: 2022-10-09 | Discharge: 2022-10-09 | Disposition: A | Discharge status: Home / Self Care | Payer: Commercial Managed Care - PPO | Source: Ambulatory Visit | Attending: Gastroenterology | Admitting: Gastroenterology

## 2022-10-09 ENCOUNTER — Ambulatory Visit (HOSPITAL_BASED_OUTPATIENT_CLINIC_OR_DEPARTMENT_OTHER): Payer: Commercial Managed Care - PPO

## 2022-10-09 ENCOUNTER — Encounter (HOSPITAL_BASED_OUTPATIENT_CLINIC_OR_DEPARTMENT_OTHER): Payer: Self-pay

## 2022-10-09 DIAGNOSIS — M79671 Pain in right foot: Secondary | ICD-10-CM

## 2022-10-09 DIAGNOSIS — M79672 Pain in left foot: Secondary | ICD-10-CM

## 2022-10-09 DIAGNOSIS — R262 Difficulty in walking, not elsewhere classified: Secondary | ICD-10-CM

## 2022-10-09 DIAGNOSIS — K7469 Other cirrhosis of liver: Secondary | ICD-10-CM

## 2022-10-09 NOTE — Therapy (Addendum)
OUTPATIENT PHYSICAL THERAPY LOWER EXTREMITY DISCHARGE Patient Name: Erika Woods MRN: [id number] DOB:1966-03-03, 57 y.o., female Today's Date: 10/10/2022 PHYSICAL THERAPY DISCHARGE SUMMARY  Visits from Start of Care: 7  Current functional level related to goals / functional outcomes: Indep   Remaining deficits: Minimal discomfort   Education / Equipment: Management of condition/ HEP   Patient agrees to discharge. Patient goals were met. Patient is being discharged due to meeting the stated rehab goals.  END OF SESSION:  PT End of Session - 10/09/22 1027     Visit Number 7    Number of Visits 8    Date for PT Re-Evaluation 10/24/22    Authorization Type uhc    PT Start Time 1018    PT Stop Time 1100    PT Time Calculation (min) 42 min    Activity Tolerance Patient tolerated treatment well    Behavior During Therapy WFL for tasks assessed/performed              Past Medical History:  Diagnosis Date   Acute pulmonary embolism ([name]) 11/11/2018   Acute pulmonary embolism with acute cor pulmonale ([name]) 09/16/2017   Acute superficial venous thrombosis of left lower extremity    Aortic insufficiency    Echocardiogram 08/2019: prob bicuspid AoV, mild to mod AI, mild AS (mean 12 mmHg), EF 55-60, no RWMA, Gr 1 DD, GLS -22.2%, normal RVSF, Ao Root 39 mm   Coronary CTA    Coronary CTA 08/2019: Calcium score 0, no evidence of CAD, ascending aorta 4 cm   Diabetes mellitus without complication ([name])    DM (diabetes mellitus) ([name]) 08/18/2011   GERD (gastroesophageal reflux disease)    Heart murmur    History of kidney stones    Impaired fasting glucose    Mood disorder ([name]) 08/18/2011   Obesity, Class III, BMI 40-49.9 (morbid obesity) ([name]) 09/16/2017   Obstructive sleep apnea on CPAP    Osteoarthritis of knee    Personal history of pulmonary embolism    PTSD (post-traumatic stress disorder) 08/18/2011   Pulmonary emboli ([name]) 09/15/2017   Renal disorder    kideny  stones   Restless leg syndrome    Severe recurrent major depression without psychotic features ([name]) 02/17/2018   Sleep apnea 08/18/2011   Thoracic aortic aneurysm ([name])    Thoracic aortic aneurysm without rupture ([name])    Urinary tract infection    hx of chronic uti per pt   UTI (urinary tract infection)    recurrent   Past Surgical History:  Procedure Laterality Date   arm surgery Bilateral Skin removal   DILATATION & CURETTAGE/HYSTEROSCOPY WITH MYOSURE N/A 06/11/2018   Procedure: DILATATION & CURETTAGE/HYSTEROSCOPY WITH MYOSURE RESECTION OF ENDOMETRIAL THICKENING;  Surgeon: Richardean Chimera, MD;  Location: [facility];  Service: Gynecology;  Laterality: N/A;  BMI 56   DILATION AND EVACUATION     ESOPHAGOGASTRODUODENOSCOPY (EGD) WITH PROPOFOL N/A 07/15/2018   Procedure: ESOPHAGOGASTRODUODENOSCOPY (EGD) WITH PROPOFOL;  Surgeon: Willis Modena, MD;  Location: [facility];  Service: Endoscopy;  Laterality: N/A;   ESOPHAGOGASTRODUODENOSCOPY (EGD) WITH PROPOFOL N/A 04/27/2019   Procedure: ESOPHAGOGASTRODUODENOSCOPY (EGD) WITH PROPOFOL;  Surgeon: Willis Modena, MD;  Location: [facility];  Service: Endoscopy;  Laterality: N/A;   ESOPHAGOGASTRODUODENOSCOPY (EGD) WITH PROPOFOL Bilateral 04/02/2022   Procedure: ESOPHAGOGASTRODUODENOSCOPY (EGD) WITH PROPOFOL;  Surgeon: Willis Modena, MD;  Location: [facility];  Service: Gastroenterology;  Laterality: Bilateral;   IR ABLATE LIVER CRYOABLATION  10/28/2019   IR ABLATE LIVER CRYOABLATION  05/07/2020   IR  RADIOLOGIST EVAL & MGMT  10/14/2019   IR RADIOLOGIST EVAL & MGMT  04/03/2020   KIDNEY STONE SURGERY     KNEE SURGERY Right    LUMBAR LAMINECTOMY/DECOMPRESSION MICRODISCECTOMY N/A 11/08/2020   Procedure: LUMBAR FIVE AND SACRAL ONE LAMINECTOMY/DECOMPRESSION MICRODISCECTOMY 1 LEVEL;  Surgeon: Venita Lick, MD;  Location: [facility];  Service: Orthopedics;  Laterality: N/A;   TONSILLECTOMY     TOTAL KNEE ARTHROPLASTY Right 11/05/2021   Procedure: RIGHT TOTAL  KNEE ARTHROPLASTY;  Surgeon: Marcene Corning, MD;  Location: [facility];  Service: Orthopedics;  Laterality: Right;   Patient Active Problem List   Diagnosis Date Noted   Restless leg syndrome 08/11/2022   Chronic kidney disease (CKD) 08/11/2022   Cirrhosis of liver ([name]) 08/11/2022   Primary osteoarthritis of right knee 11/05/2021   Primary localized osteoarthritis of right knee 11/05/2021   Preop pulmonary/respiratory exam 09/12/2021   Recurrent pulmonary emboli ([name]) 02/18/2021   Hypoxemia 02/18/2021   Lumbar disc herniation 11/08/2020   Radiculopathy 11/05/2020   COVID-19 virus detected 06/07/2019   MDD (major depressive disorder) 04/18/2018   Severe recurrent major depression without psychotic features ([name]) 02/17/2018   Obesity, Class III, BMI 40-49.9 (morbid obesity) ([name]) 09/16/2017   Acute superficial venous thrombosis of left lower extremity    Thoracic aortic aneurysm without rupture ([name])    Pulmonary emboli ([name]) 09/15/2017   PTSD (post-traumatic stress disorder) 08/18/2011   DM (diabetes mellitus) ([name]) 08/18/2011   Sleep apnea 08/18/2011   Mood disorder ([name]) 08/18/2011    PCP: Dois Davenport, MD   REFERRING PROVIDER:   Terance Hart, MD    REFERRING DIAG: RIGHT FOOT STRESS INJURY, LEFT FOOT OA   THERAPY DIAG:  Pain in left foot  Pain in right foot  Difficulty in walking, not elsewhere classified  Rationale for Evaluation and Treatment: Rehabilitation  ONSET DATE: 6 weeks  SUBJECTIVE:   SUBJECTIVE STATEMENT: Pt reports improvement with taping. Does have greater pain in L R foot compared to L foot with prolonged standing and walking. Pain is not present everyday. Feels benefit form K tape.  PERTINENT HISTORY: ER 08/11/22 Erika Woods is a 57 y.o. female with recurrent PEs on xarelto, h/o thoracic aortic aneurysm, obesity, CKD, RLA, MDD, cirrhosis, who presents with chest pain.  PAIN:  Are you having pain? Yes: NPRS scale: Current 0/10 Pain  location: bilat feet Pain description: throb dull Aggravating factors: weight bearing; walking x 30 mins Relieving factors: sitting/lying  PRECAUTIONS: Fall  WEIGHT BEARING RESTRICTIONS: No  FALLS:  Has patient fallen in last 6 months? Yes. Number of falls tripped over small child  LIVING ENVIRONMENT: Lives with: lives with their family Lives in: House/apartment Stairs: Yes: External: 4 steps; can reach both Has following equipment at home: None  OCCUPATION: Pre school teacher  PLOF: Independent  PATIENT GOALS: decrease pain  NEXT MD VISIT: as needed  OBJECTIVE:   DIAGNOSTIC FINDINGS: none in chart  PATIENT SURVEYS:  FOTO Primary measure 52% with goal of 66%  COGNITION: Overall cognitive status: Within functional limits for tasks assessed     SENSATION: WFL  EDEMA:  None  POSTURE: weight shift right  PALPATION: TTP right foot 2nd metatarsal head and cuneiform  LOWER EXTREMITY ROM:  Active ROM Right eval Left eval   Ankle dorsiflexion full full  Ankle plantarflexion    Ankle inversion full full  Ankle eversion full ff     (Blank rows = not tested)  LOWER EXTREMITY MMT:  MMT Right eval  Left eval Right  09/24/22  Hip flexion     Hip extension     Hip abduction     Hip adduction     Hip internal rotation     Hip external rotation     Knee flexion     Knee extension     Ankle dorsiflexion 4 sl P! 5 5-P!  Ankle plantarflexion 5 5   Ankle inversion     Ankle eversion      (Blank rows = not tested)                      2-4 digit resisted extension: 3+ limited by pain  FUNCTIONAL TESTS:  5 times sit to stand: 10.83 Timed up and go (TUG): 9.54  GAIT: Distance walked: 400 Assistive device utilized: None Level of assistance: Complete Independence Comments: Antalgic limp offloading left foot   TODAY'S TREATMENT:                                                                                                                               -Towel scrunches -Toe yoga x20ea bil -Seated HR/TR Standing HR/TR -Tandem balance  -SLS -HEP update   PATIENT EDUCATION:  Education details:aquatic therapy progressions and modifications  Person educated: Patient Education method: Explanation Education comprehension: verbalized understanding  HOME EXERCISE PROGRAM: LAND HEP: Access Code: [email or web address] URL: [email or web address] Date: 10/09/2022 Prepared by: Riki Altes  Exercises - Towel Scrunches  - 2 x daily - 7 x weekly - 3 sets - 10 reps - Seated Great Toe Extension  - 2 x daily - 7 x weekly - 3 sets - 10 reps - Seated Lesser Toes Extension  - 2 x daily - 7 x weekly - 3 sets - 10 reps - Seated Heel Toe Raises  - 2 x daily - 7 x weekly - 3 sets - 10 reps - Standing Tandem Balance with Counter Support  - 1 x daily - 7 x weekly - 3 sets - 30seconds hold - Standing Single Leg Stance with Counter Support  - 1 x daily - 7 x weekly - 3 sets - 15 seconds hold - Standing Heel Raises  - 1 x daily - 7 x weekly - 3 sets - 10 reps   AQUATIC Access Code: [email or web address] URL: [email or web address] Date: 09/30/2022 Prepared by: [facility]  Access Code: [email or web address] URL: [email or web address] Date: 10/01/2022 Prepared by: Geni Bers  This aquatic home exercise program from MedBridge utilizes pictures from land based exercises, but has been adapted prior to lamination and issuance.   Exercises - Walking March  - 1 x daily - 7 x weekly - Heel Toe Raises with Counter Support  - 1 x daily - 7 x weekly - 1 sets - 10-20 reps - Side Stepping with Hand Floats  - 1 x daily - 7 x weekly - Walking Tandem Stance  - 1 x daily - 7  x weekly - Single Leg Stance with 3-Way Kick  - 1 x daily - 7 x weekly - 1 sets - 10 reps - Braided Sidestepping with Arms Out  - 1 x daily - 7 x weekly - Seated Straddle on Noodle Breast Stroke Arms and Bicycle Legs Forward  - 1 x daily - 7 x  weekly - Wide Stance on Noodle with Arm Raise  - 1 x daily - 7 x weekly - 1 sets - 5-10 reps - Forward Step Down  - 1 x daily - 7 x weekly - 1-2 sets - 10 reps - Gastroc Stretch on Step  - 1 x daily - 7 x weekly - 1 sets - 2 reps - 15 seconds  hold - Standing Soleus Stretch  - 1 x daily - 7 x weekly - 1 sets - 2 reps - 15 seconds hold   ASSESSMENT:  CLINICAL IMPRESSION: Pt has attended 7 visits of PT and has made steady progress. Pt has met most goals and reached maximum level of improvement in PT. Has met 3/5 STGs and LTG. Pt reports improvements in ADLs and work activities.  She will be discharged to aquatic and land HEP. Provided pt with land HEP today with good understanding of exercises noted.   OBJECTIVE IMPAIRMENTS: decreased strength, obesity, and pain.   ACTIVITY LIMITATIONS: standing, stairs, and locomotion level  PARTICIPATION LIMITATIONS: community activity and occupation  PERSONAL FACTORS: 1-2 comorbidities: morbid obesity, PE  are also affecting patient's functional outcome.   REHAB POTENTIAL: Good  CLINICAL DECISION MAKING: Evolving/moderate complexity  EVALUATION COMPLEXITY: Moderate   GOALS: Goals reviewed with patient? Yes  SHORT TERM GOALS: Target date: 5/28 Pt will tolerate full aquatic sessions consistently without increase in pain and with improving function to demonstrate good toleration and effectiveness of intervention.   Baseline: Goal status: Met 09/24/22  2.  Pt will report no increase in left foot pain after work day/end of day Baseline: 4/10 on average Goal status: Ongoing 09/30/22  3.  Pt to meet stated Foto Goal of 66% Baseline: 52% Goal status: Ongoing 09/30/22:  Not met 62% 10/01/22  4.  Antalgic gait pattern to resolve to normal pattern. Baseline: offloading right/antalgic Goal status: Met 10/01/22  5.  Pt will be indep with final aquatic HEP for continued management of condition . Baseline: none Goal status: Met 10/01/22    LONG TERM  GOALS: Target date: 10/24/22       1. Pt will be indep with land based HEP for continued management of condition when no access to pool.    Baseline: none    Goal status: MET 5/30  PLAN:  PT FREQUENCY: 1-2x/week  PT DURATION: 4 weeks  PLANNED INTERVENTIONS: Therapeutic exercises, Therapeutic activity, Neuromuscular re-education, Balance training, Gait training, Patient/Family education, Self Care, Joint mobilization, Stair training, DME instructions, Aquatic Therapy, Dry Needling, Electrical stimulation, Moist heat, Splintting, Taping, Ultrasound, Ionotophoresis 4mg /ml Dexamethasone, Manual therapy, and Re-evaluation  PLAN FOR NEXT SESSION: Aquatic therapy for strengthening and ROM bilat ankle/feet, knees. Gait training. Land based HEP for ankle/knee strength and stretching  Riki Altes, PTA  10/10/22 10:59 AM [facility] [address] [place], [place], [address] Phone: [phone]   Fax:  [phone]  Addend Rushie Chestnut) Z[place] MPT 10/21/22 936a

## 2022-10-09 NOTE — Progress Notes (Signed)
Attempted to obtain medical history via telephone, unable to reach at this time. HIPAA compliant voicemail message left requesting return call to pre surgical testing department. 

## 2022-10-10 ENCOUNTER — Telehealth: Payer: Self-pay | Admitting: *Deleted

## 2022-10-10 ENCOUNTER — Other Ambulatory Visit: Payer: Self-pay | Admitting: Hematology and Oncology

## 2022-10-10 ENCOUNTER — Ambulatory Visit (HOSPITAL_BASED_OUTPATIENT_CLINIC_OR_DEPARTMENT_OTHER): Payer: Self-pay | Admitting: Physical Therapy

## 2022-10-10 MED ORDER — ENOXAPARIN SODIUM 120 MG/0.8ML IJ SOSY: 120.0000 mg | PREFILLED_SYRINGE | Freq: Two times a day (BID) | INTRAMUSCULAR | 0 refills | Status: DC

## 2022-10-10 NOTE — Telephone Encounter (Signed)
Received call from [facility]. She states that pt is having an EGD on 10/15/22 and will need lovenox bridge order as she is on Xarelto. Tiffany states that Dr. Leonides Schanz needs to place the order for lovenox as their providers do not do that.

## 2022-10-10 NOTE — Telephone Encounter (Signed)
TCT patient regarding an new prescription for her in preparation for her EGD on 10/15/22. Spoke with her and advised that Dr. Leonides Schanz has sent in a prescription for Lovenox pt is to take twice a day for 2 days prior to her nEGD on 10/15/22. Spoke with her and advised her of the above. She will pick it up today. She has used Lovenox in the past and understands how to do the injection. Advised to stop Xarelto after 2nd dose on 10/12/22, do the lovenox 2 x a day for 6/3 and 6/4. No anticoagulants on 6/5 prior to EGD. She can resume Xarelto the evening after her EGD if GI provider advises her of no bleeding issues after EGD. Pt voiced understanding.

## 2022-10-13 ENCOUNTER — Ambulatory Visit (HOSPITAL_BASED_OUTPATIENT_CLINIC_OR_DEPARTMENT_OTHER): Payer: Commercial Managed Care - PPO | Admitting: Physical Therapy

## 2022-10-14 NOTE — Anesthesia Preprocedure Evaluation (Signed)
Anesthesia Evaluation  Patient identified by MRN, date of birth, ID band Patient awake    Reviewed: Allergy & Precautions, NPO status , Patient's Chart, lab work & pertinent test results  History of Anesthesia Complications (+) DIFFICULT IV STICK / SPECIAL LINE  Airway Mallampati: II  TM Distance: >3 FB Neck ROM: Full    Dental  (+) Dental Advisory Given   Pulmonary sleep apnea and Continuous Positive Airway Pressure Ventilation , COPD,  COPD inhaler, PE (2019)   breath sounds clear to auscultation       Cardiovascular hypertension, Pt. on medications (-) angina + Peripheral Vascular Disease  + Valvular Problems/Murmurs (bicuspid AV)  Rhythm:Regular Rate:Normal  '22 ECHO: EF 60-65%.  1. The LV has normal function, no regional wall motion abnormalities. Left ventricular diastolic parameters were normal.  2. RVF is normal. The right ventricular size is normal. There is normal pulmonary artery systolic pressure.   3. The mitral valve is normal in structure. Trivial MR. No evidence of mitral stenosis.   4. Partial fusion of right and left coronary cusp. The aortic valve is bicuspid. Aortic valve regurgitation is mild to moderate. No AS is present. Aortic regurgitation PHT measures 657 msec.   5. Aortic dilatation noted. There is mild dilatation of the ascending  aorta, measuring 42 mm.     Neuro/Psych   Anxiety Depression    negative neurological ROS     GI/Hepatic Neg liver ROS,GERD  Medicated and Controlled,,  Endo/Other  diabetes (glu 81), Oral Hypoglycemic Agents  Morbid obesityBMI 46.6  Renal/GU H/o stones     Musculoskeletal  (+) Arthritis ,    Abdominal  (+) + obese  Peds  Hematology xarelto   Anesthesia Other Findings   Reproductive/Obstetrics                             Anesthesia Physical Anesthesia Plan  ASA: 3  Anesthesia Plan: MAC   Post-op Pain Management: Minimal or no  pain anticipated   Induction:   PONV Risk Score and Plan: 2 and Treatment may vary due to age or medical condition  Airway Management Planned: Natural Airway and Nasal Cannula  Additional Equipment: None  Intra-op Plan:   Post-operative Plan:   Informed Consent: I have reviewed the patients History and Physical, chart, labs and discussed the procedure including the risks, benefits and alternatives for the proposed anesthesia with the patient or authorized representative who has indicated his/her understanding and acceptance.     Dental advisory given  Plan Discussed with: CRNA and Surgeon  Anesthesia Plan Comments:         Anesthesia Quick Evaluation

## 2022-10-15 ENCOUNTER — Ambulatory Visit (HOSPITAL_COMMUNITY)
Admission: RE | Admit: 2022-10-15 | Discharge: 2022-10-15 | Disposition: A | Discharge status: Home / Self Care | Payer: Commercial Managed Care - PPO | Attending: Gastroenterology | Admitting: Gastroenterology

## 2022-10-15 ENCOUNTER — Encounter (HOSPITAL_COMMUNITY): Payer: Self-pay | Admitting: Gastroenterology

## 2022-10-15 ENCOUNTER — Ambulatory Visit (HOSPITAL_BASED_OUTPATIENT_CLINIC_OR_DEPARTMENT_OTHER): Payer: Commercial Managed Care - PPO | Admitting: Anesthesiology

## 2022-10-15 ENCOUNTER — Other Ambulatory Visit: Payer: Self-pay | Admitting: Obstetrics and Gynecology

## 2022-10-15 ENCOUNTER — Ambulatory Visit (HOSPITAL_COMMUNITY): Payer: Commercial Managed Care - PPO | Admitting: Anesthesiology

## 2022-10-15 ENCOUNTER — Other Ambulatory Visit: Payer: Self-pay

## 2022-10-15 ENCOUNTER — Encounter (HOSPITAL_COMMUNITY)
Admission: RE | Disposition: A | Discharge status: Home / Self Care | Payer: Self-pay | Source: Home / Self Care | Attending: Gastroenterology

## 2022-10-15 DIAGNOSIS — R928 Other abnormal and inconclusive findings on diagnostic imaging of breast: Secondary | ICD-10-CM

## 2022-10-15 DIAGNOSIS — I10 Essential (primary) hypertension: Secondary | ICD-10-CM

## 2022-10-15 DIAGNOSIS — E1151 Type 2 diabetes mellitus with diabetic peripheral angiopathy without gangrene: Secondary | ICD-10-CM

## 2022-10-15 DIAGNOSIS — R1310 Dysphagia, unspecified: Secondary | ICD-10-CM | POA: Diagnosis present

## 2022-10-15 DIAGNOSIS — K449 Diaphragmatic hernia without obstruction or gangrene: Secondary | ICD-10-CM

## 2022-10-15 DIAGNOSIS — J449 Chronic obstructive pulmonary disease, unspecified: Secondary | ICD-10-CM

## 2022-10-15 DIAGNOSIS — K222 Esophageal obstruction: Secondary | ICD-10-CM | POA: Diagnosis not present

## 2022-10-15 DIAGNOSIS — Z7984 Long term (current) use of oral hypoglycemic drugs: Secondary | ICD-10-CM

## 2022-10-15 HISTORY — PX: ESOPHAGOGASTRODUODENOSCOPY (EGD) WITH PROPOFOL: SHX5813

## 2022-10-15 LAB — GLUCOSE, CAPILLARY: Glucose-Capillary: 81 mg/dL (ref 70–99)

## 2022-10-15 SURGERY — ESOPHAGOGASTRODUODENOSCOPY (EGD) WITH PROPOFOL
Anesthesia: Monitor Anesthesia Care | Laterality: Bilateral

## 2022-10-15 MED ORDER — RIVAROXABAN 20 MG PO TABS: 20.0000 mg | ORAL_TABLET | Freq: Every day | ORAL | 0 refills | Status: AC

## 2022-10-15 MED ORDER — PROPOFOL 500 MG/50ML IV EMUL
INTRAVENOUS | Status: AC
  Filled 2022-10-15: qty 50

## 2022-10-15 MED ORDER — SODIUM CHLORIDE 0.9 % IV SOLN: INTRAVENOUS | Status: DC

## 2022-10-15 MED ORDER — PROPOFOL 500 MG/50ML IV EMUL
INTRAVENOUS | Status: DC | PRN
  Administered 2022-10-15: 150 ug/kg/min via INTRAVENOUS

## 2022-10-15 MED ORDER — LIDOCAINE 2% (20 MG/ML) 5 ML SYRINGE
INTRAMUSCULAR | Status: DC | PRN
  Administered 2022-10-15: 100 mg via INTRAVENOUS

## 2022-10-15 MED ORDER — PROPOFOL 10 MG/ML IV BOLUS
INTRAVENOUS | Status: DC | PRN
  Administered 2022-10-15: 10 mg via INTRAVENOUS
  Administered 2022-10-15: 40 mg via INTRAVENOUS

## 2022-10-15 MED ORDER — ONDANSETRON HCL 4 MG/2ML IJ SOLN
INTRAMUSCULAR | Status: DC | PRN
  Administered 2022-10-15: 4 mg via INTRAVENOUS

## 2022-10-15 MED ORDER — LACTATED RINGERS IV SOLN: INTRAVENOUS | Status: DC

## 2022-10-15 MED ORDER — PROPOFOL 1000 MG/100ML IV EMUL
INTRAVENOUS | Status: AC
  Filled 2022-10-15: qty 100

## 2022-10-15 SURGICAL SUPPLY — 15 items

## 2022-10-15 NOTE — Op Note (Signed)
[facility] Patient Name: Erika Woods Procedure Date: 10/15/2022 MRN: [id number] Attending MD: Willis Modena , MD, [id number] Date of Birth: [birth year] CSN: [id number] Age: 57 Admit Type: Outpatient Procedure:                Upper GI endoscopy Indications:              Dysphagia Providers:                Willis Modena, MD, Kandice Robinsons, Technician,                            Fransisca Connors Referring MD:              Medicines:                Monitored Anesthesia Care Complications:            No immediate complications. Estimated Blood Loss:     Estimated blood loss: none. Procedure:                Pre-Anesthesia Assessment:                           - Prior to the procedure, a History and Physical                            was performed, and patient medications and                            allergies were reviewed. The patient's tolerance of                            previous anesthesia was also reviewed. The risks                            and benefits of the procedure and the sedation                            options and risks were discussed with the patient.                            All questions were answered, and informed consent                            was obtained. Prior Anticoagulants: The patient has                            taken Lovenox (enoxaparin), last dose was 1 day                            prior to procedure; on Xarelto, last dose few days                            ago. After reviewing the risks and benefits, the  patient was deemed in satisfactory condition to                            undergo the procedure.                           After obtaining informed consent, the endoscope was                            passed under direct vision. Throughout the                            procedure, the patient's blood pressure, pulse, and                            oxygen saturations were monitored  continuously. The                            [id number]-[id number] ([id number]) Olympus endoscope was introduced                            through the mouth, and advanced to the second part                            of duodenum. The upper GI endoscopy was                            accomplished without difficulty. The patient                            tolerated the procedure well. Scope In: Scope Out: Findings:      A medium-sized hiatal hernia was present.      One benign-appearing, intrinsic mild stenosis was found. The stenosis       was traversed. A TTS dilator was passed through the scope. Dilation with       a 15-16.5-18 mm balloon dilator was performed to 18 mm.      The exam of the esophagus was otherwise normal.      The entire examined stomach was normal.      The duodenal bulb, first portion of the duodenum and second portion of       the duodenum were normal. Impression:               - Medium-sized hiatal hernia.                           - Benign-appearing esophageal stenosis. Dilated.                           - Normal stomach.                           - Normal duodenal bulb, first portion of the                            duodenum and second portion of the duodenum.                           -  No specimens collected. Moderate Sedation:      None Recommendation:           - Discharge patient to home (via wheelchair).                           - Soft diet today.                           - Continue present medications.                           - Resume Lovenox (enoxaparin) today and Xarelto                            (rivaroxaban) tomorrow at prior doses.                           - Return to GI clinic at appointment to be                            scheduled. Procedure Code(s):        --- Professional ---                           [phone], Esophagogastroduodenoscopy, flexible,                            transoral; with transendoscopic balloon dilation of                             esophagus (less than 30 mm diameter) Diagnosis Code(s):        --- Professional ---                           K44.9, Diaphragmatic hernia without obstruction or                            gangrene                           K22.2, Esophageal obstruction                           R13.10, Dysphagia, unspecified CPT copyright 2022 [employer]. All rights reserved. The codes documented in this report are preliminary and upon coder review may  be revised to meet current compliance requirements. Willis Modena, MD 10/15/2022 9:19:20 AM This report has been signed electronically. Number of Addenda: 0

## 2022-10-15 NOTE — H&P (Signed)
[facility] H/P Note  Chief Complaint: dysphagia  HPI: Erika Woods is an 57 y.o. female.  Dysphagia, esophageal stricture, here egd possible dilatation.  Past Medical History:  Diagnosis Date   Acute pulmonary embolism ([name]) 11/11/2018   Acute pulmonary embolism with acute cor pulmonale ([name]) 09/16/2017   Acute superficial venous thrombosis of left lower extremity    Aortic insufficiency    Echocardiogram 08/2019: prob bicuspid AoV, mild to mod AI, mild AS (mean 12 mmHg), EF 55-60, no RWMA, Gr 1 DD, GLS -22.2%, normal RVSF, Ao Root 39 mm   Coronary CTA    Coronary CTA 08/2019: Calcium score 0, no evidence of CAD, ascending aorta 4 cm   Diabetes mellitus without complication ([name])    DM (diabetes mellitus) ([name]) 08/18/2011   GERD (gastroesophageal reflux disease)    Heart murmur    History of kidney stones    Impaired fasting glucose    Mood disorder ([name]) 08/18/2011   Obesity, Class III, BMI 40-49.9 (morbid obesity) ([name]) 09/16/2017   Obstructive sleep apnea on CPAP    Osteoarthritis of knee    Personal history of pulmonary embolism    PTSD (post-traumatic stress disorder) 08/18/2011   Pulmonary emboli ([name]) 09/15/2017   Renal disorder    kideny stones   Restless leg syndrome    Severe recurrent major depression without psychotic features ([name]) 02/17/2018   Sleep apnea 08/18/2011   Thoracic aortic aneurysm ([name])    Thoracic aortic aneurysm without rupture ([name])    Urinary tract infection    hx of chronic uti per pt   UTI (urinary tract infection)    recurrent    Past Surgical History:  Procedure Laterality Date   arm surgery Bilateral Skin removal   DILATATION & CURETTAGE/HYSTEROSCOPY WITH MYOSURE N/A 06/11/2018   Procedure: DILATATION & CURETTAGE/HYSTEROSCOPY WITH MYOSURE RESECTION OF ENDOMETRIAL THICKENING;  Surgeon: Richardean Chimera, MD;  Location: [facility];  Service: Gynecology;  Laterality: N/A;  BMI 56   DILATION AND EVACUATION     ESOPHAGOGASTRODUODENOSCOPY  (EGD) WITH PROPOFOL N/A 07/15/2018   Procedure: ESOPHAGOGASTRODUODENOSCOPY (EGD) WITH PROPOFOL;  Surgeon: Willis Modena, MD;  Location: [facility];  Service: Endoscopy;  Laterality: N/A;   ESOPHAGOGASTRODUODENOSCOPY (EGD) WITH PROPOFOL N/A 04/27/2019   Procedure: ESOPHAGOGASTRODUODENOSCOPY (EGD) WITH PROPOFOL;  Surgeon: Willis Modena, MD;  Location: [facility];  Service: Endoscopy;  Laterality: N/A;   ESOPHAGOGASTRODUODENOSCOPY (EGD) WITH PROPOFOL Bilateral 04/02/2022   Procedure: ESOPHAGOGASTRODUODENOSCOPY (EGD) WITH PROPOFOL;  Surgeon: Willis Modena, MD;  Location: [facility];  Service: Gastroenterology;  Laterality: Bilateral;   IR ABLATE LIVER CRYOABLATION  10/28/2019   IR ABLATE LIVER CRYOABLATION  05/07/2020   IR RADIOLOGIST EVAL & MGMT  10/14/2019   IR RADIOLOGIST EVAL & MGMT  04/03/2020   KIDNEY STONE SURGERY     KNEE SURGERY Right    LUMBAR LAMINECTOMY/DECOMPRESSION MICRODISCECTOMY N/A 11/08/2020   Procedure: LUMBAR FIVE AND SACRAL ONE LAMINECTOMY/DECOMPRESSION MICRODISCECTOMY 1 LEVEL;  Surgeon: Venita Lick, MD;  Location: [facility];  Service: Orthopedics;  Laterality: N/A;   TONSILLECTOMY     TOTAL KNEE ARTHROPLASTY Right 11/05/2021   Procedure: RIGHT TOTAL KNEE ARTHROPLASTY;  Surgeon: Marcene Corning, MD;  Location: [facility];  Service: Orthopedics;  Laterality: Right;    Medications Prior to Admission  Medication Sig Dispense Refill   acetaminophen (TYLENOL) 500 MG tablet Take 1,000-1,500 mg by mouth every 8 (eight) hours as needed for moderate pain.     cloNIDine (CATAPRES) 0.3 MG tablet Take 0.3 mg by mouth at bedtime.  clotrimazole-betamethasone (LOTRISONE) cream Apply 1 Application topically 2 (two) times daily as needed (rash).     Dextromethorphan-buPROPion ER (AUVELITY) 45-105 MG TBCR Take 1 tablet by mouth 2 (two) times daily.     enoxaparin (LOVENOX) 120 MG/0.8ML injection Inject 0.8 mLs (120 mg total) into the skin every 12 (twelve) hours. Take last dose of Xarelto  on 10/12/2022 and start lovenox on 10/13/2022. Take last dose of lovenox at night on 10/14/2022. Can restart Xarelto after procedure on 10/15/2022. 5 mL 0   fluticasone (FLONASE) 50 MCG/ACT nasal spray Place 1 spray into both nostrils 2 (two) times daily as needed for allergies.     gabapentin (NEURONTIN) 300 MG capsule Take 300-600 mg by mouth See admin instructions. Taking 2 capsules (600 mg) in the am and 300 mg at bedtime     ipratropium (ATROVENT) 0.03 % nasal spray Place 2 sprays into both nostrils at bedtime as needed for allergies.     lamoTRIgine (LAMICTAL) 150 MG tablet Take 150 mg by mouth daily after breakfast.      lansoprazole (PREVACID) 30 MG capsule Take 30 mg by mouth 2 (two) times daily before a meal.     metFORMIN (GLUCOPHAGE) 500 MG tablet Take 500 mg by mouth 2 (two) times daily with a meal.      ondansetron (ZOFRAN) 8 MG tablet Take 8 mg by mouth every 8 (eight) hours as needed for nausea or vomiting.     oxymetazoline (AFRIN) 0.05 % nasal spray Place 1 spray into both nostrils 2 (two) times daily as needed for congestion.     rivaroxaban (XARELTO) 20 MG TABS tablet Take 1 tablet (20 mg total) by mouth daily with supper. 30 tablet 0   rOPINIRole (REQUIP) 4 MG tablet Take 12 mg by mouth at bedtime.     TRULICITY 1.5 MG/0.5ML SOPN Inject 1.5 mg into the skin once a week.     Vitamin D, Ergocalciferol, (DRISDOL) 1.25 MG (50000 UNIT) CAPS capsule Take 50,000 Units by mouth every Monday.     albuterol (VENTOLIN HFA) 108 (90 Base) MCG/ACT inhaler Inhale 2 puffs into the lungs every 6 (six) hours as needed for wheezing or shortness of breath.     amoxicillin (AMOXIL) 500 MG capsule Take 2,000 mg by mouth See admin instructions. 1 hour before dental procedures     Budeson-Glycopyrrol-Formoterol (BREZTRI AEROSPHERE) 160-9-4.8 MCG/ACT AERO Inhale 1 puff into the lungs daily as needed (Asthma).      Allergies:  Allergies  Allergen Reactions   Sulfa Antibiotics Hives   Keflex [Cephalexin]  Itching    Family History  Adopted: Yes    Social History:  reports that she has never smoked. She has never used smokeless tobacco. She reports that she does not drink alcohol and does not use drugs.   ROS:As per HPI, all others negative   Blood pressure (!) 134/94, pulse 65, temperature (!) 97.1 F (36.2 C), temperature source Temporal, resp. rate 14, height 5\' 5"  (1.651 m), weight 127 kg, last menstrual period 01/29/2013, SpO2 97 %. General appearance: NAD CV:  Regular RESP:  No distress ABD:  Soft, non-tender NEURO:  No encephalopathy  Results for orders placed or performed during the hospital encounter of 10/15/22 (from the past 48 hour(s))  Glucose, capillary     Status: None   Collection Time: 10/15/22  8:31 AM  Result Value Ref Range   Glucose-Capillary 81 70 - 99 mg/dL    Comment: Glucose reference range applies only to samples taken  after fasting for at least 8 hours.   No results found.  Assessment/Plan   Dysphagia. Esophageal stricture. Esophagogastroduodenoscopy with possible dilatation. Risks (bleeding, infection, bowel perforation that could require surgery, sedation-related changes in cardiopulmonary systems), benefits (identification and possible treatment of source of symptoms, exclusion of certain causes of symptoms), and alternatives (watchful waiting, radiographic imaging studies, empiric medical treatment) of upper endoscopy with possible dilatation (EGD +/- DIL) were explained to patient/family in detail and patient wishes to proceed.   Freddy Jaksch 10/15/2022, 8:38 AM

## 2022-10-15 NOTE — Anesthesia Procedure Notes (Signed)
Procedure Name: MAC Date/Time: 10/15/2022 8:44 AM  Performed by: Orest Dikes, CRNAPre-anesthesia Checklist: Patient identified, Emergency Drugs available, Suction available and Patient being monitored Oxygen Delivery Method: Simple face mask

## 2022-10-15 NOTE — Anesthesia Postprocedure Evaluation (Signed)
Anesthesia Post Note  Patient: Erika Woods  Procedure(s) Performed: ESOPHAGOGASTRODUODENOSCOPY (EGD) WITH PROPOFOL (Bilateral) Balloon dilation wire-guided     Patient location during evaluation: Endoscopy Anesthesia Type: MAC Level of consciousness: awake and alert, patient cooperative and oriented Pain management: pain level controlled Vital Signs Assessment: post-procedure vital signs reviewed and stable Respiratory status: spontaneous breathing, nonlabored ventilation and respiratory function stable Cardiovascular status: blood pressure returned to baseline and stable Postop Assessment: adequate PO intake and no apparent nausea or vomiting Anesthetic complications: no   No notable events documented.  Last Vitals:  Vitals:   10/15/22 0907 10/15/22 0912  BP: 116/63 99/61  Pulse: 69 66  Resp: 17 19  Temp: 37.1 C   SpO2: 100% 95%    Last Pain:  Vitals:   10/15/22 0912  TempSrc:   PainSc: 0-No pain                 Parks Czajkowski,E. Amory Simonetti

## 2022-10-15 NOTE — Transfer of Care (Signed)
Immediate Anesthesia Transfer of Care Note  Patient: Erika Woods  Procedure(s) Performed: ESOPHAGOGASTRODUODENOSCOPY (EGD) WITH PROPOFOL (Bilateral) Balloon dilation wire-guided  Patient Location: PACU  Anesthesia Type:MAC  Level of Consciousness: awake, alert , and oriented  Airway & Oxygen Therapy: Patient Spontanous Breathing and Patient connected to face mask oxygen  Post-op Assessment: Report given to RN and Post -op Vital signs reviewed and stable  Post vital signs: Reviewed and stable  Last Vitals:  Vitals Value Taken Time  BP    Temp    Pulse 69 10/15/22 0908  Resp 17 10/15/22 0908  SpO2 100 % 10/15/22 0908  Vitals shown include unvalidated device data.  Last Pain:  Vitals:   10/15/22 0816  TempSrc: Temporal  PainSc: 0-No pain         Complications: No notable events documented.

## 2022-10-15 NOTE — Discharge Instructions (Signed)
YOU HAD AN ENDOSCOPIC PROCEDURE TODAY: Refer to the procedure report and other information in the discharge instructions given to you for any specific questions about what was found during the examination. If this information does not answer your questions, please call [facility] office at [phone] to clarify.   YOU SHOULD EXPECT: Some feelings of bloating in the abdomen. Passage of more gas than usual. Walking can help get rid of the air that was put into your GI tract during the procedure and reduce the bloating. If you had a lower endoscopy (such as a colonoscopy or flexible sigmoidoscopy) you may notice spotting of blood in your stool or on the toilet paper. Some abdominal soreness may be present for a day or two, also.  DIET: Your first meal following the procedure should be a light meal and then it is ok to progress to your normal diet. A half-sandwich or bowl of soup is an example of a good first meal. Heavy or fried foods are harder to digest and may make you feel nauseous or bloated. Drink plenty of fluids but you should avoid alcoholic beverages for 24 hours. If you had a esophageal dilation, please see attached instructions for diet.    ACTIVITY: Your care partner should take you home directly after the procedure. You should plan to take it easy, moving slowly for the rest of the day. You can resume normal activity the day after the procedure however YOU SHOULD NOT DRIVE, use power tools, machinery or perform tasks that involve climbing or major physical exertion for 24 hours (because of the sedation medicines used during the test).   SYMPTOMS TO REPORT IMMEDIATELY: A gastroenterologist can be reached at any hour. Please call [phone]  for any of the following symptoms:  Following lower endoscopy (colonoscopy, flexible sigmoidoscopy) Excessive amounts of blood in the stool  Significant tenderness, worsening of abdominal pains  Swelling of the abdomen that is new, acute  Fever of 100  or higher  Following upper endoscopy (EGD, EUS, ERCP, esophageal dilation) Vomiting of blood or coffee ground material  New, significant abdominal pain  New, significant chest pain or pain under the shoulder blades  Painful or persistently difficult swallowing  New shortness of breath  Black, tarry-looking or red, bloody stools  FOLLOW UP:  If any biopsies were taken you will be contacted by phone or by letter within the next 1-3 weeks. Call [phone]  if you have not heard about the biopsies in 3 weeks.  Please also call with any specific questions about appointments or follow up tests. YOU HAD AN ENDOSCOPIC PROCEDURE TODAY: Refer to the procedure report and other information in the discharge instructions given to you for any specific questions about what was found during the examination. If this information does not answer your questions, please call [facility] office at [phone] to clarify.   YOU SHOULD EXPECT: Some feelings of bloating in the abdomen. Passage of more gas than usual. Walking can help get rid of the air that was put into your GI tract during the procedure and reduce the bloating. If you had a lower endoscopy (such as a colonoscopy or flexible sigmoidoscopy) you may notice spotting of blood in your stool or on the toilet paper. Some abdominal soreness may be present for a day or two, also.  DIET: Your first meal following the procedure should be a light meal and then it is ok to progress to your normal diet. A half-sandwich or bowl of soup is an   example of a good first meal. Heavy or fried foods are harder to digest and may make you feel nauseous or bloated. Drink plenty of fluids but you should avoid alcoholic beverages for 24 hours. If you had a esophageal dilation, please see attached instructions for diet.    ACTIVITY: Your care partner should take you home directly after the procedure. You should plan to take it easy, moving slowly for the rest of the day. You can resume  normal activity the day after the procedure however YOU SHOULD NOT DRIVE, use power tools, machinery or perform tasks that involve climbing or major physical exertion for 24 hours (because of the sedation medicines used during the test).   SYMPTOMS TO REPORT IMMEDIATELY: A gastroenterologist can be reached at any hour. Please call [phone]  for any of the following symptoms:   Following upper endoscopy (EGD, esophageal dilation) Vomiting of blood or coffee ground material  New, significant abdominal pain  New, significant chest pain or pain under the shoulder blades  Painful or persistently difficult swallowing  New shortness of breath  Black, tarry-looking or red, bloody stools  FOLLOW UP:  If any biopsies were taken you will be contacted by phone or by letter within the next 1-3 weeks. Call [phone]  if you have not heard about the biopsies in 3 weeks.  Please also call with any specific questions about appointments or follow up tests. 

## 2022-10-19 ENCOUNTER — Encounter (HOSPITAL_COMMUNITY): Payer: Self-pay | Admitting: Gastroenterology

## 2022-10-28 ENCOUNTER — Ambulatory Visit: Payer: Commercial Managed Care - PPO

## 2022-10-28 ENCOUNTER — Ambulatory Visit
Admission: RE | Admit: 2022-10-28 | Discharge: 2022-10-28 | Disposition: A | Discharge status: Home / Self Care | Payer: Commercial Managed Care - PPO | Source: Ambulatory Visit | Attending: Obstetrics and Gynecology | Admitting: Obstetrics and Gynecology

## 2022-10-28 DIAGNOSIS — R928 Other abnormal and inconclusive findings on diagnostic imaging of breast: Secondary | ICD-10-CM

## 2022-12-23 ENCOUNTER — Encounter: Payer: Self-pay | Admitting: Nurse Practitioner

## 2022-12-23 ENCOUNTER — Ambulatory Visit: Payer: Commercial Managed Care - PPO | Admitting: Nurse Practitioner

## 2022-12-23 VITALS — BP 124/77 | HR 67 | Temp 97.8°F | Ht 65.0 in | Wt 274.0 lb

## 2022-12-23 DIAGNOSIS — E1169 Type 2 diabetes mellitus with other specified complication: Secondary | ICD-10-CM

## 2022-12-23 DIAGNOSIS — Z6841 Body Mass Index (BMI) 40.0 and over, adult: Secondary | ICD-10-CM | POA: Diagnosis not present

## 2022-12-23 DIAGNOSIS — G4730 Sleep apnea, unspecified: Secondary | ICD-10-CM

## 2022-12-23 DIAGNOSIS — Z7985 Long-term (current) use of injectable non-insulin antidiabetic drugs: Secondary | ICD-10-CM

## 2022-12-23 NOTE — Patient Instructions (Signed)

## 2022-12-23 NOTE — Progress Notes (Signed)
Office: [phone]  /  Fax: [phone]   Initial Visit  Erika Woods was seen in clinic today to evaluate for obesity. She is interested in losing weight to improve overall health and reduce the risk of weight related complications. She presents today to review program treatment options, initial physical assessment, and evaluation.     She was referred by: PCP  When asked what else they would like to accomplish? She states: Improve existing medical conditions and Lose a target amount of weight : <200 lbs  When asked how has your weight affected you? She states: Contributed to orthopedic problems or mobility issues, Having fatigue, and Having poor endurance  Some associated conditions: PE, thoracic aortic aneurysm, OSAS on CPAP, cirrhosis of liver, DMT2, lumbar disc herniation, CKD, MDD, PTSD, RLS  Contributing factors: Patient is adopted.    Weight promoting medications identified: None  Current nutrition plan: None  Current level of physical activity: Walking  Current or previous pharmacotherapy: GLP-1-nausea, currently taking Trulicity 3mg   Response to medication: currently taking.     Past medical history includes:   Past Medical History:  Diagnosis Date   Acute pulmonary embolism ([name]) 11/11/2018   Acute pulmonary embolism with acute cor pulmonale ([name]) 09/16/2017   Acute superficial venous thrombosis of left lower extremity    Aortic insufficiency    Echocardiogram 08/2019: prob bicuspid AoV, mild to mod AI, mild AS (mean 12 mmHg), EF 55-60, no RWMA, Gr 1 DD, GLS -22.2%, normal RVSF, Ao Root 39 mm   Coronary CTA    Coronary CTA 08/2019: Calcium score 0, no evidence of CAD, ascending aorta 4 cm   Diabetes mellitus without complication ([name])    DM (diabetes mellitus) ([name]) 08/18/2011   GERD (gastroesophageal reflux disease)    Heart murmur    History of kidney stones    Impaired fasting glucose    Mood disorder ([name]) 08/18/2011   Obesity, Class III, BMI 40-49.9  (morbid obesity) ([name]) 09/16/2017   Obstructive sleep apnea on CPAP    Osteoarthritis of knee    Personal history of pulmonary embolism    PTSD (post-traumatic stress disorder) 08/18/2011   Pulmonary emboli ([name]) 09/15/2017   Renal disorder    kideny stones   Restless leg syndrome    Severe recurrent major depression without psychotic features ([name]) 02/17/2018   Sleep apnea 08/18/2011   Thoracic aortic aneurysm [facility])    Thoracic aortic aneurysm without rupture ([name])    Urinary tract infection    hx of chronic uti per pt   UTI (urinary tract infection)    recurrent     Objective:   BP 124/77   Pulse 67   Temp 97.8 F (36.6 C)   Ht 5\' 5"  (1.651 m)   Wt 274 lb (124.3 kg)   LMP 01/29/2013   SpO2 97%   BMI 45.60 kg/m  She was weighed on the bioimpedance scale: Body mass index is 45.6 kg/m.  Peak Weight:335 lbs , Body Fat%:55.5%, Visceral Fat Rating:19, Weight trend over the last 12 months: Decreasing  General:  Alert, oriented and cooperative. Patient is in no acute distress.  Respiratory: Normal respiratory effort, no problems with respiration noted   Gait: able to ambulate independently  Mental Status: Normal mood and affect. Normal behavior. Normal judgment and thought content.   DIAGNOSTIC DATA REVIEWED:  BMET    Component Value Date/Time   NA 137 08/11/2022 1720   NA 141 04/18/2020 1326   K 4.1 08/11/2022 1720  CL 102 08/11/2022 1720   CO2 26 08/11/2022 1720   GLUCOSE 133 (H) 08/11/2022 1720   BUN 16 08/11/2022 1720   BUN 15 04/18/2020 1326   CREATININE 1.12 (H) 08/11/2022 1720   CREATININE 0.90 12/19/2021 1355   CREATININE 0.98 09/25/2015 0837   CALCIUM 8.7 (L) 08/11/2022 1720   GFRNONAA 57 (L) 08/11/2022 1720   GFRNONAA >60 12/19/2021 1355   GFRNONAA 67 09/25/2015 0837   GFRAA 66 04/18/2020 1326   GFRAA 77 09/25/2015 0837   Lab Results  Component Value Date   HGBA1C 5.6 10/30/2021   HGBA1C 4.5 08/18/2011   No results found for: "INSULIN" CBC     Component Value Date/Time   WBC 7.3 08/11/2022 1720   RBC 4.43 08/11/2022 1720   HGB 12.1 08/11/2022 1720   HGB 11.7 (L) 12/19/2021 1355   HGB 12.5 04/18/2020 1326   HCT 39.5 08/11/2022 1720   HCT 37.7 04/18/2020 1326   PLT 243 08/11/2022 1720   PLT 299 12/19/2021 1355   PLT 265 04/18/2020 1326   MCV 89.2 08/11/2022 1720   MCV 85 04/18/2020 1326   MCH 27.3 08/11/2022 1720   MCHC 30.6 08/11/2022 1720   RDW 15.1 08/11/2022 1720   RDW 13.4 04/18/2020 1326   Iron/TIBC/Ferritin/ %Sat No results found for: "IRON", "TIBC", "FERRITIN", "IRONPCTSAT" Lipid Panel     Component Value Date/Time   CHOL 169 02/18/2018 0623   TRIG 113 02/18/2018 0623   HDL 43 02/18/2018 0623   CHOLHDL 3.9 02/18/2018 0623   VLDL 23 02/18/2018 0623   LDLCALC 103 (H) 02/18/2018 0623   Hepatic Function Panel     Component Value Date/Time   PROT 7.6 08/11/2022 1720   PROT 6.6 04/18/2020 1326   ALBUMIN 4.0 08/11/2022 1720   ALBUMIN 4.2 04/18/2020 1326   AST 22 08/11/2022 1720   AST 13 (L) 12/19/2021 1355   ALT 22 08/11/2022 1720   ALT 10 12/19/2021 1355   ALKPHOS 99 08/11/2022 1720   BILITOT 0.4 08/11/2022 1720   BILITOT 0.5 12/19/2021 1355   BILIDIR <0.1 08/11/2022 1720   IBILI NOT CALCULATED 08/11/2022 1720      Component Value Date/Time   TSH 1.760 02/18/2018 0623   TSH 1.407 08/18/2011 0923     Assessment and Plan:   Sleep apnea, unspecified type Continue CPAP nightly  Type 2 diabetes mellitus with other specified complication, without long-term current use of insulin ([name]) Continue to follow up with PCP.  Continue meds as directed.  Morbid obesity ([name])  BMI 45.0-49.9, adult ([name])        Obesity Treatment / Action Plan:  Patient will work on garnering support from family and friends to begin weight loss journey. Will work on eliminating or reducing the presence of highly palatable, calorie dense foods in the home. Will complete provided nutritional and psychosocial  assessment questionnaire before the next appointment. Will be scheduled for indirect calorimetry to determine resting energy expenditure in a fasting state.  This will allow [place] to create a reduced calorie, high-protein meal plan to promote loss of fat mass while preserving muscle mass. Counseled on the health benefits of losing 5%-15% of total body weight. Was counseled on nutritional approaches to weight loss and benefits of reducing processed foods and consuming plant-based foods and high quality protein as part of nutritional weight management. Was counseled on pharmacotherapy and role as an adjunct in weight management.   Obesity Education Performed Today:  She was weighed on the bioimpedance scale  and results were discussed and documented in the synopsis.  We discussed obesity as a disease and the importance of a more detailed evaluation of all the factors contributing to the disease.  We discussed the importance of long term lifestyle changes which include nutrition, exercise and behavioral modifications as well as the importance of customizing this to her specific health and social needs.  We discussed the benefits of reaching a healthier weight to alleviate the symptoms of existing conditions and reduce the risks of the biomechanical, metabolic and psychological effects of obesity.  Erika Woods appears to be in the action stage of change and states they are ready to start intensive lifestyle modifications and behavioral modifications.  30 minutes was spent today on this visit including the above counseling, pre-visit chart review, and post-visit documentation.  Reviewed by clinician on day of visit: allergies, medications, problem list, medical history, surgical history, family history, social history, and previous encounter notes pertinent to obesity diagnosis.    Theodis Sato  FNP-C

## 2023-01-14 ENCOUNTER — Ambulatory Visit (HOSPITAL_COMMUNITY): Payer: Commercial Managed Care - PPO | Attending: Internal Medicine

## 2023-01-14 DIAGNOSIS — I351 Nonrheumatic aortic (valve) insufficiency: Secondary | ICD-10-CM | POA: Diagnosis present

## 2023-01-14 DIAGNOSIS — I712 Thoracic aortic aneurysm, without rupture, unspecified: Secondary | ICD-10-CM | POA: Insufficient documentation

## 2023-01-14 LAB — ECHOCARDIOGRAM COMPLETE
AR max vel: 1.7 cm2
AV Area VTI: 1.53 cm2
AV Mean grad: 12.4 mmHg
AV Peak grad: 24.9 mmHg
Ao pk vel: 2.49 m/s
Area-P 1/2: 3.19 cm2
P 1/2 time: 858 ms
S' Lateral: 4 cm

## 2023-01-21 ENCOUNTER — Ambulatory Visit: Payer: Commercial Managed Care - PPO | Attending: Physician Assistant | Admitting: Physician Assistant

## 2023-01-21 ENCOUNTER — Encounter: Payer: Self-pay | Admitting: Physician Assistant

## 2023-01-21 VITALS — BP 102/80 | HR 77 | Ht 65.0 in | Wt 262.4 lb

## 2023-01-21 DIAGNOSIS — I952 Hypotension due to drugs: Secondary | ICD-10-CM | POA: Diagnosis not present

## 2023-01-21 DIAGNOSIS — I7121 Aneurysm of the ascending aorta, without rupture: Secondary | ICD-10-CM

## 2023-01-21 DIAGNOSIS — I2699 Other pulmonary embolism without acute cor pulmonale: Secondary | ICD-10-CM | POA: Diagnosis not present

## 2023-01-21 DIAGNOSIS — I351 Nonrheumatic aortic (valve) insufficiency: Secondary | ICD-10-CM | POA: Diagnosis not present

## 2023-01-21 MED ORDER — CLONIDINE HCL 0.3 MG PO TABS: 0.1500 mg | ORAL_TABLET | Freq: Two times a day (BID) | ORAL | Status: AC

## 2023-01-21 NOTE — Progress Notes (Signed)
Cardiology Office Note:    Date:  01/21/2023  ID:  Erika Woods, DOB 1965/06/12, MRN [id number] PCP: Dois Davenport, MD  [facility] Providers Cardiologist:  Verne Carrow, MD       Patient Profile:      Aortic insufficiency TTE 11/12/2018: EF 55-60, Gr 1 DD, normal RVSF, trivial AI, asc aorta 42 mm  TTE 01/14/2023: EF 55-60, no RWMA, G1 DD, GLS -19.8, trivial MR, bicuspid AV, mild AI, AV sclerosis, mean AV 12.4, DI 0.49, V-max 249 cm/s, mild dilation of ascending aorta (43 mm), normal RVSF, RVSP 28.8, RAP 8 Diabetes mellitus  OSA on CPAP  Hx of pulmonary embolism 5.2019; 7.2020; 6.2022 Thoracic aortic aneurysm  CT 04/2019: 4 cm  CT 07/2018: 4.2 cm  CT 04/2019: 4 cm  CT 08/11/22: 4.3 cm; RLL pul artery chronic TE disease          History of Present Illness:  Discussed the use of AI scribe software for clinical note transcription with the patient, who gave verbal consent to proceed.    57 year old female who returns for follow-up on aortic insufficiency, thoracic aortic aneurysm.  The patient's thoracic aorta was stable at 4.3 cm on chest CTA in April of this year. The patient's recent echocardiogram last week showed a normal ejection fraction and mild aortic valve regurgitation. The patient has a history of recurrent pulmonary emboli, with two episodes occurring in June and October of 2022.  While she was waiting in the room, she became significantly dizzy/near syncopal.  EKG demonstrated sinus rhythm.  Blood pressure is on the low side.  She has been on Trulicity for diabetes and has lost 40+ pounds.  She has not eaten since 6 AM.  We gave her some snacks and ginger ale.  She feels much better now.  She has not had chest pain, shortness of breath or syncope.     ROS:  See the HPI.  No fevers, cough, melena, hematochezia, hematuria, vomiting, diarrhea.     Studies Reviewed:   EKG Interpretation Date/Time:  Wednesday January 21 2023 16:21:16 EDT Ventricular Rate:   76 PR Interval:  158 QRS Duration:  138 QT Interval:  410 QTC Calculation: 461 R Axis:   -55  Text Interpretation: Normal sinus rhythm Right bundle branch block Left anterior fascicular block No significant change since last tracing Confirmed by Tereso Newcomer [phone]) on 01/21/2023 4:29:58 PM    Risk Assessment/Calculations:             Physical Exam:   VS:  BP 102/80   Pulse 77   Ht 5\' 5"  (1.651 m)   Wt 262 lb 6.4 oz (119 kg)   LMP 01/29/2013   SpO2 97%   BMI 43.67 kg/m    Wt Readings from Last 3 Encounters:  01/21/23 262 lb 6.4 oz (119 kg)  12/23/22 274 lb (124.3 kg)  10/15/22 280 lb (127 kg)    Constitutional:      Appearance: Healthy appearance. Not in distress.  Pulmonary:     Breath sounds: Normal breath sounds. No wheezing. No rales.  Cardiovascular:     Normal rate. Regular rhythm.     Murmurs: There is no murmur.  Edema:    Peripheral edema absent.  Musculoskeletal:     Cervical back: Neck supple. Skin:    General: Skin is warm and dry.        Assessment and Plan:     Thoracic Aortic Aneurysm Stable at 4.3 cm on chest  CTA April 2024.  -Continue current management and annual surveillance.  Aortic Valve Regurgitation Mild AI with bicuspid aortic valve on echocardiogram 01/14/2023. -Plan follow-up echocardiogram 2 years  History of recurrent pulmonary emboli Echocardiogram 01/14/2023 with normal RV systolic function and normal PASP.  She remains on long-term anticoagulation with Xarelto 20 mg daily. -Continue follow-up with pulmonology  Hypotension She had an episode of lightheadedness/near syncope in the exam room.  This was likely related to hypoglycemia as she has not eaten since early this morning.  She is lost 40+ pounds on GLP-1 agonist therapy for diabetes.  She has no appetite.  She also takes clonidine 0.3 mg nightly for insomnia. -Encouraged regular meals -Decrease clonidine to 0.15 mg daily -Discuss further management clonidine with prescribing  provider        Dispo:  Return in about 1 year (around 01/21/2024) for Routine Follow Up w/ Dr. Clifton James.  Signed, Tereso Newcomer, PA-C

## 2023-01-21 NOTE — Patient Instructions (Signed)
Medication Instructions:  Your physician has recommended you make the following change in your medication:   REDUCE the Clonidine to 1/2 tablet and call the prescribing Dr for further instructions  *If you need a refill on your cardiac medications before your next appointment, please call your pharmacy*   Lab Work: None ordere  If you have labs (blood work) drawn today and your tests are completely normal, you will receive your results only by: MyChart Message (if you have MyChart) OR A paper copy in the mail If you have any lab test that is abnormal or we need to change your treatment, we will call you to review the results.   Testing/Procedures: None ordered   Follow-Up: At [facility], you and your health needs are our priority.  As part of our continuing mission to provide you with exceptional heart care, we have created designated Provider Care Teams.  These Care Teams include your primary Cardiologist (physician) and Advanced Practice Providers (APPs -  Physician Assistants and Nurse Practitioners) who all work together to provide you with the care you need, when you need it.  We recommend signing up for the patient portal called "MyChart".  Sign up information is provided on this After Visit Summary.  MyChart is used to connect with patients for Virtual Visits (Telemedicine).  Patients are able to view lab/test results, encounter notes, upcoming appointments, etc.  Non-urgent messages can be sent to your provider as well.   To learn more about what you can do with MyChart, go to [email or web address].    Your next appointment:   12 month(s)  Provider:   Verne Carrow, MD     Other Instructions

## 2023-01-30 ENCOUNTER — Encounter: Payer: Self-pay | Admitting: Pulmonary Disease

## 2023-01-30 ENCOUNTER — Ambulatory Visit (INDEPENDENT_AMBULATORY_CARE_PROVIDER_SITE_OTHER): Payer: Commercial Managed Care - PPO | Admitting: Pulmonary Disease

## 2023-01-30 VITALS — BP 100/58 | HR 79 | Ht 65.0 in | Wt 261.0 lb

## 2023-01-30 DIAGNOSIS — G4733 Obstructive sleep apnea (adult) (pediatric): Secondary | ICD-10-CM | POA: Diagnosis not present

## 2023-01-30 NOTE — Patient Instructions (Signed)
Nice to see you again  No changes to medication  We will send new order in for CPAP  Return to clinic in 1 year or sooner as needed with Dr. Judeth Horn

## 2023-01-30 NOTE — Progress Notes (Signed)
REPORT   Patient Name:   Erika Woods Date of Exam: 01/14/2023 Medical Rec #:  [id number]      Height:       65.0 in Accession #:    [id number]     Weight:       274.0 lb Date of Birth:  10-09-1965      BSA:          2.261 m Patient Age:    57 years       BP:           128/64 mmHg Patient Gender: F              HR:           69 bpm. Exam Location:  [address] Procedure: 2D Echo, Cardiac Doppler, Color Doppler, 3D Echo and Strain Analysis Indications:    I35.1 Nonrheumatic aortic (valve) insufficiency  History:        Patient has prior history of Echocardiogram examinations, most                 recent 04/30/2021. Signs/Symptoms:Dyspnea, Chest Pain and                 Murmur; Risk Factors:Diabetes, Dyslipidemia and Sleep Apnea.                  Thoracic aortic aneurysm without rupture. Pulmonary embolism.                 Obesity. Bicuspid aortic valve. Cor pulmonale.  Sonographer:    Cathie Beams RCS Referring Phys: [phone] Zachary George SWINYER IMPRESSIONS  1. Left ventricular ejection fraction, by estimation, is 55 to 60%. Left ventricular ejection fraction by PLAX is 56 %. The left ventricle has normal function. The left ventricle has no regional wall motion abnormalities. Left ventricular diastolic parameters are consistent with Grade I diastolic dysfunction (impaired relaxation). The average left ventricular global longitudinal strain is -19.8 %. The global longitudinal strain is normal.  2. The mitral valve is grossly normal. Trivial mitral valve regurgitation.  3. The aortic valve is bicuspid, (Sievers type 1 R/L). Aortic valve regurgitation is mild. Aortic valve sclerosis/calcification is present, without any evidence of aortic stenosis. Aortic regurgitation PHT measures 858 msec. Aortic valve area, by VTI measures 1.53 cm. Aortic valve mean gradient measures 12.4 mmHg. Aortic valve Vmax measures 2.49 m/s. Peak gradient 24.9 mmHg, DI is 0.49.  4. Aortic dilatation noted. There is mild dilatation of the ascending aorta, measuring 43 mm.  5. Right ventricular systolic function is normal. The right ventricular size is normal. There is normal pulmonary artery systolic pressure. The estimated right ventricular systolic pressure is 28.8 mmHg.  6. The inferior vena cava is normal in size with <50% respiratory variability, suggesting right atrial pressure of 8 mmHg. Comparison(s): Changes from prior study are noted. 04/30/2021: LVEF 60-65%, bicuspid aortic valve. FINDINGS  Left Ventricle: Left ventricular ejection fraction, by estimation, is 55 to 60%. Left ventricular ejection fraction by PLAX is 56 %. The left ventricle has normal function. The left ventricle has no regional wall motion abnormalities. The average left ventricular global longitudinal strain  is -19.8 %. The global longitudinal strain is normal. The left ventricular internal cavity size was normal in size. There is no left ventricular hypertrophy. Left ventricular diastolic parameters are consistent with  Grade I diastolic dysfunction (impaired relaxation). Indeterminate filling pressures. Right Ventricle: The right ventricular size is normal. No increase in right ventricular wall thickness. Right  @Patient  ID: Erika Woods, female    DOB: 1965/07/18, 57 y.o.   MRN: [id number]  Chief Complaint  Patient presents with   pulmonary embolism    Referring provider: Dois Davenport, MD  HPI:   57 y.o. woman who is here for follow up of dyspnea on exertion and hypoxemic respiratory failure with history of recurrent PE, hypoxemia now resolved.  Overall doing well.  Breathing better.  Lost 50 pounds on weight loss medication.  Her CPAP machine is malfunctioning.  HPI at initial visit: Patient with multiple pulmonary embolus.  Most recently fall 2022.  Submassive with evidence of right heart strain.  Repeat echocardiogram 04/2021 reviewed and normalization of RV size and pressure was reported.  This is reassuring.  At recent pulmonary visit, she is noted to desaturate with exertion.  She was ordered POC home oxygen.  This is yet to be delivered despite being ordered a couple weeks ago.  He has been checking at home and he states lowest he gets 91 to 92%.  Notably 90%.  This is with exertion.  She recovers to mid 90s when she rests.  PFTs performed today.  These were reviewed in detail with the patient.  Spirometry suggestive of mild restriction versus air trapping.  TLC within normal limits, no restriction present although just above lower limit of normal with severely decreased ERV suggestive of extraparenchymal restriction related to habitus.  Though not meeting criteria RV/TLC ratio 115% suggestive of mild gas trapping, DLCO within normal limits.  Most recent CT scan 05/2021 reviewed and interpreted as clear lungs with mosaicism scattered throughout, no PE visualized.  PMH: Recurrent pulmonary embolism, Surgical history: Low back surgery, tonsillectomy Family history: Adopted, unknown Social history: Never smoker, [place] / Pulmonary Flowsheets:   ACT:      No data to display          MMRC:     No data to display          Epworth:      No  data to display          Tests:   FENO:  No results found for: "NITRICOXIDE"  PFT:    Latest Ref Rng & Units 07/19/2021    9:01 AM  PFT Results  FVC-Pre L 2.39   FVC-Predicted Pre % 70   FVC-Post L 2.33   FVC-Predicted Post % 68   Pre FEV1/FVC % % 87   Post FEV1/FCV % % 90   FEV1-Pre L 2.07   FEV1-Predicted Pre % 77   FEV1-Post L 2.10   DLCO uncorrected ml/min/mmHg 19.57   DLCO UNC% % 95   DLCO corrected ml/min/mmHg 19.22   DLCO COR %Predicted % 93   DLVA Predicted % 122   TLC L 4.15   TLC % Predicted % 82   RV % Predicted % 94   Personally reviewed and interpreted as primary suggestive of mild restriction versus air trapping, no bronchodilator response.  TLC within normal limits, ERV severely low, RV/TLC ratio borderline but not diagnostic for air trapping 115%, DLCO within normal limits, altogether normal PFTs with subtle suggestions of extraparenchymal restriction from habitus as well as air trapping  WALK:     07/04/2021    4:00 PM  SIX MIN WALK  Supplimental Oxygen during Test? (L/min) Yes  O2 Flow Rate 2 L/min  Type Continuous    Imaging: Personally reviewed ECHOCARDIOGRAM COMPLETE  Result Date: 01/14/2023    ECHOCARDIOGRAM  @Patient  ID: Erika Woods, female    DOB: 1965/07/18, 57 y.o.   MRN: [id number]  Chief Complaint  Patient presents with   pulmonary embolism    Referring provider: Dois Davenport, MD  HPI:   57 y.o. woman who is here for follow up of dyspnea on exertion and hypoxemic respiratory failure with history of recurrent PE, hypoxemia now resolved.  Overall doing well.  Breathing better.  Lost 50 pounds on weight loss medication.  Her CPAP machine is malfunctioning.  HPI at initial visit: Patient with multiple pulmonary embolus.  Most recently fall 2022.  Submassive with evidence of right heart strain.  Repeat echocardiogram 04/2021 reviewed and normalization of RV size and pressure was reported.  This is reassuring.  At recent pulmonary visit, she is noted to desaturate with exertion.  She was ordered POC home oxygen.  This is yet to be delivered despite being ordered a couple weeks ago.  He has been checking at home and he states lowest he gets 91 to 92%.  Notably 90%.  This is with exertion.  She recovers to mid 90s when she rests.  PFTs performed today.  These were reviewed in detail with the patient.  Spirometry suggestive of mild restriction versus air trapping.  TLC within normal limits, no restriction present although just above lower limit of normal with severely decreased ERV suggestive of extraparenchymal restriction related to habitus.  Though not meeting criteria RV/TLC ratio 115% suggestive of mild gas trapping, DLCO within normal limits.  Most recent CT scan 05/2021 reviewed and interpreted as clear lungs with mosaicism scattered throughout, no PE visualized.  PMH: Recurrent pulmonary embolism, Surgical history: Low back surgery, tonsillectomy Family history: Adopted, unknown Social history: Never smoker, [place] / Pulmonary Flowsheets:   ACT:      No data to display          MMRC:     No data to display          Epworth:      No  data to display          Tests:   FENO:  No results found for: "NITRICOXIDE"  PFT:    Latest Ref Rng & Units 07/19/2021    9:01 AM  PFT Results  FVC-Pre L 2.39   FVC-Predicted Pre % 70   FVC-Post L 2.33   FVC-Predicted Post % 68   Pre FEV1/FVC % % 87   Post FEV1/FCV % % 90   FEV1-Pre L 2.07   FEV1-Predicted Pre % 77   FEV1-Post L 2.10   DLCO uncorrected ml/min/mmHg 19.57   DLCO UNC% % 95   DLCO corrected ml/min/mmHg 19.22   DLCO COR %Predicted % 93   DLVA Predicted % 122   TLC L 4.15   TLC % Predicted % 82   RV % Predicted % 94   Personally reviewed and interpreted as primary suggestive of mild restriction versus air trapping, no bronchodilator response.  TLC within normal limits, ERV severely low, RV/TLC ratio borderline but not diagnostic for air trapping 115%, DLCO within normal limits, altogether normal PFTs with subtle suggestions of extraparenchymal restriction from habitus as well as air trapping  WALK:     07/04/2021    4:00 PM  SIX MIN WALK  Supplimental Oxygen during Test? (L/min) Yes  O2 Flow Rate 2 L/min  Type Continuous    Imaging: Personally reviewed ECHOCARDIOGRAM COMPLETE  Result Date: 01/14/2023    ECHOCARDIOGRAM  REPORT   Patient Name:   Erika Woods Date of Exam: 01/14/2023 Medical Rec #:  [id number]      Height:       65.0 in Accession #:    [id number]     Weight:       274.0 lb Date of Birth:  10-09-1965      BSA:          2.261 m Patient Age:    57 years       BP:           128/64 mmHg Patient Gender: F              HR:           69 bpm. Exam Location:  [address] Procedure: 2D Echo, Cardiac Doppler, Color Doppler, 3D Echo and Strain Analysis Indications:    I35.1 Nonrheumatic aortic (valve) insufficiency  History:        Patient has prior history of Echocardiogram examinations, most                 recent 04/30/2021. Signs/Symptoms:Dyspnea, Chest Pain and                 Murmur; Risk Factors:Diabetes, Dyslipidemia and Sleep Apnea.                  Thoracic aortic aneurysm without rupture. Pulmonary embolism.                 Obesity. Bicuspid aortic valve. Cor pulmonale.  Sonographer:    Cathie Beams RCS Referring Phys: [phone] Zachary George SWINYER IMPRESSIONS  1. Left ventricular ejection fraction, by estimation, is 55 to 60%. Left ventricular ejection fraction by PLAX is 56 %. The left ventricle has normal function. The left ventricle has no regional wall motion abnormalities. Left ventricular diastolic parameters are consistent with Grade I diastolic dysfunction (impaired relaxation). The average left ventricular global longitudinal strain is -19.8 %. The global longitudinal strain is normal.  2. The mitral valve is grossly normal. Trivial mitral valve regurgitation.  3. The aortic valve is bicuspid, (Sievers type 1 R/L). Aortic valve regurgitation is mild. Aortic valve sclerosis/calcification is present, without any evidence of aortic stenosis. Aortic regurgitation PHT measures 858 msec. Aortic valve area, by VTI measures 1.53 cm. Aortic valve mean gradient measures 12.4 mmHg. Aortic valve Vmax measures 2.49 m/s. Peak gradient 24.9 mmHg, DI is 0.49.  4. Aortic dilatation noted. There is mild dilatation of the ascending aorta, measuring 43 mm.  5. Right ventricular systolic function is normal. The right ventricular size is normal. There is normal pulmonary artery systolic pressure. The estimated right ventricular systolic pressure is 28.8 mmHg.  6. The inferior vena cava is normal in size with <50% respiratory variability, suggesting right atrial pressure of 8 mmHg. Comparison(s): Changes from prior study are noted. 04/30/2021: LVEF 60-65%, bicuspid aortic valve. FINDINGS  Left Ventricle: Left ventricular ejection fraction, by estimation, is 55 to 60%. Left ventricular ejection fraction by PLAX is 56 %. The left ventricle has normal function. The left ventricle has no regional wall motion abnormalities. The average left ventricular global longitudinal strain  is -19.8 %. The global longitudinal strain is normal. The left ventricular internal cavity size was normal in size. There is no left ventricular hypertrophy. Left ventricular diastolic parameters are consistent with  Grade I diastolic dysfunction (impaired relaxation). Indeterminate filling pressures. Right Ventricle: The right ventricular size is normal. No increase in right ventricular wall thickness. Right  @Patient  ID: Erika Woods, female    DOB: 1965/07/18, 57 y.o.   MRN: [id number]  Chief Complaint  Patient presents with   pulmonary embolism    Referring provider: Dois Davenport, MD  HPI:   57 y.o. woman who is here for follow up of dyspnea on exertion and hypoxemic respiratory failure with history of recurrent PE, hypoxemia now resolved.  Overall doing well.  Breathing better.  Lost 50 pounds on weight loss medication.  Her CPAP machine is malfunctioning.  HPI at initial visit: Patient with multiple pulmonary embolus.  Most recently fall 2022.  Submassive with evidence of right heart strain.  Repeat echocardiogram 04/2021 reviewed and normalization of RV size and pressure was reported.  This is reassuring.  At recent pulmonary visit, she is noted to desaturate with exertion.  She was ordered POC home oxygen.  This is yet to be delivered despite being ordered a couple weeks ago.  He has been checking at home and he states lowest he gets 91 to 92%.  Notably 90%.  This is with exertion.  She recovers to mid 90s when she rests.  PFTs performed today.  These were reviewed in detail with the patient.  Spirometry suggestive of mild restriction versus air trapping.  TLC within normal limits, no restriction present although just above lower limit of normal with severely decreased ERV suggestive of extraparenchymal restriction related to habitus.  Though not meeting criteria RV/TLC ratio 115% suggestive of mild gas trapping, DLCO within normal limits.  Most recent CT scan 05/2021 reviewed and interpreted as clear lungs with mosaicism scattered throughout, no PE visualized.  PMH: Recurrent pulmonary embolism, Surgical history: Low back surgery, tonsillectomy Family history: Adopted, unknown Social history: Never smoker, [place] / Pulmonary Flowsheets:   ACT:      No data to display          MMRC:     No data to display          Epworth:      No  data to display          Tests:   FENO:  No results found for: "NITRICOXIDE"  PFT:    Latest Ref Rng & Units 07/19/2021    9:01 AM  PFT Results  FVC-Pre L 2.39   FVC-Predicted Pre % 70   FVC-Post L 2.33   FVC-Predicted Post % 68   Pre FEV1/FVC % % 87   Post FEV1/FCV % % 90   FEV1-Pre L 2.07   FEV1-Predicted Pre % 77   FEV1-Post L 2.10   DLCO uncorrected ml/min/mmHg 19.57   DLCO UNC% % 95   DLCO corrected ml/min/mmHg 19.22   DLCO COR %Predicted % 93   DLVA Predicted % 122   TLC L 4.15   TLC % Predicted % 82   RV % Predicted % 94   Personally reviewed and interpreted as primary suggestive of mild restriction versus air trapping, no bronchodilator response.  TLC within normal limits, ERV severely low, RV/TLC ratio borderline but not diagnostic for air trapping 115%, DLCO within normal limits, altogether normal PFTs with subtle suggestions of extraparenchymal restriction from habitus as well as air trapping  WALK:     07/04/2021    4:00 PM  SIX MIN WALK  Supplimental Oxygen during Test? (L/min) Yes  O2 Flow Rate 2 L/min  Type Continuous    Imaging: Personally reviewed ECHOCARDIOGRAM COMPLETE  Result Date: 01/14/2023    ECHOCARDIOGRAM  @Patient  ID: Erika Woods, female    DOB: 1965/07/18, 57 y.o.   MRN: [id number]  Chief Complaint  Patient presents with   pulmonary embolism    Referring provider: Dois Davenport, MD  HPI:   57 y.o. woman who is here for follow up of dyspnea on exertion and hypoxemic respiratory failure with history of recurrent PE, hypoxemia now resolved.  Overall doing well.  Breathing better.  Lost 50 pounds on weight loss medication.  Her CPAP machine is malfunctioning.  HPI at initial visit: Patient with multiple pulmonary embolus.  Most recently fall 2022.  Submassive with evidence of right heart strain.  Repeat echocardiogram 04/2021 reviewed and normalization of RV size and pressure was reported.  This is reassuring.  At recent pulmonary visit, she is noted to desaturate with exertion.  She was ordered POC home oxygen.  This is yet to be delivered despite being ordered a couple weeks ago.  He has been checking at home and he states lowest he gets 91 to 92%.  Notably 90%.  This is with exertion.  She recovers to mid 90s when she rests.  PFTs performed today.  These were reviewed in detail with the patient.  Spirometry suggestive of mild restriction versus air trapping.  TLC within normal limits, no restriction present although just above lower limit of normal with severely decreased ERV suggestive of extraparenchymal restriction related to habitus.  Though not meeting criteria RV/TLC ratio 115% suggestive of mild gas trapping, DLCO within normal limits.  Most recent CT scan 05/2021 reviewed and interpreted as clear lungs with mosaicism scattered throughout, no PE visualized.  PMH: Recurrent pulmonary embolism, Surgical history: Low back surgery, tonsillectomy Family history: Adopted, unknown Social history: Never smoker, lives [place] / Pulmonary Flowsheets:   ACT:      No data to display          MMRC:     No data to display          Epworth:      No  data to display          Tests:   FENO:  No results found for: "NITRICOXIDE"  PFT:    Latest Ref Rng & Units 07/19/2021    9:01 AM  PFT Results  FVC-Pre L 2.39   FVC-Predicted Pre % 70   FVC-Post L 2.33   FVC-Predicted Post % 68   Pre FEV1/FVC % % 87   Post FEV1/FCV % % 90   FEV1-Pre L 2.07   FEV1-Predicted Pre % 77   FEV1-Post L 2.10   DLCO uncorrected ml/min/mmHg 19.57   DLCO UNC% % 95   DLCO corrected ml/min/mmHg 19.22   DLCO COR %Predicted % 93   DLVA Predicted % 122   TLC L 4.15   TLC % Predicted % 82   RV % Predicted % 94   Personally reviewed and interpreted as primary suggestive of mild restriction versus air trapping, no bronchodilator response.  TLC within normal limits, ERV severely low, RV/TLC ratio borderline but not diagnostic for air trapping 115%, DLCO within normal limits, altogether normal PFTs with subtle suggestions of extraparenchymal restriction from habitus as well as air trapping  WALK:     07/04/2021    4:00 PM  SIX MIN WALK  Supplimental Oxygen during Test? (L/min) Yes  O2 Flow Rate 2 L/min  Type Continuous    Imaging: Personally reviewed ECHOCARDIOGRAM COMPLETE  Result Date: 01/14/2023    ECHOCARDIOGRAM  @Patient  ID: Erika Woods, female    DOB: 1965/07/18, 57 y.o.   MRN: [id number]  Chief Complaint  Patient presents with   pulmonary embolism    Referring provider: Dois Davenport, MD  HPI:   57 y.o. woman who is here for follow up of dyspnea on exertion and hypoxemic respiratory failure with history of recurrent PE, hypoxemia now resolved.  Overall doing well.  Breathing better.  Lost 50 pounds on weight loss medication.  Her CPAP machine is malfunctioning.  HPI at initial visit: Patient with multiple pulmonary embolus.  Most recently fall 2022.  Submassive with evidence of right heart strain.  Repeat echocardiogram 04/2021 reviewed and normalization of RV size and pressure was reported.  This is reassuring.  At recent pulmonary visit, she is noted to desaturate with exertion.  She was ordered POC home oxygen.  This is yet to be delivered despite being ordered a couple weeks ago.  He has been checking at home and he states lowest he gets 91 to 92%.  Notably 90%.  This is with exertion.  She recovers to mid 90s when she rests.  PFTs performed today.  These were reviewed in detail with the patient.  Spirometry suggestive of mild restriction versus air trapping.  TLC within normal limits, no restriction present although just above lower limit of normal with severely decreased ERV suggestive of extraparenchymal restriction related to habitus.  Though not meeting criteria RV/TLC ratio 115% suggestive of mild gas trapping, DLCO within normal limits.  Most recent CT scan 05/2021 reviewed and interpreted as clear lungs with mosaicism scattered throughout, no PE visualized.  PMH: Recurrent pulmonary embolism, Surgical history: Low back surgery, tonsillectomy Family history: Adopted, unknown Social history: Never smoker, lives [place] / Pulmonary Flowsheets:   ACT:      No data to display          MMRC:     No data to display          Epworth:      No  data to display          Tests:   FENO:  No results found for: "NITRICOXIDE"  PFT:    Latest Ref Rng & Units 07/19/2021    9:01 AM  PFT Results  FVC-Pre L 2.39   FVC-Predicted Pre % 70   FVC-Post L 2.33   FVC-Predicted Post % 68   Pre FEV1/FVC % % 87   Post FEV1/FCV % % 90   FEV1-Pre L 2.07   FEV1-Predicted Pre % 77   FEV1-Post L 2.10   DLCO uncorrected ml/min/mmHg 19.57   DLCO UNC% % 95   DLCO corrected ml/min/mmHg 19.22   DLCO COR %Predicted % 93   DLVA Predicted % 122   TLC L 4.15   TLC % Predicted % 82   RV % Predicted % 94   Personally reviewed and interpreted as primary suggestive of mild restriction versus air trapping, no bronchodilator response.  TLC within normal limits, ERV severely low, RV/TLC ratio borderline but not diagnostic for air trapping 115%, DLCO within normal limits, altogether normal PFTs with subtle suggestions of extraparenchymal restriction from habitus as well as air trapping  WALK:     07/04/2021    4:00 PM  SIX MIN WALK  Supplimental Oxygen during Test? (L/min) Yes  O2 Flow Rate 2 L/min  Type Continuous    Imaging: Personally reviewed ECHOCARDIOGRAM COMPLETE  Result Date: 01/14/2023    ECHOCARDIOGRAM

## 2023-03-20 ENCOUNTER — Other Ambulatory Visit: Payer: Self-pay | Admitting: Gastroenterology

## 2023-03-20 DIAGNOSIS — K7460 Unspecified cirrhosis of liver: Secondary | ICD-10-CM

## 2023-05-08 ENCOUNTER — Ambulatory Visit
Admission: RE | Admit: 2023-05-08 | Discharge: 2023-05-08 | Disposition: A | Discharge status: Home / Self Care | Payer: Commercial Managed Care - PPO | Source: Ambulatory Visit | Attending: Gastroenterology | Admitting: Gastroenterology

## 2023-05-08 DIAGNOSIS — K7460 Unspecified cirrhosis of liver: Secondary | ICD-10-CM

## 2023-06-03 ENCOUNTER — Encounter: Payer: Self-pay | Admitting: Pulmonary Disease

## 2023-06-03 ENCOUNTER — Ambulatory Visit: Payer: Commercial Managed Care - PPO | Admitting: Pulmonary Disease

## 2023-06-03 DIAGNOSIS — Z86711 Personal history of pulmonary embolism: Secondary | ICD-10-CM

## 2023-06-03 LAB — D-DIMER, QUANTITATIVE: D-Dimer, Quant: 0.27 ug{FEU}/mL (ref <0.50)

## 2023-06-03 MED ORDER — AZITHROMYCIN 250 MG PO TABS: ORAL_TABLET | ORAL | 0 refills | Status: AC

## 2023-06-03 MED ORDER — PREDNISONE 10 MG PO TABS: ORAL_TABLET | ORAL | 0 refills | Status: AC

## 2023-06-03 NOTE — Patient Instructions (Signed)
Nice to see you again  Lab work - D-Dimer today. If negative then I think that is very reassuring no need to worry about blood clot. If elevated we will get a CT scan with contrast to look for blood clot, based on vitals and being on xarelto this is less likely but worth exploring if needed.  Prolonged prednisone taper 40 mg for 7 days, 20 mg for 7 days, 10 mg for 7 days then stop.  Z-Pak, azithromycin mainly for anti-inflammatory properties.  If things not improving in the next 2 to 3 weeks and the D-dimer is negative, it is probably worth exploring a CT chest without contrast to make sure it is not anything else going on.  Return to clinic in 2 months or sooner as needed with Dr. Judeth Horn

## 2023-06-03 NOTE — Progress Notes (Signed)
@Patient  ID: Erika Woods, female    DOB: Dec 14, 1965, 58 y.o.   MRN: [id number]  Chief Complaint  Patient presents with  . Follow-up    Pt states she has been having a cough for about a month. Denies any other symptoms. Had labs done yesterday by pcp. Using breztri inhaler     Referring provider: Dois Davenport, MD  HPI:   58 y.o. woman who is here for follow up of dyspnea on exertion and hypoxemic respiratory failure with history of recurrent PE, hypoxemia now resolved here for acute visit for subacute cough.  Patient had been doing well.  No issues.  Maintained on Breztri.  Dyspnea, cough okay.  By the month ago developed a cough.  No sore throat.  No fever.  No sinus congestion or pain.  No runny nose etc.  No new environmental factors, no remodeling no flooding no new home environment no new people in her home no new pets etc.  Chest x-ray 1/2 result reviewed reported as clear.  Prescribed a course of prednisone and antibiotic.  Got better for a few days and came back.  Second round of prednisone prescribed recently.  Got better for a few days came back.  Extensive lab workup 1/21 reviewed with patient on her cell phone from her PCP office.  HPI at initial visit: Patient with multiple pulmonary embolus.  Most recently fall 2022.  Submassive with evidence of right heart strain.  Repeat echocardiogram 04/2021 reviewed and normalization of RV size and pressure was reported.  This is reassuring.  At recent pulmonary visit, she is noted to desaturate with exertion.  She was ordered POC home oxygen.  This is yet to be delivered despite being ordered a couple weeks ago.  He has been checking at home and he states lowest he gets 91 to 92%.  Notably 59%.  This is with exertion.  She recovers to mid 90s when she rests.  PFTs performed today.  These were reviewed in detail with the patient.  Spirometry suggestive of mild restriction versus air trapping.  TLC within normal limits, no restriction  present although just above lower limit of normal with severely decreased ERV suggestive of extraparenchymal restriction related to habitus.  Though not meeting criteria RV/TLC ratio 115% suggestive of mild gas trapping, DLCO within normal limits.  Most recent CT scan 05/2021 reviewed and interpreted as clear lungs with mosaicism scattered throughout, no PE visualized.  PMH: Recurrent pulmonary embolism, Surgical history: Low back surgery, tonsillectomy Family history: Adopted, unknown Social history: Never smoker, [place] / Pulmonary Flowsheets:   ACT:      No data to display          MMRC:     No data to display          Epworth:      No data to display          Tests:   FENO:  No results found for: "NITRICOXIDE"  PFT:    Latest Ref Rng & Units 07/19/2021    9:01 AM  PFT Results  FVC-Pre L 2.39   FVC-Predicted Pre % 70   FVC-Post L 2.33   FVC-Predicted Post % 68   Pre FEV1/FVC % % 87   Post FEV1/FCV % % 90   FEV1-Pre L 2.07   FEV1-Predicted Pre % 77   FEV1-Post L 2.10   DLCO uncorrected ml/min/mmHg 19.57   DLCO UNC% % 95   DLCO  corrected ml/min/mmHg 19.22   DLCO COR %Predicted % 93   DLVA Predicted % 122   TLC L 4.15   TLC % Predicted % 82   RV % Predicted % 94   Personally reviewed and interpreted as primary suggestive of mild restriction versus air trapping, no bronchodilator response.  TLC within normal limits, ERV severely low, RV/TLC ratio borderline but not diagnostic for air trapping 115%, DLCO within normal limits, altogether normal PFTs with subtle suggestions of extraparenchymal restriction from habitus as well as air trapping  WALK:     07/04/2021    4:00 PM  SIX MIN WALK  Supplimental Oxygen during Test? (L/min) Yes  O2 Flow Rate 2 L/min  Type Continuous    Imaging: Personally reviewed US Abdomen Limited RUQ (LIVER/GB) Result Date: 05/08/2023 CLINICAL DATA:  Cirrhosis EXAM: ULTRASOUND ABDOMEN LIMITED  RIGHT UPPER QUADRANT COMPARISON:  Ultrasound abdomen 10/09/2022 FINDINGS: Gallbladder: Small stones versus polyps in the gallbladder lumen. Largest on exam today measures 3 mm. No gallbladder wall thickening or pericholecystic fluid. Negative sonographic Murphy's sign. Common bile duct: Diameter: 2.9 mm Liver: Increased coarsened echogenicity. No focal lesion. Portal vein is patent on color Doppler imaging with normal direction of blood flow towards the liver. Other: None. IMPRESSION: 1. Increased coarsened hepatic parenchymal echogenicity suggestive of steatosis. 2. Small stones versus polyps in the gallbladder lumen. No secondary signs to suggest acute cholecystitis. Electronically Signed   By: Annia Belt M.D.   On: 05/08/2023 10:08    Lab Results: Personally reviewed CBC    Component Value Date/Time   WBC 7.3 08/11/2022 1720   RBC 4.43 08/11/2022 1720   HGB 12.1 08/11/2022 1720   HGB 11.7 (L) 12/19/2021 1355   HGB 12.5 04/18/2020 1326   HCT 39.5 08/11/2022 1720   HCT 37.7 04/18/2020 1326   PLT 243 08/11/2022 1720   PLT 299 12/19/2021 1355   PLT 265 04/18/2020 1326   MCV 89.2 08/11/2022 1720   MCV 85 04/18/2020 1326   MCH 27.3 08/11/2022 1720   MCHC 30.6 08/11/2022 1720   RDW 15.1 08/11/2022 1720   RDW 13.4 04/18/2020 1326   LYMPHSABS 1.6 05/06/2022 1005   MONOABS 0.4 05/06/2022 1005   EOSABS 0.3 05/06/2022 1005   BASOSABS 0.1 05/06/2022 1005    BMET    Component Value Date/Time   NA 137 08/11/2022 1720   NA 141 04/18/2020 1326   K 4.1 08/11/2022 1720   CL 102 08/11/2022 1720   CO2 26 08/11/2022 1720   GLUCOSE 133 (H) 08/11/2022 1720   BUN 16 08/11/2022 1720   BUN 15 04/18/2020 1326   CREATININE 1.12 (H) 08/11/2022 1720   CREATININE 0.90 12/19/2021 1355   CREATININE 0.98 09/25/2015 0837   CALCIUM 8.7 (L) 08/11/2022 1720   GFRNONAA 57 (L) 08/11/2022 1720   GFRNONAA >60 12/19/2021 1355   GFRNONAA 67 09/25/2015 0837   GFRAA 66 04/18/2020 1326   GFRAA 77 09/25/2015  0837    BNP    Component Value Date/Time   BNP 100.6 (H) 05/06/2022 1005    ProBNP No results found for: "PROBNP"  Specialty Problems       Pulmonary Problems   Sleep apnea    Allergies  Allergen Reactions  . Sulfa Antibiotics Hives  . Cephalexin Itching    Other Reaction(s): Not available    Immunization History  Administered Date(s) Administered  . Hep A / Hep B 06/28/2018, 07/28/2018, 12/21/2018  . Influenza,inj,Quad PF,6+ Mos 03/14/2019, 03/21/2019, 02/19/2021  . Influenza-Unspecified  01/12/2018  . PFIZER(Purple Top)SARS-COV-2 Vaccination 09/20/2019, 10/13/2019  . Pneumococcal Polysaccharide-23 02/26/2010    Past Medical History:  Diagnosis Date  . Acute pulmonary embolism ([name]) 11/11/2018  . Acute pulmonary embolism with acute cor pulmonale ([name]) 09/16/2017  . Acute superficial venous thrombosis of left lower extremity   . Aortic insufficiency    Echocardiogram 08/2019: prob bicuspid AoV, mild to mod AI, mild AS (mean 12 mmHg), EF 55-60, no RWMA, Gr 1 DD, GLS -22.2%, normal RVSF, Ao Root 39 mm  . Coronary CTA    Coronary CTA 08/2019: Calcium score 0, no evidence of CAD, ascending aorta 4 cm  . Diabetes mellitus without complication ([name])   . DM (diabetes mellitus) ([name]) 08/18/2011  . GERD (gastroesophageal reflux disease)   . Heart murmur   . History of kidney stones   . Impaired fasting glucose   . Mood disorder ([name]) 08/18/2011  . Obesity, Class III, BMI 40-49.9 (morbid obesity) ([name]) 09/16/2017  . Obstructive sleep apnea on CPAP   . Osteoarthritis of knee   . Personal history of pulmonary embolism   . PTSD (post-traumatic stress disorder) 08/18/2011  . Pulmonary emboli ([name]) 09/15/2017  . Renal disorder    kideny stones  . Restless leg syndrome   . Severe recurrent major depression without psychotic features ([name]) 02/17/2018  . Sleep apnea 08/18/2011  . Thoracic aortic aneurysm ([name])   . Thoracic aortic aneurysm without rupture ([name])   . Urinary  tract infection    hx of chronic uti per pt  . UTI (urinary tract infection)    recurrent    Tobacco History: Social History   Tobacco Use  Smoking Status Never  Smokeless Tobacco Never   Counseling given: Not Answered   Continue to not smoke  Outpatient Encounter Medications as of 06/03/2023  Medication Sig  . acetaminophen (TYLENOL) 500 MG tablet Take 1,000-1,500 mg by mouth every 8 (eight) hours as needed for moderate pain.  Marland Kitchen albuterol (VENTOLIN HFA) 108 (90 Base) MCG/ACT inhaler Inhale 2 puffs into the lungs every 6 (six) hours as needed for wheezing or shortness of breath.  Marland Kitchen azithromycin (ZITHROMAX) 250 MG tablet Take 2 tablets (500 mg total) by mouth daily for 1 day, THEN 1 tablet (250 mg total) daily for 4 days.  . Budeson-Glycopyrrol-Formoterol (BREZTRI AEROSPHERE) 160-9-4.8 MCG/ACT AERO Inhale 1 puff into the lungs daily as needed (Asthma).  . cloNIDine (CATAPRES) 0.3 MG tablet Take 0.5 tablets (0.15 mg total) by mouth 2 (two) times daily.  . clotrimazole-betamethasone (LOTRISONE) cream Apply 1 Application topically 2 (two) times daily as needed (rash).  . Dextromethorphan-buPROPion ER (AUVELITY) 45-105 MG TBCR Take 1 tablet by mouth 2 (two) times daily.  . fluticasone (FLONASE) 50 MCG/ACT nasal spray Place 1 spray into both nostrils 2 (two) times daily as needed for allergies.  Marland Kitchen gabapentin (NEURONTIN) 300 MG capsule Take 300-600 mg by mouth See admin instructions. Taking 2 capsules (600 mg) in the am and 300 mg at bedtime  . ipratropium (ATROVENT) 0.03 % nasal spray Place 2 sprays into both nostrils at bedtime as needed for allergies.  Marland Kitchen lamoTRIgine (LAMICTAL) 150 MG tablet Take 150 mg by mouth daily after breakfast.   . metFORMIN (GLUCOPHAGE) 500 MG tablet Take 500 mg by mouth 2 (two) times daily with a meal.   . ondansetron (ZOFRAN) 8 MG tablet Take 8 mg by mouth every 8 (eight) hours as needed for nausea or vomiting.  Marland Kitchen oxymetazoline (AFRIN) 0.05 % nasal spray Place 1  spray into both nostrils 2 (two) times daily as needed for congestion.  . predniSONE (DELTASONE) 10 MG tablet Take 4 tablets (40 mg total) by mouth daily with breakfast for 7 days, THEN 2 tablets (20 mg total) daily with breakfast for 7 days, THEN 1 tablet (10 mg total) daily with breakfast for 7 days.  . rivaroxaban (XARELTO) 20 MG TABS tablet Take 1 tablet (20 mg total) by mouth daily with supper.  Marland Kitchen rOPINIRole (REQUIP) 4 MG tablet Take 12 mg by mouth at bedtime.  . Vitamin D, Ergocalciferol, (DRISDOL) 1.25 MG (50000 UNIT) CAPS capsule Take 50,000 Units by mouth every Monday.  . [DISCONTINUED] amoxicillin (AMOXIL) 500 MG capsule Take 2,000 mg by mouth See admin instructions. 1 hour before dental procedures  . [DISCONTINUED] nitrofurantoin, macrocrystal-monohydrate, (MACROBID) 100 MG capsule Take 100 mg by mouth 2 (two) times daily.  . [DISCONTINUED] phenazopyridine (PYRIDIUM) 200 MG tablet Take by mouth.  . [DISCONTINUED] TRULICITY 4.5 MG/0.5ML SOPN Inject 4.5 mg as directed once a week.   No facility-administered encounter medications on file as of 06/03/2023.     Review of Systems  Review of Systems  N/A Physical Exam  BP 108/74   Pulse 73   Ht 5\' 5"  (1.651 m)   Wt 237 lb 9.6 oz (107.8 kg)   LMP 01/29/2013   SpO2 99%   BMI 39.54 kg/m   Wt Readings from Last 5 Encounters:  06/03/23 237 lb 9.6 oz (107.8 kg)  01/30/23 261 lb (118.4 kg)  01/21/23 262 lb 6.4 oz (119 kg)  12/23/22 274 lb (124.3 kg)  10/15/22 280 lb (127 kg)    BMI Readings from Last 5 Encounters:  06/03/23 39.54 kg/m  01/30/23 43.43 kg/m  01/21/23 43.67 kg/m  12/23/22 45.60 kg/m  10/15/22 46.59 kg/m     Physical Exam General: Well-appearing, no acute distress Eyes: EOMI, no icterus Neck: Supple, no JVP Pulmonary: Clear, no wheeze or crackles or rhonchi, normal work of breathing Cardiovascular: Warm, no edema Abdomen: Nondistended, bowel sounds present MSK: No synovitis, no effusion Neuro:  Normal gait, no weakness Psych: Normal mood, full affect   Assessment & Plan:    Recurrent PE: Continue lifelong anticoagulation.  TTE without signs of elevated right-sided pressures.  Sign of what may be chronic clot on CTA 09/2021.  Consider additional workup for CTEPH if dyspnea were to worsen or become a problem.  Currently improving with more activity.  D-dimer today given prolonged cough, unlikely to be related to PE but her symptoms or manifestations of PE have been unusual.  Given no preceding viral signs and no environmental triggers that could have caused cough will evaluate for PE.  If D-dimer negative nothing further needed.  If elevated will order CTA PE protocol.  Fortunately she continues on Xarelto which further decreases the risk of PE.  Subacute cough: 1 month.  No preceding viral or infectious symptoms.  No new environmental changes.  Do suspect there is some mild virus causing prolonged bronchitis.  Continue Breztri.  Prolonged prednisone taper, now third course of prednisone in the last month.  In addition, Z-Pak for more anti-inflammatory properties.  Lung exam fortunately is clear.  Return in about 2 months (around 08/01/2023) for f/u Dr. Judeth Horn.   Karren Burly, MD 06/03/2023   I spent 41 minutes in the care of the patient including review of records, face-to-face visit, coordination of care.

## 2023-06-03 NOTE — Progress Notes (Signed)
D-Dimer is normal - great news.

## 2023-06-21 ENCOUNTER — Other Ambulatory Visit: Payer: Self-pay | Admitting: Pulmonary Disease

## 2023-07-29 ENCOUNTER — Ambulatory Visit: Payer: Commercial Managed Care - PPO | Admitting: Pulmonary Disease

## 2023-09-22 ENCOUNTER — Telehealth (HOSPITAL_BASED_OUTPATIENT_CLINIC_OR_DEPARTMENT_OTHER): Payer: Self-pay

## 2023-09-22 NOTE — Telephone Encounter (Signed)
 Please advise  Copied from CRM [id number]. Topic: Clinical - Prescription Issue >> Sep 22, 2023  1:16 PM Erika Woods wrote: Reason for CRM:   Patient is calling to state she is between insurances and her rivaroxaban  (XARELTO ) 20 MG TABS tablet for this month is due to refill. She states that without insurance, it will be over $600. Patient is inquiring if we have any samples or vouchers we may be able to assist her with until her new insurance can kick in the first of June.   Please return call to patient to advise. Patient is very concerned about not being on this, as the last time she had a lapse in taking it, she had a PE. She has enough for this week.

## 2023-09-23 ENCOUNTER — Telehealth: Payer: Self-pay | Admitting: Cardiovascular Disease

## 2023-09-23 NOTE — Telephone Encounter (Signed)
 Patient calling the office for samples of medication:   1.  What medication and dosage are you requesting samples for?  rivaroxaban  (XARELTO ) 20 MG TABS tablet   2.  Are you currently out of this medication?    Patient stated she only had 5-6 tablets left and she is in between insurance and will need enough medication until June 1 when her new insurance will start.

## 2023-09-23 NOTE — Telephone Encounter (Signed)
 Spoke with pt, aware we are not getting any more xarelto  samples and do not have any currently. Patient voiced understanding

## 2023-09-23 NOTE — Telephone Encounter (Signed)
 Spoke to patient and relayed below message/recommendations. She will reach out to cardiology.  She stated that she located the patient assistance forms online and she will completed them if needed.  Nothing further needed.

## 2023-09-23 NOTE — Telephone Encounter (Signed)
 We have no samples. I recommend she contact her PCP, cardiologist, and hematologist to ask about samples. We should offer manufacturing assistance paperwork but I do not think it will be approved by the end of week.

## 2023-10-12 DIAGNOSIS — F4312 Post-traumatic stress disorder, chronic: Secondary | ICD-10-CM | POA: Diagnosis not present

## 2023-10-14 DIAGNOSIS — Z6841 Body Mass Index (BMI) 40.0 and over, adult: Secondary | ICD-10-CM | POA: Diagnosis not present

## 2023-10-14 DIAGNOSIS — D649 Anemia, unspecified: Secondary | ICD-10-CM | POA: Diagnosis not present

## 2023-10-14 DIAGNOSIS — Z1231 Encounter for screening mammogram for malignant neoplasm of breast: Secondary | ICD-10-CM | POA: Diagnosis not present

## 2023-10-14 DIAGNOSIS — Z01419 Encounter for gynecological examination (general) (routine) without abnormal findings: Secondary | ICD-10-CM | POA: Diagnosis not present

## 2023-10-15 DIAGNOSIS — F4312 Post-traumatic stress disorder, chronic: Secondary | ICD-10-CM | POA: Diagnosis not present

## 2023-10-19 DIAGNOSIS — F4312 Post-traumatic stress disorder, chronic: Secondary | ICD-10-CM | POA: Diagnosis not present

## 2023-10-26 DIAGNOSIS — F4312 Post-traumatic stress disorder, chronic: Secondary | ICD-10-CM | POA: Diagnosis not present

## 2023-11-02 DIAGNOSIS — F4312 Post-traumatic stress disorder, chronic: Secondary | ICD-10-CM | POA: Diagnosis not present

## 2023-11-09 DIAGNOSIS — F4312 Post-traumatic stress disorder, chronic: Secondary | ICD-10-CM | POA: Diagnosis not present

## 2023-11-16 DIAGNOSIS — F4312 Post-traumatic stress disorder, chronic: Secondary | ICD-10-CM | POA: Diagnosis not present

## 2023-11-19 DIAGNOSIS — F4312 Post-traumatic stress disorder, chronic: Secondary | ICD-10-CM | POA: Diagnosis not present

## 2023-11-26 DIAGNOSIS — F4312 Post-traumatic stress disorder, chronic: Secondary | ICD-10-CM | POA: Diagnosis not present

## 2023-12-03 DIAGNOSIS — F4312 Post-traumatic stress disorder, chronic: Secondary | ICD-10-CM | POA: Diagnosis not present

## 2023-12-07 DIAGNOSIS — F4312 Post-traumatic stress disorder, chronic: Secondary | ICD-10-CM | POA: Diagnosis not present

## 2023-12-21 DIAGNOSIS — F4312 Post-traumatic stress disorder, chronic: Secondary | ICD-10-CM | POA: Diagnosis not present

## 2023-12-28 DIAGNOSIS — F4312 Post-traumatic stress disorder, chronic: Secondary | ICD-10-CM | POA: Diagnosis not present

## 2023-12-30 DIAGNOSIS — F4312 Post-traumatic stress disorder, chronic: Secondary | ICD-10-CM | POA: Diagnosis not present

## 2024-01-05 DIAGNOSIS — F4312 Post-traumatic stress disorder, chronic: Secondary | ICD-10-CM | POA: Diagnosis not present

## 2024-01-07 DIAGNOSIS — F4312 Post-traumatic stress disorder, chronic: Secondary | ICD-10-CM | POA: Diagnosis not present

## 2024-01-12 DIAGNOSIS — N390 Urinary tract infection, site not specified: Secondary | ICD-10-CM | POA: Diagnosis not present

## 2024-01-18 DIAGNOSIS — F4312 Post-traumatic stress disorder, chronic: Secondary | ICD-10-CM | POA: Diagnosis not present

## 2024-01-25 DIAGNOSIS — F4312 Post-traumatic stress disorder, chronic: Secondary | ICD-10-CM | POA: Diagnosis not present

## 2024-01-27 DIAGNOSIS — F4312 Post-traumatic stress disorder, chronic: Secondary | ICD-10-CM | POA: Diagnosis not present

## 2024-02-01 DIAGNOSIS — F4312 Post-traumatic stress disorder, chronic: Secondary | ICD-10-CM | POA: Diagnosis not present

## 2024-02-08 DIAGNOSIS — F4312 Post-traumatic stress disorder, chronic: Secondary | ICD-10-CM | POA: Diagnosis not present
